# Patient Record
Sex: Female | Born: 1935 | Race: Black or African American | Hispanic: No | Marital: Married | Smoking: Never smoker
Health system: Southern US, Community
[De-identification: ages and names within clinical notes are randomized; demographics above are authoritative.]

## PROBLEM LIST (undated history)

## (undated) ENCOUNTER — Emergency Department (HOSPITAL_COMMUNITY): Admission: EM | Discharge status: Absent without leave | Payer: Self-pay | Source: Home / Self Care

## (undated) DIAGNOSIS — E119 Type 2 diabetes mellitus without complications: Secondary | ICD-10-CM

## (undated) DIAGNOSIS — R0609 Other forms of dyspnea: Secondary | ICD-10-CM

## (undated) DIAGNOSIS — G4733 Obstructive sleep apnea (adult) (pediatric): Secondary | ICD-10-CM

## (undated) DIAGNOSIS — I878 Other specified disorders of veins: Secondary | ICD-10-CM

## (undated) DIAGNOSIS — M1990 Unspecified osteoarthritis, unspecified site: Secondary | ICD-10-CM

## (undated) DIAGNOSIS — R011 Cardiac murmur, unspecified: Secondary | ICD-10-CM

## (undated) DIAGNOSIS — N183 Chronic kidney disease, stage 3 unspecified: Secondary | ICD-10-CM

## (undated) DIAGNOSIS — Z8739 Personal history of other diseases of the musculoskeletal system and connective tissue: Secondary | ICD-10-CM

## (undated) DIAGNOSIS — I5032 Chronic diastolic (congestive) heart failure: Secondary | ICD-10-CM

## (undated) DIAGNOSIS — I421 Obstructive hypertrophic cardiomyopathy: Secondary | ICD-10-CM

## (undated) DIAGNOSIS — R600 Localized edema: Secondary | ICD-10-CM

## (undated) DIAGNOSIS — I10 Essential (primary) hypertension: Secondary | ICD-10-CM

## (undated) DIAGNOSIS — E785 Hyperlipidemia, unspecified: Secondary | ICD-10-CM

## (undated) DIAGNOSIS — Z8719 Personal history of other diseases of the digestive system: Secondary | ICD-10-CM

## (undated) DIAGNOSIS — Z9989 Dependence on other enabling machines and devices: Secondary | ICD-10-CM

## (undated) DIAGNOSIS — Z86718 Personal history of other venous thrombosis and embolism: Secondary | ICD-10-CM

## (undated) DIAGNOSIS — I480 Paroxysmal atrial fibrillation: Secondary | ICD-10-CM

## (undated) DIAGNOSIS — I351 Nonrheumatic aortic (valve) insufficiency: Secondary | ICD-10-CM

## (undated) DIAGNOSIS — T148XXA Other injury of unspecified body region, initial encounter: Secondary | ICD-10-CM

## (undated) DIAGNOSIS — I272 Pulmonary hypertension, unspecified: Secondary | ICD-10-CM

## (undated) DIAGNOSIS — G459 Transient cerebral ischemic attack, unspecified: Secondary | ICD-10-CM

## (undated) DIAGNOSIS — I639 Cerebral infarction, unspecified: Secondary | ICD-10-CM

## (undated) DIAGNOSIS — K219 Gastro-esophageal reflux disease without esophagitis: Secondary | ICD-10-CM

## (undated) HISTORY — DX: Other forms of dyspnea: R06.09

## (undated) HISTORY — PX: TRANSTHORACIC ECHOCARDIOGRAM: SHX275

## (undated) HISTORY — PX: EYE SURGERY: SHX253

## (undated) HISTORY — PX: TOTAL KNEE ARTHROPLASTY: SHX125

## (undated) HISTORY — PX: JOINT REPLACEMENT: SHX530

## (undated) HISTORY — PX: CATARACT EXTRACTION W/ INTRAOCULAR LENS IMPLANT: SHX1309

## (undated) HISTORY — DX: Transient cerebral ischemic attack, unspecified: G45.9

---

## 1969-03-16 HISTORY — PX: TUMOR EXCISION: SHX421

## 1997-11-19 ENCOUNTER — Emergency Department (HOSPITAL_COMMUNITY): Admission: EM | Admit: 1997-11-19 | Discharge: 1997-11-19 | Payer: Self-pay | Admitting: Emergency Medicine

## 1997-11-26 ENCOUNTER — Encounter: Admission: RE | Admit: 1997-11-26 | Discharge: 1998-02-24 | Payer: Self-pay | Admitting: Orthopedic Surgery

## 1998-05-26 ENCOUNTER — Encounter: Payer: Self-pay | Admitting: Emergency Medicine

## 1998-05-26 ENCOUNTER — Emergency Department (HOSPITAL_COMMUNITY): Admission: EM | Admit: 1998-05-26 | Discharge: 1998-05-26 | Payer: Self-pay | Admitting: Emergency Medicine

## 1999-02-20 ENCOUNTER — Other Ambulatory Visit: Admission: RE | Admit: 1999-02-20 | Discharge: 1999-02-20 | Payer: Self-pay | Admitting: Obstetrics & Gynecology

## 1999-04-05 ENCOUNTER — Encounter (INDEPENDENT_AMBULATORY_CARE_PROVIDER_SITE_OTHER): Payer: Self-pay

## 1999-04-05 ENCOUNTER — Other Ambulatory Visit: Admission: RE | Admit: 1999-04-05 | Discharge: 1999-04-05 | Payer: Self-pay | Admitting: Obstetrics & Gynecology

## 1999-04-13 ENCOUNTER — Encounter (INDEPENDENT_AMBULATORY_CARE_PROVIDER_SITE_OTHER): Payer: Self-pay

## 1999-04-13 ENCOUNTER — Ambulatory Visit (HOSPITAL_COMMUNITY): Admission: RE | Admit: 1999-04-13 | Discharge: 1999-04-13 | Payer: Self-pay | Admitting: Obstetrics & Gynecology

## 2000-07-22 ENCOUNTER — Encounter: Payer: Self-pay | Admitting: Internal Medicine

## 2000-07-22 ENCOUNTER — Encounter: Admission: RE | Admit: 2000-07-22 | Discharge: 2000-07-22 | Payer: Self-pay | Admitting: Internal Medicine

## 2000-07-28 ENCOUNTER — Emergency Department (HOSPITAL_COMMUNITY): Admission: EM | Admit: 2000-07-28 | Discharge: 2000-07-28 | Payer: Self-pay | Admitting: Emergency Medicine

## 2000-07-28 ENCOUNTER — Encounter: Payer: Self-pay | Admitting: Emergency Medicine

## 2001-08-12 ENCOUNTER — Encounter: Payer: Self-pay | Admitting: Internal Medicine

## 2001-08-12 ENCOUNTER — Encounter: Admission: RE | Admit: 2001-08-12 | Discharge: 2001-08-12 | Payer: Self-pay | Admitting: Internal Medicine

## 2001-10-23 ENCOUNTER — Encounter: Admission: RE | Admit: 2001-10-23 | Discharge: 2002-01-21 | Payer: Self-pay | Admitting: Internal Medicine

## 2001-12-10 ENCOUNTER — Encounter (HOSPITAL_BASED_OUTPATIENT_CLINIC_OR_DEPARTMENT_OTHER): Admission: RE | Admit: 2001-12-10 | Discharge: 2002-03-10 | Payer: Self-pay | Admitting: Internal Medicine

## 2002-02-27 ENCOUNTER — Encounter: Payer: Self-pay | Admitting: Orthopedic Surgery

## 2002-02-27 ENCOUNTER — Encounter: Admission: RE | Admit: 2002-02-27 | Discharge: 2002-02-27 | Payer: Self-pay | Admitting: Orthopedic Surgery

## 2002-03-12 ENCOUNTER — Encounter: Admission: RE | Admit: 2002-03-12 | Discharge: 2002-03-12 | Payer: Self-pay | Admitting: Orthopedic Surgery

## 2002-03-12 ENCOUNTER — Encounter: Payer: Self-pay | Admitting: Orthopedic Surgery

## 2002-03-16 ENCOUNTER — Encounter (HOSPITAL_BASED_OUTPATIENT_CLINIC_OR_DEPARTMENT_OTHER): Admission: RE | Admit: 2002-03-16 | Discharge: 2002-03-27 | Payer: Self-pay | Admitting: Internal Medicine

## 2002-03-26 ENCOUNTER — Encounter: Payer: Self-pay | Admitting: Orthopedic Surgery

## 2002-03-26 ENCOUNTER — Encounter: Admission: RE | Admit: 2002-03-26 | Discharge: 2002-03-26 | Payer: Self-pay | Admitting: Orthopedic Surgery

## 2002-06-22 ENCOUNTER — Encounter (HOSPITAL_BASED_OUTPATIENT_CLINIC_OR_DEPARTMENT_OTHER): Admission: RE | Admit: 2002-06-22 | Discharge: 2002-09-20 | Payer: Self-pay | Admitting: Internal Medicine

## 2002-09-28 ENCOUNTER — Encounter (HOSPITAL_BASED_OUTPATIENT_CLINIC_OR_DEPARTMENT_OTHER): Admission: RE | Admit: 2002-09-28 | Discharge: 2002-12-27 | Payer: Self-pay | Admitting: Internal Medicine

## 2002-12-30 ENCOUNTER — Encounter (HOSPITAL_BASED_OUTPATIENT_CLINIC_OR_DEPARTMENT_OTHER): Admission: RE | Admit: 2002-12-30 | Discharge: 2003-01-19 | Payer: Self-pay | Admitting: Internal Medicine

## 2003-04-22 ENCOUNTER — Encounter (HOSPITAL_BASED_OUTPATIENT_CLINIC_OR_DEPARTMENT_OTHER): Admission: RE | Admit: 2003-04-22 | Discharge: 2003-05-04 | Payer: Self-pay | Admitting: Internal Medicine

## 2003-04-23 ENCOUNTER — Other Ambulatory Visit: Admission: RE | Admit: 2003-04-23 | Discharge: 2003-04-23 | Payer: Self-pay | Admitting: Obstetrics and Gynecology

## 2003-05-11 ENCOUNTER — Ambulatory Visit (HOSPITAL_COMMUNITY): Admission: RE | Admit: 2003-05-11 | Discharge: 2003-05-11 | Payer: Self-pay | Admitting: Otolaryngology

## 2003-05-22 ENCOUNTER — Ambulatory Visit (HOSPITAL_COMMUNITY): Admission: RE | Admit: 2003-05-22 | Discharge: 2003-05-22 | Payer: Self-pay | Admitting: Obstetrics and Gynecology

## 2003-06-19 ENCOUNTER — Emergency Department (HOSPITAL_COMMUNITY): Admission: AD | Admit: 2003-06-19 | Discharge: 2003-06-19 | Payer: Self-pay | Admitting: Internal Medicine

## 2003-08-05 ENCOUNTER — Encounter (HOSPITAL_BASED_OUTPATIENT_CLINIC_OR_DEPARTMENT_OTHER): Admission: RE | Admit: 2003-08-05 | Discharge: 2003-08-17 | Payer: Self-pay | Admitting: Internal Medicine

## 2004-01-19 HISTORY — PX: CARDIAC CATHETERIZATION: SHX172

## 2005-05-25 ENCOUNTER — Encounter: Admission: RE | Admit: 2005-05-25 | Discharge: 2005-05-25 | Payer: Self-pay | Admitting: Orthopaedic Surgery

## 2005-06-13 ENCOUNTER — Encounter (INDEPENDENT_AMBULATORY_CARE_PROVIDER_SITE_OTHER): Payer: Self-pay | Admitting: Specialist

## 2005-06-13 ENCOUNTER — Ambulatory Visit (HOSPITAL_COMMUNITY): Admission: RE | Admit: 2005-06-13 | Discharge: 2005-06-13 | Payer: Self-pay | Admitting: Obstetrics and Gynecology

## 2006-01-14 ENCOUNTER — Emergency Department (HOSPITAL_COMMUNITY): Admission: EM | Admit: 2006-01-14 | Discharge: 2006-01-14 | Payer: Self-pay | Admitting: Emergency Medicine

## 2006-01-15 ENCOUNTER — Ambulatory Visit (HOSPITAL_COMMUNITY): Admission: RE | Admit: 2006-01-15 | Discharge: 2006-01-15 | Payer: Self-pay | Admitting: Emergency Medicine

## 2006-01-15 ENCOUNTER — Encounter: Payer: Self-pay | Admitting: Vascular Surgery

## 2006-08-02 ENCOUNTER — Ambulatory Visit (HOSPITAL_COMMUNITY): Admission: RE | Admit: 2006-08-02 | Discharge: 2006-08-02 | Payer: Self-pay | Admitting: Orthopedic Surgery

## 2007-01-31 ENCOUNTER — Other Ambulatory Visit: Admission: RE | Admit: 2007-01-31 | Discharge: 2007-01-31 | Payer: Self-pay | Admitting: Obstetrics and Gynecology

## 2008-11-22 ENCOUNTER — Encounter: Admission: RE | Admit: 2008-11-22 | Discharge: 2008-11-22 | Payer: Self-pay | Admitting: Specialist

## 2009-12-03 ENCOUNTER — Observation Stay (HOSPITAL_COMMUNITY): Admission: EM | Admit: 2009-12-03 | Discharge: 2009-12-03 | Payer: Self-pay | Admitting: Emergency Medicine

## 2009-12-03 ENCOUNTER — Encounter (INDEPENDENT_AMBULATORY_CARE_PROVIDER_SITE_OTHER): Payer: Self-pay | Admitting: Emergency Medicine

## 2009-12-03 ENCOUNTER — Ambulatory Visit: Payer: Self-pay | Admitting: Vascular Surgery

## 2010-01-04 ENCOUNTER — Emergency Department (HOSPITAL_COMMUNITY): Admission: EM | Admit: 2010-01-04 | Discharge: 2010-01-05 | Payer: Self-pay | Admitting: Emergency Medicine

## 2010-08-06 ENCOUNTER — Encounter: Payer: Self-pay | Admitting: Orthopedic Surgery

## 2010-08-07 ENCOUNTER — Encounter: Payer: Self-pay | Admitting: Orthopedic Surgery

## 2010-10-01 LAB — CBC
HCT: 36.5 % (ref 36.0–46.0)
Hemoglobin: 12.1 g/dL (ref 12.0–15.0)
MCH: 29.2 pg (ref 26.0–34.0)
MCHC: 33.1 g/dL (ref 30.0–36.0)
MCV: 88.2 fL (ref 78.0–100.0)
Platelets: 187 10*3/uL (ref 150–400)
RBC: 4.14 MIL/uL (ref 3.87–5.11)
RDW: 15.6 % — ABNORMAL HIGH (ref 11.5–15.5)
WBC: 15.3 10*3/uL — ABNORMAL HIGH (ref 4.0–10.5)

## 2010-10-01 LAB — URINE CULTURE: Colony Count: 85000

## 2010-10-01 LAB — DIFFERENTIAL
Basophils Absolute: 0 10*3/uL (ref 0.0–0.1)
Basophils Relative: 0 % (ref 0–1)
Eosinophils Absolute: 0.3 10*3/uL (ref 0.0–0.7)
Eosinophils Relative: 2 % (ref 0–5)
Lymphocytes Relative: 6 % — ABNORMAL LOW (ref 12–46)
Lymphs Abs: 0.9 10*3/uL (ref 0.7–4.0)
Monocytes Absolute: 0.4 10*3/uL (ref 0.1–1.0)
Monocytes Relative: 2 % — ABNORMAL LOW (ref 3–12)
Neutro Abs: 13.7 10*3/uL — ABNORMAL HIGH (ref 1.7–7.7)
Neutrophils Relative %: 90 % — ABNORMAL HIGH (ref 43–77)

## 2010-10-01 LAB — BASIC METABOLIC PANEL
BUN: 18 mg/dL (ref 6–23)
CO2: 29 mEq/L (ref 19–32)
Calcium: 8.9 mg/dL (ref 8.4–10.5)
Chloride: 104 mEq/L (ref 96–112)
Creatinine, Ser: 1.41 mg/dL — ABNORMAL HIGH (ref 0.4–1.2)
GFR calc Af Amer: 44 mL/min — ABNORMAL LOW (ref >60)
GFR calc non Af Amer: 36 mL/min — ABNORMAL LOW (ref >60)
Glucose, Bld: 201 mg/dL — ABNORMAL HIGH (ref 70–99)
Potassium: 3.7 mEq/L (ref 3.5–5.1)
Sodium: 141 mEq/L (ref 135–145)

## 2010-10-01 LAB — POCT CARDIAC MARKERS
CKMB, poc: 2.3 ng/mL (ref 1.0–8.0)
Myoglobin, poc: 188 ng/mL (ref 12–200)
Troponin i, poc: <0.05 ng/mL (ref 0.00–0.09)

## 2010-10-01 LAB — URINALYSIS, ROUTINE W REFLEX MICROSCOPIC
Bilirubin Urine: NEGATIVE
Glucose, UA: NEGATIVE mg/dL
Ketones, ur: NEGATIVE mg/dL
Nitrite: NEGATIVE
Protein, ur: 100 mg/dL — AB
Specific Gravity, Urine: 1.01 (ref 1.005–1.030)
Urobilinogen, UA: 1 mg/dL (ref 0.0–1.0)
pH: 6.5 (ref 5.0–8.0)

## 2010-10-01 LAB — URINE MICROSCOPIC-ADD ON

## 2010-10-01 LAB — BRAIN NATRIURETIC PEPTIDE: Pro B Natriuretic peptide (BNP): 373 pg/mL — ABNORMAL HIGH (ref 0.0–100.0)

## 2010-10-02 LAB — BASIC METABOLIC PANEL
BUN: 19 mg/dL (ref 6–23)
CO2: 32 mEq/L (ref 19–32)
Calcium: 9.1 mg/dL (ref 8.4–10.5)
Chloride: 102 mEq/L (ref 96–112)
Creatinine, Ser: 1.26 mg/dL — ABNORMAL HIGH (ref 0.4–1.2)
GFR calc Af Amer: 50 mL/min — ABNORMAL LOW (ref >60)
GFR calc non Af Amer: 42 mL/min — ABNORMAL LOW (ref >60)
Glucose, Bld: 125 mg/dL — ABNORMAL HIGH (ref 70–99)
Potassium: 4.1 mEq/L (ref 3.5–5.1)
Sodium: 139 mEq/L (ref 135–145)

## 2010-10-02 LAB — CBC
HCT: 40.4 % (ref 36.0–46.0)
Hemoglobin: 13.4 g/dL (ref 12.0–15.0)
MCHC: 33.2 g/dL (ref 30.0–36.0)
MCV: 86.7 fL (ref 78.0–100.0)
Platelets: 260 10*3/uL (ref 150–400)
RBC: 4.66 MIL/uL (ref 3.87–5.11)
RDW: 15.5 % (ref 11.5–15.5)
WBC: 9.1 10*3/uL (ref 4.0–10.5)

## 2010-10-02 LAB — DIFFERENTIAL
Basophils Absolute: 0 10*3/uL (ref 0.0–0.1)
Basophils Relative: 0 % (ref 0–1)
Eosinophils Absolute: 0.2 10*3/uL (ref 0.0–0.7)
Eosinophils Relative: 3 % (ref 0–5)
Lymphocytes Relative: 19 % (ref 12–46)
Lymphs Abs: 1.7 10*3/uL (ref 0.7–4.0)
Monocytes Absolute: 0.7 10*3/uL (ref 0.1–1.0)
Monocytes Relative: 8 % (ref 3–12)
Neutro Abs: 6.4 10*3/uL (ref 1.7–7.7)
Neutrophils Relative %: 70 % (ref 43–77)

## 2010-10-02 LAB — APTT: aPTT: 27 seconds (ref 24–37)

## 2010-10-02 LAB — PROTIME-INR
INR: 1.12 (ref 0.00–1.49)
Prothrombin Time: 14.3 seconds (ref 11.6–15.2)

## 2010-11-30 DIAGNOSIS — R0600 Dyspnea, unspecified: Secondary | ICD-10-CM

## 2010-11-30 DIAGNOSIS — R0609 Other forms of dyspnea: Secondary | ICD-10-CM

## 2010-11-30 HISTORY — DX: Dyspnea, unspecified: R06.00

## 2010-11-30 HISTORY — DX: Other forms of dyspnea: R06.09

## 2010-12-01 NOTE — H&P (Signed)
[name], [name]                 ACCOUNT NO.:  [id number]   MEDICAL RECORD NO.:  [id number]          PATIENT TYPE:  AMB   LOCATION:  [facility]C                           FACILITY:  [facility]   PHYSICIAN:  James A. Ashley Royalty, M.D.DATE OF BIRTH:  1934/04/10   DATE OF ADMISSION:  06/13/2005  DATE OF DISCHARGE:                                HISTORY & PHYSICAL   HISTORY OF PRESENT ILLNESS:  The patient is a 77 year old black female with  a history of endometrial hyperplasia and an endometrial polyp.  The patient  was scheduled to have dilatation and curettage and hysteroscopy on or about  July 2005 but had to cancel due to heart problems.  Her internal medicine  physician is Dr. Barbee Shropshire.  Other medical problems include diabetes mellitus  type 2, hypertension, and orthopedic issues including stenosis in the L4-5  region. She states her cardiac issues are stable and she has been cleared  for surgery.   MEDICATIONS:  1.  Amaryl.  2.  Norvasc 5 mg daily.  3.  Hyzaar 100/25 one daily.  4.  Furosemide 40 mg one-half tablet daily.  5.  Potassium.  6.  Toprol-XL 50 mg daily.  7.  Nexium 40 mg daily.  8.  Bayer aspirin 325 mg daily.   PAST MEDICAL HISTORY:  As above.   PAST SURGICAL HISTORY:  Three knee replacements.   ALLERGIES:  None disclosed.   FAMILY HISTORY:  Positive for hypertension and diabetes.   SOCIAL HISTORY:  The patient denies use of tobacco or alcohol.   REVIEW OF SYSTEMS:  Noncontributory.   PHYSICAL EXAMINATION:  GENERAL:  Well-developed, well-nourished, pleasant  black female in no acute distress.  VITAL SIGNS:  Afebrile.  Vital signs stable.  CHEST:  Lungs are clear.  CARDIAC:  Regular rate and rhythm.  ABDOMEN:  Soft and nontender without masses or organomegaly.  Bowel sounds  active.  MUSCULOSKELETAL:  The patient has limited range of motion in the right lower  extremity.  PELVIC:  (Seen May 29, 2005).  External genitalia within normal limits.  Vagina and cervix  without mass lesions.  Bimanual examination reveals the  uterus to be approximately 6 x 4 x 4 cm.  There is a left adnexal mass felt  to represent a pedunculated fibroid.   ACCESSORY FINDINGS:  Ultrasound May 06, 2003 revealed a solid mass in  the left adnexa consistent with a pedunculated fibroid.   IMPRESSION:  1.  Probable fibroid uterus.  2.  Endometrial polyp.  3.  History of endometrial hyperplasia.  4.  Diabetes mellitus, type 2.  5.  Hypertension.  6.  Stenosis in the L4-5 region of the spine.  7.  History of heart problems, exact diagnosis unknown.  Currently under the      care of Dr. Barbee Shropshire.  The patient alleges that her heart is fine at      this time and she has been cleared for surgery.   PLAN:  1.  Diagnostic/operative hysteroscopy.  2.  Dilatation and curettage.   The risks, benefits, complications, and alternatives were fully discussed  with the patient.  She understands and accepts.  Questions invited and  answered.      James A. Ashley Royalty, M.D.  Electronically Signed     JAM/MEDQ  D:  06/13/2005  T:  06/13/2005  Job:  [id number]

## 2010-12-01 NOTE — Op Note (Signed)
NAMELUCIE, [name]                 ACCOUNT NO.:  [id number]   MEDICAL RECORD NO.:  [id number]          PATIENT TYPE:  AMB   LOCATION:  SDC                           FACILITY:  [facility]   PHYSICIAN:  James A. Ashley Royalty, M.D.DATE OF BIRTH:  April 05, 1934   DATE OF PROCEDURE:  06/13/2005  DATE OF DISCHARGE:                                 OPERATIVE REPORT   PREOPERATIVE DIAGNOSES:  1.  History of endometrial hyperplasia.  2.  Possible endometrial polyp.   POSTOPERATIVE DIAGNOSES:  1.  History of endometrial hyperplasia.  2.  Possible endometrial polyp.  3.  Pathology pending.   PROCEDURES:  1.  Diagnostic hysteroscopy.  2.  Dilatation and curettage.   SURGEON:  Rudy Jew. Ashley Royalty, M.D.   ANESTHESIA:  General.   ESTIMATED BLOOD LOSS:  Less than 25 mL.   COMPLICATIONS:  None.   PACKS AND DRAINS:  None.   PROCEDURE:  The patient was taken to the operating room and placed in the  dorsal supine position.  Her feet were placed in the appropriate stirrups  while she was awake in deference of her history of orthopedic problems.  She  was then given a general anesthetic and prepped and draped in the usual  manner for vaginal surgery.  A posterior weighted retractor was placed per  vagina.  The anterior lip of cervix was grasped with a single-tooth  tenaculum.  The uterus was gently sounded to approximately 8 cm.  The cervix  was then dilated to a size 29-French using Shawnie Pons dilators.  A resectoscope  was placed into the uterine cavity using sorbitol as distension medium.  The  tubal ostia were visualized bilaterally.  The anterior and posterior aspects  of the uterine cavity were noted to be clean.  There was no obvious evidence  of any fibroids or polyps.  There was a small strip of endometrium versus a  polyp noted free and the cavity, and this was removed and submitted  separately to pathology for histologic studies.  Appropriate photos were  obtained.   Attention was then turned to the  uterine curettage.  A medium-sized curette  was introduced into the uterine cavity and a four-quadrant technique was  initially used for curettage.  Then a therapeutic technique was used.  All  curettings were submitted to pathology for histologic studies.  There was  minimal tissue obtained.  At this point hemostasis was noted and the  procedure terminated.  There was a small cervical laceration on the  tenaculum site,  which was easily closed with an interrupted suture of 2-0 chromic catgut.  Hemostasis was noted and the procedure terminated.   The patient was taken to the recovery room in excellent condition.      James A. Ashley Royalty, M.D.  Electronically Signed     JAM/MEDQ  D:  06/13/2005  T:  06/13/2005  Job:  [id number]

## 2011-02-23 ENCOUNTER — Inpatient Hospital Stay (INDEPENDENT_AMBULATORY_CARE_PROVIDER_SITE_OTHER)
Admission: RE | Admit: 2011-02-23 | Discharge: 2011-02-23 | Disposition: A | Discharge status: Home / Self Care | Payer: Medicare Other | Source: Ambulatory Visit | Attending: Family Medicine | Admitting: Family Medicine

## 2011-02-23 DIAGNOSIS — I872 Venous insufficiency (chronic) (peripheral): Secondary | ICD-10-CM

## 2011-02-23 DIAGNOSIS — I83009 Varicose veins of unspecified lower extremity with ulcer of unspecified site: Secondary | ICD-10-CM

## 2011-03-05 ENCOUNTER — Encounter (HOSPITAL_BASED_OUTPATIENT_CLINIC_OR_DEPARTMENT_OTHER): Payer: Medicare Other | Attending: Internal Medicine

## 2011-03-05 DIAGNOSIS — K219 Gastro-esophageal reflux disease without esophagitis: Secondary | ICD-10-CM | POA: Insufficient documentation

## 2011-03-05 DIAGNOSIS — E119 Type 2 diabetes mellitus without complications: Secondary | ICD-10-CM | POA: Insufficient documentation

## 2011-03-05 DIAGNOSIS — I872 Venous insufficiency (chronic) (peripheral): Secondary | ICD-10-CM | POA: Insufficient documentation

## 2011-03-05 DIAGNOSIS — Z79899 Other long term (current) drug therapy: Secondary | ICD-10-CM | POA: Insufficient documentation

## 2011-03-05 DIAGNOSIS — Z86718 Personal history of other venous thrombosis and embolism: Secondary | ICD-10-CM | POA: Insufficient documentation

## 2011-03-05 DIAGNOSIS — I82509 Chronic embolism and thrombosis of unspecified deep veins of unspecified lower extremity: Secondary | ICD-10-CM | POA: Insufficient documentation

## 2011-03-05 DIAGNOSIS — L97809 Non-pressure chronic ulcer of other part of unspecified lower leg with unspecified severity: Secondary | ICD-10-CM | POA: Insufficient documentation

## 2011-03-06 NOTE — Progress Notes (Unsigned)
[facility]  NAMEJAHNAI, [name]                 ACCOUNT NO.:  [id number]  MEDICAL RECORD NO.:  [id number]      DATE OF BIRTH:  [birth year]  PHYSICIAN:  Jonelle Sports. Sidnee Gambrill, M.D.  VISIT DATE:  03/05/2011                                  OFFICE VISIT   HISTORY:  This 77 year old black female is seen for assistance with management of 3 blistered areas on the distal posterior aspect of the right lower extremity.  The patient apparently has had venous thrombosis in the past.  She is unsure as to whether that is a single extremity or both, but remarkably has been anticoagulated recently and has had no particular difficulties with edema or chronic venous insufficiency or with ulcerations.  Without background history, however, she has had some recent fluid retention in general and noticed that it seemed to focus more on the right lower extremity and she developed 3 weeping blistered areas on the posterior aspect of that extremity sort of in the Achilles tendon area.  She went to the emergency room and received some temporizing treatment and her primary physician then sent her here for our evaluation.  PAST MEDICAL HISTORY:  Operations include bilateral knee replacements 1988, 2000 and 2002; right cataract surgery in 2011.  Other hospitalizations have been in association with her recurrent DVT.  She has no known medicinal allergies.  Her regular medications include: 1. Amlodipine 5 mg daily. 2. Clonidine 0.2 mg b.i.d. 3. Furosemide 60 mg daily. 4. Fish oil 1200 mg twice daily. 5. Glimepiride 5 mg twice daily. 6. Omeprazole 20 mg daily. 7. Benicar 40 mg daily. 8. Silvadene ointment 1% which she had been putting on these ulcerated     areas twice daily and she also takes a dietary supplement on a     daily basis. 9. She currently is also on doxycycline 100 mg b.i.d., although I have     no evidence of a culture or culture results from a recent     evaluation.  She  apparently has 3 or 4 more days of that to     continue.  FAMILY HISTORY:  Not reviewed in detail, but apparently was negative for problems similar to what the patient is having at the present.  PERSONAL HISTORY:  The patient is married, lives with her husband is able to take care of her personal needs and requires no assistance with cooking, shopping and personal care, etc.  She does not smoke or use alcohol or recreational beverages.  She is currently not working.  REVIEW OF SYSTEMS:  The patient has had diabetes for some 15-20 years, obviously takes low-dose medication, checks her sugars about once daily and it runs approximately 130 mg/dL.  She is unaware of any diabetic kidney or eye disease, certainly has not been on renal dialysis.  She has no history of cancer, radiation or chemotherapy.  She does have some gastroesophageal reflux which is well controlled with her current medication.  She also has hypertension which likewise is said to be well controlled with medication and she has no history of GI bleeding.  No thyroid disorders.  No anemia or other blood disorders.  ADDENDUM:  The patient did have venous Dopplers when she was seen in [place] earlier  this month and had no evidence of new thrombotic problems in her venous system.  PHYSICAL EXAMINATION:  Blood pressure is 157/90, pulse is 72 and regular, respirations 20 and temperature 97.6.  This is a pleasant elderly slightly overweight black female, in no immediate distress, quite alert and cooperative and in no pain at the moment.  Her skin is warm and dry and a good turgor.  Her mucous membranes are moist and pink.  Her chest is grossly clear to auscultation.  Her heart tones are of good quality.  There is a soft S4 heard at the lower sternal area. Her abdomen is obese, but nontender and I can detect no organomegaly. Her extremities show no edema on the left with pulses easily palpable on the left approximately 2+  pitting edema to the knee and above on the right making difficult to palpate her pulse, but on handheld Doppler examination, she has a biphasic signal at both the dorsalis pedis and posterior tibial areas.  On the posterior aspect of that lower extremity, there are several areas, where there have been previous blisters, two of those are partially epithelialized.  The third measures 2.0 x 1.6 x 0.1 cm.  There is no visible drainage, no odor, no evidence of infection.  IMPRESSION:  Deep vein thrombosis, chronic and remote, with acute stasis ulceration of the posterior right lower extremity.  DISPOSITION: 1. The patients' wounds require no debridement. 2. They are treated today with an application of silver collagen and     that extremities placed in an Unna wrap to the knee.  The patient is educated in how to deal with Unna boot and when it comes to bathing an issue such as that and understands that we do not want it removed prior to her visit here in 1 week.  She is advised that should it slip down, she can call and come in and be rewrapped at any time. Meanwhile, as stated above her next visit here will be in 1 week.          ______________________________ Jonelle Sports. Cheryll Cockayne, M.D.     RES/MEDQ  D:  03/05/2011  T:  03/06/2011  Job:  [id number]

## 2011-03-21 ENCOUNTER — Encounter (HOSPITAL_BASED_OUTPATIENT_CLINIC_OR_DEPARTMENT_OTHER): Payer: Medicare Other | Attending: Internal Medicine

## 2011-03-21 DIAGNOSIS — L97809 Non-pressure chronic ulcer of other part of unspecified lower leg with unspecified severity: Secondary | ICD-10-CM | POA: Insufficient documentation

## 2011-03-21 DIAGNOSIS — I872 Venous insufficiency (chronic) (peripheral): Secondary | ICD-10-CM | POA: Insufficient documentation

## 2011-03-21 DIAGNOSIS — K219 Gastro-esophageal reflux disease without esophagitis: Secondary | ICD-10-CM | POA: Insufficient documentation

## 2011-03-21 DIAGNOSIS — I82509 Chronic embolism and thrombosis of unspecified deep veins of unspecified lower extremity: Secondary | ICD-10-CM | POA: Insufficient documentation

## 2011-03-21 DIAGNOSIS — Z79899 Other long term (current) drug therapy: Secondary | ICD-10-CM | POA: Insufficient documentation

## 2011-03-21 DIAGNOSIS — Z86718 Personal history of other venous thrombosis and embolism: Secondary | ICD-10-CM | POA: Insufficient documentation

## 2011-03-21 DIAGNOSIS — E119 Type 2 diabetes mellitus without complications: Secondary | ICD-10-CM | POA: Insufficient documentation

## 2011-05-14 ENCOUNTER — Encounter (HOSPITAL_BASED_OUTPATIENT_CLINIC_OR_DEPARTMENT_OTHER): Payer: Medicare Other | Attending: Internal Medicine

## 2011-05-14 DIAGNOSIS — L97809 Non-pressure chronic ulcer of other part of unspecified lower leg with unspecified severity: Secondary | ICD-10-CM | POA: Insufficient documentation

## 2011-05-14 DIAGNOSIS — Z86718 Personal history of other venous thrombosis and embolism: Secondary | ICD-10-CM | POA: Insufficient documentation

## 2011-05-14 DIAGNOSIS — E119 Type 2 diabetes mellitus without complications: Secondary | ICD-10-CM | POA: Insufficient documentation

## 2011-05-14 DIAGNOSIS — K219 Gastro-esophageal reflux disease without esophagitis: Secondary | ICD-10-CM | POA: Insufficient documentation

## 2011-05-14 DIAGNOSIS — I10 Essential (primary) hypertension: Secondary | ICD-10-CM | POA: Insufficient documentation

## 2011-05-14 DIAGNOSIS — Z79899 Other long term (current) drug therapy: Secondary | ICD-10-CM | POA: Insufficient documentation

## 2011-05-14 DIAGNOSIS — I872 Venous insufficiency (chronic) (peripheral): Secondary | ICD-10-CM | POA: Insufficient documentation

## 2011-05-21 ENCOUNTER — Encounter (HOSPITAL_BASED_OUTPATIENT_CLINIC_OR_DEPARTMENT_OTHER): Payer: Medicare Other | Attending: Internal Medicine

## 2011-05-21 DIAGNOSIS — I10 Essential (primary) hypertension: Secondary | ICD-10-CM | POA: Insufficient documentation

## 2011-05-21 DIAGNOSIS — L97809 Non-pressure chronic ulcer of other part of unspecified lower leg with unspecified severity: Secondary | ICD-10-CM | POA: Insufficient documentation

## 2011-05-21 DIAGNOSIS — E119 Type 2 diabetes mellitus without complications: Secondary | ICD-10-CM | POA: Insufficient documentation

## 2011-05-21 DIAGNOSIS — K219 Gastro-esophageal reflux disease without esophagitis: Secondary | ICD-10-CM | POA: Insufficient documentation

## 2011-05-21 DIAGNOSIS — Z79899 Other long term (current) drug therapy: Secondary | ICD-10-CM | POA: Insufficient documentation

## 2011-05-21 DIAGNOSIS — I872 Venous insufficiency (chronic) (peripheral): Secondary | ICD-10-CM | POA: Insufficient documentation

## 2011-05-21 DIAGNOSIS — Z86718 Personal history of other venous thrombosis and embolism: Secondary | ICD-10-CM | POA: Insufficient documentation

## 2011-06-25 ENCOUNTER — Encounter (HOSPITAL_BASED_OUTPATIENT_CLINIC_OR_DEPARTMENT_OTHER): Payer: Medicare Other | Attending: Internal Medicine

## 2011-06-25 ENCOUNTER — Ambulatory Visit (HOSPITAL_BASED_OUTPATIENT_CLINIC_OR_DEPARTMENT_OTHER): Payer: Medicare Other

## 2011-06-25 DIAGNOSIS — Z79899 Other long term (current) drug therapy: Secondary | ICD-10-CM | POA: Insufficient documentation

## 2011-06-25 DIAGNOSIS — I872 Venous insufficiency (chronic) (peripheral): Secondary | ICD-10-CM | POA: Insufficient documentation

## 2011-06-25 DIAGNOSIS — L97809 Non-pressure chronic ulcer of other part of unspecified lower leg with unspecified severity: Secondary | ICD-10-CM | POA: Insufficient documentation

## 2011-06-25 DIAGNOSIS — R609 Edema, unspecified: Secondary | ICD-10-CM | POA: Insufficient documentation

## 2011-07-19 ENCOUNTER — Telehealth (HOSPITAL_COMMUNITY): Payer: Self-pay | Admitting: *Deleted

## 2011-07-19 ENCOUNTER — Emergency Department (INDEPENDENT_AMBULATORY_CARE_PROVIDER_SITE_OTHER)
Admission: EM | Admit: 2011-07-19 | Discharge: 2011-07-19 | Disposition: A | Discharge status: Home / Self Care | Payer: Medicare Other | Source: Home / Self Care | Attending: Family Medicine | Admitting: Family Medicine

## 2011-07-19 DIAGNOSIS — J069 Acute upper respiratory infection, unspecified: Secondary | ICD-10-CM

## 2011-07-19 DIAGNOSIS — R609 Edema, unspecified: Secondary | ICD-10-CM

## 2011-07-19 DIAGNOSIS — R600 Localized edema: Secondary | ICD-10-CM

## 2011-07-19 HISTORY — DX: Essential (primary) hypertension: I10

## 2011-07-19 MED ORDER — AZITHROMYCIN 250 MG PO TABS: 250.0000 mg | ORAL_TABLET | Freq: Every day | ORAL | Status: AC

## 2011-07-19 MED ORDER — BENZONATATE 100 MG PO CAPS: 100.0000 mg | ORAL_CAPSULE | Freq: Three times a day (TID) | ORAL | Status: AC

## 2011-07-19 NOTE — ED Notes (Signed)
C/o sneezing and productive cough of yellow sputum since 07/09/12.  Denies fever or other sx.  Also c/o redness and swelling to rt lower leg for 1 week.  Pt has on-going hx of blisters/wounds to rt lower leg.  States she was discharged from wound center the first of December.  Reports in the last week she developed blister to posterior aspect of RLE and she developed a break in the skin to anterior aspect of RLE around where previous wound was.  Additionally she reports swelling and redness to RLE in the past week. Has appt with [facility] Center on Monday.

## 2011-07-19 NOTE — ED Provider Notes (Signed)
History     CSN: [id number]  Arrival date & time 07/19/11  0910   First MD Initiated Contact with Patient 07/19/11 1005      Chief Complaint  Patient presents with  . Leg Swelling  . Cough    (Consider location/radiation/quality/duration/timing/severity/associated sxs/prior treatment) Patient is a 78 y.o. female presenting with cough. The history is provided by the patient.  Cough This is a new problem. The current episode started more than 1 week ago. The problem occurs constantly. The problem has not changed since onset.The cough is non-productive. There has been no fever. Associated symptoms include rhinorrhea. Pertinent negatives include no chills, no sweats, no ear pain, no sore throat, no shortness of breath and no wheezing. She has tried nothing for the symptoms.  She also reports increased swelling of her right lower extrem. States she normally has swelling and takes lasix. She has recurring sores and is a patient of the wound clinic. She has appt Monday. She states she has been takng 40 mg of lasix daily with minimal relief.   Past Medical History  Diagnosis Date  . Diabetes mellitus   . Hypertension   . Gout   . High cholesterol   . DVT (deep venous thrombosis)     Past Surgical History  Procedure Date  . Total knee arthroplasty     History reviewed. No pertinent family history.  History  Substance Use Topics  . Smoking status: Never Smoker   . Smokeless tobacco: Not on file  . Alcohol Use: No    OB History    Grav Para Term Preterm Abortions TAB SAB Ect Mult Living                  Review of Systems  Constitutional: Negative for fever and chills.  HENT: Positive for rhinorrhea and postnasal drip. Negative for ear pain, nosebleeds, sore throat, neck pain and neck stiffness.   Respiratory: Positive for cough. Negative for shortness of breath and wheezing.   Gastrointestinal: Negative.   Skin: Negative.     Allergies  Review of patient's allergies  indicates no known allergies.  Home Medications   Current Outpatient Rx  Name Route Sig Dispense Refill  . ALLOPURINOL 300 MG PO TABS Oral Take 300 mg by mouth daily.      . ASPIRIN 81 MG PO TABS Oral Take 160 mg by mouth daily.      Marland Kitchen CLONIDINE HCL 0.2 MG PO TABS Oral Take 0.2 mg by mouth 2 (two) times daily.      . FUROSEMIDE 40 MG PO TABS Oral Take 40 mg by mouth daily.      Marland Kitchen GLIMEPIRIDE 4 MG PO TABS Oral Take 4 mg by mouth daily before breakfast.      . NABUMETONE 500 MG PO TABS Oral Take 500 mg by mouth 2 (two) times daily.      Marland Kitchen OLMESARTAN MEDOXOMIL-HCTZ 40-25 MG PO TABS Oral Take 1 tablet by mouth daily.      Marland Kitchen OMEPRAZOLE 20 MG PO CPDR Oral Take 20 mg by mouth daily.      Marland Kitchen SIMVASTATIN 40 MG PO TABS Oral Take 40 mg by mouth every evening.      . AZITHROMYCIN 250 MG PO TABS Oral Take 1 tablet (250 mg total) by mouth daily. Take first 2 tablets together, then 1 every day until finished. 6 tablet 0  . BENZONATATE 100 MG PO CAPS Oral Take 1 capsule (100 mg total) by mouth every 8 (  eight) hours. 21 capsule 0    BP 177/69  Pulse 82  Temp 98.2 F (36.8 C)  Resp 24  SpO2 98%  Physical Exam  Nursing note and vitals reviewed. Constitutional: She appears well-developed and well-nourished. No distress.  HENT:  Head: Normocephalic and atraumatic.  Mouth/Throat: Oropharynx is clear and moist.       Nasal congestion  Neck: Normal range of motion. Neck supple. No thyromegaly present.  Cardiovascular: Normal rate and regular rhythm.   Pulmonary/Chest: Breath sounds normal. No respiratory distress. She has no wheezes.       Congested cough  Musculoskeletal: She exhibits edema.       Slightly mor on the right lower extrem  Lymphadenopathy:    She has no cervical adenopathy.  Skin: Skin is warm and dry.    ED Course  Procedures (including critical care time)  Labs Reviewed - No data to display No results found.   1. URI (upper respiratory infection)   2. Edema of lower  extremity       MDM          Randa Spike, MD 07/19/11 [phone]

## 2011-07-19 NOTE — ED Notes (Signed)
Went in to discharge pt but not in room.  Went to lobby to look for her- Rams states pt left.  Called pt's home phone and left message to return our call.  Will hold pts chart to see if she calls back.

## 2011-07-23 ENCOUNTER — Encounter (HOSPITAL_BASED_OUTPATIENT_CLINIC_OR_DEPARTMENT_OTHER): Payer: Medicare Other | Attending: Internal Medicine

## 2011-07-23 DIAGNOSIS — I872 Venous insufficiency (chronic) (peripheral): Secondary | ICD-10-CM | POA: Insufficient documentation

## 2011-07-23 DIAGNOSIS — K219 Gastro-esophageal reflux disease without esophagitis: Secondary | ICD-10-CM | POA: Insufficient documentation

## 2011-07-23 DIAGNOSIS — Z79899 Other long term (current) drug therapy: Secondary | ICD-10-CM | POA: Insufficient documentation

## 2011-07-23 DIAGNOSIS — E119 Type 2 diabetes mellitus without complications: Secondary | ICD-10-CM | POA: Insufficient documentation

## 2011-07-23 DIAGNOSIS — Z86718 Personal history of other venous thrombosis and embolism: Secondary | ICD-10-CM | POA: Insufficient documentation

## 2011-07-23 DIAGNOSIS — I10 Essential (primary) hypertension: Secondary | ICD-10-CM | POA: Insufficient documentation

## 2011-07-23 DIAGNOSIS — L97809 Non-pressure chronic ulcer of other part of unspecified lower leg with unspecified severity: Secondary | ICD-10-CM | POA: Insufficient documentation

## 2011-07-30 ENCOUNTER — Encounter (HOSPITAL_BASED_OUTPATIENT_CLINIC_OR_DEPARTMENT_OTHER): Payer: Medicare Other

## 2011-07-30 ENCOUNTER — Ambulatory Visit (HOSPITAL_BASED_OUTPATIENT_CLINIC_OR_DEPARTMENT_OTHER): Payer: Medicare Other

## 2011-10-15 ENCOUNTER — Ambulatory Visit (INDEPENDENT_AMBULATORY_CARE_PROVIDER_SITE_OTHER): Payer: Medicare Other | Admitting: Internal Medicine

## 2011-10-15 ENCOUNTER — Encounter: Payer: Self-pay | Admitting: Internal Medicine

## 2011-10-15 VITALS — BP 160/84 | HR 83 | Temp 97.7°F | Ht 66.0 in | Wt 249.4 lb

## 2011-10-15 DIAGNOSIS — G4730 Sleep apnea, unspecified: Secondary | ICD-10-CM

## 2011-10-15 DIAGNOSIS — I272 Pulmonary hypertension, unspecified: Secondary | ICD-10-CM

## 2011-10-15 DIAGNOSIS — I2789 Other specified pulmonary heart diseases: Secondary | ICD-10-CM

## 2011-10-15 NOTE — Patient Instructions (Signed)
Prilosec(omeprazole)  20 mg take Take 30-60 min before first meal of the day and Pepcid ac  20 mg one at bedtime   Please see patient coordinator before you leave today  to schedule overnight pulse ox and a V/Q lung scan   GERD (REFLUX)  is an extremely common cause of respiratory symptoms, many times with no significant heartburn at all.    It can be treated with medication, but also with lifestyle changes including avoidance of late meals, excessive alcohol, smoking cessation, and avoid fatty foods, chocolate, peppermint, colas, red wine, and acidic juices such as orange juice.  NO MINT OR MENTHOL PRODUCTS SO NO COUGH DROPS  USE SUGARLESS CANDY INSTEAD (jolley ranchers or Stover's)  NO OIL BASED VITAMINS - use powdered substitutes.   Please schedule a follow up office visit in 2 weeks, sooner if needed

## 2011-10-15 NOTE — Progress Notes (Signed)
  Subjective:    Patient ID: [name], female    DOB: Sep 06, 1933 MRN: [id number]  HPI  31 yobf never with morbid obesity/ hbp/ dm with new onset sob Jan 2013 > referred 10/15/2011 to pulmonary clinic by Dr Renae Gloss with ? PAH   10/15/2011 1st pulmonary eval with wt down 50 lbs from peak of 300 with new indolent onset of doe x walking to car progressively worse since onset started abruptly assoc with leg swelling x August 2012 assoc with choking sensation while on cpap last titrated > 1 y prior to OV  Per pt. No cough or assoc cp or tightness or subjective wheeze.    Sleeping ok without nocturnal  or early am exacerbation  of respiratory  c/o's or need for noct saba. Also denies any obvious fluctuation of symptoms with weather or environmental changes or other aggravating or alleviating factors except as outlined above   No h/o liver dz/ anorexigen use or rheumatologic dz    Review of Systems  Constitutional: Negative for fever, chills and unexpected weight change.  HENT: Negative for ear pain, nosebleeds, congestion, sore throat, rhinorrhea, sneezing, trouble swallowing, dental problem, voice change, postnasal drip and sinus pressure.   Eyes: Negative for visual disturbance.  Respiratory: Positive for shortness of breath. Negative for cough and choking.   Cardiovascular: Positive for leg swelling. Negative for chest pain.  Gastrointestinal: Negative for vomiting, abdominal pain and diarrhea.  Genitourinary: Negative for difficulty urinating.  Musculoskeletal: Positive for arthralgias.  Skin: Negative for rash.  Neurological: Negative for tremors, syncope and headaches.  Hematological: Does not bruise/bleed easily.       Objective:   Physical Exam Obese very hoarse amb bf nad  Wt 249 10/15/2011    HEENT: nl dentition, turbinates, and orophanx. Nl external ear canals without cough reflex   NECK :  without JVD/Nodes/TM/ nl carotid upstrokes bilaterally   LUNGS: no acc muscle use,  clear to A and P bilaterally without cough on insp or exp maneuvers   CV:  RRR  no s3 or murmur  slt increase P2, pitting edema elastic stocking  ABD:  soft and nontender with nl excursion in the supine position. No bruits or organomegaly, bowel sounds nl  MS:  warm without deformities, calf tenderness, cyanosis or clubbing  SKIN: warm and dry without lesions    NEURO:  alert, approp, no deficits     cxr 4/1 requested, not done    Assessment & Plan:

## 2011-10-17 DIAGNOSIS — G4730 Sleep apnea, unspecified: Secondary | ICD-10-CM | POA: Insufficient documentation

## 2011-10-17 NOTE — Assessment & Plan Note (Signed)
Noct choking may be related to GERD > try diet and h2 hs  Recheck ono on cpap/ no 02 ordered.

## 2011-10-17 NOTE — Assessment & Plan Note (Signed)
Most likely this will turn out to be related to elevated L ht pressures from diast chf but need to exclude TEPH with v/q and be sure she has adequate sats on her cpap

## 2011-10-18 ENCOUNTER — Other Ambulatory Visit: Payer: Self-pay | Admitting: Internal Medicine

## 2011-10-18 ENCOUNTER — Encounter (HOSPITAL_COMMUNITY)
Admission: RE | Admit: 2011-10-18 | Discharge: 2011-10-18 | Disposition: A | Discharge status: Home / Self Care | Payer: Medicare Other | Source: Ambulatory Visit | Attending: Internal Medicine | Admitting: Internal Medicine

## 2011-10-18 ENCOUNTER — Encounter: Payer: Self-pay | Admitting: Internal Medicine

## 2011-10-18 DIAGNOSIS — I272 Pulmonary hypertension, unspecified: Secondary | ICD-10-CM

## 2011-10-18 DIAGNOSIS — I517 Cardiomegaly: Secondary | ICD-10-CM | POA: Insufficient documentation

## 2011-10-18 DIAGNOSIS — R0602 Shortness of breath: Secondary | ICD-10-CM | POA: Insufficient documentation

## 2011-10-18 DIAGNOSIS — Z86718 Personal history of other venous thrombosis and embolism: Secondary | ICD-10-CM | POA: Insufficient documentation

## 2011-10-18 DIAGNOSIS — E119 Type 2 diabetes mellitus without complications: Secondary | ICD-10-CM | POA: Insufficient documentation

## 2011-10-18 MED ORDER — TECHNETIUM TO 99M ALBUMIN AGGREGATED
3.5000 | Freq: Once | INTRAVENOUS | Status: AC | PRN
  Administered 2011-10-18: 4 via INTRAVENOUS

## 2011-10-18 NOTE — Progress Notes (Signed)
Quick Note:  Spoke with pt and notified of results per Dr. Wert. Pt verbalized understanding and denied any questions.  ______ 

## 2011-10-19 ENCOUNTER — Encounter: Payer: Self-pay | Admitting: Internal Medicine

## 2011-10-22 ENCOUNTER — Telehealth: Payer: Self-pay | Admitting: Internal Medicine

## 2011-10-22 NOTE — Telephone Encounter (Signed)
Per MW- ONO reviewed and the pt does not require o2 with her CPAP. Spoke with the pt and notified her of this and she verbalized understanding.

## 2011-10-25 ENCOUNTER — Institutional Professional Consult (permissible substitution): Payer: Medicare Other | Admitting: Emergency Medicine

## 2011-10-30 ENCOUNTER — Encounter: Payer: Self-pay | Admitting: Internal Medicine

## 2011-10-31 ENCOUNTER — Encounter: Payer: Self-pay | Admitting: Internal Medicine

## 2011-10-31 ENCOUNTER — Ambulatory Visit (INDEPENDENT_AMBULATORY_CARE_PROVIDER_SITE_OTHER): Payer: Medicare Other | Admitting: Internal Medicine

## 2011-10-31 VITALS — BP 142/80 | HR 85 | Temp 97.9°F | Ht 64.0 in | Wt 238.6 lb

## 2011-10-31 DIAGNOSIS — I272 Pulmonary hypertension, unspecified: Secondary | ICD-10-CM

## 2011-10-31 DIAGNOSIS — I2789 Other specified pulmonary heart diseases: Secondary | ICD-10-CM

## 2011-10-31 DIAGNOSIS — G4730 Sleep apnea, unspecified: Secondary | ICD-10-CM

## 2011-10-31 NOTE — Progress Notes (Signed)
  Subjective:    Patient ID: [name], female    DOB: Mar 01, 1934    MRN: [id number]  HPI  3 yobf never smoker  with morbid obesity/ hbp/ dm with new onset sob Jan 2013 > referred 10/15/2011 to pulmonary clinic by Dr Renae Gloss with ? PAH   10/15/2011 1st pulmonary eval with wt down 50 lbs from peak of 300 with new indolent onset of doe x walking to car progressively worse since onset started abruptly assoc with leg swelling x August 2012 assoc with choking sensation while on cpap last titrated > 1 y prior to OV  Per pt. No cough or assoc cp or tightness or subjective wheeze.   rec Prilosec(omeprazole)  20 mg take Take 30-60 min before first meal of the day and Pepcid ac  20 mg one at bedtime  Please see patient coordinator before you leave today  to schedule overnight pulse ox and a V/Q lung scan > low prob GERD  10/31/2011 f/u ov/Zena Vitelli cc breathing better now doe across the parking lot ok which is an improvement and attributed to less fluid in legs, diuresis per cards. No cough. Actually doing great x not tol cpap as well ? Why. When wears cpap ono is fine per study done 10/16/11.  Sleeping ok without nocturnal  or early am exacerbation  of respiratory  c/o's or need for noct saba. Also denies any obvious fluctuation of symptoms with weather or environmental changes or other aggravating or alleviating factors except as outlined above   No h/o liver dz/ anorexigen use or rheumatologic dz          Objective:   Physical Exam Obese moderately   hoarse amb bf nad  Wt 249 10/15/2011 > 10/31/2011  238   HEENT: nl dentition, turbinates, and orophanx. Nl external ear canals without cough reflex   NECK :  without JVD/Nodes/TM/ nl carotid upstrokes bilaterally   LUNGS: no acc muscle use, clear to A and P bilaterally without cough on insp or exp maneuvers   CV:  RRR  no s3 or murmur  slt increase P2, pitting edema elastic stocking  ABD:  soft and nontender with nl excursion in the supine  position. No bruits or organomegaly, bowel sounds nl  MS:  warm without deformities, calf tenderness, cyanosis or clubbing     cxr 10/18/11 Stable mild cardiomegaly. No active lung disease.  V/Q 10/18/11 Low probability for pulmonary embolus.     Assessment & Plan:

## 2011-10-31 NOTE — Patient Instructions (Signed)
Getting the fluid  And wt off are the highest priority.  If continue to have problems with your cpap your heart doctor may recommend you see a sleep specialist.  If your heart doctor wants another opinion on treatment for pulmonary hypertension he can send you to Dr Delton Coombes but as long as breathing is improving I don't recommend any medication for this.

## 2011-11-01 NOTE — Assessment & Plan Note (Addendum)
-   Echo 04/11/11 [place] >> RA with borderline rve and mod PAH per [facility]    - 10/15/2011  Walked RA x 1 laps @ 185 ft each stopped due to sob and legs gave out but no desat    - Overnight pulse ox on cpap 10/16/2011 > neg    - V/Q  10/18/2011 >  wnl  I had an extended discussion with the patient today lasting 15 to 20 minutes of a 25 minute visit on the following issues:   As most pts we see in this practice with PAH, she does not have a primary pulmonary problem at all but rather suffering from the effects of chronically elevated L Ht pressures.  Main issue is making sure she's diuresed adequately and uses cpap consistently > refer to one of our sleep docs prn as presently she identifies Dr Tresa Endo as responsible for troubleshooting her cpap issues and presumably will continue to take care of this problem until / unless he directs her otherwise.   ADDENDUM Records indicate it was [place] then [place], not [place], and last note by [facility] requested our help with osa > referred to sleep. Since she is so much better with diuresis no need for [place], who doesn't do sleep, to see her at this point

## 2011-11-01 NOTE — Progress Notes (Signed)
Addended by: Sandrea Hughs B on: 11/01/2011 09:05 AM   Modules accepted: Orders

## 2011-11-19 ENCOUNTER — Institutional Professional Consult (permissible substitution): Payer: Medicare Other | Admitting: Pulmonary Disease

## 2011-12-03 ENCOUNTER — Encounter (HOSPITAL_BASED_OUTPATIENT_CLINIC_OR_DEPARTMENT_OTHER): Payer: Medicare Other | Attending: Internal Medicine

## 2011-12-03 DIAGNOSIS — I872 Venous insufficiency (chronic) (peripheral): Secondary | ICD-10-CM | POA: Insufficient documentation

## 2011-12-03 DIAGNOSIS — L97809 Non-pressure chronic ulcer of other part of unspecified lower leg with unspecified severity: Secondary | ICD-10-CM | POA: Insufficient documentation

## 2011-12-17 ENCOUNTER — Encounter (HOSPITAL_BASED_OUTPATIENT_CLINIC_OR_DEPARTMENT_OTHER): Payer: Medicare Other | Attending: Internal Medicine

## 2011-12-17 DIAGNOSIS — L97809 Non-pressure chronic ulcer of other part of unspecified lower leg with unspecified severity: Secondary | ICD-10-CM | POA: Insufficient documentation

## 2011-12-17 DIAGNOSIS — I10 Essential (primary) hypertension: Secondary | ICD-10-CM | POA: Insufficient documentation

## 2011-12-17 DIAGNOSIS — E119 Type 2 diabetes mellitus without complications: Secondary | ICD-10-CM | POA: Insufficient documentation

## 2011-12-17 NOTE — Progress Notes (Signed)
[facility]  NAME:  [name], [name]                 ACCOUNT NO.:  [id number]  MEDICAL RECORD NO.:  [id number]      DATE OF BIRTH:  05-28-1934  PHYSICIAN:  Wayland Denis, DO       VISIT DATE:  12/17/2011                                  OFFICE VISIT   The patient is a 78 year old female who has chronic venous insufficiency with an ulcer on the right anterior lower-third leg.  She has used Unna wraps over the last week with some improvement.  The area is granulating.  There does not appear to be any sign of infection.  It is pink in the peri-wound area.  It is intact.  She does have quite a bit of swelling on that foot compared to the other one, the right leg compared to the left.  She does have a compression stocking on the left, which seems to have helped quite a bit.  Her pupils are equal.  Extraocular muscles are intact.  No cervical lymphadenopathy.  Breathing is unlabored.  Heart is regular.  The wound is as described.  We will continue with the Unna boot and a Silvercel, and if it does not start to show improvement more quickly, then we will discuss the possibility of Oasis.     Wayland Denis, DO     CS/MEDQ  D:  12/17/2011  T:  12/17/2011  Job:  [id number]

## 2011-12-31 ENCOUNTER — Encounter (HOSPITAL_BASED_OUTPATIENT_CLINIC_OR_DEPARTMENT_OTHER): Payer: Medicare Other

## 2011-12-31 NOTE — Progress Notes (Signed)
[facility]  NAME:  [name], [name]                      ACCOUNT NO.:  MEDICAL RECORD NO.:  [id number]      DATE OF BIRTH:  [birth year]  PHYSICIAN:  Wayland Denis, DO       VISIT DATE:  12/31/2011                                  OFFICE VISIT   The patient is a 78 year old female who is here for a right anterior leg ulcer, which was getting much better and was being treated with thigh- high stockings over the last week and was essentially healed. Unfortunately, this week, she developed a very large blister on the back of her right leg, approximately 15 x 13 cm.  She is not sure what caused it, but noted that when she woke up one morning, there was a small bump that developed into a very a large bump over few days' time.  She came in and was treated with an Radio broadcast assistant.  It does not appear to be infected, but does seem to be filling back up again. No change in medications or social history.  On exam, she is alert, oriented, cooperative, not in any acute distress. She is pleasant. Pupils are equal. Extraocular muscles are intact.  No cervical lymphadenopathy.  Breathing is unlabored.  Heart is regular. Abdomen is soft.  Lower extremity edema present bilaterally, but no increase from last visit.  The blister was debrided from the superficial layer and we will place Silvadene on it with a wrap and see her back in 1 week.     Wayland Denis, DO     CS/MEDQ  D:  12/31/2011  T:  12/31/2011  Job:  [id number]

## 2012-01-01 LAB — PULMONARY FUNCTION TEST

## 2012-01-07 NOTE — Progress Notes (Signed)
[facility]  NAME:  [name], [name]                      ACCOUNT NO.:  MEDICAL RECORD NO.:  [id number]      DATE OF BIRTH:  01-02-1934  PHYSICIAN:  Wayland Denis, DO       VISIT DATE:  01/07/2012                                  OFFICE VISIT   The patient is a 78 year old female who is here for followup on her right lower extremity ulcer.  The original ulcer has healed, but she developed a blister on the back of her leg underneath her wrap from previously.  The last week, we used Silvadene and Profore Lite.  She had quite a bit of drainage in the end of the week, but overall has decreased swelling and the area does appear to be superficial and looks very similar to a superficial burn.  No change in medications or social history.  On exam, she is alert, oriented, cooperative, not in any acute distress. She is pleasant.  Pupils are equal.  Abdomen is soft.  Pulses are regular and strong.  Leg has decreased swelling from last week.  It does not appear to have any infection.  We will continue with the Silvadene and Profore Lite. We will also have it changed at home once during the week and see her back in 1 week.     Wayland Denis, DO     CS/MEDQ  D:  01/07/2012  T:  01/07/2012  Job:  [id number]

## 2012-01-14 ENCOUNTER — Encounter (HOSPITAL_BASED_OUTPATIENT_CLINIC_OR_DEPARTMENT_OTHER): Payer: Medicare Other | Attending: Plastic Surgery

## 2012-01-14 DIAGNOSIS — I872 Venous insufficiency (chronic) (peripheral): Secondary | ICD-10-CM | POA: Insufficient documentation

## 2012-01-14 DIAGNOSIS — L97809 Non-pressure chronic ulcer of other part of unspecified lower leg with unspecified severity: Secondary | ICD-10-CM | POA: Insufficient documentation

## 2012-01-15 NOTE — Progress Notes (Signed)
[facility]  NAME:  JAYMARIE, YEAKEL                 ACCOUNT NO.:  [id number]  MEDICAL RECORD NO.:  [id number]      DATE OF BIRTH:  Jul 08, 1934  PHYSICIAN:  Wayland Denis, DO       VISIT DATE:  01/14/2012                                  OFFICE VISIT   The patient is a 78 year old female who is here for followup on her right lower extremity ulcer.  Her original ulcer has actually completely healed.  The blistered area, she has been using Silvadene with a Profore Lite with quite a bit of improvement.  It still looks to be superficial and not needed grafting.  She seems the pain has improved as well. There has been no change in her medications or social history.  On exam, she is alert, oriented, and cooperative, not in any acute distress.  She is pleasant.  Pupils are equal.  Extraocular muscles are intact.  No cervical lymphadenopathy.  Her breathing is unlabored.  Her heart is regular.  We will continue with Silvadene and a Profore Lite and see if we can get a triple-layer Oasis to put on that area, which would help for both the healing and pain management.  Thank you very much.     Wayland Denis, DO     CS/MEDQ  D:  01/14/2012  T:  01/15/2012  Job:  [id number]

## 2012-01-22 NOTE — Progress Notes (Signed)
[facility]  NAME:  [name], [name]                      ACCOUNT NO.:  MEDICAL RECORD NO.:  [id number]      DATE OF BIRTH:  08-Apr-1934  PHYSICIAN:  Wayland Denis, DO       VISIT DATE:  01/21/2012                                  OFFICE VISIT   The patient is a 78 year old female who is here for followup on her right lower extremity ulcer.  The first ulcer is completely healed.  The posterior blister ulcer is looking much better.  She is actually epithelializing of good portion of it.  She had a thick amount of exudate over the area that was debrided and those notes are in the chart.  A time-out was called and all information was confirmed to be correct when that was done.  Her social history and medications are unchanged.  On exam, she is alert, oriented, cooperative, not in any acute distress. She is pleasant.  Her pupils are equal.  Extraocular muscles are intact. No cervical lymphadenopathy.  Her breathing is unlabored.  Her heart is regular.  The wound is described above.  We will continue with dressing changes, and we will use collagen instead of the Silvadene and Profore Lite and will have her follow up in 1 week.  In the meantime, she is encouraged to continue with elevation and increasing her protein.     Wayland Denis, DO     CS/MEDQ  D:  01/21/2012  T:  01/21/2012  Job:  [id number]

## 2012-01-29 NOTE — Progress Notes (Signed)
[facility]  NAME:  [name], [name]                 ACCOUNT NO.:  [id number]  MEDICAL RECORD NO.:  [id number]      DATE OF BIRTH:  1934-03-20  PHYSICIAN:  Wayland Denis, DO       VISIT DATE:  01/28/2012                                  OFFICE VISIT   The patient is a 78 year old female who is here for followup on her right lower extremity chronic venous insufficiency ulcer.  She had Silvadene and Profore Lite last week and has done well.  She is healing. There is not any sign of infection.  No change in her medications or social history.  PHYSICAL EXAMINATION:  GENERAL:  She is alert, oriented, cooperative, not in any acute distress.  She is pleasant. EYES:  Pupils equal.  Extraocular muscles are intact. LUNGS:  Her breathing is unlabored.  The wound was debrided with a curette and prepared for the placement of Oasis, 2 sheets were use as triple layer and dressing applied.  The Oasis was applied sterilely and time-out was called.  All information was confirmed to be correct.  Another Profore Lite will be placed and we will see her back in 1 week.     Alan Ripper Sanger, DO     CS/MEDQ  D:  01/28/2012  T:  01/29/2012  Job:  [id number]

## 2012-02-04 NOTE — Progress Notes (Signed)
[facility]  NAME:  DARCY, BARBARA                 ACCOUNT NO.:  [id number]  MEDICAL RECORD NO.:  [id number]      DATE OF BIRTH:  1933/09/14  PHYSICIAN:  Wayland Denis, DO       VISIT DATE:  02/04/2012                                  OFFICE VISIT   The patient is a 78 year old female who is here for followup on her right lower extremity chronic venous insufficiency ulcer.  OASIS was placed last week and has mostly incorporated.  There were a few areas where it had not.  There is no change in her medications or social history.  PHYSICAL EXAMINATION:  GENERAL:  She is alert, oriented, cooperative, not in any acute distress.  She is pleasant. HEENT:  Pupils are equal. LUNGS:  Her breathing is unlabored.  OASIS was prepared according to the manufacture guidelines and placed on the wound.  A 5 x 7 piece was utilized.  Silvercel and wrap was placed with the Profore Lite.  All of the then OASIS was utilized and it was activated with normal saline.  The patient tolerated it well, and we will see her back in 1 week.     Wayland Denis, DO     CS/MEDQ  D:  02/04/2012  T:  02/04/2012  Job:  [id number]

## 2012-02-12 NOTE — Progress Notes (Signed)
[facility]  NAME:  [name], [name]                      ACCOUNT NO.:  MEDICAL RECORD NO.:  [id number]      DATE OF BIRTH:  03/30/1934  PHYSICIAN:  Wayland Denis, DO       VISIT DATE:  02/11/2012                                  OFFICE VISIT   The patient is a 78 year old female who is here for followup on her right lower extremity ulcer.  She had Oasis placed last week and the wound is actually doing much better and epithelializing.  It is smaller, improving.  No sign of infection, however her leg is extremely swollen. The foot is about like it was last week, but the proximal leg is more swollen.  There has been no change in her medications or social history.  PHYSICAL EXAMINATION:  GENERAL:  She is alert, oriented, cooperative, not in any acute distress.  She is pleasant. HEENT:  Pupils equal.  Extraocular muscles intact. NECK:  No cervical lymphadenopathy. LUNGS:  Breathing is unlabored. HEART:  Regular. ABDOMEN:  Soft. LOWER EXTREMITY:  Pulses very difficult to palpate due to the swelling.  We are going to continue with local care.  We will use Aquacel Ag wrap and send her for vascular studies to further investigate the swelling.     Wayland Denis, DO     CS/MEDQ  D:  02/11/2012  T:  02/12/2012  Job:  [id number]

## 2012-02-18 ENCOUNTER — Encounter (HOSPITAL_BASED_OUTPATIENT_CLINIC_OR_DEPARTMENT_OTHER): Payer: Medicare Other | Attending: Plastic Surgery

## 2012-02-18 DIAGNOSIS — L97809 Non-pressure chronic ulcer of other part of unspecified lower leg with unspecified severity: Secondary | ICD-10-CM | POA: Insufficient documentation

## 2012-02-18 DIAGNOSIS — I872 Venous insufficiency (chronic) (peripheral): Secondary | ICD-10-CM | POA: Insufficient documentation

## 2012-02-19 NOTE — Progress Notes (Signed)
[facility]  NAME:  ARALY, KAAS                 ACCOUNT NO.:  [id number]  MEDICAL RECORD NO.:  [id number]      DATE OF BIRTH:  [birth year]  PHYSICIAN:  Wayland Denis, DO       VISIT DATE:  02/18/2012                                  OFFICE VISIT   The patient is a 78 year old female who is here for follow up on her right lower extremity ulcer.  She has had Oasis placed several times and is doing extremely well, healing up, and epithelializing.  There is no sign of infection.  No change in her medical history or medications.  On exam, she is alert, oriented, cooperative, not in any acute distress. She is showing great signs of improvement.  Her breathing is unlabored. Her heart is regular.  We will continue with Silvercel for now with zinc.  I will have her follow up in 1 week.     Wayland Denis, DO     CS/MEDQ  D:  02/18/2012  T:  02/19/2012  Job:  [id number]

## 2012-02-26 NOTE — Progress Notes (Signed)
[facility]  NAME:  [name], [name]                 ACCOUNT NO.:  [id number]  MEDICAL RECORD NO.:  [id number]      DATE OF BIRTH:  [birth year]  PHYSICIAN:  Wayland Denis, DO       VISIT DATE:  02/25/2012                                  OFFICE VISIT   The patient is a 78 year old female who is here for followup on her right lower extremity chronic venous insufficiency ulcers.  She had a Oasis placed several times and then last week with Silvercel.  She has done extremely well and is completely healed at this point.  She still has quite a bit of swelling, and she has an appointment for [place] for evaluation in the next few weeks.  There has been no change in her medications or social history.  On exam, she is alert, oriented, cooperative, not in any acute distress. She is very pleasant.  Her pupils are equal.  Extraocular muscles intact.  No cervical lymphadenopathy.  Breathing is unlabored.  Heart is regular.  Abdomen is soft.  We will continue with lotion and stocking and have her follow up as needed.     Wayland Denis, DO     CS/MEDQ  D:  02/25/2012  T:  02/26/2012  Job:  [id number]

## 2012-04-02 ENCOUNTER — Encounter (HOSPITAL_COMMUNITY): Payer: Self-pay | Admitting: General Practice

## 2012-04-02 ENCOUNTER — Encounter (HOSPITAL_COMMUNITY): Payer: Self-pay | Admitting: Family Medicine

## 2012-04-02 ENCOUNTER — Inpatient Hospital Stay (HOSPITAL_COMMUNITY): Payer: Medicare Other

## 2012-04-02 ENCOUNTER — Inpatient Hospital Stay (HOSPITAL_COMMUNITY)
Admission: AD | Admit: 2012-04-02 | Discharge: 2012-04-09 | DRG: 292 | Disposition: A | Discharge status: Home / Self Care | Payer: Medicare Other | Source: Ambulatory Visit | Attending: Internal Medicine | Admitting: Internal Medicine

## 2012-04-02 ENCOUNTER — Emergency Department (HOSPITAL_COMMUNITY)
Admission: EM | Admit: 2012-04-02 | Discharge: 2012-04-02 | Disposition: A | Discharge status: Other Acute Inpatient Hospital | Payer: Medicare Other | Attending: Emergency Medicine | Admitting: Emergency Medicine

## 2012-04-02 DIAGNOSIS — G4730 Sleep apnea, unspecified: Secondary | ICD-10-CM | POA: Diagnosis present

## 2012-04-02 DIAGNOSIS — Z96659 Presence of unspecified artificial knee joint: Secondary | ICD-10-CM

## 2012-04-02 DIAGNOSIS — E7800 Pure hypercholesterolemia, unspecified: Secondary | ICD-10-CM | POA: Diagnosis present

## 2012-04-02 DIAGNOSIS — I351 Nonrheumatic aortic (valve) insufficiency: Secondary | ICD-10-CM | POA: Diagnosis present

## 2012-04-02 DIAGNOSIS — Z7982 Long term (current) use of aspirin: Secondary | ICD-10-CM

## 2012-04-02 DIAGNOSIS — Z86718 Personal history of other venous thrombosis and embolism: Secondary | ICD-10-CM

## 2012-04-02 DIAGNOSIS — I872 Venous insufficiency (chronic) (peripheral): Secondary | ICD-10-CM | POA: Diagnosis present

## 2012-04-02 DIAGNOSIS — Z6841 Body Mass Index (BMI) 40.0 and over, adult: Secondary | ICD-10-CM

## 2012-04-02 DIAGNOSIS — Z79899 Other long term (current) drug therapy: Secondary | ICD-10-CM

## 2012-04-02 DIAGNOSIS — L97809 Non-pressure chronic ulcer of other part of unspecified lower leg with unspecified severity: Secondary | ICD-10-CM | POA: Diagnosis present

## 2012-04-02 DIAGNOSIS — N183 Chronic kidney disease, stage 3 unspecified: Secondary | ICD-10-CM | POA: Diagnosis present

## 2012-04-02 DIAGNOSIS — Z23 Encounter for immunization: Secondary | ICD-10-CM

## 2012-04-02 DIAGNOSIS — M109 Gout, unspecified: Secondary | ICD-10-CM | POA: Diagnosis present

## 2012-04-02 DIAGNOSIS — E119 Type 2 diabetes mellitus without complications: Secondary | ICD-10-CM | POA: Diagnosis present

## 2012-04-02 DIAGNOSIS — I10 Essential (primary) hypertension: Secondary | ICD-10-CM | POA: Diagnosis present

## 2012-04-02 DIAGNOSIS — E1169 Type 2 diabetes mellitus with other specified complication: Secondary | ICD-10-CM | POA: Diagnosis present

## 2012-04-02 DIAGNOSIS — Z794 Long term (current) use of insulin: Secondary | ICD-10-CM

## 2012-04-02 DIAGNOSIS — I272 Pulmonary hypertension, unspecified: Secondary | ICD-10-CM | POA: Diagnosis present

## 2012-04-02 DIAGNOSIS — G4733 Obstructive sleep apnea (adult) (pediatric): Secondary | ICD-10-CM | POA: Diagnosis present

## 2012-04-02 DIAGNOSIS — Z0389 Encounter for observation for other suspected diseases and conditions ruled out: Secondary | ICD-10-CM | POA: Insufficient documentation

## 2012-04-02 DIAGNOSIS — I509 Heart failure, unspecified: Secondary | ICD-10-CM | POA: Diagnosis present

## 2012-04-02 DIAGNOSIS — I5189 Other ill-defined heart diseases: Secondary | ICD-10-CM | POA: Diagnosis present

## 2012-04-02 DIAGNOSIS — I5033 Acute on chronic diastolic (congestive) heart failure: Secondary | ICD-10-CM | POA: Diagnosis present

## 2012-04-02 DIAGNOSIS — I421 Obstructive hypertrophic cardiomyopathy: Secondary | ICD-10-CM | POA: Diagnosis present

## 2012-04-02 DIAGNOSIS — I2789 Other specified pulmonary heart diseases: Secondary | ICD-10-CM | POA: Diagnosis present

## 2012-04-02 HISTORY — DX: Chronic kidney disease, stage 3 (moderate): N18.3

## 2012-04-02 HISTORY — DX: Cardiac murmur, unspecified: R01.1

## 2012-04-02 HISTORY — DX: Other specified disorders of veins: I87.8

## 2012-04-02 HISTORY — DX: Obstructive sleep apnea (adult) (pediatric): G47.33

## 2012-04-02 HISTORY — DX: Pulmonary hypertension, unspecified: I27.20

## 2012-04-02 HISTORY — DX: Chronic kidney disease, stage 3 unspecified: N18.30

## 2012-04-02 HISTORY — DX: Unspecified osteoarthritis, unspecified site: M19.90

## 2012-04-02 HISTORY — DX: Type 2 diabetes mellitus without complications: E11.9

## 2012-04-02 HISTORY — DX: Dependence on other enabling machines and devices: Z99.89

## 2012-04-02 HISTORY — DX: Obstructive hypertrophic cardiomyopathy: I42.1

## 2012-04-02 HISTORY — DX: Nonrheumatic aortic (valve) insufficiency: I35.1

## 2012-04-02 LAB — BASIC METABOLIC PANEL
CO2: 32 mEq/L (ref 19–32)
Calcium: 9.5 mg/dL (ref 8.4–10.5)
Chloride: 105 mEq/L (ref 96–112)
Creatinine, Ser: 1.15 mg/dL — ABNORMAL HIGH (ref 0.50–1.10)
Glucose, Bld: 88 mg/dL (ref 70–99)
Sodium: 145 mEq/L (ref 135–145)

## 2012-04-02 LAB — CBC
HCT: 41.3 % (ref 36.0–46.0)
Hemoglobin: 12.9 g/dL (ref 12.0–15.0)
MCV: 87.7 fL (ref 78.0–100.0)
RDW: 16.9 % — ABNORMAL HIGH (ref 11.5–15.5)
WBC: 8.3 10*3/uL (ref 4.0–10.5)

## 2012-04-02 LAB — PRO B NATRIURETIC PEPTIDE: Pro B Natriuretic peptide (BNP): 3607 pg/mL — ABNORMAL HIGH (ref 0–450)

## 2012-04-02 MED ORDER — FUROSEMIDE 10 MG/ML IJ SOLN
80.0000 mg | Freq: Two times a day (BID) | INTRAMUSCULAR | Status: DC
  Administered 2012-04-02 – 2012-04-06 (×8): 80 mg via INTRAVENOUS
  Filled 2012-04-02 (×9): qty 8

## 2012-04-02 MED ORDER — HYDRALAZINE HCL 50 MG PO TABS
50.0000 mg | ORAL_TABLET | Freq: Two times a day (BID) | ORAL | Status: DC
  Administered 2012-04-02 – 2012-04-04 (×4): 50 mg via ORAL
  Filled 2012-04-02 (×5): qty 1

## 2012-04-02 MED ORDER — SODIUM CHLORIDE 0.9 % IJ SOLN
3.0000 mL | Freq: Two times a day (BID) | INTRAMUSCULAR | Status: DC
  Administered 2012-04-02 – 2012-04-09 (×14): 3 mL via INTRAVENOUS

## 2012-04-02 MED ORDER — ONDANSETRON HCL 4 MG/2ML IJ SOLN: 4.0000 mg | Freq: Four times a day (QID) | INTRAMUSCULAR | Status: DC | PRN

## 2012-04-02 MED ORDER — HYDRALAZINE HCL 25 MG PO TABS
25.0000 mg | ORAL_TABLET | Freq: Two times a day (BID) | ORAL | Status: DC
  Filled 2012-04-02: qty 1

## 2012-04-02 MED ORDER — CLONIDINE HCL 0.2 MG PO TABS
0.2000 mg | ORAL_TABLET | Freq: Two times a day (BID) | ORAL | Status: DC
  Administered 2012-04-02 – 2012-04-09 (×14): 0.2 mg via ORAL
  Filled 2012-04-02 (×15): qty 1

## 2012-04-02 MED ORDER — ACETAMINOPHEN 325 MG PO TABS
650.0000 mg | ORAL_TABLET | ORAL | Status: DC | PRN
  Administered 2012-04-04: 650 mg via ORAL
  Filled 2012-04-02 (×2): qty 2

## 2012-04-02 MED ORDER — ISOSORBIDE MONONITRATE ER 30 MG PO TB24
30.0000 mg | ORAL_TABLET | Freq: Every day | ORAL | Status: DC
  Administered 2012-04-02 – 2012-04-04 (×3): 30 mg via ORAL
  Filled 2012-04-02 (×3): qty 1

## 2012-04-02 MED ORDER — POTASSIUM CHLORIDE CRYS ER 20 MEQ PO TBCR
20.0000 meq | EXTENDED_RELEASE_TABLET | Freq: Two times a day (BID) | ORAL | Status: DC
  Administered 2012-04-02 – 2012-04-09 (×14): 20 meq via ORAL
  Filled 2012-04-02 (×15): qty 1

## 2012-04-02 MED ORDER — SODIUM CHLORIDE 0.9 % IJ SOLN: 3.0000 mL | INTRAMUSCULAR | Status: DC | PRN

## 2012-04-02 MED ORDER — INFLUENZA VIRUS VACC SPLIT PF IM SUSP
0.5000 mL | INTRAMUSCULAR | Status: AC
  Administered 2012-04-03: 0.5 mL via INTRAMUSCULAR
  Filled 2012-04-02: qty 0.5

## 2012-04-02 MED ORDER — PANTOPRAZOLE SODIUM 40 MG PO TBEC
40.0000 mg | DELAYED_RELEASE_TABLET | Freq: Every day | ORAL | Status: DC
  Administered 2012-04-03 – 2012-04-09 (×7): 40 mg via ORAL
  Filled 2012-04-02 (×7): qty 1

## 2012-04-02 MED ORDER — PNEUMOCOCCAL VAC POLYVALENT 25 MCG/0.5ML IJ INJ
0.5000 mL | INJECTION | INTRAMUSCULAR | Status: AC
  Administered 2012-04-03: 0.5 mL via INTRAMUSCULAR
  Filled 2012-04-02: qty 0.5

## 2012-04-02 MED ORDER — SODIUM CHLORIDE 0.9 % IV SOLN: 250.0000 mL | INTRAVENOUS | Status: DC | PRN

## 2012-04-02 MED ORDER — INSULIN ASPART 100 UNIT/ML ~~LOC~~ SOLN
0.0000 [IU] | Freq: Three times a day (TID) | SUBCUTANEOUS | Status: DC
  Administered 2012-04-03 – 2012-04-04 (×4): 3 [IU] via SUBCUTANEOUS
  Administered 2012-04-04: 4 [IU] via SUBCUTANEOUS
  Administered 2012-04-05: 3 [IU] via SUBCUTANEOUS
  Administered 2012-04-05 – 2012-04-06 (×4): 4 [IU] via SUBCUTANEOUS
  Administered 2012-04-06 – 2012-04-08 (×5): 3 [IU] via SUBCUTANEOUS

## 2012-04-02 MED ORDER — METOPROLOL SUCCINATE 12.5 MG HALF TABLET
12.5000 mg | ORAL_TABLET | Freq: Every day | ORAL | Status: DC
  Administered 2012-04-02 – 2012-04-04 (×3): 12.5 mg via ORAL
  Filled 2012-04-02 (×3): qty 1

## 2012-04-02 MED ORDER — ASPIRIN EC 81 MG PO TBEC
81.0000 mg | DELAYED_RELEASE_TABLET | Freq: Every day | ORAL | Status: DC
  Administered 2012-04-02 – 2012-04-09 (×8): 81 mg via ORAL
  Filled 2012-04-02 (×8): qty 1

## 2012-04-02 NOTE — H&P (Signed)
[name] is an 78 y.o. female.   Chief Complaint:   SOB/Orthopnea  HPI:   The patient is a 78 yo morbidly obese female with a history of DM, HTN, Chronic LEE, pulmonary HTN, OSA-on CPAP, DM and mild aortic insufficiency.  Her last echo was in 2012 and revealed an EF > 55%, intracavitary gradient with stage two diastolic dysfunction and elevated left atrial pressure.   RVSP of .  She had a Low Risk, non-ischemic Myoview ST in 11/2010.  The patient presented to the office with SOB, four pillow orthopnea, PND, severe LEE.  She has been SOB for two months and progressively getting worse.  She denies N, V, fever, palpitations, CP, dizziness, Abd pain, dysuria, hematuria, hematochezia.   Apparently over the last couple months she has increased her Lasix has not had a significant response.  Past Medical History  Diagnosis Date  . Diabetes mellitus   . Hypertension   . Gout   . High cholesterol   . DVT (deep venous thrombosis)     Past Surgical History  Procedure Date  . Total knee arthroplasty    No family history on file. Social History:  reports that she has never smoked. She has never used smokeless tobacco. She reports that she does not drink alcohol or use illicit drugs.  Allergies: No Known Allergies  Review of Systems  Constitutional: Negative for fever and diaphoresis.  HENT: Negative for congestion and sore throat.   Eyes: Negative for double vision.  Respiratory: Positive for shortness of breath. Negative for cough and sputum production.   Cardiovascular: Positive for orthopnea, leg swelling and PND. Negative for chest pain and palpitations.  Gastrointestinal: Negative for nausea, vomiting, abdominal pain, diarrhea, constipation and blood in stool.  Genitourinary: Negative for dysuria.  Neurological: Negative for dizziness.    Blood pressure 152/80, pulse 80, temperature 97.7 F (36.5 C), temperature source Oral, resp. rate 18, height 5\' 6"  (1.676 m), weight 124.558  kg (274 lb 9.6 oz), SpO2 96.00%. Physical Exam  Constitutional: She is oriented to person, place, and time. No distress.       Morbidly obese, deconditioned female  HENT:  Head: Normocephalic and atraumatic.  Eyes: EOM are normal. Pupils are equal, round, and reactive to light. No scleral icterus.  Neck: Normal range of motion. Neck supple. JVD present.  Cardiovascular: Normal rate, regular rhythm, S1 normal and S2 normal.   Murmur heard.  Systolic murmur is present with a grade of 2/6  Pulses:      Radial pulses are 2+ on the right side, and 2+ on the left side.       Dorsalis pedis pulses are 2+ on the right side, and 2+ on the left side.       No Carotid Bruit MM at LSB but does have a 2/6 high-pitched MM in the axillae.  Respiratory: Effort normal and breath sounds normal. She has no wheezes. She has no rales.  GI: Soft. Bowel sounds are normal. There is no tenderness.  Musculoskeletal: She exhibits edema (3+ tense LEE above the knees ).  Lymphadenopathy:    She has no cervical adenopathy.  Neurological: She is alert and oriented to person, place, and time. She exhibits normal muscle tone.  Skin: Skin is warm and dry.  Psychiatric: She has a normal mood and affect.     Assessment/Plan Patient Active Hospital Problem List: Acute on chronic diastolic CHF (congestive heart failure), NYHA class 3 (04/02/2012) Pulmonary hypertension (10/15/2011) Sleep apnea (  10/17/2011) Morbid obesity (04/02/2012) Aortic insufficiency: mild (04/02/2012)  Plan:  She will be admitted to telemetry for IV diuretic therapy and 2 D Echo as well as an ischemia evaluation.  She has a 2/6 sys MM rather highpitched and radiating to the axillae which may be new/worse.    HAGER, BRYAN 04/02/2012, 4:24 PM  ATTENDING ATTESTATION:  I have seen and examined the patient, and have reviewed the chart, notes and new data.  I agree with Judie Grieve Hager's findings, examination & recommendations as noted above.  Brief  Description: 78 y/o morbidly obese woman with OSA & Pulmonary HTN (? Component of obesity hypoventilation syndrome) along with chronic diastolic dysfunction (with preserved EF) who was seen today in [facility] clinic by Mr. Leron Croak with progressively worsening edema, PND & orthopnea along with DOE over the past 2 months.  ROS did not note any anginal symptoms or palpitations to suggest ischemic or arrhythmic etiology.  After a full evaluation, Mr. Leron Croak felt that she would be best served with in patient admission for Acute on Chronic Diastolic HF.  Key examination changes: I agree with Mr. Hager's exam.  Gross pitting edema up to the mid thigh chronic venous stasis changes of the right lower extremity, with oozing tissue.  Key new findings / data: labs are pending upon my initial evaluation.  PLAN:  Admit for IV diuresis & afterload reduction - with increase hydralazine and add nitrates, echo be converted to ACE inhibitor/ARB depending on her status as she is a diabetic.  Will get baseline ProBNP level for trend evaluation along with daily weights.  Agree with Echo to ensure EF remains normal & with what sounds like MR (vs. TR) on exam -- is not, would consider ischemia evaluation given her notable Cardiac RFs of DM, HTN & HLD.  Will also provide data on LV filling pressures & RV volume status.    Will need to check on +/- CPAP. May need reevaluation as an outpatient as she's describes the mask is feeling of smothering sensation and not working properly.  Home DM regimen with SSI.  Would preferentially use SCDs for DVT prophylaxis.  Will obtain wound care consultation for dressing assistance on her right leg venous stasis changes.  Marykay Lex, M.D., M.S. [facility] [address] [place], [place]  [address]  [phone]  04/02/2012 5:53 PM

## 2012-04-02 NOTE — ED Notes (Signed)
Per pt sts SOB x a few weeks. sts sent here by her doctor because she has fluid

## 2012-04-03 ENCOUNTER — Inpatient Hospital Stay (HOSPITAL_COMMUNITY): Payer: Medicare Other

## 2012-04-03 LAB — GLUCOSE, CAPILLARY: Glucose-Capillary: 137 mg/dL — ABNORMAL HIGH (ref 70–99)

## 2012-04-03 LAB — BASIC METABOLIC PANEL
BUN: 19 mg/dL (ref 6–23)
CO2: 33 mEq/L — ABNORMAL HIGH (ref 19–32)
Chloride: 104 mEq/L (ref 96–112)
Creatinine, Ser: 1.27 mg/dL — ABNORMAL HIGH (ref 0.50–1.10)
GFR calc Af Amer: 46 mL/min — ABNORMAL LOW (ref >90)
Potassium: 3.8 mEq/L (ref 3.5–5.1)

## 2012-04-03 NOTE — Progress Notes (Signed)
[facility]  DAILY PROGRESS NOTE   Subjective:  A little better. Excellent diuresis: negative 2.5 L just overnight. Still with massive lower extremity edema. Echo not yet done. CXR confirms cardiomegaly and vascular congestion.  Objective:  Temp:  [97 F (36.1 C)-98.3 F (36.8 C)] 98.3 F (36.8 C) (09/19 0535) Pulse Rate:  [73-90] 73  (09/19 0535) Resp:  [18-20] 18  (09/19 0535) BP: (121-153)/(51-86) 153/72 mmHg (09/19 0535) SpO2:  [94 %-100 %] 100 % (09/19 0535) Weight:  [120.067 kg (264 lb 11.2 oz)-124.558 kg (274 lb 9.6 oz)] 120.067 kg (264 lb 11.2 oz) (09/19 0535) Weight change:   Intake/Output from previous day: 09/18 0701 - 09/19 0700 In: 348 [P.O.:340; IV Piggyback:8] Out: 2850 [Urine:2850]  Intake/Output from this shift: Total I/O In: 240 [P.O.:240] Out: -   Medications: Current Facility-Administered Medications  Medication Dose Route Frequency Provider Last Rate Last Dose  . 0.9 %  sodium chloride infusion  250 mL Intravenous PRN Wilburt Finlay, PA      . acetaminophen (TYLENOL) tablet 650 mg  650 mg Oral Q4H PRN Wilburt Finlay, PA      . aspirin EC tablet 81 mg  81 mg Oral Daily Wilburt Finlay, PA   81 mg at 04/02/12 1858  . cloNIDine (CATAPRES) tablet 0.2 mg  0.2 mg Oral BID Wilburt Finlay, PA   0.2 mg at 04/02/12 2141  . furosemide (LASIX) injection 80 mg  80 mg Intravenous BID Wilburt Finlay, PA   80 mg at 04/02/12 1854  . hydrALAZINE (APRESOLINE) tablet 50 mg  50 mg Oral BID Marykay Lex, MD   50 mg at 04/02/12 2141  . influenza  inactive virus vaccine (FLUZONE/FLUARIX) injection 0.5 mL  0.5 mL Intramuscular Tomorrow-1000 Chrystie Nose, MD      . insulin aspart (novoLOG) injection 0-20 Units  0-20 Units Subcutaneous TID WC Wilburt Finlay, PA   3 Units at 04/03/12 0641  . isosorbide mononitrate (IMDUR) 24 hr tablet 30 mg  30 mg Oral Daily Marykay Lex, MD   30 mg at 04/02/12 2141  . metoprolol succinate (TOPROL-XL) 24 hr tablet 12.5 mg  12.5  mg Oral Daily Wilburt Finlay, PA   12.5 mg at 04/02/12 1859  . ondansetron (ZOFRAN) injection 4 mg  4 mg Intravenous Q6H PRN Wilburt Finlay, PA      . pantoprazole (PROTONIX) EC tablet 40 mg  40 mg Oral Q1200 Wilburt Finlay, PA      . pneumococcal 23 valent vaccine (PNU-IMMUNE) injection 0.5 mL  0.5 mL Intramuscular Tomorrow-1000 Chrystie Nose, MD      . potassium chloride SA (K-DUR,KLOR-CON) CR tablet 20 mEq  20 mEq Oral BID Wilburt Finlay, PA   20 mEq at 04/02/12 2141  . sodium chloride 0.9 % injection 3 mL  3 mL Intravenous Q12H Wilburt Finlay, PA   3 mL at 04/02/12 2144  . sodium chloride 0.9 % injection 3 mL  3 mL Intravenous PRN Wilburt Finlay, PA      . DISCONTD: hydrALAZINE (APRESOLINE) tablet 25 mg  25 mg Oral BID Wilburt Finlay, PA        Physical Exam: General appearance: alert, cooperative, no distress and moderately obese Neck: JVD - 10 cm above sternal notch, no adenopathy, no carotid bruit, supple, symmetrical, trachea midline and thyroid not enlarged, symmetric, no tenderness/mass/nodules Lungs: clear to auscultation bilaterally Heart: regular rate and rhythm, S1, S2 normal and systolic murmur: holosystolic 2/6, blowing at lower left  sternal border Abdomen: soft, non-tender; bowel sounds normal; no masses,  no organomegaly Extremities: edema 4+ above knees, R>L, healing stasis ulcer R leg Pulses: 2+ and symmetric Skin: 10 cmstasis ulcer on posteromedial R calf Neurologic: Alert and oriented X 3, normal strength and tone. Normal symmetric reflexes. Normal coordination and gait  Lab Results: Results for orders placed during the hospital encounter of 04/02/12 (from the past 48 hour(s))  BASIC METABOLIC PANEL     Status: Abnormal   Collection Time   04/02/12  6:18 PM      Component Value Range Comment   Sodium 145  135 - 145 mEq/L    Potassium 3.6  3.5 - 5.1 mEq/L    Chloride 105  96 - 112 mEq/L    CO2 32  19 - 32 mEq/L    Glucose, Bld 88  70 - 99 mg/dL    BUN 19  6 - 23 mg/dL     Creatinine, Ser 9.60 (*) 0.50 - 1.10 mg/dL    Calcium 9.5  8.4 - 45.4 mg/dL    GFR calc non Af Amer 45 (*) >90 mL/min    GFR calc Af Amer 52 (*) >90 mL/min   PRO B NATRIURETIC PEPTIDE     Status: Abnormal   Collection Time   04/02/12  6:18 PM      Component Value Range Comment   Pro B Natriuretic peptide (BNP) 3607.0 (*) 0 - 450 pg/mL   CBC     Status: Abnormal   Collection Time   04/02/12  6:18 PM      Component Value Range Comment   WBC 8.3  4.0 - 10.5 K/uL    RBC 4.71  3.87 - 5.11 MIL/uL    Hemoglobin 12.9  12.0 - 15.0 g/dL    HCT 09.8  11.9 - 14.7 %    MCV 87.7  78.0 - 100.0 fL    MCH 27.4  26.0 - 34.0 pg    MCHC 31.2  30.0 - 36.0 g/dL    RDW 82.9 (*) 56.2 - 15.5 %    Platelets 222  150 - 400 K/uL   GLUCOSE, CAPILLARY     Status: Abnormal   Collection Time   04/02/12  8:55 PM      Component Value Range Comment   Glucose-Capillary 131 (*) 70 - 99 mg/dL   GLUCOSE, CAPILLARY     Status: Abnormal   Collection Time   04/03/12  6:00 AM      Component Value Range Comment   Glucose-Capillary 134 (*) 70 - 99 mg/dL   BASIC METABOLIC PANEL     Status: Abnormal   Collection Time   04/03/12  6:02 AM      Component Value Range Comment   Sodium 144  135 - 145 mEq/L    Potassium 3.8  3.5 - 5.1 mEq/L    Chloride 104  96 - 112 mEq/L    CO2 33 (*) 19 - 32 mEq/L    Glucose, Bld 130 (*) 70 - 99 mg/dL    BUN 19  6 - 23 mg/dL    Creatinine, Ser 1.30 (*) 0.50 - 1.10 mg/dL    Calcium 9.5  8.4 - 86.5 mg/dL    GFR calc non Af Amer 40 (*) >90 mL/min    GFR calc Af Amer 46 (*) >90 mL/min     Imaging: Dg Chest 2 View  04/03/2012  *RADIOLOGY REPORT*  Clinical Data: Shortness of breath.  Lower extremity swelling.  CHEST - 2 VIEW  Comparison: PA and lateral chest 10/18/2011.  Findings: Cardiomegaly and vascular congestion appear unchanged. There is no frank pulmonary edema.  No focal airspace disease or effusion.  No pneumothorax.  IMPRESSION: Cardiomegaly and pulmonary vascular congestion.  No acute  finding.   Original Report Authenticated By: Bernadene Bell. Maricela Curet, M.D.     Assessment:  1. Principal Problem: 2.  *Acute on chronic diastolic CHF (congestive heart failure), NYHA class 3 3. Active Problems: 4.  Pulmonary hypertension 5.  Sleep apnea 6.  Morbid obesity 7.  Aortic insufficiency: mild 8.   Plan:  1. Continue diuretics. Expect she needs to lose 9-10 lb of fluid. Echo pending. Clinically she has predominantly RHF. BNP is markedly higher than 2011 (but we are comparing BNP to pro BNP).  Time Spent Directly with Patient:  30 minutes  Length of Stay:  LOS: 1 day    Karron Goens 04/03/2012, 8:27 AM

## 2012-04-03 NOTE — Progress Notes (Signed)
  Echocardiogram 2D Echocardiogram has been performed.  [name] 04/03/2012, 11:23 AM

## 2012-04-03 NOTE — Progress Notes (Signed)
UR done. 

## 2012-04-03 NOTE — Progress Notes (Signed)
Pt placed on CPAP on auto setting on room air.

## 2012-04-03 NOTE — Consult Note (Signed)
WOC consult Note Reason for Consult: Consult requested for stasis ulcer areas. Pt has history of significant stasis ulcers which have healed.  She began having swelling to BLE this week which has now evolved to a few patchy areas of blisters and some have ruptured and evolved into partial thickness wounds on right leg.  Mod yellow drainage, no odor. Left leg without wounds or drainage. Largest area of wound is .1X.1X.1cm.  Remains with generalized edema to BLE which she said is improving.  She has compression stockings at home which she can resume using after discharge.  Dressing procedure/placement/frequency: Foam dressing to protect and promote healing. Elevate legs as much as possible. Will not plan to follow further unless re-consulted.  [address] RN, MSN, [employer]  [phone]

## 2012-04-03 NOTE — Care Management Note (Unsigned)
    Page 1 of 1   04/03/2012     1:54:52 PM   CARE MANAGEMENT NOTE 04/03/2012  Patient:  [name], [name]   Account Number:  [id number]  Date Initiated:  04/03/2012  Documentation initiated by:  Kathi Der  Subjective/Objective Assessment:   78 yo female admitted 04/02/12 with acute CHF     Action/Plan:   D/C when medically stable   Anticipated DC Date:  04/06/2012   Anticipated DC Plan:  HOME W HOME HEALTH SERVICES      DC Planning Services  CM consult      [facility] Choice  HOME HEALTH   Choice offered to / List presented to:  C-1 Patient        HH arranged  HH-1 RN      [facility] agency  [facility]    Per UR Regulation:  Reviewed for med. necessity/level of care/duration of stay  Comments:  04/03/12, Kathi Der RNC-MNN, BSN, [phone], CM received referral.  CM met with pt to offer choice for [facility] services. Pt states she has used [facility] before and would like to use them again.  Marie at [facility] contacted with order and confirmation received.

## 2012-04-04 DIAGNOSIS — I872 Venous insufficiency (chronic) (peripheral): Secondary | ICD-10-CM | POA: Diagnosis present

## 2012-04-04 DIAGNOSIS — I5189 Other ill-defined heart diseases: Secondary | ICD-10-CM | POA: Diagnosis present

## 2012-04-04 DIAGNOSIS — I10 Essential (primary) hypertension: Secondary | ICD-10-CM | POA: Diagnosis present

## 2012-04-04 DIAGNOSIS — E1169 Type 2 diabetes mellitus with other specified complication: Secondary | ICD-10-CM | POA: Diagnosis present

## 2012-04-04 LAB — BASIC METABOLIC PANEL
CO2: 32 mEq/L (ref 19–32)
GFR calc non Af Amer: 41 mL/min — ABNORMAL LOW (ref >90)
Glucose, Bld: 134 mg/dL — ABNORMAL HIGH (ref 70–99)
Potassium: 3.7 mEq/L (ref 3.5–5.1)
Sodium: 142 mEq/L (ref 135–145)

## 2012-04-04 LAB — GLUCOSE, CAPILLARY

## 2012-04-04 LAB — TSH: TSH: 0.954 u[IU]/mL (ref 0.350–4.500)

## 2012-04-04 MED ORDER — ALPRAZOLAM 0.25 MG PO TABS: 0.2500 mg | ORAL_TABLET | Freq: Three times a day (TID) | ORAL | Status: DC | PRN

## 2012-04-04 MED ORDER — METOPROLOL SUCCINATE ER 25 MG PO TB24
25.0000 mg | ORAL_TABLET | Freq: Two times a day (BID) | ORAL | Status: DC
  Administered 2012-04-04 – 2012-04-05 (×3): 25 mg via ORAL
  Filled 2012-04-04 (×5): qty 1

## 2012-04-04 MED ORDER — ZOLPIDEM TARTRATE 5 MG PO TABS: 5.0000 mg | ORAL_TABLET | Freq: Every evening | ORAL | Status: DC | PRN

## 2012-04-04 NOTE — Progress Notes (Signed)
Subjective:  Edema improving.  Objective:  Vital Signs in the last 24 hours: Temp:  [97.4 F (36.3 C)-97.9 F (36.6 C)] 97.6 F (36.4 C) (09/20 0531) Pulse Rate:  [78-90] 78  (09/20 0626) Resp:  [18-24] 20  (09/20 0531) BP: (130-142)/(58-79) 142/79 mmHg (09/20 0531) SpO2:  [94 %-99 %] 94 % (09/20 0531) Weight:  [119.1 kg (262 lb 9.1 oz)] 119.1 kg (262 lb 9.1 oz) (09/20 0531)  Intake/Output from previous day:  Intake/Output Summary (Last 24 hours) at 04/04/12 0839 Last data filed at 04/04/12 0136  Gross per 24 hour  Intake    603 ml  Output   1550 ml  Net   -947 ml    Physical Exam: General appearance: alert, cooperative, no distress and morbidly obese Lungs: clear to auscultation bilaterally Heart: regularly irregular rhythm and 2/6 systolic murmur Extrem: 2+ bilat edema with chronic wounds, scarring, and a dressing on the Rt LE.   Rate: 78  Rhythm: normal sinus rhythm  Lab Results:  Basename 04/02/12 1818  WBC 8.3  HGB 12.9  PLT 222    Basename 04/04/12 0504 04/03/12 0602  NA 142 144  K 3.7 3.8  CL 100 104  CO2 32 33*  GLUCOSE 134* 130*  BUN 19 19  CREATININE 1.25* 1.27*   No results found for this basename: TROPONINI:2,CK,MB:2 in the last 72 hours Hepatic Function Panel No results found for this basename: PROT,ALBUMIN,AST,ALT,ALKPHOS,BILITOT,BILIDIR,IBILI in the last 72 hours No results found for this basename: CHOL in the last 72 hours No results found for this basename: INR in the last 72 hours  Imaging: Imaging results have been reviewed  Cardiac Studies:  Assessment/Plan:   Principal Problem:  *Acute on chronic diastolic CHF (congestive heart failure), NYHA class 3 Active Problems:  Chronic venous insufficiency with LE ulcers,  followed at wound center  Diabetes mellitus type 2 in obese  Diastolic dysfunction stage 2 with an EF of 55% 2D 2012  Pulmonary hypertension  Sleep apnea  Morbid obesity  Aortic insufficiency: mild  HTN  (hypertension)   Plan- 78 y/o followed by different physicians in our practice for several years. She is currently followed by Dr Rennis Golden. Her primary MD is Dr Renae Gloss. She has chronic LE edmema with chronic ulcers followed at would center. She is diuresing on IV Lasix, will continue, check BNP in am. Check TSH (enlarged thyroid noted on prior CT). I'm not sure why she isn't on ACE or ARB, will check office records, she had been on Benicar in the past.    Corine Shelter PA-C 04/04/2012, 8:39 AM   ECHO: - Left ventricle: The cavity size was normal. There was moderate concentric hypertrophy. Systolic function was hyperdynamic. The estimated ejection fraction was in the range of 80% to 85%. Wall motion was normal; there were no regional wall motion abnormalities. Features are consistent with a pseudonormal left ventricular filling pattern, with concomitant abnormal relaxation and increased filling pressure (grade 2 diastolic dysfunction). Doppler parameters are consistent with high ventricular filling pressure. There is moderately severe mid cavity obliteration with apical entrapment and a intraventricular "gradient" of over 4 m/s. - Aortic valve: There is mild subvalvular obstruction due to the hyperdynamic state and mild SAM of the mitral valve. The peak subvalvular gradient is approximately 40 mm Hg at rest. but may be underestimated due to technical limitations. - Mitral valve: Calcified annulus. There was mild systolic anterior motion of the anterior leaflet and chordal structures. Mild regurgitation. - Left atrium: The atrium  was severely dilated. - Right atrium: The atrium was mildly dilated. - Tricuspid valve: Moderate regurgitation. - Pulmonary arteries: Systolic pressure was moderately to severely increased. PA peak pressure: 65mm Hg (S). Impressions:  - Findings suggest hypertrophic cardiomyopathy with mostly mid-cavitary obstruction and a smaller component of LV outflow  tract obstruction. There is evidence of severe decompensation of diastolic heart failure and moderate to severe pulmonary arterial hypertension.   I have seen and examined the patient along with Corine Shelter PA-C.  I have reviewed the chart, notes and new data.  I agree with PA's note.  Key new complaints: breathing better, edema still severe Key examination changes: murmur unchanged; massive edema of the lower extremities Key new findings / data: Echo findings suggest hypertrophic cardiomyopathy with a small degree of LV outflow tract obstruction. The LV is hyperdynamic. There is evidence of severe decompensation of diastolic heart failure and moderate to severe pulmonary arterial hypertension.   PLAN: Echo findings suggest she will benefit from increased beta blocker therapy and discontinuation of direct vasodilators.  Increase metoprolol succinate - will dose twice daily to reach steady state quicker. If bradycardia limits beta blocker dose, gradually dc clonidine. Stop hydralazine and nitrates. Continue diuresis. Pulmonary HTN is likely multifactorial, but decompensated diastolic LV failure is definitely playing a role.   Thurmon Fair, MD, [facility] [phone] 04/04/2012, 12:01 PM

## 2012-04-05 ENCOUNTER — Encounter (HOSPITAL_COMMUNITY): Payer: Self-pay | Admitting: Cardiology

## 2012-04-05 DIAGNOSIS — I421 Obstructive hypertrophic cardiomyopathy: Secondary | ICD-10-CM | POA: Diagnosis present

## 2012-04-05 LAB — GLUCOSE, CAPILLARY
Glucose-Capillary: 154 mg/dL — ABNORMAL HIGH (ref 70–99)
Glucose-Capillary: 156 mg/dL — ABNORMAL HIGH (ref 70–99)
Glucose-Capillary: 124 mg/dL — ABNORMAL HIGH (ref 70–99)

## 2012-04-05 LAB — BASIC METABOLIC PANEL
BUN: 20 mg/dL (ref 6–23)
CO2: 32 mEq/L (ref 19–32)
GFR calc non Af Amer: 37 mL/min — ABNORMAL LOW (ref >90)
Glucose, Bld: 143 mg/dL — ABNORMAL HIGH (ref 70–99)
Potassium: 3.8 mEq/L (ref 3.5–5.1)

## 2012-04-05 MED ORDER — ENOXAPARIN SODIUM 60 MG/0.6ML ~~LOC~~ SOLN
60.0000 mg | SUBCUTANEOUS | Status: DC
  Administered 2012-04-05 – 2012-04-08 (×4): 60 mg via SUBCUTANEOUS
  Filled 2012-04-05 (×5): qty 0.6

## 2012-04-05 NOTE — Progress Notes (Signed)
Subjective: Feels better, no SOB, but continued edema  Objective: Vital signs in last 24 hours: Temp:  [97.1 F (36.2 C)-98.1 F (36.7 C)] 97.5 F (36.4 C) (09/21 1330) Pulse Rate:  [71-78] 71  (09/21 1330) Resp:  [18-20] 18  (09/21 1330) BP: (114-134)/(64-76) 114/64 mmHg (09/21 1330) SpO2:  [95 %-98 %] 98 % (09/21 1330) Weight:  [117.799 kg (259 lb 11.2 oz)] 117.799 kg (259 lb 11.2 oz) (09/21 0523) Weight change: -1.301 kg (-2 lb 13.9 oz) Last BM Date: 04/05/12 Intake/Output from previous day: -1330  Wt. 117.79  Down from 124.5 on admit.  09/20 0701 - 09/21 0700 In: 920 [P.O.:920] Out: 2250 [Urine:2250] Intake/Output this shift: Total I/O In: 580 [P.O.:580] Out: 625 [Urine:625]  PE: General appearance: alert, cooperative, appears stated age, no distress and morbidly obese Neck: no adenopathy, no carotid bruit, supple, symmetrical, trachea midline, thyroid not enlarged, symmetric, no tenderness/mass/nodules and JVP ~ 12cmH20 & pulsatile from TR Lungs: clear to auscultation bilaterally, normal percussion bilaterally and non-labored Heart: regular rate and rhythm, S1: normal, S2: increased intensity and 2nd systolic murmur: systolic ejection 2/6, crescendo and decrescendo at 2nd right intercostal space Abdomen: soft, non-tender, NABS; Obese Extremities: edema continues to have 3+ tense bilateral LEE with chronic venous stasis changes R>L Leg; dressing on 1 of 2 lesions on R leg; SCDs not on Skin: hyperpigmentation - lower leg(s) bilateral and whitish blistering on R leg. Neurologic: Grossly normal    Lab Results:  Basename 04/02/12 1818  WBC 8.3  HGB 12.9  HCT 41.3  PLT 222   BMET  Basename 04/05/12 0530 04/04/12 0504  NA 140 142  K 3.8 3.7  CL 99 100  CO2 32 32  GLUCOSE 143* 134*  BUN 20 19  CREATININE 1.36* 1.25*  CALCIUM 9.4 9.5     Lab Results  Component Value Date   TSH 0.954 04/04/2012  No results found for this basename: PROTIME in the last 72  hours    EKG: Orders placed during the hospital encounter of 04/02/12  . ED EKG  . ED EKG    Studies/Results: 2D Echo: Left ventricle: The cavity size was normal. There was moderate concentric hypertrophy. Systolic function was hyperdynamic. The estimated ejection fraction was in the range of 80% to 85%. Wall motion was normal; there were no regional wall motion abnormalities. Features are consistent with a pseudonormal left ventricular filling pattern, with concomitant abnormal relaxation and increased filling pressure (grade 2 diastolic dysfunction). Doppler parameters are consistent with high ventricular filling pressure. There is moderately severe mid cavity obliteration with apical entrapment and a intraventricular "gradient" of over 4 m/s. - Aortic valve: There is mild subvalvular obstruction due to the hyperdynamic state and mild SAM of the mitral valve. The peak subvalvular gradient is approximately 40 mm Hg at rest. but may be underestimated due to technical limitations. - Mitral valve: Calcified annulus. There was mild systolic anterior motion of the anterior leaflet and chordal structures. Mild regurgitation. - Left atrium: The atrium was severely dilated. - Right atrium: The atrium was mildly dilated. - Tricuspid valve: Moderate regurgitation. - Pulmonary arteries: Systolic pressure was moderately to severely increased. PA peak pressure: 65mm Hg (S).  Impressions: Findings suggest hypertrophic cardiomyopathy with mostly mid-cavitary obstruction and a smaller component of LV outflow tract obstruction. There is evidence of severe decompensation of diastolic heart failure and moderate to severe pulmonary arterial hypertension.   Medications: I have reviewed the patient's current medications.    Marland Kitchen aspirin EC  81 mg Oral Daily  . cloNIDine  0.2 mg Oral BID  . furosemide  80 mg Intravenous BID  . insulin aspart  0-20 Units Subcutaneous TID WC  . metoprolol succinate  25 mg Oral BID   . pantoprazole  40 mg Oral Q1200  . potassium chloride  20 mEq Oral BID  . sodium chloride  3 mL Intravenous Q12H   Assessment/Plan: Principal Problem:  *Acute on chronic diastolic CHF (congestive heart failure), NYHA class 3 Active Problems:  Pulmonary hypertension  Sleep apnea  Morbid obesity  Aortic insufficiency: mild  Chronic venous insufficiency with LE ulcers,  followed at wound center  Diabetes mellitus type 2 in obese  Diastolic dysfunction stage 2 with an EF of 55% 2D 2012  HTN (hypertension)  PLAN:Hydralizine and nitrates d/c'd due to hypertrophic CM.   toprol dose increased   Mild increase of cr.  On Lasix 80 mg IV BID  Pro BNP 2750 down from 3607 Benicar/ hctz was d/c'd by her primary MD.  Pt does not know why.    Continues to improve.  Wound care has seen wound on rt. Lower leg.   LOS: 3 days   INGOLD,LAURA R 04/05/2012, 2:27 PM  I have seen & examined the patient this PM along with Nada Boozer, NP.  I agree with her findings, exam (performed together) & recommendations.  Given Echo findings, most likely Dx is Hypertrophic CM.  Afterload reduction stopped yesterday; had been on ARB as OP, stopped by PCP & Hydralazine/ Nitrates stopped by Dr. Erlinda Hong yesterday after reading the echo.  Still slowly diuresis with Cr slightly increased.  Continue with IV Lasix.  Tolerating BID Metoprolol Succinate - may want to change to once daily 50mg  prior to d/c.  BP has also improved. May want to convert off of Clonidine to allow for restarting ARB. Will not adjust at this time.  Wound care for R Leg - SCDs not placed; SQ Lovenox for DVT prophylaxis.  On SSI.  Overall slow progression -- may need to "back off" diuresis if Cr continues to climb, but still several liters left to diurese.  Marykay Lex, M.D., M.S. [facility] [address] [place], [place]  [address]  [phone] Pager # [phone]  04/05/2012 3:13 PM

## 2012-04-05 NOTE — Progress Notes (Signed)
ANTICOAGULATION CONSULT NOTE - Initial Consult  Pharmacy Consult for lovenox Indication: VTE prophylaxis  No Known Allergies  Patient Measurements: Height: 5\' 6"  (167.6 cm) Weight: 259 lb 11.2 oz (117.799 kg) (scale a) IBW/kg (Calculated) : 59.3    Vital Signs: Temp: 97.5 F (36.4 C) (09/21 1330) Temp src: Oral (09/21 1330) BP: 114/64 mmHg (09/21 1330) Pulse Rate: 71  (09/21 1330)  Labs:  Basename 04/05/12 0530 04/04/12 0504 04/03/12 0602 04/02/12 1818  HGB -- -- -- 12.9  HCT -- -- -- 41.3  PLT -- -- -- 222  APTT -- -- -- --  LABPROT -- -- -- --  INR -- -- -- --  HEPARINUNFRC -- -- -- --  CREATININE 1.36* 1.25* 1.27* --  CKTOTAL -- -- -- --  CKMB -- -- -- --  TROPONINI -- -- -- --    Estimated Creatinine Clearance: 45.9 ml/min (by C-G formula based on Cr of 1.36).   Medical History: Past Medical History  Diagnosis Date  . Hypertension   . Gout   . High cholesterol   . DVT (deep venous thrombosis) 1998; ~ 2002    right; right  . Venous stasis of lower extremity 04/02/2012    BLE  . Aortic insufficiency     mild/H&P 04/02/2012  . Type II diabetes mellitus   . OSA on CPAP   . Pulmonary hypertension   . Heart murmur     "soft"/Dr.Harding at bedside 04/02/2012  . Peripheral vascular disease   . Orthopnea 04/02/2012  . Short of breath on exertion 04/02/2012  . Arthritis     "all over" (04/02/2012)  . CHF (congestive heart failure)     Medications:  Prescriptions prior to admission  Medication Sig Dispense Refill  . aspirin 81 MG tablet Take 160 mg by mouth daily.        . cloNIDine (CATAPRES) 0.2 MG tablet Take 0.2 mg by mouth 2 (two) times daily.        . furosemide (LASIX) 40 MG tablet Take 40 mg by mouth 2 (two) times daily.       Marland Kitchen glimepiride (AMARYL) 4 MG tablet Take 4 mg by mouth daily before breakfast.        . hydrALAZINE (APRESOLINE) 25 MG tablet Take 25 mg by mouth 2 (two) times daily.      . insulin lispro protamine-insulin lispro (HUMALOG  75/25) (75-25) 100 UNIT/ML SUSP Inject 22 Units into the skin daily with breakfast. And 19 units every evening      . nabumetone (RELAFEN) 500 MG tablet Take 500 mg by mouth 2 (two) times daily.        Marland Kitchen omeprazole (PRILOSEC) 20 MG capsule Take 20 mg by mouth daily.          Assessment: 78 yo F with acute on diastolic CHF to start lovenox for VTE px.  Wt = 118 kg. Creat cl 21ml/min.  Goal of Therapy:  Anti-Xa level 0.6-1.2 units/ml 4hrs after LMWH dose given Monitor platelets by anticoagulation protocol: Yes   Plan:  Lovenox 0.5 mg/kg/day = 60 mg sq daily for VTE px. F/u renal fxn and CBC Herby Abraham, Pharm.D. [phone] 04/05/2012 3:23 PM

## 2012-04-06 LAB — BASIC METABOLIC PANEL
Calcium: 9.4 mg/dL (ref 8.4–10.5)
Chloride: 99 mEq/L (ref 96–112)
Creatinine, Ser: 1.55 mg/dL — ABNORMAL HIGH (ref 0.50–1.10)
GFR calc Af Amer: 36 mL/min — ABNORMAL LOW (ref >90)

## 2012-04-06 LAB — GLUCOSE, CAPILLARY
Glucose-Capillary: 151 mg/dL — ABNORMAL HIGH (ref 70–99)
Glucose-Capillary: 131 mg/dL — ABNORMAL HIGH (ref 70–99)

## 2012-04-06 LAB — CBC
HCT: 39.8 % (ref 36.0–46.0)
Hemoglobin: 12.2 g/dL (ref 12.0–15.0)
WBC: 8.3 10*3/uL (ref 4.0–10.5)

## 2012-04-06 LAB — HEPATIC FUNCTION PANEL
Indirect Bilirubin: 0.4 mg/dL (ref 0.3–0.9)
Total Protein: 6.3 g/dL (ref 6.0–8.3)

## 2012-04-06 LAB — PRO B NATRIURETIC PEPTIDE: Pro B Natriuretic peptide (BNP): 2500 pg/mL — ABNORMAL HIGH (ref 0–450)

## 2012-04-06 MED ORDER — FUROSEMIDE 10 MG/ML IJ SOLN
80.0000 mg | Freq: Two times a day (BID) | INTRAMUSCULAR | Status: DC
  Administered 2012-04-06: 80 mg via INTRAVENOUS
  Filled 2012-04-06 (×2): qty 8

## 2012-04-06 MED ORDER — METOPROLOL SUCCINATE ER 25 MG PO TB24
37.5000 mg | ORAL_TABLET | Freq: Two times a day (BID) | ORAL | Status: DC
  Administered 2012-04-06 – 2012-04-09 (×7): 37.5 mg via ORAL
  Filled 2012-04-06 (×10): qty 1

## 2012-04-06 NOTE — Progress Notes (Signed)
Subjective: Feels better, no SOB, but continued edema  Objective: Vital signs in last 24 hours: Temp:  [97.1 F (36.2 C)-97.6 F (36.4 C)] 97.5 F (36.4 C) (09/22 0434) Pulse Rate:  [71-84] 84  (09/22 0434) Resp:  [18-20] 18  (09/22 0434) BP: (114-144)/(64-78) 144/78 mmHg (09/22 0434) SpO2:  [97 %-98 %] 98 % (09/22 0434) Weight:  [117.663 kg (259 lb 6.4 oz)] 117.663 kg (259 lb 6.4 oz) (09/22 0434) Weight change: -0.136 kg (-4.8 oz) Last BM Date: 04/06/12 Intake/Output from previous day: -1330  Wt. 117.79  Down from 124.5 on admit.  09/21 0701 - 09/22 0700 In: 1423 [P.O.:1420; I.V.:3] Out: 1175 [Urine:1175] Intake/Output this shift: Total I/O In: 120 [P.O.:120] Out: -   PE: General appearance: alert, cooperative, appears stated age, no distress and morbidly obese Lungs: clear to auscultation bilaterally, normal percussion bilaterally and non-labored Heart: regular rate and rhythm, S1: normal, S2: increased intensity and 2nd systolic murmur: systolic ejection 2/6, crescendo and decrescendo at 2nd right intercostal space; JVP ~12cmH20 with HJR Abdomen: morbidly obesel unable to assess HSM.  NABS Extremities: edema continues to have 3+ tense bilateral LEE with chronic venous stasis changes R&gt;L Leg; dressing on 1 of 2 lesions on R leg; SCDs not on Skin: hyperpigmentation - lower leg(s) bilateral -- Ace wraps applied to both legs make feet edema more noticeable.  Lab Results:  [facility] 04/06/12 0420  WBC 8.3  HGB 12.2  HCT 39.8  PLT 229   BMET  Basename 04/06/12 0420 04/05/12 0530  NA 141 140  K 4.2 3.8  CL 99 99  CO2 31 32  GLUCOSE 138* 143*  BUN 23 20  CREATININE 1.55* 1.36*  CALCIUM 9.4 9.4     Lab Results  Component Value Date   TSH 0.954 04/04/2012  No results found for this basename: PROTIME in the last 72 hours EKG:  Medications: I have reviewed the patient's current medications.    Marland Kitchen aspirin EC  81 mg Oral Daily  . cloNIDine  0.2 mg Oral BID  .  enoxaparin (LOVENOX) injection  60 mg Subcutaneous Q24H  . furosemide  80 mg Intravenous BID  . insulin aspart  0-20 Units Subcutaneous TID WC  . metoprolol succinate  37.5 mg Oral BID  . pantoprazole  40 mg Oral Q1200  . potassium chloride  20 mEq Oral BID  . sodium chloride  3 mL Intravenous Q12H  . DISCONTD: furosemide  80 mg Intravenous BID  . DISCONTD: metoprolol succinate  25 mg Oral BID   Assessment/Plan: Principal Problem:  *Acute on chronic diastolic CHF (congestive heart failure), NYHA class 3 Active Problems:  Pulmonary hypertension  Sleep apnea  Morbid obesity  Aortic insufficiency: mild  Chronic venous insufficiency with LE ulcers,  followed at wound center  Diabetes mellitus type 2 in obese  Diastolic dysfunction stage 2 with an EF of 55% 2D 2012  HTN (hypertension)  Hypertrophic obstructive cardiomyopathy  Continues to improve.  Wound care has seen wound on rt. Lower leg.   LOS: 4 days   I have seen & examined the patient this PM along with Nada Boozer, NP.  I agree with her findings, exam (performed together) & recommendations.  Given Echo findings, most likely Dx is Hypertrophic CM.  Afterload reduction stopped by Dr. Erlinda Hong.   Continue with IV Lasix., Still slowly diuresis with Cr slightly increased -- will hold this PM dose. Need to allow for re-quillabratin of 3rd spaced fluid back into the venous return.  Concern for pre-renal dehydration & need to avoid underfilling of hypertrophic LV.  Tolerating BID Metoprolol Succinate - BP will still tolerate additional dosing -- increase to 37.5 bid today.  ? Changing from Clonidine to  ARB prior to discharge.  Wound care for R Leg - SCDs not placed; SQ Lovenox for DVT prophylaxis.  On SSI.  Overall slow progression -- may need to "back off" diuresis if Cr continues to climb, but still several liters left to diurese.  Marykay Lex, M.D., M.S. [facility] [address] [place], [place]  [address]  [phone] Pager # [phone]  04/06/2012 9:07 AM

## 2012-04-07 LAB — BASIC METABOLIC PANEL
Calcium: 9.5 mg/dL (ref 8.4–10.5)
GFR calc non Af Amer: 30 mL/min — ABNORMAL LOW (ref >90)
Glucose, Bld: 153 mg/dL — ABNORMAL HIGH (ref 70–99)
Sodium: 140 mEq/L (ref 135–145)

## 2012-04-07 LAB — GLUCOSE, CAPILLARY: Glucose-Capillary: 140 mg/dL — ABNORMAL HIGH (ref 70–99)

## 2012-04-07 MED ORDER — FUROSEMIDE 10 MG/ML IJ SOLN
40.0000 mg | Freq: Two times a day (BID) | INTRAMUSCULAR | Status: DC
  Administered 2012-04-07 – 2012-04-08 (×2): 40 mg via INTRAVENOUS
  Filled 2012-04-07 (×2): qty 4

## 2012-04-07 NOTE — Progress Notes (Signed)
[facility]  DAILY PROGRESS NOTE   Subjective:  I's/O's are matched, BNP still 2500. Creatinine has stabilized in the 1.5-1.6 range. Echo shows a diffuse variant hypertrophic cardiomyopathy with EF 80-85%. Wall thickness is over 2 cm.  Objective:  Temp:  [97.2 F (36.2 C)-97.9 F (36.6 C)] 97.4 F (36.3 C) (09/23 0414) Pulse Rate:  [57-65] 57  (09/23 0414) Resp:  [16-20] 18  (09/23 0414) BP: (106-135)/(50-71) 135/71 mmHg (09/23 0414) SpO2:  [94 %-100 %] 100 % (09/23 0414) Weight:  [119.341 kg (263 lb 1.6 oz)] 119.341 kg (263 lb 1.6 oz) (09/23 0414) Weight change: 1.678 kg (3 lb 11.2 oz)  Intake/Output from previous day: 09/22 0701 - 09/23 0700 In: 1503 [P.O.:1500; I.V.:3] Out: 1450 [Urine:1450]  Intake/Output from this shift: Total I/O In: 120 [P.O.:120] Out: -   Medications: Current Facility-Administered Medications  Medication Dose Route Frequency Provider Last Rate Last Dose  . 0.9 %  sodium chloride infusion  250 mL Intravenous PRN Wilburt Finlay, PA      . acetaminophen (TYLENOL) tablet 650 mg  650 mg Oral Q4H PRN Wilburt Finlay, PA   650 mg at 04/04/12 2130  . ALPRAZolam Prudy Feeler) tablet 0.25 mg  0.25 mg Oral TID PRN Abelino Derrick, PA      . aspirin EC tablet 81 mg  81 mg Oral Daily Wilburt Finlay, PA   81 mg at 04/06/12 1040  . cloNIDine (CATAPRES) tablet 0.2 mg  0.2 mg Oral BID Wilburt Finlay, PA   0.2 mg at 04/06/12 2222  . enoxaparin (LOVENOX) injection 60 mg  60 mg Subcutaneous Q24H Herby Abraham, PHARMD   60 mg at 04/06/12 1721  . furosemide (LASIX) injection 80 mg  80 mg Intravenous BID Marykay Lex, MD   80 mg at 04/06/12 1720  . insulin aspart (novoLOG) injection 0-20 Units  0-20 Units Subcutaneous TID WC Wilburt Finlay, PA   3 Units at 04/07/12 [phone]  . metoprolol succinate (TOPROL-XL) 24 hr tablet 37.5 mg  37.5 mg Oral BID Marykay Lex, MD   37.5 mg at 04/06/12 2222  . ondansetron (ZOFRAN) injection 4 mg  4 mg Intravenous Q6H PRN Wilburt Finlay, PA      . pantoprazole (PROTONIX) EC tablet 40 mg  40 mg Oral Q1200 Wilburt Finlay, PA   40 mg at 04/06/12 1723  . potassium chloride SA (K-DUR,KLOR-CON) CR tablet 20 mEq  20 mEq Oral BID Wilburt Finlay, PA   20 mEq at 04/06/12 2222  . sodium chloride 0.9 % injection 3 mL  3 mL Intravenous Q12H Wilburt Finlay, PA   3 mL at 04/06/12 2223  . sodium chloride 0.9 % injection 3 mL  3 mL Intravenous PRN Wilburt Finlay, PA      . zolpidem (AMBIEN) tablet 5 mg  5 mg Oral QHS PRN Abelino Derrick, PA        Physical Exam: General appearance: alert and no distress Neck: JVD - 3 cm above sternal notch, no adenopathy, no carotid bruit, supple, symmetrical, trachea midline and thyroid not enlarged, symmetric, no tenderness/mass/nodules Lungs: rales bibasilar Heart: regular rate and rhythm, S1, S2 normal, no murmur, click, rub or gallop Abdomen: morbidly obese, soft ,non-tender Extremities: edema 2+ Pulses: 2+ and symmetric  Lab Results: Results for orders placed during the hospital encounter of 04/02/12 (from the past 48 hour(s))  GLUCOSE, CAPILLARY     Status: Abnormal   Collection Time   04/05/12 11:40 AM  Component Value Range Comment   Glucose-Capillary 156 (*) 70 - 99 mg/dL    Comment 1 Notify RN     GLUCOSE, CAPILLARY     Status: Abnormal   Collection Time   04/05/12  4:08 PM      Component Value Range Comment   Glucose-Capillary 124 (*) 70 - 99 mg/dL    Comment 1 Notify RN     GLUCOSE, CAPILLARY     Status: Abnormal   Collection Time   04/05/12  8:45 PM      Component Value Range Comment   Glucose-Capillary 152 (*) 70 - 99 mg/dL   BASIC METABOLIC PANEL     Status: Abnormal   Collection Time   04/06/12  4:20 AM      Component Value Range Comment   Sodium 141  135 - 145 mEq/L    Potassium 4.2  3.5 - 5.1 mEq/L    Chloride 99  96 - 112 mEq/L    CO2 31  19 - 32 mEq/L    Glucose, Bld 138 (*) 70 - 99 mg/dL    BUN 23  6 - 23 mg/dL    Creatinine, Ser 4.09 (*) 0.50 - 1.10 mg/dL    Calcium 9.4   8.4 - 10.5 mg/dL    GFR calc non Af Amer 31 (*) >90 mL/min    GFR calc Af Amer 36 (*) >90 mL/min   HEPATIC FUNCTION PANEL     Status: Abnormal   Collection Time   04/06/12  4:20 AM      Component Value Range Comment   Total Protein 6.3  6.0 - 8.3 g/dL    Albumin 3.0 (*) 3.5 - 5.2 g/dL    AST 21  0 - 37 U/L    ALT 15  0 - 35 U/L    Alkaline Phosphatase 79  39 - 117 U/L    Total Bilirubin 0.6  0.3 - 1.2 mg/dL    Bilirubin, Direct 0.2  0.0 - 0.3 mg/dL    Indirect Bilirubin 0.4  0.3 - 0.9 mg/dL   CBC     Status: Abnormal   Collection Time   04/06/12  4:20 AM      Component Value Range Comment   WBC 8.3  4.0 - 10.5 K/uL    RBC 4.48  3.87 - 5.11 MIL/uL    Hemoglobin 12.2  12.0 - 15.0 g/dL    HCT 81.1  91.4 - 78.2 %    MCV 88.8  78.0 - 100.0 fL    MCH 27.2  26.0 - 34.0 pg    MCHC 30.7  30.0 - 36.0 g/dL    RDW 95.6 (*) 21.3 - 15.5 %    Platelets 229  150 - 400 K/uL   PRO B NATRIURETIC PEPTIDE     Status: Abnormal   Collection Time   04/06/12  4:20 AM      Component Value Range Comment   Pro B Natriuretic peptide (BNP) 2500.0 (*) 0 - 450 pg/mL   GLUCOSE, CAPILLARY     Status: Abnormal   Collection Time   04/06/12  6:28 AM      Component Value Range Comment   Glucose-Capillary 151 (*) 70 - 99 mg/dL   GLUCOSE, CAPILLARY     Status: Abnormal   Collection Time   04/06/12 11:11 AM      Component Value Range Comment   Glucose-Capillary 151 (*) 70 - 99 mg/dL    Comment 1 Notify RN  GLUCOSE, CAPILLARY     Status: Abnormal   Collection Time   04/06/12  4:08 PM      Component Value Range Comment   Glucose-Capillary 131 (*) 70 - 99 mg/dL   GLUCOSE, CAPILLARY     Status: Abnormal   Collection Time   04/06/12  9:07 PM      Component Value Range Comment   Glucose-Capillary 215 (*) 70 - 99 mg/dL    Comment 1 Notify RN      Comment 2 Documented in Chart     BASIC METABOLIC PANEL     Status: Abnormal   Collection Time   04/07/12  6:30 AM      Component Value Range Comment   Sodium 140   135 - 145 mEq/L    Potassium 4.6  3.5 - 5.1 mEq/L    Chloride 101  96 - 112 mEq/L    CO2 31  19 - 32 mEq/L    Glucose, Bld 153 (*) 70 - 99 mg/dL    BUN 26 (*) 6 - 23 mg/dL    Creatinine, Ser 4.09 (*) 0.50 - 1.10 mg/dL    Calcium 9.5  8.4 - 81.1 mg/dL    GFR calc non Af Amer 30 (*) >90 mL/min    GFR calc Af Amer 35 (*) >90 mL/min     Imaging: No results found.  Assessment:  1. Principal Problem: 2.  *Acute on chronic diastolic CHF (congestive heart failure), NYHA class 3 3. Active Problems: 4.  Pulmonary hypertension 5.  Sleep apnea 6.  Morbid obesity 7.  Aortic insufficiency: mild 8.  Chronic venous insufficiency with LE ulcers,  followed at wound center 9.  Diabetes mellitus type 2 in obese 10.  Diastolic dysfunction stage 2 with an EF of 55% 2D 2012 11.  HTN (hypertension) 12.  Hypertrophic obstructive cardiomyopathy 13.   Plan:  1. Echocardiogram indicates probable diffuse variant HCM. Significant diastolic dysfunction with a small LV cavity size. I agree with B-blockade to allow LV filling, however, LV compliance is very poor. Will attempt to restart diuresis today at 40 mg lasix IV BID. Prognosis is poor for this condition. Surgery is not likely to help her outflow gradient and she is not a transplant candidate.   Time Spent Directly with Patient:  15 minutes  Length of Stay:  LOS: 5 days   Chrystie Nose, MD, [facility] Attending Cardiologist [facility]  Alaysha Jefcoat C 04/07/2012, 9:39 AM

## 2012-04-07 NOTE — Progress Notes (Addendum)
Inpatient Diabetes Program Recommendations  AACE/ADA: New Consensus Statement on Inpatient Glycemic Control (2013)  Target Ranges:  Prepandial:   less than 140 mg/dL      Peak postprandial:   less than 180 mg/dL (1-2 hours)      Critically ill patients:  140 - 180 mg/dL    Inpatient Diabetes Program Recommendations Insulin - Basal: Add 70/30 10 units BID  Correction (SSI): Decrease to SENSITIVE (70/30 would be BID with breakfast and supper) ADD: Diabetes Coordinator spoke with patient concerning home regimen insulin doses.  Patient said that her CBGs are controlled better during this hospitalization than at home.  Patient said that she has been having some HYPOglycemic events at home after taking her morning dose of 75/25.   Recommend adding a portion of her premixed to regimen during hospitalization to see how she tolerates.  Home dose likely needs adjustment.    Thank you  Piedad Climes [facility] Inpatient Diabetes Coordinator [phone]

## 2012-04-07 NOTE — Progress Notes (Signed)
Pt. States she is ok with placing her own cpap on. Pt. States if she needs assistance she will notify.

## 2012-04-08 ENCOUNTER — Encounter (HOSPITAL_COMMUNITY): Payer: Self-pay | Admitting: Cardiology

## 2012-04-08 LAB — BASIC METABOLIC PANEL
BUN: 29 mg/dL — ABNORMAL HIGH (ref 6–23)
Calcium: 9.6 mg/dL (ref 8.4–10.5)
GFR calc non Af Amer: 30 mL/min — ABNORMAL LOW (ref >90)
Glucose, Bld: 136 mg/dL — ABNORMAL HIGH (ref 70–99)

## 2012-04-08 LAB — GLUCOSE, CAPILLARY: Glucose-Capillary: 141 mg/dL — ABNORMAL HIGH (ref 70–99)

## 2012-04-08 MED ORDER — FUROSEMIDE 40 MG PO TABS
40.0000 mg | ORAL_TABLET | Freq: Two times a day (BID) | ORAL | Status: DC
  Administered 2012-04-08 – 2012-04-09 (×2): 40 mg via ORAL
  Filled 2012-04-08 (×4): qty 1

## 2012-04-08 MED ORDER — INSULIN ASPART PROT & ASPART (70-30 MIX) 100 UNIT/ML ~~LOC~~ SUSP
15.0000 [IU] | Freq: Every day | SUBCUTANEOUS | Status: DC
  Administered 2012-04-08: 15 [IU] via SUBCUTANEOUS
  Filled 2012-04-08: qty 3

## 2012-04-08 MED ORDER — INSULIN ASPART PROT & ASPART (70-30 MIX) 100 UNIT/ML ~~LOC~~ SUSP
18.0000 [IU] | Freq: Every day | SUBCUTANEOUS | Status: DC
  Administered 2012-04-09: 18 [IU] via SUBCUTANEOUS
  Filled 2012-04-08: qty 10
  Filled 2012-04-08: qty 3

## 2012-04-08 MED ORDER — INSULIN ASPART 100 UNIT/ML ~~LOC~~ SOLN
0.0000 [IU] | Freq: Three times a day (TID) | SUBCUTANEOUS | Status: DC
  Administered 2012-04-08 (×2): 1 [IU] via SUBCUTANEOUS

## 2012-04-08 NOTE — Clinical Documentation Improvement (Signed)
RENAL FAILURE DOCUMENTATION CLARIFICATION QUERY  THIS DOCUMENT IS NOT A PERMANENT PART OF THE MEDICAL RECORD   Please update your documentation within the medical record to reflect your response to this query.                                                                                    04/08/12  Dr. Rennis Golden and/or Associates,  In a better effort to capture your patient's severity of illness, reflect appropriate length of stay and utilization of resources, a review of the patient medical record has revealed the following indicator:  BUN/CR/GFR Trend this Admission   (black female) Component     Latest Ref Rng 04/02/2012 04/03/2012 04/04/2012 04/05/2012  BUN     6 - 23 mg/dL 19 19 19 20   Creatinine     0.50 - 1.10 mg/dL 2.95 (H) 2.84 (H) 1.32 (H) 1.36 (H)  GFR calc Af Amer     >90 mL/min 52 (L) 46 (L) 47 (L) 43 (L)   Component     Latest Ref Rng 04/06/2012 04/07/2012 04/08/2012  BUN     6 - 23 mg/dL 23 26 (H) 29 (H)  Creatinine     0.50 - 1.10 mg/dL 4.40 (H) 1.02 (H) 7.25 (H)  GFR calc Af Amer     >90 mL/min 36 (L) 35 (L) 34 (L)    BUN/Cr/GFR Trend in CHL from 2011   (black female) Component     Latest Ref Rng 12/02/2009 01/04/2010  BUN     6 - 23 mg/dL 19 18  Creatinine     3.66 - 1.10 mg/dL 4.40 (H) 3.47 (H)  GFR calc Af Amer     >90 mL/min 50        (L) . . . 44        (L) . . .      "Change to po lasix 40 mg BID tonight. Possible d/c home tomorrow if creatinine continues to however around 1.6." Chrystie Nose, MD, [facility] 04/08/12   Progress Note   History of Diabetes and Hypertension  Admitted with Acute on Chronic Diastolic Heart Failure and treated with IV Lasix  Serial Chemistries   Based on your clinical judgment, please document in the progress notes and discharge summary if a condition below provides greater specificity regarding the patient's renal function this admission:   - Acute Kidney Injury on Stage 3 CKD   - Acute Kidney Injury   - Other  Condition   - Unable to Clinically Determine   In responding to this query please exercise your independent judgment.    The fact that a query is asked, does not imply that any particular answer is desired or expected.     Reviewed: 04/09/12 - stage 3 ckd pn 04/09/12 and dc summary.  Mathis Dad RN  Thank You,  Jerral Ralph  RN BSN CCDS Certified Clinical Documentation Specialist: Cell   [phone]  Health Information Management [place]  TO RESPOND TO THE THIS QUERY, FOLLOW THE INSTRUCTIONS BELOW:  1. If needed, update documentation for the patient's encounter via the notes activity.  2. Access this query again and click  edit on the In [employer].  3. After updating, or not, click F2 to complete all highlighted (required) fields concerning your review. Select "additional documentation in the medical record" OR "no additional documentation provided".  4. Click Sign note button.  5. The deficiency will fall out of your In Basket *Please let us know if you are not able to complete this workflow by phone or e-mail (listed below).

## 2012-04-08 NOTE — Progress Notes (Signed)
Pt. Seen and examined. Agree with the NP/PA-C note as written.  Probably close to diuretic endpoint. Creatinine seems to be hovering around a new baseline. Some of her lower extremity edema is venous stasis and lymphedema which will be slow if not impossible to diurese. Change to po lasix 40 mg BID tonight. Possible d/c home tomorrow if creatinine continues to however around 1.6.  Chrystie Nose, MD, [facility] Attending Cardiologist [facility]

## 2012-04-08 NOTE — Progress Notes (Signed)
Subjective: No SOB, continues with edema of lower ext.  She would like to wear her stockings from home  Objective: Vital signs in last 24 hours: Temp:  [97.3 F (36.3 C)-97.5 F (36.4 C)] 97.5 F (36.4 C) (09/24 0402) Pulse Rate:  [63-68] 64  (09/24 0402) Resp:  [18-19] 19  (09/24 0402) BP: (127-143)/(53-69) 127/66 mmHg (09/24 0402) SpO2:  [98 %-100 %] 98 % (09/24 0402) Weight:  [118.842 kg (262 lb)] 118.842 kg (262 lb) (09/24 0402) Weight change: -0.499 kg (-1 lb 1.6 oz) Last BM Date: 04/07/12 Intake/Output from previous day: -110 09/23 0701 - 09/24 0700 In: 840 [P.O.:840] Out: 950 [Urine:950] Intake/Output this shift: Total I/O In: 120 [P.O.:120] Out: 175 [Urine:175]  PE: General:alert and oriented, MAE, follows commands Heart:S1S2 RRR no MGRC Lungs:few crackles in bases  ZOX:WRUEAV, soft non tender  Ext:3+ edema of lower ext. And edema of feet, ACE wraps in place Neuro:alert and oriented   Lab Results:  [facility] 04/06/12 0420  WBC 8.3  HGB 12.2  HCT 39.8  PLT 229   BMET  Basename 04/08/12 0540 04/07/12 0630  NA 142 140  K 4.8 4.6  CL 103 101  CO2 32 31  GLUCOSE 136* 153*  BUN 29* 26*  CREATININE 1.62* 1.59*  CALCIUM 9.6 9.5     Lab Results  Component Value Date   TSH 0.954 04/04/2012    Hepatic Function Panel  Basename 04/06/12 0420  PROT 6.3  ALBUMIN 3.0*  AST 21  ALT 15  ALKPHOS 79  BILITOT 0.6  BILIDIR 0.2  IBILI 0.4     EKG: Orders placed during the hospital encounter of 04/02/12  . ED EKG  . ED EKG    Studies/Results: No results found.  Medications: I have reviewed the patient's current medications.    Marland Kitchen aspirin EC  81 mg Oral Daily  . cloNIDine  0.2 mg Oral BID  . enoxaparin (LOVENOX) injection  60 mg Subcutaneous Q24H  . furosemide  40 mg Intravenous BID  . insulin aspart  0-20 Units Subcutaneous TID WC  . metoprolol succinate  37.5 mg Oral BID  . pantoprazole  40 mg Oral Q1200  . potassium chloride  20 mEq Oral BID  .  sodium chloride  3 mL Intravenous Q12H  . DISCONTD: furosemide  80 mg Intravenous BID   Assessment/Plan: Principal Problem:  *Acute on chronic diastolic CHF (congestive heart failure), NYHA class 3 Active Problems:  Pulmonary hypertension  Sleep apnea  Morbid obesity  Aortic insufficiency: mild  Chronic venous insufficiency with LE ulcers,  followed at wound center  Diabetes mellitus type 2 in obese  Diastolic dysfunction stage 2 with an EF of 55% 2D 2012  HTN (hypertension)  Hypertrophic obstructive cardiomyopathy  PLAN:Wt decreased from yesterday but still above her low of 117.6, cr continues to rise.  Pro BNP down from 3607 to 2500. Hypoglycemic at home will resume lower dose of home insulin and decrease SSI to follow here in hospital.  On IV lasix BID 40 mg.  LOS: 6 days   [name] R 04/08/2012, 9:35 AM

## 2012-04-09 LAB — BASIC METABOLIC PANEL
Chloride: 102 mEq/L (ref 96–112)
Creatinine, Ser: 1.69 mg/dL — ABNORMAL HIGH (ref 0.50–1.10)
GFR calc Af Amer: 33 mL/min — ABNORMAL LOW (ref >90)
Potassium: 4.7 mEq/L (ref 3.5–5.1)
Sodium: 141 mEq/L (ref 135–145)

## 2012-04-09 LAB — PRO B NATRIURETIC PEPTIDE: Pro B Natriuretic peptide (BNP): 2461 pg/mL — ABNORMAL HIGH (ref 0–450)

## 2012-04-09 LAB — GLUCOSE, CAPILLARY

## 2012-04-09 MED ORDER — POTASSIUM CHLORIDE CRYS ER 20 MEQ PO TBCR: 20.0000 meq | EXTENDED_RELEASE_TABLET | Freq: Two times a day (BID) | ORAL | Status: DC

## 2012-04-09 MED ORDER — INSULIN LISPRO PROT & LISPRO (75-25 MIX) 100 UNIT/ML ~~LOC~~ SUSP: SUBCUTANEOUS | Status: DC

## 2012-04-09 MED ORDER — METOPROLOL SUCCINATE ER 25 MG PO TB24: 37.5000 mg | ORAL_TABLET | Freq: Two times a day (BID) | ORAL | Status: DC

## 2012-04-09 MED ORDER — METOLAZONE 2.5 MG PO TABS: 2.5000 mg | ORAL_TABLET | ORAL | Status: DC

## 2012-04-09 MED ORDER — METOLAZONE 2.5 MG PO TABS
2.5000 mg | ORAL_TABLET | ORAL | Status: DC
  Administered 2012-04-09: 2.5 mg via ORAL
  Filled 2012-04-09: qty 1

## 2012-04-09 NOTE — Progress Notes (Signed)
Subjective: No complaints, no chest pain, mild SOB, more to baseline  Objective: Vital signs in last 24 hours: Temp:  [97.2 F (36.2 C)-97.5 F (36.4 C)] 97.5 F (36.4 C) (09/25 0800) Pulse Rate:  [57-63] 62  (09/25 0800) Resp:  [17-20] 18  (09/25 0800) BP: (104-129)/(51-79) 129/62 mmHg (09/25 0800) SpO2:  [97 %-100 %] 100 % (09/25 0800) Weight:  [118.752 kg (261 lb 12.8 oz)] 118.752 kg (261 lb 12.8 oz) (09/25 0602) Weight change: -0.091 kg (-3.2 oz) Last BM Date: 04/09/12 Intake/Output from previous day:  +225  Wt 118.75 stable but down from 124.5 on admit 09/24 0701 - 09/25 0700 In: 1080 [P.O.:1080] Out: 825 [Urine:825] Intake/Output this shift: Total I/O In: 120 [P.O.:120] Out: -   PE: General:alert and oriented, pleasant affect Neck:no JVD Heart:S1S2 RRR Lungs:clear without rales rhonchi or wheezes Abd:+ BS, soft,nontender  ZOX:WRUEAVWUJ with 2+ edema and swollen feet, now with her stockings on  Neuro:alert and oriented   Lab Results: No results found for this basename: WBC:2,HGB:2,HCT:2,PLT:2 in the last 72 hours BMET  Basename 04/09/12 0600 04/08/12 0540  NA 141 142  K 4.7 4.8  CL 102 103  CO2 31 32  GLUCOSE 114* 136*  BUN 29* 29*  CREATININE 1.69* 1.62*  CALCIUM 9.5 9.6    EKG: Orders placed during the hospital encounter of 04/02/12  . ED EKG  . ED EKG    Studies/Results: No results found.  Medications: I have reviewed the patient's current medications.    Marland Kitchen aspirin EC  81 mg Oral Daily  . cloNIDine  0.2 mg Oral BID  . enoxaparin (LOVENOX) injection  60 mg Subcutaneous Q24H  . furosemide  40 mg Oral BID  . insulin aspart  0-9 Units Subcutaneous TID WC  . insulin aspart protamine-insulin aspart  15 Units Subcutaneous Q supper  . insulin aspart protamine-insulin aspart  18 Units Subcutaneous Q breakfast  . metoprolol succinate  37.5 mg Oral BID  . pantoprazole  40 mg Oral Q1200  . potassium chloride  20 mEq Oral BID  . sodium chloride  3 mL  Intravenous Q12H  . DISCONTD: furosemide  40 mg Intravenous BID  . DISCONTD: insulin aspart  0-20 Units Subcutaneous TID WC   Assessment/Plan: Principal Problem:  *Acute on chronic diastolic CHF (congestive heart failure), NYHA class 3 Active Problems:  Pulmonary hypertension  Sleep apnea  Morbid obesity  Aortic insufficiency: mild  Chronic venous insufficiency with LE ulcers,  followed at wound center  Diabetes mellitus type 2 in obese  Diastolic dysfunction stage 2 with an EF of 55% 2D 2012  HTN (hypertension)  Hypertrophic obstructive cardiomyopathy  CKD (chronic kidney disease) stage 3, GFR 30-59 ml/min  PLAN: pro bnp 2461 down from 3607 on admit  Total negative 3973  Now on lasix 40 mg BID.   VS stable Glucose is better on home dosing.  ? Ambulate with rehab if stable ? D/c home and follow up in 1 week. If edema increasing on office visit then increase lasix or add zaroxolyn 2- 3 days a week.   LOS: 7 days   INGOLD,LAURA R 04/09/2012, 9:21 AM  I've seen and evaluated the patient along with Nada Boozer, NP. I agree with her findings, examination and initial recommendations.  Was seen to reached an impasse as far as her diuresis. Her renal function is relatively stable at creatinine about 1.6, and I doubt orbital diuresis her much more without noticing a further increase. Part of the issue trying  to diurese chronic lower 3 edema from chronic renal insufficiency and right-sided heart failure with pulmonary hypertension and lymphedema. At this point I think it every other day dosing of Zaroxolyn may very well be beneficial the current dose of oral Lasix.  She is also doing better with her home compression stockings.   with her being on oral medications, I feel that we have reached the time or she should be fine for discharge if she is able in weight with rehabilitation. Agree with Ms. Ingold's plan to discharge with close follow up with Dr. Rennis Golden. She'll also need to have  continued wound care management of her feet chronic venous stasis wounds.  Unfortunately long-term prognosis with her severe hypertrophic cardiomyopathy is relatively poor.  Marykay Lex, M.D., M.S. [facility] [address] [place], [place]  [address]  [phone] Pager # [phone]  04/09/2012 12:02 PM

## 2012-04-09 NOTE — Discharge Summary (Signed)
Physician Discharge Summary  Patient ID: [name] MRN: [id number] DOB/AGE: Mar 13, 1934 78 y.o.  Admit date: 04/02/2012 Discharge date: 04/09/2012  Discharge Diagnoses:  Principal Problem:  *Acute on chronic diastolic CHF (congestive heart failure), NYHA class 3 Active Problems:  Pulmonary hypertension  Sleep apnea on c-pap   Morbid obesity  Aortic insufficiency: mild  Chronic venous insufficiency with LE ulcers, will be followed at wound center  Diabetes mellitus type 2 in obese  Diastolic dysfunction stage 2 with an EF of 55% 2D 2012  HTN (hypertension)  Hypertrophic obstructive cardiomyopathy  CKD (chronic kidney disease) stage 3, GFR 30-59 ml/min   Discharged Condition: good  Admission Weight 124.5 kg-274.9 pounds Discharge weight 118.7 kg-261.12 pounds Discharge proBNP 2461  Hospital Course: 78 yo morbidly obese female with a history of DM, HTN, Chronic LEE, pulmonary HTN, OSA-on CPAP, DM and mild aortic insufficiency. Her last echo was in 2012 and revealed an EF > 55%, intracavitary gradient with stage two diastolic dysfunction and elevated left atrial pressure. RVSP of . She had a Low Risk, non-ischemic Myoview ST in 11/2010.  The patient presented to the office with SOB, four pillow orthopnea, PND, severe LEE. She has been SOB for two months and progressively getting worse. She denies N, V, fever, palpitations, CP, dizziness, Abd pain, dysuria, hematuria, hematochezia.  Apparently over the last couple months she has increased her Lasix has not had a significant response. She is admitted to telemetry for IV diuretic therapy 2-D echo was ordered.  Patient began diuresing she was ordered for STD stockings for DVT prophylaxis. Her 2-D echo was done--  - Findings suggest hypertrophic cardiomyopathy with mostly mid-cavitary obstruction and a smaller component of LV outflow tract obstruction. There is evidence of severe decompensation of diastolic heart failure and moderate  to severe pulmonary arterial hypertension.  EF 80-85% grade 2 diastolic dysfunction mild mitral regurg pulmonary artery PA pressure 65 mmHg.  Due to the diagnosis of the hypertrophic cardiomyopathy her hydralazine was DC'd and patient was placed on beta blocker. Once patient's creatinine started to climb, her IV Lasix was decreased.     Her Pro BNP did improve And she was able to ambulate without complications.  At time of discharge she continued with edema of her lower extremities she was wearing support stockings we added Zaroxolyn to her medical regimen to take every other day.  Additionally she had venous stasis ulcers on her right lower extremity these were dressed and she'll followup with wound clinic for further management.  She has seen her in clinic in the past but was no longer being followed by them.  At discharge her shortness of breath was mild but back to baseline her creatinine was 1.6.  Follow Dr. Rennis Golden to discuss further management and prognosis.  Consults: None  Significant Diagnostic Studies:  BMET    Component Value Date/Time   NA 141 04/09/2012 0600   K 4.7 04/09/2012 0600   CL 102 04/09/2012 0600   CO2 31 04/09/2012 0600   GLUCOSE 114* 04/09/2012 0600   BUN 29* 04/09/2012 0600   CREATININE 1.69* 04/09/2012 0600   CALCIUM 9.5 04/09/2012 0600   GFRNONAA 28* 04/09/2012 0600   GFRAA 33* 04/09/2012 0600    CBC    Component Value Date/Time   WBC 8.3 04/06/2012 0420   RBC 4.48 04/06/2012 0420   HGB 12.2 04/06/2012 0420   HCT 39.8 04/06/2012 0420   PLT 229 04/06/2012 0420   MCV 88.8 04/06/2012 0420   MCH  27.2 04/06/2012 0420   MCHC 30.7 04/06/2012 0420   RDW 17.3* 04/06/2012 0420   LYMPHSABS 0.9 01/04/2010 2215   MONOABS 0.4 01/04/2010 2215   EOSABS 0.3 01/04/2010 2215   BASOSABS 0.0 01/04/2010 2215     Discharge Exam: Blood pressure 126/50, pulse 62, temperature 97.5 F (36.4 C), temperature source Oral, resp. rate 18, height 5\' 6"  (1.676 m), weight 118.752 kg (261 lb 12.8  oz), SpO2 100.00%.   PE: General:alert and oriented, pleasant affect  Neck:no JVD  Heart:S1S2 RRR  Lungs:clear without rales rhonchi or wheezes  Abd:+ BS, soft,nontender  WUJ:[id number] with 2+ edema and swollen feet, now with her stockings on  Neuro:alert and oriented  Disposition: home    Medication List     As of 04/09/2012  2:48 PM    STOP taking these medications         hydrALAZINE 25 MG tablet   Commonly known as: APRESOLINE      TAKE these medications         aspirin 81 MG tablet   Take 160 mg by mouth daily.      cloNIDine 0.2 MG tablet   Commonly known as: CATAPRES   Take 0.2 mg by mouth 2 (two) times daily.      furosemide 40 MG tablet   Commonly known as: LASIX   Take 40 mg by mouth 2 (two) times daily.      glimepiride 4 MG tablet   Commonly known as: AMARYL   Take 4 mg by mouth daily before breakfast.      insulin lispro protamine-insulin lispro (75-25) 100 UNIT/ML Susp   Commonly known as: HUMALOG 75/25   And 15 units every evening      metolazone 2.5 MG tablet   Commonly known as: ZAROXOLYN   Take 1 tablet (2.5 mg total) by mouth every other day.      metoprolol succinate 25 MG 24 hr tablet   Commonly known as: TOPROL-XL   Take 1.5 tablets (37.5 mg total) by mouth 2 (two) times daily.      nabumetone 500 MG tablet   Commonly known as: RELAFEN   Take 500 mg by mouth 2 (two) times daily.      omeprazole 20 MG capsule   Commonly known as: PRILOSEC   Take 20 mg by mouth daily.      potassium chloride SA 20 MEQ tablet   Commonly known as: K-DUR,KLOR-CON   Take 1 tablet (20 mEq total) by mouth 2 (two) times daily.           Follow-up Information    Follow up with Chrystie Nose, MD. On 04/14/2012. (the office will call with date and time)    Contact information:   [address] [place] [address] [phone]        Discharge instructions: Weigh daily and call the office for weight gain of  3 pounds or more in a day  or 5 pounds in a week  Low salt diet diabetic diet   raise your legs instead of sitting with your legs down as much as possible  Have blood work done on Monday to check you electrolytes  Wear you support stockings.  We will schedule you an appointment with the wound clinic, the office will call with date and time.  In the mean time clean with soap and water and apply bandage to protect them. Continue CPap   folow up with Dr. Renae Gloss  Signed: Nada Boozer R  04/09/2012, 2:48 PM  I saw [name] today prior to her discharge.  I agree with Corliss Blacker d/c summary.  She was doing quite well.  Her Cr is "hovering around 1.6".  As per Dr. Blanchie Dessert plan, she is for discharge today.  We will add q.o.d. Zaroxolyn for additional diuresis. She will need OP BMP check as noted above.

## 2012-04-09 NOTE — Progress Notes (Signed)
Pt d/c to home with husband. D/c instructions and medications reviewed with Pt. Pt states understanding. All Pt questions answered

## 2012-04-09 NOTE — Progress Notes (Signed)
CARDIAC REHAB PHASE I   PRE:  Rate/Rhythm: 61 SR    BP: sitting 126/52    SaO2: 98 RA  MODE:  Ambulation: 220 ft   POST:  Rate/Rhythm: 81 SR    BP: sitting 130/76     SaO2: 96 RA  Pt fairly independent. Able to use BSC, clean herself, don undergarments and gown independently before walk. Not overtly SOB. Did not c/o. Used her cane to walk with supervision. Slight SOB, she sts it is not bad. Her main c/o was lateral right thigh pain, one particular spot. Sts it gets worse with distance. x1 rest break. Overall did well. Encouraged walking multiple times a day at home. Discussed HF, daily weights, low sodium. Voiced understanding.  [id number]  Harriet Masson CES, ACSM

## 2012-04-14 ENCOUNTER — Encounter (HOSPITAL_BASED_OUTPATIENT_CLINIC_OR_DEPARTMENT_OTHER): Payer: Medicare Other | Attending: Plastic Surgery

## 2012-04-14 DIAGNOSIS — L97509 Non-pressure chronic ulcer of other part of unspecified foot with unspecified severity: Secondary | ICD-10-CM | POA: Insufficient documentation

## 2012-04-15 NOTE — Progress Notes (Signed)
[facility]  NAME:  [name], [name]                 ACCOUNT NO.:  [id number]  MEDICAL RECORD NO.:  [id number]      DATE OF BIRTH:  [birth year]  PHYSICIAN:  Wayland Denis, DO       VISIT DATE:  04/14/2012                                  OFFICE VISIT   The patient is here for followup on her right lower extremity ulcer. She is 78 years old.  She had a ulcer that we got healed, but then she developed a severe very large posterior right leg ulcer that has since healed as well.  Recently, she was admitted to the hospital for heart failure and underwent diuresis with marked improvement in her overall edema.  She has very small area of superficial breakdown on the anterior aspect of her right foot.  The posterior portion has healed completely.  MEDICATIONS:  Unchanged.  SOCIAL HISTORY:  Unchanged.  PHYSICAL EXAMINATION:  She is very pleasant.  Her upper extremity pulses are strong and regular.  Pupils are equal.  Extraocular muscles are intact.  Her breathing is unlabored.  Her heart is regular.  Her abdomen is soft.  The wound is described above.  We will do SilverCel on the area with lotion on the posterior aspect and her stocking.  We will see her back in a week.     Wayland Denis, DO     CS/MEDQ  D:  04/14/2012  T:  04/15/2012  Job:  [id number]

## 2012-05-12 ENCOUNTER — Encounter (HOSPITAL_BASED_OUTPATIENT_CLINIC_OR_DEPARTMENT_OTHER): Payer: Medicare Other | Attending: Plastic Surgery

## 2012-05-30 ENCOUNTER — Other Ambulatory Visit: Payer: Self-pay | Admitting: Orthopedic Surgery

## 2012-06-06 ENCOUNTER — Encounter (HOSPITAL_BASED_OUTPATIENT_CLINIC_OR_DEPARTMENT_OTHER): Payer: Self-pay | Admitting: *Deleted

## 2012-06-09 ENCOUNTER — Encounter (HOSPITAL_BASED_OUTPATIENT_CLINIC_OR_DEPARTMENT_OTHER): Payer: Self-pay | Admitting: *Deleted

## 2012-06-09 NOTE — Progress Notes (Addendum)
NPO AFTER MN. ARRIVES AT 0600. NEEDS ISTAT. CURRENT EKG, LOV NOTE, AND STRESS TEST TO BE FAXED FROM DR HILTY. WILL TAKE PRILOSEC, METOPROLOL, AND CLONIDINE AM OF SURG W/ SIP OF WATER. PT VERBALIZED UNDERSTANDING TO STOP ASA ON WED. 06-11-2012.  WHEN FAX RECEIVED WILL REVIEW W/ MDA.  REVIEWED CHART W/ DR DENNENY MDA, INCLUDING INFO FAXED, OK TO PROCEED.  PT TO BE ASSESSED DOS BY MDA , ? CXR. PER DR DENNENEY NO NEED TO ORDER AT THIS TIME.

## 2012-06-15 NOTE — H&P (Signed)
[name] is an 78 y.o. female.   Chief Complaint: Right thumb pain HPI: This patient is a 78 year old female who presents for evaluation of right hand pain and swelling.  It is around the region of the [facility] joint, severe and sharp accompanied by some weakness and worsened with activity.  She has tried some type of a brace which she has with her but is not specifically a thumb spica splint.  She has not used oral medicines or had an injection.  She denies any locking or catching.  An US-guided injection only provided transient relief of sxs.  Past Medical History  Diagnosis Date  . Hypertension   . Venous stasis of lower extremity WEARS TEDS    BLE--  CURRENTLY NO ULCERS/ WOUNDS  . Aortic insufficiency     mild/H&P 04/02/2012  . Type II diabetes mellitus   . OSA on CPAP   . Pulmonary hypertension per last echo 03-28-2012  mod.-severe     per dr wert note this related to L ht pressures from diastolic chf  . Peripheral vascular disease   . Hypertrophic obstructive cardiomyopathy   . Arthritis   . CKD (chronic kidney disease) stage 3, GFR 30-59 ml/min   . Heart murmur   . History of DVT of lower extremity lower right leg nov 2012  . Hyperlipidemia   . History of gout 06-09-2012  per pt stable  . Chronic diastolic CHF (congestive heart failure), NYHA class 3 CARDIOLOGIST- DR HILTY--  LOV OCT 2013--  REQUESTED NOTE, EGK AND STRESS TEST    PER PT ON 06-09-2012  S&S OF CHF AND WT IS DOWN  . H/O hiatal hernia   . GERD (gastroesophageal reflux disease)   . Pain of right thumb TMC  -- OSTEOARTHRITIS  . Lower extremity edema     Past Surgical History  Procedure Date  . Total knee arthroplasty 1988; 1998 X2    right; bilateral  . Tumor excision 1970's    "off my stomach"  . Cataract extraction w/ intraocular lens implant ~ 2010    right; "put lens in; didn't work; had mass on my eye"  . Transthoracic echocardiogram 04-03-2012   DR KENNETH HILTY    LVSF  EF 80-85%/ HYPERTRONIC  CARDIOMYOPATHY WITH MOSTLY MID-CAVITARY OBSTRUCTION AND A SMALLER COMPONENT OF LV OUTFLOW TRACT OBSTRUCTION/   EVIDENCE OF SEVERE DECOMPRESATION OF DIASTOLIC HEART FAILURE AND MODERATE TO SEVERE PULMONARY ARTERIAL HYPERTENSION  . Joint replacement     TOTAL RIGHT KNEE X2  1988  &  1998 (DUE TO JOINT MALFUNCTION)   TOTAL LEFT KNEE 1998    History reviewed. No pertinent family history. Social History:  reports that she has never smoked. She has never used smokeless tobacco. She reports that she does not drink alcohol or use illicit drugs.  Allergies: No Known Allergies  No prescriptions prior to admission    No results found for this or any previous visit (from the past 48 hour(s)). No results found.  Review of Systems  All other systems reviewed and are negative.   Height 5\' 6"  (1.676 m), weight 105.235 kg (232 lb). Physical Exam  Constitutional:  WD, WN, NAD HEENT:  NCAT, EOMI Neuro/Psych:  Alert & oriented to person, place, and time; appropriate mood & affect Lymphatic: No generalized UE edema or lymphadenopathy Extremities / MSK:  Both UE are normal with respect to appearance, ranges of motion, joint stability, muscle strength/tone, sensation, & perfusion except as otherwise noted:  Right thumb perhaps  slightly swollen at its base with exquisite tenderness at the [facility] joint, worsened with TMC stress and grind.  No pain with palpation over the A1 pulley and no locking or catching  Assessment/Plan Assessment:  Right TMC osteoarthrosis  Plan: Having failed non-operative management, we have elected to proceed with a right thumb suspension arthroplasty (LRTI).  G/B/R reviewed, consent obtained.  Dailyn Reith A. 06/15/2012, 11:28 PM

## 2012-06-16 ENCOUNTER — Encounter (HOSPITAL_BASED_OUTPATIENT_CLINIC_OR_DEPARTMENT_OTHER): Payer: Self-pay | Admitting: Anesthesiology

## 2012-06-16 ENCOUNTER — Ambulatory Visit (HOSPITAL_BASED_OUTPATIENT_CLINIC_OR_DEPARTMENT_OTHER)
Admission: RE | Admit: 2012-06-16 | Discharge: 2012-06-16 | Disposition: A | Discharge status: Home / Self Care | Payer: Medicare Other | Source: Ambulatory Visit | Attending: Orthopedic Surgery | Admitting: Orthopedic Surgery

## 2012-06-16 ENCOUNTER — Ambulatory Visit (HOSPITAL_BASED_OUTPATIENT_CLINIC_OR_DEPARTMENT_OTHER): Payer: Medicare Other | Admitting: Anesthesiology

## 2012-06-16 ENCOUNTER — Encounter (HOSPITAL_BASED_OUTPATIENT_CLINIC_OR_DEPARTMENT_OTHER)
Admission: RE | Disposition: A | Discharge status: Home / Self Care | Payer: Self-pay | Source: Ambulatory Visit | Attending: Orthopedic Surgery

## 2012-06-16 ENCOUNTER — Encounter (HOSPITAL_BASED_OUTPATIENT_CLINIC_OR_DEPARTMENT_OTHER): Payer: Self-pay | Admitting: *Deleted

## 2012-06-16 DIAGNOSIS — Z86718 Personal history of other venous thrombosis and embolism: Secondary | ICD-10-CM | POA: Insufficient documentation

## 2012-06-16 DIAGNOSIS — K219 Gastro-esophageal reflux disease without esophagitis: Secondary | ICD-10-CM | POA: Insufficient documentation

## 2012-06-16 DIAGNOSIS — M19049 Primary osteoarthritis, unspecified hand: Secondary | ICD-10-CM | POA: Insufficient documentation

## 2012-06-16 DIAGNOSIS — E785 Hyperlipidemia, unspecified: Secondary | ICD-10-CM | POA: Insufficient documentation

## 2012-06-16 DIAGNOSIS — I129 Hypertensive chronic kidney disease with stage 1 through stage 4 chronic kidney disease, or unspecified chronic kidney disease: Secondary | ICD-10-CM | POA: Insufficient documentation

## 2012-06-16 DIAGNOSIS — G4733 Obstructive sleep apnea (adult) (pediatric): Secondary | ICD-10-CM | POA: Insufficient documentation

## 2012-06-16 DIAGNOSIS — Z96659 Presence of unspecified artificial knee joint: Secondary | ICD-10-CM | POA: Insufficient documentation

## 2012-06-16 DIAGNOSIS — N183 Chronic kidney disease, stage 3 unspecified: Secondary | ICD-10-CM | POA: Insufficient documentation

## 2012-06-16 DIAGNOSIS — E119 Type 2 diabetes mellitus without complications: Secondary | ICD-10-CM | POA: Insufficient documentation

## 2012-06-16 DIAGNOSIS — M109 Gout, unspecified: Secondary | ICD-10-CM | POA: Insufficient documentation

## 2012-06-16 HISTORY — PX: CARPOMETACARPEL SUSPENSION PLASTY: SHX5005

## 2012-06-16 HISTORY — DX: Personal history of other diseases of the digestive system: Z87.19

## 2012-06-16 HISTORY — DX: Personal history of other diseases of the musculoskeletal system and connective tissue: Z87.39

## 2012-06-16 HISTORY — DX: Localized edema: R60.0

## 2012-06-16 HISTORY — DX: Gastro-esophageal reflux disease without esophagitis: K21.9

## 2012-06-16 HISTORY — DX: Hyperlipidemia, unspecified: E78.5

## 2012-06-16 HISTORY — DX: Personal history of other venous thrombosis and embolism: Z86.718

## 2012-06-16 HISTORY — DX: Chronic diastolic (congestive) heart failure: I50.32

## 2012-06-16 LAB — POCT I-STAT, CHEM 8
Calcium, Ion: 1.18 mmol/L (ref 1.13–1.30)
Glucose, Bld: 158 mg/dL — ABNORMAL HIGH (ref 70–99)
HCT: 44 % (ref 36.0–46.0)
Hemoglobin: 15 g/dL (ref 12.0–15.0)
Potassium: 3.4 mEq/L — ABNORMAL LOW (ref 3.5–5.1)

## 2012-06-16 LAB — GLUCOSE, CAPILLARY: Glucose-Capillary: 149 mg/dL — ABNORMAL HIGH (ref 70–99)

## 2012-06-16 SURGERY — CARPOMETACARPEL (CMC) SUSPENSION PLASTY
Anesthesia: Regional | Site: Thumb | Laterality: Right | Wound class: Clean

## 2012-06-16 MED ORDER — CEFAZOLIN SODIUM-DEXTROSE 2-3 GM-% IV SOLR
2.0000 g | INTRAVENOUS | Status: AC
  Administered 2012-06-16: 2 g via INTRAVENOUS
  Filled 2012-06-16: qty 50

## 2012-06-16 MED ORDER — LIDOCAINE HCL (CARDIAC) 20 MG/ML IV SOLN
INTRAVENOUS | Status: DC | PRN
  Administered 2012-06-16: 50 mg via INTRAVENOUS

## 2012-06-16 MED ORDER — LACTATED RINGERS IV SOLN
INTRAVENOUS | Status: DC
  Filled 2012-06-16: qty 1000

## 2012-06-16 MED ORDER — LIDOCAINE-EPINEPHRINE 1 %-1:100000 IJ SOLN
INTRAMUSCULAR | Status: DC | PRN
  Administered 2012-06-16: 30 mL

## 2012-06-16 MED ORDER — LACTATED RINGERS IV SOLN
INTRAVENOUS | Status: DC
  Administered 2012-06-16: 07:00:00 via INTRAVENOUS
  Filled 2012-06-16: qty 1000

## 2012-06-16 MED ORDER — HYDROCODONE-ACETAMINOPHEN 5-325 MG PO TABS
1.0000 | ORAL_TABLET | ORAL | Status: DC | PRN
  Filled 2012-06-16: qty 2

## 2012-06-16 MED ORDER — PROPOFOL 10 MG/ML IV EMUL
INTRAVENOUS | Status: DC | PRN
  Administered 2012-06-16: 50 ug/kg/min via INTRAVENOUS

## 2012-06-16 MED ORDER — ONDANSETRON HCL 4 MG/2ML IJ SOLN
INTRAMUSCULAR | Status: DC | PRN
  Administered 2012-06-16: 4 mg via INTRAVENOUS

## 2012-06-16 MED ORDER — MIDAZOLAM HCL 5 MG/5ML IJ SOLN
INTRAMUSCULAR | Status: DC | PRN
  Administered 2012-06-16: 0.5 mg via INTRAVENOUS

## 2012-06-16 MED ORDER — KETAMINE HCL 50 MG/ML IJ SOLN
INTRAMUSCULAR | Status: DC | PRN
  Administered 2012-06-16: 90 mg via INTRAMUSCULAR
  Administered 2012-06-16: 2.2 mg via INTRAVENOUS

## 2012-06-16 MED ORDER — FENTANYL CITRATE 0.05 MG/ML IJ SOLN
INTRAMUSCULAR | Status: DC | PRN
  Administered 2012-06-16: 25 ug via INTRAVENOUS
  Administered 2012-06-16: 50 ug via INTRAVENOUS
  Administered 2012-06-16: 25 ug via INTRAVENOUS

## 2012-06-16 MED ORDER — MIDAZOLAM HCL 2 MG/2ML IJ SOLN
1.0000 mg | Freq: Two times a day (BID) | INTRAMUSCULAR | Status: DC | PRN
  Administered 2012-06-16: 1 mg via INTRAVENOUS
  Filled 2012-06-16: qty 1

## 2012-06-16 MED ORDER — OXYCODONE-ACETAMINOPHEN 5-325 MG PO TABS
1.0000 | ORAL_TABLET | ORAL | Status: DC | PRN
  Administered 2012-06-16: 1 via ORAL
  Filled 2012-06-16: qty 2

## 2012-06-16 MED ORDER — HYDROMORPHONE HCL PF 1 MG/ML IJ SOLN
0.5000 mg | INTRAMUSCULAR | Status: DC | PRN
  Filled 2012-06-16: qty 1

## 2012-06-16 MED ORDER — PROMETHAZINE HCL 25 MG/ML IJ SOLN
6.2500 mg | INTRAMUSCULAR | Status: DC | PRN
  Filled 2012-06-16: qty 1

## 2012-06-16 MED ORDER — FENTANYL CITRATE 0.05 MG/ML IJ SOLN
25.0000 ug | INTRAMUSCULAR | Status: DC | PRN
  Filled 2012-06-16: qty 1

## 2012-06-16 MED ORDER — 0.9 % SODIUM CHLORIDE (POUR BTL) OPTIME
TOPICAL | Status: DC | PRN
  Administered 2012-06-16: 500 mL

## 2012-06-16 MED ORDER — MEPERIDINE HCL 25 MG/ML IJ SOLN
6.2500 mg | INTRAMUSCULAR | Status: DC | PRN
  Filled 2012-06-16: qty 1

## 2012-06-16 SURGICAL SUPPLY — 50 items
BANDAGE CONFORM 3  STR LF (GAUZE/BANDAGES/DRESSINGS) ×2 IMPLANT
BANDAGE GAUZE ELAST BULKY 4 IN (GAUZE/BANDAGES/DRESSINGS) ×4 IMPLANT
BLADE MINI RND TIP GREEN BEAV (BLADE) ×1 IMPLANT
BLADE OSC/SAG .038X5.5 CUT EDG (BLADE) ×2 IMPLANT
BLADE SURG 15 STRL LF DISP TIS (BLADE) ×1 IMPLANT
BLADE SURG 15 STRL SS (BLADE) ×2
BNDG CMPR 9X4 STRL LF SNTH (GAUZE/BANDAGES/DRESSINGS) ×1
BNDG COHESIVE 4X5 TAN NS LF (GAUZE/BANDAGES/DRESSINGS) ×2 IMPLANT
BNDG ESMARK 4X9 LF (GAUZE/BANDAGES/DRESSINGS) ×1 IMPLANT
CHLORAPREP W/TINT 26ML (MISCELLANEOUS) ×2 IMPLANT
CLOTH BEACON ORANGE TIMEOUT ST (SAFETY) ×2 IMPLANT
CORDS BIPOLAR (ELECTRODE) ×1 IMPLANT
COVER TABLE BACK 60X90 (DRAPES) ×2 IMPLANT
DRAPE EXTREMITY T 121X128X90 (DRAPE) ×2 IMPLANT
DRAPE SURG 17X23 STRL (DRAPES) ×2 IMPLANT
DRSG EMULSION OIL 3X3 NADH (GAUZE/BANDAGES/DRESSINGS) ×2 IMPLANT
GAUZE SPONGE 4X4 12PLY STRL LF (GAUZE/BANDAGES/DRESSINGS) ×1 IMPLANT
GLOVE BIO SURGEON STRL SZ 6.5 (GLOVE) ×1 IMPLANT
GLOVE BIO SURGEON STRL SZ7.5 (GLOVE) ×2 IMPLANT
GLOVE ECLIPSE 6.0 STRL STRAW (GLOVE) ×1 IMPLANT
GLOVE INDICATOR 8.0 STRL GRN (GLOVE) ×4 IMPLANT
GOWN PREVENTION PLUS LG XLONG (DISPOSABLE) ×2 IMPLANT
GOWN PREVENTION PLUS XLARGE (GOWN DISPOSABLE) ×2 IMPLANT
KWIRE 4.0 X .062IN (WIRE) ×1 IMPLANT
NEEDLE HYPO 22GX1.5 SAFETY (NEEDLE) ×1 IMPLANT
NS IRRIG 500ML POUR BTL (IV SOLUTION) ×1 IMPLANT
PACK BASIN DAY SURGERY FS (CUSTOM PROCEDURE TRAY) ×2 IMPLANT
PAD CAST 3X4 CTTN HI CHSV (CAST SUPPLIES) IMPLANT
PAD CAST 4YDX4 CTTN HI CHSV (CAST SUPPLIES) ×1 IMPLANT
PADDING CAST ABS 3INX4YD NS (CAST SUPPLIES) ×1
PADDING CAST ABS 4INX4YD NS (CAST SUPPLIES) ×1
PADDING CAST ABS COTTON 3X4 (CAST SUPPLIES) IMPLANT
PADDING CAST ABS COTTON 4X4 ST (CAST SUPPLIES) ×1 IMPLANT
PADDING CAST COTTON 3X4 STRL (CAST SUPPLIES) ×2
PADDING CAST COTTON 4X4 STRL (CAST SUPPLIES) ×2
SLEEVE SCD COMPRESS KNEE MED (MISCELLANEOUS) ×1 IMPLANT
SLING ARM FOAM STRAP LRG (SOFTGOODS) IMPLANT
SLING ARM FOAM STRAP XLG (SOFTGOODS) ×1 IMPLANT
SPLINT PLASTER CAST XFAST 4X15 (CAST SUPPLIES) IMPLANT
SPLINT PLASTER XTRA FAST SET 4 (CAST SUPPLIES) ×1
SPONGE GAUZE 4X4 12PLY (GAUZE/BANDAGES/DRESSINGS) ×2 IMPLANT
SUT ETHIBOND 3-0 V-5 (SUTURE) ×2 IMPLANT
SUT MERSILENE 4 0 P 3 (SUTURE) ×1 IMPLANT
SUT PDS AB 2-0 CT2 27 (SUTURE) ×1 IMPLANT
SUT STEEL 4 (SUTURE) ×3 IMPLANT
SUT VIC AB 2-0 PS2 27 (SUTURE) ×1 IMPLANT
SUT VICRYL RAPID 5 0 P 3 (SUTURE) IMPLANT
SUT VICRYL RAPIDE 4/0 PS 2 (SUTURE) ×2 IMPLANT
SYRINGE 10CC LL (SYRINGE) ×2 IMPLANT
UNDERPAD 30X30 INCONTINENT (UNDERPADS AND DIAPERS) ×2 IMPLANT

## 2012-06-16 NOTE — Op Note (Signed)
06/16/2012  7:26 AM  PATIENT:  [name]  78 y.o. female  PRE-OPERATIVE DIAGNOSIS:  Right TMC OA  POST-OPERATIVE DIAGNOSIS:  Same  PROCEDURE:  Right thumb suspension arthroplasty (LRTI)  SURGEON: Cliffton Asters. Janee Morn, MD  PHYSICIAN ASSISTANT: None  ANESTHESIA:  regional--IS block  SPECIMENS:  Trapezium excised piecemeal and discarded  DRAINS:   None  PREOPERATIVE INDICATIONS:  [name] is a  78 y.o. female with a diagnosis of right TMC OA who failed conservative measures and elected for surgical management.    The risks benefits and alternatives were discussed with the patient preoperatively including but not limited to the risks of infection, bleeding, nerve injury, cardiopulmonary complications, the need for revision surgery, among others, and the patient verbalized understanding and consented to proceed.  OPERATIVE IMPLANTS: None  OPERATIVE FINDINGS: Very advanced degenerative change at the [facility] joint, also with fairly significant DJD at the scaphoid trapezoid joint  OPERATIVE PROCEDURE: After receiving prophylactic antibiotics, the patient was escorted to the operative theatre and placed in a supine position. An interscalene block had already been administered. A surgical "time-out" was performed during which the planned procedure, proposed operative site, and the correct patient identity were compared to the operative consent and agreement confirmed by the circulating nurse according to current facility policy. Following application of a tourniquet to the operative extremity, the exposed skin was prepped with Chloraprep and draped in the usual sterile fashion. The limb was exsanguinated with an Esmarch bandage and the tourniquet inflated to approximately higher than systolic BP.  A Wagner incision was marked at the thumb as was an oblique incision over the FCR just distal to the muscle tendon junction. TMC joint was then made sharply with a scalpel. Subcutaneous  tissues were dissected with blunt, spreading dissection with care to identify and protect and preserve the cutaneous nerve branches that were retracted and the dorsal flap. The thenar muscles were reflected extra periosteally and then the longitudinal deep incision was made at the palmar edge of the abductor tendon, allowing some of the volar portion to go with the volar capsular reflection and the remainder with a dorsal capsular reflection. The [facility] joint was inspected and found to be badly arthritic. The trapezium was excised piecemeal. The scaphotrapezoid joint was inspected and found to have a moderate degree of degenerative change, therefore the proximal [name] of the trapezoid was resected as well. The base of the metacarpal was resected with an oscillating saw and then the hole was made in the trapezium to accept the suspension. It was made with a 0.062 inch K wire first, followed by a 2.7 then 3.5 mm drill bit. A longitudinal [name] strip of the FCR was then harvested making an accessory incision more proximally. It was brought into the wound where it was then used to create a suspension for the metacarpal of the thumb, first bringing it through the tunnel that had been prepared, then passing it back down through the capsule between APL and EPB tendons. It was tied and a [name] hitch upon itself and this was secured with 3-0 Ethibond suture. Everything was again copiously irrigated and the remainder of the tendon fanfolded and secured with additional 3-0 suture that had been placed into the capsule and the depth of the wound. This allowed for retention of the interposition material within the resection space. The tourniquet was released and the distal hemostasis obtained. Wounds were again copiously irrigated and then the capsule at the [facility] joint was closed  with 3-0 Ethibond suture. A 4-0 Vicryl Rapide suture was used to repair the reflected muscle of the thenar cone and then all the skin was closed with 4-0  Vicryl Rapide sutures. The proximal incision were with running interrupted subcuticular suture, the most distal incision was with interrupted horizontal mattress and simple sutures. A bulky hand dressing with a thumb spica plaster component was applied and the patient was taken to the recovery room in stable condition, breathing spontaneously  DISPOSITION: Will be discharged home today with typical postop instructions, returning in 10-15 days for reassessment. At that time her splint dressing should be removed and she will transition directly to hand therapy for fabrication of a custom forearm-based thumb spica splint and initiation of rehabilitation.

## 2012-06-16 NOTE — Interval H&P Note (Signed)
History and Physical Interval Note:  06/16/2012 7:20 AM  [name]  has presented today for surgery, with the diagnosis of RIGHT THUMB TMC OA  The various methods of treatment have been discussed with the patient and family. After consideration of risks, benefits and other options for treatment, the patient has consented to  Procedure(s) (LRB) with comments: CARPOMETACARPEL (CMC) SUSPENSION PLASTY (Right) - RIGHT THUMB SUSPENSION ARTHROPLASTY. as a surgical intervention .  The patient's history has been reviewed, patient examined, no change in status, stable for surgery.  I have reviewed the patient's chart and labs.  Questions were answered to the patient's satisfaction.     [name] A.

## 2012-06-16 NOTE — Anesthesia Preprocedure Evaluation (Addendum)
Anesthesia Evaluation  Patient identified by MRN, date of birth, ID band Patient awake    Reviewed: Allergy & Precautions, H&P , NPO status , Patient's Chart, lab work & pertinent test results  Airway Mallampati: II TM Distance: >3 FB Neck ROM: Full    Dental No notable dental hx.    Pulmonary sleep apnea and Continuous Positive Airway Pressure Ventilation ,  breath sounds clear to auscultation  Pulmonary exam normal       Cardiovascular hypertension, Pt. on medications + Peripheral Vascular Disease and +CHF III+ Valvular Problems/Murmurs Rhythm:Regular Rate:Normal     Neuro/Psych negative neurological ROS  negative psych ROS   GI/Hepatic Neg liver ROS, hiatal hernia, GERD-  ,  Endo/Other  diabetes, Insulin DependentMorbid obesity  Renal/GU CRFRenal disease  negative genitourinary   Musculoskeletal negative musculoskeletal ROS (+)   Abdominal   Peds negative pediatric ROS (+)  Hematology negative hematology ROS (+)   Anesthesia Other Findings   Reproductive/Obstetrics negative OB ROS                           Anesthesia Physical Anesthesia Plan  ASA: III  Anesthesia Plan: Regional   Post-op Pain Management:    Induction: Intravenous  Airway Management Planned: Simple Face Mask  Additional Equipment:   Intra-op Plan:   Post-operative Plan: Extubation in OR  Informed Consent: I have reviewed the patients History and Physical, chart, labs and discussed the procedure including the risks, benefits and alternatives for the proposed anesthesia with the patient or authorized representative who has indicated his/her understanding and acceptance.   Dental advisory given  Plan Discussed with: CRNA  Anesthesia Plan Comments: (SCB and sedation)       Anesthesia Quick Evaluation

## 2012-06-16 NOTE — Transfer of Care (Signed)
Immediate Anesthesia Transfer of Care Note  Patient: [name]  Procedure(s) Performed: Procedure(s) (LRB): CARPOMETACARPEL (CMC) SUSPENSION PLASTY (Right)  Patient Location: Patient transported to PACU with oxygen via face mask at 6 Liters / Min  Anesthesia Type: MAC  Level of Consciousness: awake and alert   Airway & Oxygen Therapy: Patient Spontanous Breathing and Patient connected to face mask oxygen  Post-op Assessment: Report given to PACU RN and Post -op Vital signs reviewed and stable  Post vital signs: Reviewed and stable  Complications: No apparent anesthesia complications

## 2012-06-16 NOTE — Anesthesia Procedure Notes (Addendum)
Anesthesia Regional Block:  Supraclavicular block  Pre-Anesthetic Checklist: ,, timeout performed, Correct Patient, Correct Site, Correct Laterality, Correct Procedure, Correct Position, site marked, Risks and benefits discussed, Surgical consent,  Pre-op evaluation,  At surgeon's request  Laterality: Right  Prep: chloraprep       Needles:  Injection technique: Single-shot  Needle Type: Stimiplex     Needle Length: 10cm 10 cm     Additional Needles:  Procedures: ultrasound guided (picture in chart) and nerve stimulator Supraclavicular block Narrative:  Injection made incrementally with aspirations every 5 mL.  Performed by: Personally  Anesthesiologist: Phillips Grout MD  Additional Notes: Risks, benefits and alternative to block explained extensively.  Patient tolerated procedure well, without complications.  Supraclavicular block Procedure Name: MAC Date/Time: 06/16/2012 7:38 AM Performed by: Fran Lowes Pre-anesthesia Checklist: Patient identified, Timeout performed, Emergency Drugs available, Suction available and Patient being monitored Patient Re-evaluated:Patient Re-evaluated prior to inductionOxygen Delivery Method: Simple face mask Intubation Type: IV induction

## 2012-06-16 NOTE — Anesthesia Postprocedure Evaluation (Signed)
  Anesthesia Post-op Note  Patient: [name]  Procedure(s) Performed: Procedure(s) (LRB): CARPOMETACARPEL (CMC) SUSPENSION PLASTY (Right)  Patient Location: PACU  Anesthesia Type: Regional  Level of Consciousness: awake and alert   Airway and Oxygen Therapy: Patient Spontanous Breathing  Post-op Pain: mild  Post-op Assessment: Post-op Vital signs reviewed, Patient's Cardiovascular Status Stable, Respiratory Function Stable, Patent Airway and No signs of Nausea or vomiting  Last Vitals:  Filed Vitals:   06/16/12 0915  BP: 91/45  Pulse: 75  Temp:   Resp: 22    Post-op Vital Signs: stable   Complications: No apparent anesthesia complications

## 2012-06-17 NOTE — Progress Notes (Signed)
Patient that hand feels funny.  It is not swollen or have and change in color or temperature.  Discussed that she did have a block but suggested that she touch base with doctors office to inform him

## 2012-06-18 ENCOUNTER — Encounter (HOSPITAL_BASED_OUTPATIENT_CLINIC_OR_DEPARTMENT_OTHER): Payer: Self-pay | Admitting: Orthopedic Surgery

## 2012-07-15 ENCOUNTER — Encounter (HOSPITAL_BASED_OUTPATIENT_CLINIC_OR_DEPARTMENT_OTHER): Payer: Medicare Other | Attending: General Surgery

## 2012-07-15 DIAGNOSIS — I87319 Chronic venous hypertension (idiopathic) with ulcer of unspecified lower extremity: Secondary | ICD-10-CM | POA: Insufficient documentation

## 2012-07-15 DIAGNOSIS — L97909 Non-pressure chronic ulcer of unspecified part of unspecified lower leg with unspecified severity: Secondary | ICD-10-CM | POA: Insufficient documentation

## 2012-07-15 DIAGNOSIS — L97809 Non-pressure chronic ulcer of other part of unspecified lower leg with unspecified severity: Secondary | ICD-10-CM | POA: Insufficient documentation

## 2012-07-15 NOTE — H&P (Signed)
[name], [name]                 ACCOUNT NO.:  [id number]  MEDICAL RECORD NO.:  [id number]  LOCATION:  FOOT                         FACILITY:  [facility]  PHYSICIAN:  [name], M.D.        DATE OF BIRTH:  07/23/1933  DATE OF ADMISSION:  07/15/2012 DATE OF DISCHARGE:                             HISTORY & PHYSICAL   CHIEF COMPLAINT:  Wound, right leg.  HISTORY OF PRESENT ILLNESS:  This is a 78 year old female with long history of chronic venous hypertension and many episodes of ulceration. Approximately 2 weeks ago, she noticed a blister on the lateral aspect of her right leg.  She was wearing support stockings at the time.  The blister has been somewhat uncomfortable, has not been treated.  PAST MEDICAL HISTORY:  Significant for: 1. Type 2 diabetes. 2. DVT on the right leg. 3. Hypertension. 4. Gout. 5. Obesity. 6. GERD. 7. Recent diagnosis of congestive heart failure and some chronic renal     failure.  PAST SURGICAL HISTORY:  Includes cataract surgery, right hand surgery, and right knee surgery.  SOCIAL HISTORY:  Cigarettes none.  Alcohol none.  MEDICATIONS:  Hydrocodone, metoprolol, Zaroxolyn, clonidine, Lasix, glimepiride, nabumetone, omeprazole, and insulin.  ALLERGIES:  None.  REVIEW OF SYSTEMS:  As above.  PHYSICAL EXAMINATION:  VITAL SIGNS:  Temperature 97.5, pulse 76, respirations 18, blood pressure 125/75.  Glucose is 180. GENERAL:  Well developed, well nourished, no distress.  Seems oriented. HEAD, EYES, EARS, NOSE, THROAT:  Normal to cursory examination. CHEST AND HEART:  Essentially normal, although sounds are distant. EXTREMITIES:  Examination of extremities reveals good peripheral pulses on the right side.  ABI is 0.9.  There is a 2.3 x 2.5 very superficial ulceration on the lateral aspect of the right leg.  Blister is easily removed with no bleeding.  IMPRESSION:  Chronic venous hypertension with recurrent ulcer.  PLAN OF TREATMENT:  Start with silver  alginate and Unna boot.  We will see her in 7 days.     [name], M.D.     RA/MEDQ  D:  07/15/2012  T:  07/15/2012  Job:  [id number]

## 2012-07-16 DIAGNOSIS — I639 Cerebral infarction, unspecified: Secondary | ICD-10-CM | POA: Insufficient documentation

## 2012-07-16 HISTORY — DX: Cerebral infarction, unspecified: I63.9

## 2012-07-23 ENCOUNTER — Encounter (HOSPITAL_BASED_OUTPATIENT_CLINIC_OR_DEPARTMENT_OTHER): Payer: Medicare Other | Attending: General Surgery

## 2012-07-23 DIAGNOSIS — L97909 Non-pressure chronic ulcer of unspecified part of unspecified lower leg with unspecified severity: Secondary | ICD-10-CM | POA: Insufficient documentation

## 2012-07-23 DIAGNOSIS — I87319 Chronic venous hypertension (idiopathic) with ulcer of unspecified lower extremity: Secondary | ICD-10-CM | POA: Insufficient documentation

## 2012-08-07 ENCOUNTER — Inpatient Hospital Stay (HOSPITAL_COMMUNITY): Payer: Medicare Other

## 2012-08-07 ENCOUNTER — Emergency Department (HOSPITAL_COMMUNITY): Payer: Medicare Other

## 2012-08-07 ENCOUNTER — Encounter (HOSPITAL_COMMUNITY): Payer: Self-pay | Admitting: Radiology

## 2012-08-07 ENCOUNTER — Inpatient Hospital Stay (HOSPITAL_COMMUNITY)
Admission: EM | Admit: 2012-08-07 | Discharge: 2012-08-09 | DRG: 064 | Disposition: A | Discharge status: Home / Self Care | Payer: Medicare Other | Attending: Internal Medicine | Admitting: Internal Medicine

## 2012-08-07 ENCOUNTER — Other Ambulatory Visit: Payer: Self-pay

## 2012-08-07 DIAGNOSIS — I509 Heart failure, unspecified: Secondary | ICD-10-CM | POA: Diagnosis present

## 2012-08-07 DIAGNOSIS — I359 Nonrheumatic aortic valve disorder, unspecified: Secondary | ICD-10-CM | POA: Diagnosis present

## 2012-08-07 DIAGNOSIS — G4733 Obstructive sleep apnea (adult) (pediatric): Secondary | ICD-10-CM | POA: Diagnosis present

## 2012-08-07 DIAGNOSIS — Z96659 Presence of unspecified artificial knee joint: Secondary | ICD-10-CM

## 2012-08-07 DIAGNOSIS — I2789 Other specified pulmonary heart diseases: Secondary | ICD-10-CM | POA: Diagnosis present

## 2012-08-07 DIAGNOSIS — N183 Chronic kidney disease, stage 3 unspecified: Secondary | ICD-10-CM | POA: Diagnosis present

## 2012-08-07 DIAGNOSIS — G459 Transient cerebral ischemic attack, unspecified: Secondary | ICD-10-CM

## 2012-08-07 DIAGNOSIS — E119 Type 2 diabetes mellitus without complications: Secondary | ICD-10-CM | POA: Diagnosis present

## 2012-08-07 DIAGNOSIS — I6350 Cerebral infarction due to unspecified occlusion or stenosis of unspecified cerebral artery: Principal | ICD-10-CM | POA: Diagnosis present

## 2012-08-07 DIAGNOSIS — I5033 Acute on chronic diastolic (congestive) heart failure: Secondary | ICD-10-CM | POA: Diagnosis present

## 2012-08-07 DIAGNOSIS — E1169 Type 2 diabetes mellitus with other specified complication: Secondary | ICD-10-CM | POA: Diagnosis present

## 2012-08-07 DIAGNOSIS — K219 Gastro-esophageal reflux disease without esophagitis: Secondary | ICD-10-CM | POA: Diagnosis present

## 2012-08-07 DIAGNOSIS — M19049 Primary osteoarthritis, unspecified hand: Secondary | ICD-10-CM | POA: Diagnosis present

## 2012-08-07 DIAGNOSIS — I421 Obstructive hypertrophic cardiomyopathy: Secondary | ICD-10-CM | POA: Diagnosis present

## 2012-08-07 DIAGNOSIS — E669 Obesity, unspecified: Secondary | ICD-10-CM | POA: Diagnosis present

## 2012-08-07 DIAGNOSIS — I739 Peripheral vascular disease, unspecified: Secondary | ICD-10-CM | POA: Diagnosis present

## 2012-08-07 DIAGNOSIS — I272 Pulmonary hypertension, unspecified: Secondary | ICD-10-CM

## 2012-08-07 DIAGNOSIS — G4730 Sleep apnea, unspecified: Secondary | ICD-10-CM | POA: Diagnosis present

## 2012-08-07 DIAGNOSIS — E785 Hyperlipidemia, unspecified: Secondary | ICD-10-CM | POA: Diagnosis present

## 2012-08-07 DIAGNOSIS — I10 Essential (primary) hypertension: Secondary | ICD-10-CM

## 2012-08-07 DIAGNOSIS — Z79899 Other long term (current) drug therapy: Secondary | ICD-10-CM

## 2012-08-07 DIAGNOSIS — I129 Hypertensive chronic kidney disease with stage 1 through stage 4 chronic kidney disease, or unspecified chronic kidney disease: Secondary | ICD-10-CM | POA: Diagnosis present

## 2012-08-07 DIAGNOSIS — Z7982 Long term (current) use of aspirin: Secondary | ICD-10-CM

## 2012-08-07 DIAGNOSIS — I5189 Other ill-defined heart diseases: Secondary | ICD-10-CM | POA: Diagnosis present

## 2012-08-07 DIAGNOSIS — K449 Diaphragmatic hernia without obstruction or gangrene: Secondary | ICD-10-CM | POA: Diagnosis present

## 2012-08-07 DIAGNOSIS — I351 Nonrheumatic aortic (valve) insufficiency: Secondary | ICD-10-CM

## 2012-08-07 DIAGNOSIS — M109 Gout, unspecified: Secondary | ICD-10-CM | POA: Diagnosis present

## 2012-08-07 DIAGNOSIS — E876 Hypokalemia: Secondary | ICD-10-CM | POA: Diagnosis present

## 2012-08-07 DIAGNOSIS — R011 Cardiac murmur, unspecified: Secondary | ICD-10-CM | POA: Diagnosis present

## 2012-08-07 DIAGNOSIS — I639 Cerebral infarction, unspecified: Secondary | ICD-10-CM

## 2012-08-07 DIAGNOSIS — I872 Venous insufficiency (chronic) (peripheral): Secondary | ICD-10-CM | POA: Diagnosis present

## 2012-08-07 DIAGNOSIS — Z794 Long term (current) use of insulin: Secondary | ICD-10-CM

## 2012-08-07 HISTORY — DX: Transient cerebral ischemic attack, unspecified: G45.9

## 2012-08-07 LAB — COMPREHENSIVE METABOLIC PANEL
Alkaline Phosphatase: 86 U/L (ref 39–117)
BUN: 54 mg/dL — ABNORMAL HIGH (ref 6–23)
CO2: 34 mEq/L — ABNORMAL HIGH (ref 19–32)
Chloride: 92 mEq/L — ABNORMAL LOW (ref 96–112)
Creatinine, Ser: 1.82 mg/dL — ABNORMAL HIGH (ref 0.50–1.10)
GFR calc non Af Amer: 26 mL/min — ABNORMAL LOW (ref >90)
Total Bilirubin: 0.7 mg/dL (ref 0.3–1.2)

## 2012-08-07 LAB — COMPREHENSIVE METABOLIC PANEL WITH GFR
ALT: 12 U/L (ref 0–35)
AST: 24 U/L (ref 0–37)
Albumin: 3.1 g/dL — ABNORMAL LOW (ref 3.5–5.2)
Calcium: 9.7 mg/dL (ref 8.4–10.5)
GFR calc Af Amer: 30 mL/min — ABNORMAL LOW (ref >90)
Glucose, Bld: 211 mg/dL — ABNORMAL HIGH (ref 70–99)
Potassium: 2.9 meq/L — ABNORMAL LOW (ref 3.5–5.1)
Sodium: 139 meq/L (ref 135–145)
Total Protein: 7.3 g/dL (ref 6.0–8.3)

## 2012-08-07 LAB — DIFFERENTIAL
Basophils Absolute: 0 K/uL (ref 0.0–0.1)
Basophils Relative: 0 % (ref 0–1)
Eosinophils Absolute: 0.4 K/uL (ref 0.0–0.7)
Eosinophils Relative: 3 % (ref 0–5)
Lymphocytes Relative: 16 % (ref 12–46)
Lymphs Abs: 1.9 10*3/uL (ref 0.7–4.0)
Monocytes Absolute: 0.8 10*3/uL (ref 0.1–1.0)
Monocytes Relative: 7 % (ref 3–12)
Neutro Abs: 8.6 10*3/uL — ABNORMAL HIGH (ref 1.7–7.7)
Neutrophils Relative %: 74 % (ref 43–77)

## 2012-08-07 LAB — RAPID URINE DRUG SCREEN, HOSP PERFORMED
Amphetamines: NOT DETECTED
Barbiturates: NOT DETECTED
Benzodiazepines: NOT DETECTED
Cocaine: NOT DETECTED
Opiates: NOT DETECTED
Tetrahydrocannabinol: NOT DETECTED

## 2012-08-07 LAB — TROPONIN I: Troponin I: <0.3 ng/mL (ref <0.30)

## 2012-08-07 LAB — CBC
HCT: 40.1 % (ref 36.0–46.0)
Hemoglobin: 13.4 g/dL (ref 12.0–15.0)
MCH: 30.5 pg (ref 26.0–34.0)
MCHC: 33.4 g/dL (ref 30.0–36.0)
MCV: 91.1 fL (ref 78.0–100.0)
Platelets: 214 K/uL (ref 150–400)
RBC: 4.4 MIL/uL (ref 3.87–5.11)
RDW: 15.7 % — ABNORMAL HIGH (ref 11.5–15.5)
WBC: 11.7 10*3/uL — ABNORMAL HIGH (ref 4.0–10.5)

## 2012-08-07 LAB — GLUCOSE, CAPILLARY
Glucose-Capillary: 209 mg/dL — ABNORMAL HIGH (ref 70–99)
Glucose-Capillary: 121 mg/dL — ABNORMAL HIGH (ref 70–99)
Glucose-Capillary: 137 mg/dL — ABNORMAL HIGH (ref 70–99)

## 2012-08-07 LAB — PROTIME-INR
INR: 1.2 (ref 0.00–1.49)
Prothrombin Time: 15 s (ref 11.6–15.2)

## 2012-08-07 LAB — URINALYSIS, ROUTINE W REFLEX MICROSCOPIC
Bilirubin Urine: NEGATIVE
Glucose, UA: NEGATIVE mg/dL
Hgb urine dipstick: NEGATIVE
Ketones, ur: NEGATIVE mg/dL
Leukocytes, UA: NEGATIVE
Nitrite: NEGATIVE
Protein, ur: NEGATIVE mg/dL
Specific Gravity, Urine: 1.011 (ref 1.005–1.030)
Urobilinogen, UA: 0.2 mg/dL (ref 0.0–1.0)
pH: 7.5 (ref 5.0–8.0)

## 2012-08-07 LAB — APTT: aPTT: 30 seconds (ref 24–37)

## 2012-08-07 LAB — POCT I-STAT, CHEM 8
BUN: 51 mg/dL — ABNORMAL HIGH (ref 6–23)
Calcium, Ion: 1.11 mmol/L — ABNORMAL LOW (ref 1.13–1.30)
Glucose, Bld: 197 mg/dL — ABNORMAL HIGH (ref 70–99)
TCO2: 37 mmol/L (ref 0–100)

## 2012-08-07 LAB — POCT I-STAT TROPONIN I: Troponin i, poc: 0.2 ng/mL (ref 0.00–0.08)

## 2012-08-07 LAB — ETHANOL: Alcohol, Ethyl (B): <11 mg/dL (ref 0–11)

## 2012-08-07 MED ORDER — PANTOPRAZOLE SODIUM 40 MG PO TBEC: 40.0000 mg | DELAYED_RELEASE_TABLET | Freq: Every day | ORAL | Status: DC

## 2012-08-07 MED ORDER — SENNOSIDES-DOCUSATE SODIUM 8.6-50 MG PO TABS
1.0000 | ORAL_TABLET | Freq: Every evening | ORAL | Status: DC | PRN
  Filled 2012-08-07: qty 1

## 2012-08-07 MED ORDER — FUROSEMIDE 40 MG PO TABS
40.0000 mg | ORAL_TABLET | Freq: Two times a day (BID) | ORAL | Status: DC
  Administered 2012-08-08 – 2012-08-09 (×3): 40 mg via ORAL
  Filled 2012-08-07 (×5): qty 1

## 2012-08-07 MED ORDER — ENOXAPARIN SODIUM 40 MG/0.4ML ~~LOC~~ SOLN
40.0000 mg | SUBCUTANEOUS | Status: DC
  Administered 2012-08-07 – 2012-08-08 (×2): 40 mg via SUBCUTANEOUS
  Filled 2012-08-07 (×3): qty 0.4

## 2012-08-07 MED ORDER — GLIMEPIRIDE 4 MG PO TABS
4.0000 mg | ORAL_TABLET | Freq: Every day | ORAL | Status: DC
  Administered 2012-08-08 – 2012-08-09 (×2): 4 mg via ORAL
  Filled 2012-08-07 (×4): qty 1

## 2012-08-07 MED ORDER — METOPROLOL TARTRATE 1 MG/ML IV SOLN
5.0000 mg | Freq: Four times a day (QID) | INTRAVENOUS | Status: DC | PRN
  Administered 2012-08-07: 5 mg via INTRAVENOUS
  Filled 2012-08-07: qty 5

## 2012-08-07 MED ORDER — ASPIRIN 300 MG RE SUPP
150.0000 mg | Freq: Every day | RECTAL | Status: DC
  Administered 2012-08-08: 150 mg via RECTAL
  Filled 2012-08-07: qty 1

## 2012-08-07 MED ORDER — POTASSIUM CHLORIDE 10 MEQ/100ML IV SOLN
10.0000 meq | INTRAVENOUS | Status: AC
  Administered 2012-08-07 – 2012-08-08 (×4): 10 meq via INTRAVENOUS
  Filled 2012-08-07 (×4): qty 100

## 2012-08-07 MED ORDER — POTASSIUM CHLORIDE CRYS ER 20 MEQ PO TBCR
20.0000 meq | EXTENDED_RELEASE_TABLET | Freq: Two times a day (BID) | ORAL | Status: AC
  Filled 2012-08-07: qty 1

## 2012-08-07 MED ORDER — CLONIDINE HCL 0.2 MG PO TABS
0.2000 mg | ORAL_TABLET | Freq: Two times a day (BID) | ORAL | Status: DC
  Filled 2012-08-07: qty 1

## 2012-08-07 MED ORDER — PANTOPRAZOLE SODIUM 40 MG IV SOLR
40.0000 mg | Freq: Every day | INTRAVENOUS | Status: DC
  Administered 2012-08-07: 40 mg via INTRAVENOUS
  Filled 2012-08-07 (×2): qty 40

## 2012-08-07 MED ORDER — INSULIN ASPART PROT & ASPART (70-30 MIX) 100 UNIT/ML ~~LOC~~ SUSP
10.0000 [IU] | Freq: Two times a day (BID) | SUBCUTANEOUS | Status: DC
  Administered 2012-08-08 – 2012-08-09 (×2): 10 [IU] via SUBCUTANEOUS
  Filled 2012-08-07 (×2): qty 10

## 2012-08-07 MED ORDER — METOLAZONE 2.5 MG PO TABS
2.5000 mg | ORAL_TABLET | ORAL | Status: DC
  Administered 2012-08-09: 2.5 mg via ORAL
  Filled 2012-08-07 (×2): qty 1

## 2012-08-07 MED ORDER — POTASSIUM CHLORIDE IN NACL 20-0.9 MEQ/L-% IV SOLN
Freq: Once | INTRAVENOUS | Status: AC
  Administered 2012-08-07: 19:00:00 via INTRAVENOUS
  Filled 2012-08-07: qty 1000

## 2012-08-07 MED ORDER — SODIUM CHLORIDE 0.9 % IV BOLUS (SEPSIS)
500.0000 mL | Freq: Once | INTRAVENOUS | Status: AC
  Administered 2012-08-07: 500 mL via INTRAVENOUS

## 2012-08-07 MED ORDER — METOPROLOL SUCCINATE ER 25 MG PO TB24
37.5000 mg | ORAL_TABLET | Freq: Two times a day (BID) | ORAL | Status: DC
  Administered 2012-08-08 – 2012-08-09 (×3): 37.5 mg via ORAL
  Filled 2012-08-07 (×5): qty 1

## 2012-08-07 MED ORDER — ASPIRIN 81 MG PO TABS
81.0000 mg | ORAL_TABLET | Freq: Every day | ORAL | Status: DC
  Filled 2012-08-07: qty 1

## 2012-08-07 MED ORDER — POTASSIUM CHLORIDE CRYS ER 10 MEQ PO TBCR
10.0000 meq | EXTENDED_RELEASE_TABLET | Freq: Two times a day (BID) | ORAL | Status: DC
  Administered 2012-08-08 (×2): 10 meq via ORAL
  Filled 2012-08-07 (×5): qty 1

## 2012-08-07 NOTE — ED Notes (Signed)
Patient transported to X-ray 

## 2012-08-07 NOTE — H&P (Signed)
[facility] History and Physical  [name] ZOX:[id number] DOB: 1934-06-14 DOA: 08/07/2012  Referring physician: EDP PCP: Alva Garnet., MD   Chief Complaint: Possible stroke HPI:  79 yo female who was at home in her usual state of health this AM. A friend came by to take her to a MD appt. They were having breakfast and the friend noted a sudden L facial droop at 0830. She also noted OJ dripping from the L side of her mouth when she attempted to take her pills. She proceeded to take her to her MD appt, and there she had diff getting on the scales. She normally uses a cane for safety. Once seen by the MD, they noted the facial droop and drooling, and called EMS. [facility] activated code stroke at 1136,   CT scan showed no acute abnormality. NIHSS was 5 (see doc flowsheets); she first said it was February, but quickly corrected that. She is still drooling and noted to have mild L facial droop, she has sensory loss on the L,, mild dysarthria, and 1 for inattention (L). Her BP is a little low (she had her HTN meds this AM); she received 100 cc of normal saline.. She tolerated sitting on the side of the bed, so we stood her and she was able to walk w/o assist a few steps away from the bed and back. Decision made not to treat with thrombolytics since sx are mild and 4 hrs since LKW. Denied any chest pain, shortness of breath, denies any palpitations no history of arrhythmias She has a history of DM, HTN, Chronic LEE, pulmonary HTN, OSA-on CPAP, DM and mild aortic insufficiency. Her last echo was in 2012 and revealed an EF > 55%, intracavitary gradient with stage two diastolic dysfunction and elevated left atrial pressure. RVSP of . She had a Low Risk, non-ischemic Myoview ST in 11/2010.       Review of Systems: negative for the following  Constitutional: Denies fever, chills, diaphoresis, appetite change and fatigue.  HEENT: Denies photophobia, eye pain, redness, hearing loss, ear pain,  congestion, sore throat, rhinorrhea, sneezing, mouth sores, trouble swallowing, neck pain, neck stiffness and tinnitus.  Respiratory: Denies SOB, DOE, cough, chest tightness, and wheezing.  Cardiovascular: Denies chest pain, palpitations and leg swelling.  Gastrointestinal: Denies nausea, vomiting, abdominal pain, diarrhea, constipation, blood in stool and abdominal distention.  Genitourinary: Denies dysuria, urgency, frequency, hematuria, flank pain and difficulty urinating.  Musculoskeletal: Denies myalgias, back pain, joint swelling, arthralgias and gait problem.  Skin: Denies pallor, rash and wound.  Neurological: Denies dizziness, seizures, syncope, weakness, light-headedness, numbness and headaches.  Hematological: Denies adenopathy. Easy bruising, personal or family bleeding history  Psychiatric/Behavioral: Denies suicidal ideation, mood changes, confusion, nervousness, sleep disturbance and agitation       Past Medical History  Diagnosis Date  . Hypertension   . Venous stasis of lower extremity WEARS TEDS    BLE--  CURRENTLY NO ULCERS/ WOUNDS  . Aortic insufficiency     mild/H&P 04/02/2012  . Type II diabetes mellitus   . OSA on CPAP   . Pulmonary hypertension per last echo 03-28-2012  mod.-severe     per dr wert note this related to L ht pressures from diastolic chf  . Peripheral vascular disease   . Hypertrophic obstructive cardiomyopathy   . Arthritis   . CKD (chronic kidney disease) stage 3, GFR 30-59 ml/min   . Heart murmur   . History of DVT of lower extremity lower right leg nov  2012  . Hyperlipidemia   . History of gout 06-09-2012  per pt stable  . Chronic diastolic CHF (congestive heart failure), NYHA class 3 CARDIOLOGIST- DR HILTY--  LOV OCT 2013--  REQUESTED NOTE, EGK AND STRESS TEST    PER PT ON 06-09-2012  S&S OF CHF AND WT IS DOWN  . H/O hiatal hernia   . GERD (gastroesophageal reflux disease)   . Pain of right thumb TMC  -- OSTEOARTHRITIS  . Lower  extremity edema      Past Surgical History  Procedure Date  . Total knee arthroplasty 1988; 1998 X2    right; bilateral  . Tumor excision 1970's    "off my stomach"  . Cataract extraction w/ intraocular lens implant ~ 2010    right; "put lens in; didn't work; had mass on my eye"  . Transthoracic echocardiogram 04-03-2012   DR KENNETH HILTY    LVSF  EF 80-85%/ HYPERTRONIC CARDIOMYOPATHY WITH MOSTLY MID-CAVITARY OBSTRUCTION AND A SMALLER COMPONENT OF LV OUTFLOW TRACT OBSTRUCTION/   EVIDENCE OF SEVERE DECOMPRESATION OF DIASTOLIC HEART FAILURE AND MODERATE TO SEVERE PULMONARY ARTERIAL HYPERTENSION  . Joint replacement     TOTAL RIGHT KNEE X2  1988  &  1998 (DUE TO JOINT MALFUNCTION)   TOTAL LEFT KNEE 1998  . Carpometacarpel suspension plasty 06/16/2012    Procedure: CARPOMETACARPEL [employer]) SUSPENSION PLASTY;  Surgeon: Jodi Marble, MD;  Location: [facility];  Service: Orthopedics;  Laterality: Right;  RIGHT THUMB SUSPENSION ARTHROPLASTY.      Social History:  reports that she has never smoked. She has never used smokeless tobacco. She reports that she does not drink alcohol or use illicit drugs.     No Known Allergies   Family history negative for diabetes, hypertension   Prior to Admission medications   Medication Sig Start Date End Date Taking? Authorizing Provider  allopurinol (ZYLOPRIM) 300 MG tablet Take 150 mg by mouth daily.   Yes Historical Provider, MD  aspirin 81 MG tablet Take 81 mg by mouth daily.    Yes Historical Provider, MD  cloNIDine (CATAPRES) 0.2 MG tablet Take 0.2 mg by mouth 2 (two) times daily.    Yes Historical Provider, MD  furosemide (LASIX) 40 MG tablet Take 40 mg by mouth 2 (two) times daily.    Yes Historical Provider, MD  glimepiride (AMARYL) 4 MG tablet Take 4 mg by mouth daily before breakfast.    Yes Historical Provider, MD  insulin lispro protamine-insulin lispro (HUMALOG 75/25) (75-25) 100 UNIT/ML SUSP Inject 6-10 Units into the  skin 2 (two) times daily with a meal. And 10 units am and 6 units pm 04/09/12  Yes Nada Boozer, NP  metolazone (ZAROXOLYN) 2.5 MG tablet Take 2.5 mg by mouth every other day. 04/09/12  Yes Nada Boozer, NP  metoprolol succinate (TOPROL-XL) 25 MG 24 hr tablet Take 37.5 mg by mouth 2 (two) times daily. 04/09/12  Yes Nada Boozer, NP  omeprazole (PRILOSEC) 20 MG capsule Take 20 mg by mouth every morning.    Yes Historical Provider, MD  potassium chloride SA (K-DUR,KLOR-CON) 20 MEQ tablet Take 10 mEq by mouth 2 (two) times daily. 04/09/12  Yes Nada Boozer, NP     Physical Exam: Filed Vitals:   08/07/12 1220 08/07/12 1250  BP: 115/60   Pulse: 79   Temp: 97.9 F (36.6 C) 97.6 F (36.4 C)  TempSrc: Oral   Resp: 16   SpO2: 100%      Constitutional: Vital signs reviewed.  Patient is a well-developed and well-nourished in no acute distress and cooperative with exam. Alert and oriented x3.  Head: Normocephalic and atraumatic  Ear: TM normal bilaterally  Mouth: no erythema or exudates, MMM  Eyes: PERRL, EOMI, conjunctivae normal, No scleral icterus.  Neck: Supple, Trachea midline normal ROM, No JVD, mass, thyromegaly, or carotid bruit present.  Cardiovascular: RRR, S1 normal, S2 normal, no MRG, pulses symmetric and intact bilaterally  Pulmonary/Chest: CTAB, no wheezes, rales, or rhonchi  Abdominal: Soft. Non-tender, non-distended, bowel sounds are normal, no masses, organomegaly, or guarding present.  GU: no CVA tenderness Musculoskeletal: No joint deformities, erythema, or stiffness, ROM full and no nontender Ext: no edema and no cyanosis, pulses palpable bilaterally (DP and PT)  Hematology: no cervical, inginal, or axillary adenopathy.  Neurological: A&O x3, Strenght is normal and symmetric bilaterally, cranial nerve II-XII show left facial droop otherwise grossly intact, no focal motor deficit, sensory intact to light touch bilaterally.  Skin: Warm, dry and intact. No rash, cyanosis, or  clubbing.  Psychiatric: Normal mood and affect. speech and behavior is normal. Judgment and thought content normal. Cognition and memory are normal.       Labs on Admission:    Basic Metabolic Panel:  Lab 08/07/12 1610 08/07/12 1200  NA 139 139  K 2.8* 2.9*  CL 92* 92*  CO2 -- 34*  GLUCOSE 197* 211*  BUN 51* 54*  CREATININE 1.60* 1.82*  CALCIUM -- 9.7  MG -- --  PHOS -- --   Liver Function Tests:  Lab 08/07/12 1200  AST 24  ALT 12  ALKPHOS 86  BILITOT 0.7  PROT 7.3  ALBUMIN 3.1*   No results found for this basename: LIPASE:5,AMYLASE:5 in the last 168 hours No results found for this basename: AMMONIA:5 in the last 168 hours CBC:  Lab 08/07/12 1209 08/07/12 1200  WBC -- 11.7*  NEUTROABS -- 8.6*  HGB 14.3 13.4  HCT 42.0 40.1  MCV -- 91.1  PLT -- 214   Cardiac Enzymes:  Lab 08/07/12 1200  CKTOTAL --  CKMB --  CKMBINDEX --  TROPONINI <0.30    BNP (last 3 results)  Basename 04/09/12 0600 04/06/12 0420 04/05/12 0530  PROBNP 2461.0* 2500.0* 2750.0*      CBG:  Lab 08/07/12 1230  GLUCAP 209*    Radiological Exams on Admission: Ct Head Wo Contrast  08/07/2012  *RADIOLOGY REPORT*  Clinical Data: Code stroke.  Left facial droop.  CT HEAD WITHOUT CONTRAST  Technique:  Contiguous axial images were obtained from the base of the skull through the vertex without contrast.  Comparison: None.  Findings: Mild age related volume loss. No acute intracranial abnormality.  Specifically, no hemorrhage, hydrocephalus, mass lesion, acute infarction, or significant intracranial injury.  No acute calvarial abnormality. Visualized paranasal sinuses and mastoids clear.  Orbital soft tissues unremarkable.  IMPRESSION: Unremarkable for patient's age.  Critical Value/emergent results were called by telephone at the time of interpretation on 08/07/2012 at 12:05 p.m. to Dr. Amada Jupiter, who verbally acknowledged these results.   Original Report Authenticated By: Charlett Nose, M.D.       EKG: Independently reviewed. Shows normal sinus rhythm  Assessment/Plan   1. Probable CVA patient will be admitted for her. Workup history of arrhythmias. Continue aspirin for now obtain a carotid Doppler, 2-D echo, MRI/MRA of the brain 2. Diabetes type 2 insulin-dependent start the patient on sliding scale insulin check hemoglobin A1c lipid panel 3. Hypokalemia replete 4. Chronic kidney disease stage III, baseline creatinine of  about 1.6-1.8 at baseline 5. Chronic diastolic heart failure NYHA class III without exacerbation continue Zaroxolyn and Lasix  Code Status:   full Family Communication: bedside Disposition Plan: admit   Time spent: 70 mins   [facility] Pager [phone]  If 7PM-7AM, please contact night-coverage [email or web address] Password [email or web address] 08/07/2012, 1:45 PM

## 2012-08-07 NOTE — ED Notes (Signed)
Pt to ED from home by EMS with c/o stroke. Pt was with friend this morning eating breakfast and ready to go to Dr. Isidore Moos for regular visit at 830. The  friend noted that pt was slurred speech with facial droop. When pt arrived to Dr. Isidore Moos, it was noted that pt have problem with balance and drag her feet. EMS reports vitals BP 170/86, Pulse 74, O2 97% on room air, CBG 202.

## 2012-08-07 NOTE — ED Notes (Signed)
Unit called for report, will call back to get report.

## 2012-08-07 NOTE — Progress Notes (Signed)
Late Entry;  Pt  79 year old [place] American female admitted from ED from home of code stroke. Initial assessment done the patient awake ,alert and oriented x 4 with slurred speech and left facial droop. Vital signs stable.. Pt remain NPO /t failed  Bedside swallow  Evaluation.for Speech /language evaluation in am. Cardiac monitor in use ,NSR. Denied pain no acute distress noted.  Admission Rn notified for Admission history.Pt care endorsed to oncoming RN  Tobi Bastos. Azzie Roup Rn

## 2012-08-07 NOTE — ED Notes (Signed)
EKG handed to Dr. Oletta Lamas at bedside.  Extra copy placed in pt chart, no old in MUSE

## 2012-08-07 NOTE — Consult Note (Signed)
Reason for Consult:Facial droop Referring Physician: Leander Rams  CC: facial droop  History is obtained from: Patient  HPI: [name] is a 79 y.o. female who was eating breakfast around 8:30 am when her friend noticed a facial droop. She went to her PCP who noticed she was drooling out of that side of her mouth and referred her to the ER. She was outside of the three hour window and her symptoms were very mild and nonfunction limiting on presentation and therefore in the setting of DM she was not given tPA.   LSN: 8:30 am tPA given: outside of 3 hour window with mild deficits   ROS: A 14 point ROS was performed and is negative except as noted in the HPI.  Past Medical History  Diagnosis Date  . Hypertension   . Venous stasis of lower extremity WEARS TEDS    BLE--  CURRENTLY NO ULCERS/ WOUNDS  . Aortic insufficiency     mild/H&P 04/02/2012  . Type II diabetes mellitus   . OSA on CPAP   . Pulmonary hypertension per last echo 03-28-2012  mod.-severe     per dr wert note this related to L ht pressures from diastolic chf  . Peripheral vascular disease   . Hypertrophic obstructive cardiomyopathy   . Arthritis   . CKD (chronic kidney disease) stage 3, GFR 30-59 ml/min   . Heart murmur   . History of DVT of lower extremity lower right leg nov 2012  . Hyperlipidemia   . History of gout 06-09-2012  per pt stable  . Chronic diastolic CHF (congestive heart failure), NYHA class 3 CARDIOLOGIST- DR HILTY--  LOV OCT 2013--  REQUESTED NOTE, EGK AND STRESS TEST    PER PT ON 06-09-2012  S&S OF CHF AND WT IS DOWN  . H/O hiatal hernia   . GERD (gastroesophageal reflux disease)   . Pain of right thumb TMC  -- OSTEOARTHRITIS  . Lower extremity edema     Family History: No history of cva  Social History: Tob: none  Exam: Current vital signs: BP 124/65  Pulse 72  Temp 97.6 F (36.4 C) (Oral)  Resp 17  SpO2 100% Vital signs in last 24 hours: Temp:  [97.6 F (36.4 C)-97.9 F (36.6 C)]  97.6 F (36.4 C) (01/23 1250) Pulse Rate:  [72-79] 72  (01/23 1300) Resp:  [16-18] 17  (01/23 1300) BP: (98-124)/(44-65) 124/65 mmHg (01/23 1300) SpO2:  [100 %] 100 % (01/23 1300)  General: in bed, nad CV: RRR Mental Status: Patient is awake, alert, oriented to person, place, month, year, and situation. Immediate and remote memory are intact. Patient is able to give a clear and coherent history. No signs of aphasia.  Cranial Nerves: II: Visual Fields are full. Pupils are equal, round, and reactive to light.  Discs are difficult to visualize. III,IV, VI: EOMI without ptosis or diploplia.  V: Facial sensation is symmetric to temperature VII: Facial movement is notable for a mild left facial droop. VIII: hearing is intact to voice X: Uvula elevates symmetrically XI: Shoulder shrug is symmetric. XII: tongue is midline without atrophy or fasciculations.  Motor: Tone is normal. Bulk is normal. 5/5 strength was present in all four extremities.  Sensory: Intact on right, decreased to LT on left arm and leg, intact on face. She has neglect to double simultaneous sensation on the left.  Deep Tendon Reflexes: 2+ and symmetric in the biceps and patellae.  Cerebellar: FNF and HKS are intact bilaterally Gait:  Patient able to ambulate, states that this is her baseline(uses cane at baseline)   I have reviewed labs in epic and the results pertinent to this consultation are: BMP - hypokalemia  I have reviewed the images obtained:CT head - no acute findings.   Impression: 79 yo F with a likely new infarct. tPA not given as described above(> 3 hours). She will need a stroke workup.   Recommendations: 1. HgbA1c, fasting lipid panel 2. MRI, MRA  of the brain without contrast 3. PT consult, OT consult, Speech consult 4. Echocardiogram 5. Carotid dopplers 6. Prophylactic therapy-Antiplatelet med: Aspirin - dose 300mg  PR or 325mg  PO depending on swallow screen results.  7. Risk factor  modification 8. Telemetry monitoring 9. Frequent neuro checks  Ritta Slot, MD [facility] [phone]  If 7pm- 7am, please page neurology on call at [phone].

## 2012-08-07 NOTE — Progress Notes (Signed)
Utilization review completed.  P.J. Keanan Melander,RN,BSN Case Manager [phone]  

## 2012-08-07 NOTE — Progress Notes (Signed)
*  PRELIMINARY RESULTS* Vascular Ultrasound Carotid Duplex (Doppler) has been completed.  Preliminary findings: Bilateral:  No evidence of hemodynamically significant internal carotid artery stenosis.   Vertebral artery flow is antegrade.      Farrel Demark, RDMS, RVT 08/07/2012, 4:35 PM

## 2012-08-07 NOTE — Progress Notes (Signed)
  Echocardiogram 2D Echocardiogram has been performed.  [name] 08/07/2012, 4:54 PM

## 2012-08-07 NOTE — ED Notes (Signed)
Pt to Echo, pt will come back to ED then taken to the Unit.

## 2012-08-07 NOTE — ED Provider Notes (Signed)
History     CSN: [id number]  Arrival date & time 08/07/12  1145   First MD Initiated Contact with Patient 08/07/12 1224      Chief Complaint  Patient presents with  . Code Stroke    (Consider location/radiation/quality/duration/timing/severity/associated sxs/prior treatment) HPI Comments: Patient presents from an office by EMS do to slurred speech and a facial droop that began approximately 8:30 this morning. The patient had a friend who is eating with the patient and also the patient herself was quite certain that she began to feel unusual at 8:30 this morning. She normally gets up at about 5 AM and had been out for a few hours before the symptoms began. Patient came to the [facility] department billed as a code stroke. The rapid response team as well as the [facility] neuro-hospitalist was at the patient's bedside upon arrival. The patient denies any headache. She did endorse some changes in vision, difficulty speaking and some weakness on the left side. She apparently had an eye appointment this morning and had a apparently gone there already and EMS was called to the office and brought here after finding that the patient had poor balance.  The history is provided by the patient and the EMS personnel.    Past Medical History  Diagnosis Date  . Hypertension   . Venous stasis of lower extremity WEARS TEDS    BLE--  CURRENTLY NO ULCERS/ WOUNDS  . Aortic insufficiency     mild/H&P 04/02/2012  . Type II diabetes mellitus   . OSA on CPAP   . Pulmonary hypertension per last echo 03-28-2012  mod.-severe     per dr wert note this related to L ht pressures from diastolic chf  . Peripheral vascular disease   . Hypertrophic obstructive cardiomyopathy   . Arthritis   . CKD (chronic kidney disease) stage 3, GFR 30-59 ml/min   . Heart murmur   . History of DVT of lower extremity lower right leg nov 2012  . Hyperlipidemia   . History of gout 06-09-2012  per pt stable  . Chronic diastolic CHF  (congestive heart failure), NYHA class 3 CARDIOLOGIST- DR HILTY--  LOV OCT 2013--  REQUESTED NOTE, EGK AND STRESS TEST    PER PT ON 06-09-2012  S&S OF CHF AND WT IS DOWN  . H/O hiatal hernia   . GERD (gastroesophageal reflux disease)   . Pain of right thumb TMC  -- OSTEOARTHRITIS  . Lower extremity edema     Past Surgical History  Procedure Date  . Total knee arthroplasty 1988; 1998 X2    right; bilateral  . Tumor excision 1970's    "off my stomach"  . Cataract extraction w/ intraocular lens implant ~ 2010    right; "put lens in; didn't work; had mass on my eye"  . Transthoracic echocardiogram 04-03-2012   DR KENNETH HILTY    LVSF  EF 80-85%/ HYPERTRONIC CARDIOMYOPATHY WITH MOSTLY MID-CAVITARY OBSTRUCTION AND A SMALLER COMPONENT OF LV OUTFLOW TRACT OBSTRUCTION/   EVIDENCE OF SEVERE DECOMPRESATION OF DIASTOLIC HEART FAILURE AND MODERATE TO SEVERE PULMONARY ARTERIAL HYPERTENSION  . Joint replacement     TOTAL RIGHT KNEE X2  1988  &  1998 (DUE TO JOINT MALFUNCTION)   TOTAL LEFT KNEE 1998  . Carpometacarpel suspension plasty 06/16/2012    Procedure: CARPOMETACARPEL [facility]) SUSPENSION PLASTY;  Surgeon: Jodi Marble, MD;  Location: [facility];  Service: Orthopedics;  Laterality: Right;  RIGHT THUMB SUSPENSION ARTHROPLASTY.    History reviewed.  No pertinent family history.  History  Substance Use Topics  . Smoking status: Never Smoker   . Smokeless tobacco: Never Used  . Alcohol Use: No    OB History    Grav Para Term Preterm Abortions TAB SAB Ect Mult Living                  Review of Systems  Constitutional: Negative for fever and chills.  Respiratory: Negative for shortness of breath.   Cardiovascular: Negative for chest pain.  Neurological: Positive for dizziness, speech difficulty, weakness and numbness. Negative for headaches.  All other systems reviewed and are negative.    Allergies  Review of patient's allergies indicates no known allergies.  Home  Medications   Current Outpatient Rx  Name  Route  Sig  Dispense  Refill  . ALLOPURINOL 300 MG PO TABS   Oral   Take 150 mg by mouth daily.         . ASPIRIN 81 MG PO TABS   Oral   Take 81 mg by mouth daily.          Marland Kitchen CLONIDINE HCL 0.2 MG PO TABS   Oral   Take 0.2 mg by mouth 2 (two) times daily.          . FUROSEMIDE 40 MG PO TABS   Oral   Take 40 mg by mouth 2 (two) times daily.          Marland Kitchen GLIMEPIRIDE 4 MG PO TABS   Oral   Take 4 mg by mouth daily before breakfast.          . INSULIN LISPRO PROT & LISPRO (75-25) 100 UNIT/ML [place] SUSP   Subcutaneous   Inject 6-10 Units into the skin 2 (two) times daily with a meal. And 10 units am and 6 units pm         . METOLAZONE 2.5 MG PO TABS   Oral   Take 2.5 mg by mouth every other day.         Marland Kitchen METOPROLOL SUCCINATE ER 25 MG PO TB24   Oral   Take 37.5 mg by mouth 2 (two) times daily.         Marland Kitchen OMEPRAZOLE 20 MG PO CPDR   Oral   Take 20 mg by mouth every morning.          Marland Kitchen POTASSIUM CHLORIDE CRYS ER 20 MEQ PO TBCR   Oral   Take 10 mEq by mouth 2 (two) times daily.           BP 115/60  Pulse 79  Temp 97.6 F (36.4 C) (Oral)  Resp 16  SpO2 100%  Physical Exam  Nursing note and vitals reviewed. Constitutional: She appears well-developed and well-nourished. No distress.  HENT:  Head: Normocephalic and atraumatic.  Eyes: EOM are normal.  Neck: Neck supple.  Cardiovascular: Normal rate.   Pulmonary/Chest: Effort normal. No respiratory distress.  Abdominal: Soft. She exhibits no distension. There is no tenderness.  Musculoskeletal:       Bilateral edema, asymmetric, worse on the right lower extremity. Patient has a glove on her right hand because she had prior hand surgery for severe arthritis of her right thumb.  Neurological: She is alert. No cranial nerve deficit or sensory deficit. She exhibits normal muscle tone. Coordination abnormal.       Positive arm drift on left  Skin: Skin is warm and  dry. No rash noted. She is not diaphoretic.  Psychiatric:  She has a normal mood and affect.    ED Course  Procedures (including critical care time)  Labs Reviewed  CBC - Abnormal; Notable for the following:    WBC 11.7 (*)     RDW 15.7 (*)     All other components within normal limits  DIFFERENTIAL - Abnormal; Notable for the following:    Neutro Abs 8.6 (*)     All other components within normal limits  COMPREHENSIVE METABOLIC PANEL - Abnormal; Notable for the following:    Potassium 2.9 (*)     Chloride 92 (*)     CO2 34 (*)     Glucose, Bld 211 (*)     BUN 54 (*)     Creatinine, Ser 1.82 (*)     Albumin 3.1 (*)     GFR calc non Af Amer 26 (*)     GFR calc Af Amer 30 (*)     All other components within normal limits  POCT I-STAT, CHEM 8 - Abnormal; Notable for the following:    Potassium 2.8 (*)     Chloride 92 (*)     BUN 51 (*)     Creatinine, Ser 1.60 (*)     Glucose, Bld 197 (*)     Calcium, Ion 1.11 (*)     All other components within normal limits  POCT I-STAT TROPONIN I - Abnormal; Notable for the following:    Troponin i, poc 0.20 (*)     All other components within normal limits  GLUCOSE, CAPILLARY - Abnormal; Notable for the following:    Glucose-Capillary 209 (*)     All other components within normal limits  ETHANOL  PROTIME-INR  APTT  TROPONIN I  URINALYSIS, ROUTINE W REFLEX MICROSCOPIC  URINE RAPID DRUG SCREEN (HOSP PERFORMED)   Ct Head Wo Contrast  08/07/2012  *RADIOLOGY REPORT*  Clinical Data: Code stroke.  Left facial droop.  CT HEAD WITHOUT CONTRAST  Technique:  Contiguous axial images were obtained from the base of the skull through the vertex without contrast.  Comparison: None.  Findings: Mild age related volume loss. No acute intracranial abnormality.  Specifically, no hemorrhage, hydrocephalus, mass lesion, acute infarction, or significant intracranial injury.  No acute calvarial abnormality. Visualized paranasal sinuses and mastoids clear.   Orbital soft tissues unremarkable.  IMPRESSION: Unremarkable for patient's age.  Critical Value/emergent results were called by telephone at the time of interpretation on 08/07/2012 at 12:05 p.m. to Dr. Amada Jupiter, who verbally acknowledged these results.   Original Report Authenticated By: Charlett Nose, M.D.      1. Stroke     Oxygen saturation is 100% and I interpret this to be normal.    MDM   Patient with persistent symptoms of stroke although symptoms seem improved compared to report by EMS. Patient still has some mild drift on exam from he. Patient has no prior history of stroke but has several risk factors including hypertension and diabetes. Patient was seen by the neuro hospitalist who believes the patient had a mild stroke. Plan is to admit to the hospitalist and obtain an MRI and continue workup and evaluation for stroke. Patient's blood pressure is currently normotensive. I've spoken to the hospitalist who will admit the patient to telemetry floor.        Gavin Pound. Esten Dollar, MD 08/07/12 1416

## 2012-08-07 NOTE — Code Documentation (Signed)
79 yo female who was at home in her usual state of health this AM. A friend came by to take her to a MD appt. They were having breakfast and the friend noted a sudden L facial droop at 0830.  She also noted OJ dripping from the L side of her mouth when she attempted to take her pills.  She proceeded to take her to her MD appt, and there she had diff getting on the scales.  She normally uses a cane for safety.  Once seen by the MD, they noted the facial droop and drooling, and called EMS.  [facility] activated code stroke at 1136, she arrived at 41, cleared by EDP at 1146, stroke team arrived 73.  CT scan showed no acute abnormality. NIHSS was 5 (see doc flowsheets); she first said it was February, but quickly corrected that. She is still drooling and noted to have mild L facial droop, she has sensory loss on the L,, mild dysarthria, and 1 for inattention (L).  Her BP is a little low (she had her HTN meds this AM); 500 cc NS bolus infusing.  She tolerated sitting on the side of the bed, so we stood her and she was able to walk w/o assist a few steps away from the bed and back.  Decision made not to treat with thrombolytics since sx are mild and 4 hrs since LKW.  Options explained to the pt and she is agreeable.  EDP and RN updated on plan and pt's condition.  Family arrived at 26 and update re: above.

## 2012-08-08 LAB — GLUCOSE, CAPILLARY
Glucose-Capillary: 170 mg/dL — ABNORMAL HIGH (ref 70–99)
Glucose-Capillary: 190 mg/dL — ABNORMAL HIGH (ref 70–99)
Glucose-Capillary: 119 mg/dL — ABNORMAL HIGH (ref 70–99)

## 2012-08-08 LAB — LIPID PANEL
Total CHOL/HDL Ratio: 3.7 RATIO
VLDL: 22 mg/dL (ref 0–40)

## 2012-08-08 MED ORDER — INSULIN ASPART 100 UNIT/ML ~~LOC~~ SOLN: 0.0000 [IU] | Freq: Every day | SUBCUTANEOUS | Status: DC

## 2012-08-08 MED ORDER — INSULIN ASPART 100 UNIT/ML ~~LOC~~ SOLN
0.0000 [IU] | Freq: Three times a day (TID) | SUBCUTANEOUS | Status: DC
  Administered 2012-08-08: 2 [IU] via SUBCUTANEOUS
  Administered 2012-08-08 – 2012-08-09 (×2): 1 [IU] via SUBCUTANEOUS

## 2012-08-08 MED ORDER — CLOPIDOGREL BISULFATE 75 MG PO TABS
75.0000 mg | ORAL_TABLET | Freq: Every day | ORAL | Status: DC
  Administered 2012-08-08 – 2012-08-09 (×2): 75 mg via ORAL
  Filled 2012-08-08 (×3): qty 1

## 2012-08-08 MED ORDER — PANTOPRAZOLE SODIUM 40 MG PO TBEC
40.0000 mg | DELAYED_RELEASE_TABLET | Freq: Every day | ORAL | Status: DC
  Administered 2012-08-08: 40 mg via ORAL
  Filled 2012-08-08: qty 1

## 2012-08-08 NOTE — Progress Notes (Signed)
Stroke Team Progress Note  HISTORY [name] is a 79 y.o. female who was eating breakfast around 8:30 am 08/07/2012 when her friend noticed a facial droop. She went to her PCP who noticed she was drooling out of that side of her mouth and referred her to the ER. She was outside of the three hour window and her symptoms were very mild and nonfunction limiting on presentation and therefore in the setting of DM she was not given tPA. She was admitted for further evaluation and treatment.  SUBJECTIVE No family is at the bedside.  Overall she feels her condition is stable.   OBJECTIVE Most recent Vital Signs: Filed Vitals:   08/08/12 0226 08/08/12 0428 08/08/12 0627 08/08/12 1052  BP: 153/62 154/52 136/63 146/66  Pulse: 89 80 87 71  Temp: 97.7 F (36.5 C) 98.2 F (36.8 C) 97.9 F (36.6 C) 97.5 F (36.4 C)  TempSrc: Oral Oral Oral   Resp: 19 18 20 20   Height:      Weight:      SpO2: 100% 100% 100% 100%   CBG (last 3)   Basename 08/08/12 0646 08/07/12 2223 08/07/12 1802  GLUCAP 170* 137* 121*    IV Fluid Intake:     MEDICATIONS    . aspirin  150 mg Rectal Daily  . enoxaparin (LOVENOX) injection  40 mg Subcutaneous Q24H  . furosemide  40 mg Oral BID  . glimepiride  4 mg Oral QAC breakfast  . insulin aspart  0-5 Units Subcutaneous QHS  . insulin aspart  0-9 Units Subcutaneous TID WC  . insulin aspart protamine-insulin aspart  10 Units Subcutaneous BID WC  . metolazone  2.5 mg Oral QODAY  . metoprolol succinate  37.5 mg Oral BID  . pantoprazole (PROTONIX) IV  40 mg Intravenous QHS  . potassium chloride SA  10 mEq Oral BID   PRN:  metoprolol, senna-docusate  Diet:  Carb Control thin liquids Activity:   DVT Prophylaxis:  Lovenox 40 mg sq daily   CLINICALLY SIGNIFICANT STUDIES Basic Metabolic Panel:  Lab 08/07/12 9604 08/07/12 1200  NA 139 139  K 2.8* 2.9*  CL 92* 92*  CO2 -- 34*  GLUCOSE 197* 211*  BUN 51* 54*  CREATININE 1.60* 1.82*  CALCIUM -- 9.7  MG -- --    PHOS -- --   Liver Function Tests:  Lab 08/07/12 1200  AST 24  ALT 12  ALKPHOS 86  BILITOT 0.7  PROT 7.3  ALBUMIN 3.1*   CBC:  Lab 08/07/12 1209 08/07/12 1200  WBC -- 11.7*  NEUTROABS -- 8.6*  HGB 14.3 13.4  HCT 42.0 40.1  MCV -- 91.1  PLT -- 214   Coagulation:  Lab 08/07/12 1200  LABPROT 15.0  INR 1.20   Cardiac Enzymes:  Lab 08/07/12 1200  CKTOTAL --  CKMB --  CKMBINDEX --  TROPONINI <0.30   Urinalysis:  Lab 08/07/12 1330  COLORURINE YELLOW  LABSPEC 1.011  PHURINE 7.5  GLUCOSEU NEGATIVE  HGBUR NEGATIVE  BILIRUBINUR NEGATIVE  KETONESUR NEGATIVE  PROTEINUR NEGATIVE  UROBILINOGEN 0.2  NITRITE NEGATIVE  LEUKOCYTESUR NEGATIVE   Lipid Panel    Component Value Date/Time   CHOL 111 08/08/2012 0548   TRIG 110 08/08/2012 0548   HDL 30* 08/08/2012 0548   CHOLHDL 3.7 08/08/2012 0548   VLDL 22 08/08/2012 0548   LDLCALC 59 08/08/2012 0548   HgbA1C  No results found for this basename: HGBA1C    Urine Drug Screen:  Component Value Date/Time   LABOPIA NONE DETECTED 08/07/2012 1330   COCAINSCRNUR NONE DETECTED 08/07/2012 1330   LABBENZ NONE DETECTED 08/07/2012 1330   AMPHETMU NONE DETECTED 08/07/2012 1330   THCU NONE DETECTED 08/07/2012 1330   LABBARB NONE DETECTED 08/07/2012 1330    Alcohol Level:  Lab 08/07/12 1200  ETH <11   CT of the brain  08/07/2012 Unremarkable for patient's age.   MRI of the brain  08/07/2012  Moderate sized acute right periopercular / sub insular infarct. Questionable minimal hemorrhage along the periphery of this infarct (series 6 image 12).   MRA of the brain  08/07/2012   Intracranial atherosclerotic type changes   2D Echocardiogram    Carotid Doppler  No evidence of hemodynamically significant internal carotid artery stenosis. Vertebral artery flow is antegrade.   CXR  08/07/2012  No acute abnormality.  Stable cardiomegaly.   EKG  normal sinus rhythm.   Therapy Recommendations   Physical Exam   Pleasant middle aged lady not  in distress.Awake alert. Afebrile. Head is nontraumatic. Neck is supple without bruit. Hearing is normal. Cardiac exam no murmur or gallop. Lungs are clear to auscultation. Distal pulses are well felt.  Neurological Exam ; Awake alert oriented x 3 normal speech and language. Mild left lower face asymmetry.no dysarthria or aphasia. Fundi were not visualized. Vision acuity appears normal. Peripheral vision   slightly decreased in the left eye due to partial homonymous hemianopsia Tongue midline. No drift. Mild diminished fine finger movements on left. Orbits right over left upper extremity. Mild left grip weak.. Normal sensation . Normal coordination.  ASSESSMENT [name] is a 79 y.o. female presenting with facial droop . Imaging confirms a  right periopercular / sub insular  infarct. Infarct felt to be embolic secondary to unknown source.  Work up underway. On aspirin 81 mg orally every day prior to admission. Now on aspirin 150mg  suppository daily for secondary stroke prevention. Patient with resultant left partial homonymous hemianopia and decreased fine motor skills on the left.  Hypertension Diabetes, HgbA1c pending LDL 59 OSA on CPAP at home Pulmonary hypertension PVD  Hypertrophic obstructive cardiomyopathy   Hx DVT RLE 05/2011  Chronic diastolic CHR, class 3  Hospital day # 1  TREATMENT/PLAN  Change aspirin to clopidogrel 75 mg orally every day for secondary stroke prevention.  OOB. Therapy evals. TEE to look for embolic source. Please arrange with pts cardiologist or cardiologist of choice. Will need to be NPO after midnight. If positive for PFO (patent foramen ovale), check bilateral lower extremity venous dopplers to rule out DVT as possible source of stroke. Ok to do as an OP if pt stable for discharge this weekend. (I did not make arrangements for TEE) Please schedule outpatient telemetry monitoring to assess patient for atrial fibrillation as source of stroke. May be  arranged with patient's cardiologist, or cardiologist of choice.  Stroke team will follow while the patient is in the hospital Follow up Dr. Pearlean Brownie in 2 months  Annie Main, MSN, RN, ANVP-BC, ANP-BC, [facility] Pager: [phone] 08/08/2012 11:05 AM  I have personally obtained a history, examined the patient, evaluated imaging results, and formulated the assessment and plan of care. I agree with the above.   Delia Heady, MD Medical Director [facility] Pager: [phone] 08/08/2012 1:14 PM

## 2012-08-08 NOTE — Evaluation (Signed)
Clinical/Bedside Swallow Evaluation Patient Details  Name: [name] MRN: [id number] Date of Birth: [birth year]  Today's Date: 08/08/2012 Time: 0900-0925 SLP Time Calculation (min): 25 min  Past Medical History:  Past Medical History  Diagnosis Date  . Hypertension   . Venous stasis of lower extremity WEARS TEDS    BLE--  CURRENTLY NO ULCERS/ WOUNDS  . Aortic insufficiency     mild/H&P 04/02/2012  . Type II diabetes mellitus   . OSA on CPAP   . Pulmonary hypertension per last echo 03-28-2012  mod.-severe     per dr wert note this related to L ht pressures from diastolic chf  . Peripheral vascular disease   . Hypertrophic obstructive cardiomyopathy   . Arthritis   . CKD (chronic kidney disease) stage 3, GFR 30-59 ml/min   . Heart murmur   . History of DVT of lower extremity lower right leg nov 2012  . Hyperlipidemia   . History of gout 06-09-2012  per pt stable  . Chronic diastolic CHF (congestive heart failure), NYHA class 3 CARDIOLOGIST- DR HILTY--  LOV OCT 2013--  REQUESTED NOTE, EGK AND STRESS TEST    PER PT ON 06-09-2012  S&S OF CHF AND WT IS DOWN  . H/O hiatal hernia   . GERD (gastroesophageal reflux disease)   . Pain of right thumb TMC  -- OSTEOARTHRITIS  . Lower extremity edema    Past Surgical History:  Past Surgical History  Procedure Date  . Total knee arthroplasty 1988; 1998 X2    right; bilateral  . Tumor excision 1970's    "off my stomach"  . Cataract extraction w/ intraocular lens implant ~ 2010    right; "put lens in; didn't work; had mass on my eye"  . Transthoracic echocardiogram 04-03-2012   DR KENNETH HILTY    LVSF  EF 80-85%/ HYPERTRONIC CARDIOMYOPATHY WITH MOSTLY MID-CAVITARY OBSTRUCTION AND A SMALLER COMPONENT OF LV OUTFLOW TRACT OBSTRUCTION/   EVIDENCE OF SEVERE DECOMPRESATION OF DIASTOLIC HEART FAILURE AND MODERATE TO SEVERE PULMONARY ARTERIAL HYPERTENSION  . Joint replacement     TOTAL RIGHT KNEE X2  1988  &  1998 (DUE TO JOINT MALFUNCTION)    TOTAL LEFT KNEE 1998  . Carpometacarpel suspension plasty 06/16/2012    Procedure: CARPOMETACARPEL [facility]) SUSPENSION PLASTY;  Surgeon: Jodi Marble, MD;  Location: [facility];  Service: Orthopedics;  Laterality: Right;  RIGHT THUMB SUSPENSION ARTHROPLASTY.   HPI:  Patient is a 79 y.o. female, admitted on 1/23 secondary to symptoms of left facial droop, spillage of juice from left side of mouth. MRI revealed moderate sized Right peri opercular/sub-insular infarct.    Assessment / Plan / Recommendation Clinical Impression  Per this swallow assessment, patient's swallow function is WFL-WNL. Patient presents with mild left-sided facial and labial weakness, however no noticeable effect on speech intelligibilty or swallow function. Patient stated that she has a h/o GERD, and takes medication for it, but she denies any reflux symptoms, and denies avoiding any foods because of reflux. SLP provided patient with verbal education regarding stroke symptoms, potential dysphagia symptoms to monitor (coughing, throat clearing, watering eyes, pain during swallow), and swallow strategies. SLP also provided education regarding reflux precautions, however patient has previously been educated regarding this, and demonstrated understanding.    Aspiration Risk  Mild (secondary to CVA and h/o GERD)    Diet Recommendation Regular;Thin liquid   Liquid Administration via: Cup;Straw Supervision: Patient able to self feed Compensations: Slow rate;Small sips/bites Postural Changes and/or Swallow  Maneuvers: Seated upright 90 degrees;Upright 30-60 min after meal    Other  Recommendations     Follow Up Recommendations  None    Frequency and Duration        Pertinent Vitals/Pain     SLP Swallow Goals Patient will consume recommended diet without observed clinical signs of aspiration with: Independent assistance Patient will utilize recommended strategies during swallow to increase swallowing  safety with: Independent assistance   Swallow Study Prior Functional Status       General Date of Onset: 08/07/12 HPI: Patient is a 79 y.o. female, admitted on 1/23 secondary to symptoms of left facial droop, spillage of juice from left side of mouth. MRI revealed moderate sized Right peri opercular/sub-insular infarct.  Previous Swallow Assessment: N/A Diet Prior to this Study: NPO Temperature Spikes Noted: No Respiratory Status: Room air History of Recent Intubation: No Behavior/Cognition: Alert;Cooperative;Pleasant mood Oral Cavity - Dentition: Adequate natural dentition Self-Feeding Abilities: Able to feed self Patient Positioning: Upright in bed Baseline Vocal Quality: Clear Volitional Cough: Strong Volitional Swallow: Able to elicit    Oral/Motor/Sensory Function Overall Oral Motor/Sensory Function: Impaired Labial ROM: Reduced left (mild) Labial Symmetry: Abnormal symmetry left (mild) Labial Strength: Within Functional Limits Labial Sensation: Within Functional Limits Lingual ROM: Within Functional Limits Lingual Symmetry: Within Functional Limits Lingual Strength: Within Functional Limits Lingual Sensation: Within Functional Limits Facial ROM: Within Functional Limits Facial Symmetry: Left droop (very mild) Facial Strength: Within Functional Limits Facial Sensation: Within Functional Limits Velum: Within Functional Limits Mandible: Within Functional Limits   Ice Chips Ice chips: Not tested   Thin Liquid Thin Liquid: Within functional limits Presentation: Cup;Straw;Self Fed Other Comments: No overt s/s aspiration noted during this assessment    Nectar Thick Nectar Thick Liquid: Not tested   Honey Thick Honey Thick Liquid: Not tested   Puree Puree: Within functional limits Presentation: Self Fed;Spoon Other Comments: No overt s/s aspiration noted during this assessment   Solid   GO    Solid: Within functional limits Presentation: Self Fed Other Comments:  patient consumed graham crackers with peanut butter with no overt s/s aspiration and no observed difficulty or delays with mastication, oral control, or swallow initiation.       Elio Forget Tarrell 08/08/2012,11:53 AM    Angela Nevin, MA, CCC-SLP [facility] Speech-Language Pathologist

## 2012-08-08 NOTE — Evaluation (Signed)
Physical Therapy Evaluation Patient Details Name: [name] MRN: [id number] DOB: 10-29-1933 Today's Date: 08/08/2012 Time: 4166-0630 PT Time Calculation (min): 12 min  PT Assessment / Plan / Recommendation Clinical Impression  Pt admitted with admitted with Facial droop and drooling.  MRI right periopercular / sub insular  infarct. Pt currently at baseline functional mobility, no further PT needs, will not follow    PT Assessment  Patent does not need any further PT services    Follow Up Recommendations  No PT follow up;Supervision - Intermittent    Does the patient have the potential to tolerate intense rehabilitation      Barriers to Discharge        Equipment Recommendations  None recommended by PT    Recommendations for Other Services     Frequency      Precautions / Restrictions Precautions Precautions: None Restrictions Weight Bearing Restrictions: No   Pertinent Vitals/Pain No complaints of pain.      Mobility  Bed Mobility Bed Mobility: Sitting - Scoot to Edge of Bed;Supine to Sit;Sit to Supine Supine to Sit: 6: Modified independent (Device/Increase time) Sitting - Scoot to Edge of Bed: 6: Modified independent (Device/Increase time) Sit to Supine: 6: Modified independent (Device/Increase time) Transfers Transfers: Sit to Stand;Stand to Sit Sit to Stand: 5: Supervision;With upper extremity assist;From chair/3-in-1 Stand to Sit: 5: Supervision;With upper extremity assist;To chair/3-in-1 Ambulation/Gait Ambulation/Gait Assistance: 4: Min guard Ambulation Distance (Feet): 150 Feet Assistive device: 1 person hand held assist Ambulation/Gait Assistance Details: 1 person HHA for support although pt only minimally using UE support. Pt ambulates with cane at home 24/7 Gait Pattern: Within Functional Limits Gait velocity: slow Stairs: No Modified Rankin (Stroke Patients Only) Pre-Morbid Rankin Score: No symptoms Modified Rankin: Slight disability    Shoulder  Instructions     Exercises     PT Diagnosis:    PT Problem List:   PT Treatment Interventions:     PT Goals    Visit Information  Last PT Received On: 08/08/12 Assistance Needed: +1    Subjective Data      Prior Functioning  Home Living Lives With: Spouse;Son Available Help at Discharge: Neighbor;Family Type of Home: House Home Access: Ramped entrance Home Layout: One level Bathroom Shower/Tub: Tub/shower unit;Door [employer] Toilet: Handicapped height Bathroom Accessibility: Yes How Accessible: Accessible via walker Home Adaptive Equipment: Grab bars around toilet;Grab bars in shower;Hand-held shower hose;Straight cane Prior Function Level of Independence: Needs assistance Needs Assistance: Transfers Transfer Assistance: requires assistance with tub transfer - min A.  Neighbor provides.   Able to Take Stairs?: Yes Driving: No Vocation: Retired Comments: Pt had recent surgery on Rt. thumb Communication Communication: No difficulties Dominant Hand: Right    Cognition  Overall Cognitive Status: Impaired Area of Impairment: Following commands Arousal/Alertness: Awake/alert Orientation Level: Appears intact for tasks assessed Behavior During Session: [facility] for tasks performed Following Commands: Follows multi-step commands inconsistently Cognition - Other Comments: Pt with difficulty following mult step commands for visual testing.  Pt. is a bit slow to process information.  Unsure if this is pt baseline, or new deficit    Extremity/Trunk Assessment Right Upper Extremity Assessment RUE ROM/Strength/Tone: Deficits RUE ROM/Strength/Tone Deficits: Pt with recent thumb surgery.  Hand with decreased AROM RUE Sensation: Deficits RUE Sensation Deficits: Pt reports numbness/tingling Rt hand due to old injury RUE Coordination: Deficits RUE Coordination Deficits: from thumb surgery Left Upper Extremity Assessment LUE ROM/Strength/Tone: Within functional levels LUE Sensation:  WFL - Light Touch LUE Coordination: WFL -  gross/fine motor Right Lower Extremity Assessment RLE ROM/Strength/Tone: Within functional levels RLE Sensation: WFL - Light Touch RLE Coordination: WFL - gross/fine motor Left Lower Extremity Assessment LLE ROM/Strength/Tone: Within functional levels LLE Sensation: WFL - Light Touch LLE Coordination: WFL - gross/fine motor Trunk Assessment Trunk Assessment: Normal   Balance    End of Session PT - End of Session Equipment Utilized During Treatment: Gait belt Activity Tolerance: Patient tolerated treatment well Patient left: in bed;with call bell/phone within reach;with family/visitor present Nurse Communication: Mobility status  GP     Milana Kidney 08/08/2012, 4:36 PM  08/08/2012 Milana Kidney DPT PAGER: [phone] OFFICE: [phone]

## 2012-08-08 NOTE — Progress Notes (Signed)
[facility] PROGRESS NOTE  [name] GEX:[id number] DOB: 20-Oct-1933 DOA: 08/07/2012 PCP: Alva Garnet., MD  Assessment/Plan: Moderate sized acute right periopercular/Sub insular infarct Continue Plavix.  Carotid Dopplers done with results as indicated below.  Continue PT/OT.  2-D echocardiogram pending.  Neurology consultation appreciated.  Neurology recommended TEE which can be done as outpatient.  Also recommended outpatient telemetry monitoring to assess patient for atrial fibrillation as source of stroke.  Diabetes type 2 insulin dependent Hemoglobin A1c 8.1.  Continue NovoLog 70/30, resume Humalog 75/25 at discharge.  Continue glimepiride.  Hypertension Continue metoprolol and clonidine.  GERD Continue omeprazole.  Chronic kidney disease stage III Renal function baseline.  Chronic diastolic heart failure Compensated.  Continue home Lasix and metolazone.  Hypokalemia Replace as needed.  Prophylaxis Lovenox  Code Status: Presumed full code. Family Communication: No family at bedside.  Disposition Plan: Pending.  Consultants:  Neurology  Procedures: Carotid Dopplers on 08/07/2012 Bilateral: No evidence of hemodynamically significant internal carotid artery stenosis. Vertebral artery flow is antegrade.  Antibiotics:  None.  HPI/Subjective: No specific concerns.  Feeling better.  Objective: Filed Vitals:   08/08/12 0226 08/08/12 0428 08/08/12 0627 08/08/12 1052  BP: 153/62 154/52 136/63 146/66  Pulse: 89 80 87 71  Temp: 97.7 F (36.5 C) 98.2 F (36.8 C) 97.9 F (36.6 C) 97.5 F (36.4 C)  TempSrc: Oral Oral Oral   Resp: 19 18 20 20   Height:      Weight:      SpO2: 100% 100% 100% 100%    Intake/Output Summary (Last 24 hours) at 08/08/12 1418 Last data filed at 08/08/12 1200  Gross per 24 hour  Intake    440 ml  Output      0 ml  Net    440 ml   Filed Weights   08/07/12 2028  Weight: 102 kg (224 lb 13.9 oz)    Exam: Physical  Exam: General: Awake, Oriented, No acute distress. HEENT: EOMI. Neck: Supple CV: S1 and S2 Lungs: Clear to ascultation bilaterally Abdomen: Soft, Nontender, Nondistended, +bowel sounds. Ext: Good pulses. Trace edema.  Data Reviewed: Basic Metabolic Panel:  Lab 08/07/12 0102 08/07/12 1200  NA 139 139  K 2.8* 2.9*  CL 92* 92*  CO2 -- 34*  GLUCOSE 197* 211*  BUN 51* 54*  CREATININE 1.60* 1.82*  CALCIUM -- 9.7  MG -- --  PHOS -- --   Liver Function Tests:  Lab 08/07/12 1200  AST 24  ALT 12  ALKPHOS 86  BILITOT 0.7  PROT 7.3  ALBUMIN 3.1*   No results found for this basename: LIPASE:5,AMYLASE:5 in the last 168 hours No results found for this basename: AMMONIA:5 in the last 168 hours CBC:  Lab 08/07/12 1209 08/07/12 1200  WBC -- 11.7*  NEUTROABS -- 8.6*  HGB 14.3 13.4  HCT 42.0 40.1  MCV -- 91.1  PLT -- 214   Cardiac Enzymes:  Lab 08/07/12 1200  CKTOTAL --  CKMB --  CKMBINDEX --  TROPONINI <0.30   BNP (last 3 results)  Basename 04/09/12 0600 04/06/12 0420 04/05/12 0530  PROBNP 2461.0* 2500.0* 2750.0*   CBG:  Lab 08/08/12 1116 08/08/12 0646 08/07/12 2223 08/07/12 1802 08/07/12 1230  GLUCAP 190* 170* 137* 121* 209*    No results found for this or any previous visit (from the past 240 hour(s)).   Studies: Dg Chest 2 View  08/07/2012  *RADIOLOGY REPORT*  Clinical Data: Stroke.  Hypertension.  Diabetes.  CHEST - 2 VIEW  Comparison:  04/03/2012.  Findings: Stable enlarged cardiac silhouette.  Clear lungs with normal vascularity.  Thoracic spine degenerative changes, including changes of DISH.  IMPRESSION: No acute abnormality.  Stable cardiomegaly.   Original Report Authenticated By: Beckie Salts, M.D.    Ct Head Wo Contrast  08/07/2012  *RADIOLOGY REPORT*  Clinical Data: Code stroke.  Left facial droop.  CT HEAD WITHOUT CONTRAST  Technique:  Contiguous axial images were obtained from the base of the skull through the vertex without contrast.  Comparison:  None.  Findings: Mild age related volume loss. No acute intracranial abnormality.  Specifically, no hemorrhage, hydrocephalus, mass lesion, acute infarction, or significant intracranial injury.  No acute calvarial abnormality. Visualized paranasal sinuses and mastoids clear.  Orbital soft tissues unremarkable.  IMPRESSION: Unremarkable for patient's age.  Critical Value/emergent results were called by telephone at the time of interpretation on 08/07/2012 at 12:05 p.m. to Dr. Amada Jupiter, who verbally acknowledged these results.   Original Report Authenticated By: Charlett Nose, M.D.    Mr Brain Wo Contrast  08/07/2012  *RADIOLOGY REPORT*  Clinical Data:  Left-sided facial droop.  Diabetic hypertensive patient with hyperlipidemia.  MRI BRAIN WITHOUT CONTRAST MRA HEAD WITHOUT CONTRAST  Technique: Multiplanar, multiecho pulse sequences of the brain and surrounding structures were obtained according to standard protocol without intravenous contrast.  Angiographic images of the head were obtained using MRA technique without contrast.  Comparison: 08/07/2012 head CT.  No comparison brain MR.  MRI HEAD  Findings:  Moderate sized acute right periopercular / sub insular infarct. Questionable minimal hemorrhage along the periphery of this infarct (series 6 image 12).  Global atrophy without hydrocephalus.  No intracranial mass lesion detected on this unenhanced exam.  Cervical spondylotic changes C3-4 with mild cord flattening.  Cervical medullary junction,  Cervical medullary junction, pituitary region and pineal region unremarkable.  Mild exophthalmos.  Minimal mucosal thickening ethmoid sinus air cells. Polypoid opacification in the left ethmoid sinus air cell.  IMPRESSION: Moderate sized acute right periopercular / sub insular infarct. Questionable minimal hemorrhage along the periphery of this infarct (series 6 image 12).  MRA HEAD  Findings: Motion degraded exam.  Mild narrowing and irregularity right internal carotid  artery cavernous segment.  Mild ectasia of left internal carotid artery cavernous segment.  Mild to moderate focal narrowing proximal A1 segment right anterior cerebral artery.  Middle cerebral artery branch vessel irregularity bilaterally with decreased number of visualized right middle cerebral artery branches consistent with the patient's acute infarct as detailed above.  There is mild narrowing and irregularity of the M1 segment of the right middle cerebral artery without high-grade stenosis.  Codominant vertebral arteries.  Irregular appearance of the PICA bilaterally.  Nonvisualization AICA bilaterally.  Mild narrowing proximal basilar artery.  Posterior cerebral artery and superior cerebellar artery distal branch vessel irregularity.  No aneurysm noted.  IMPRESSION: Intracranial atherosclerotic type changes as detailed above.  This has been made a PRA call report utilizing dashboard call feature.   Original Report Authenticated By: Lacy Duverney, M.D.    Mr Mra Head/brain Wo Cm  08/07/2012  *RADIOLOGY REPORT*  Clinical Data:  Left-sided facial droop.  Diabetic hypertensive patient with hyperlipidemia.  MRI BRAIN WITHOUT CONTRAST MRA HEAD WITHOUT CONTRAST  Technique: Multiplanar, multiecho pulse sequences of the brain and surrounding structures were obtained according to standard protocol without intravenous contrast.  Angiographic images of the head were obtained using MRA technique without contrast.  Comparison: 08/07/2012 head CT.  No comparison brain MR.  MRI HEAD  Findings:  Moderate sized acute right periopercular / sub insular infarct. Questionable minimal hemorrhage along the periphery of this infarct (series 6 image 12).  Global atrophy without hydrocephalus.  No intracranial mass lesion detected on this unenhanced exam.  Cervical spondylotic changes C3-4 with mild cord flattening.  Cervical medullary junction,  Cervical medullary junction, pituitary region and pineal region unremarkable.  Mild  exophthalmos.  Minimal mucosal thickening ethmoid sinus air cells. Polypoid opacification in the left ethmoid sinus air cell.  IMPRESSION: Moderate sized acute right periopercular / sub insular infarct. Questionable minimal hemorrhage along the periphery of this infarct (series 6 image 12).  MRA HEAD  Findings: Motion degraded exam.  Mild narrowing and irregularity right internal carotid artery cavernous segment.  Mild ectasia of left internal carotid artery cavernous segment.  Mild to moderate focal narrowing proximal A1 segment right anterior cerebral artery.  Middle cerebral artery branch vessel irregularity bilaterally with decreased number of visualized right middle cerebral artery branches consistent with the patient's acute infarct as detailed above.  There is mild narrowing and irregularity of the M1 segment of the right middle cerebral artery without high-grade stenosis.  Codominant vertebral arteries.  Irregular appearance of the PICA bilaterally.  Nonvisualization AICA bilaterally.  Mild narrowing proximal basilar artery.  Posterior cerebral artery and superior cerebellar artery distal branch vessel irregularity.  No aneurysm noted.  IMPRESSION: Intracranial atherosclerotic type changes as detailed above.  This has been made a PRA call report utilizing dashboard call feature.   Original Report Authenticated By: Lacy Duverney, M.D.     Scheduled Meds:   . clopidogrel  75 mg Oral Q breakfast  . enoxaparin (LOVENOX) injection  40 mg Subcutaneous Q24H  . furosemide  40 mg Oral BID  . glimepiride  4 mg Oral QAC breakfast  . insulin aspart  0-5 Units Subcutaneous QHS  . insulin aspart  0-9 Units Subcutaneous TID WC  . insulin aspart protamine-insulin aspart  10 Units Subcutaneous BID WC  . metolazone  2.5 mg Oral QODAY  . metoprolol succinate  37.5 mg Oral BID  . pantoprazole  40 mg Oral Q1200  . potassium chloride SA  10 mEq Oral BID   Continuous Infusions:   Principal Problem:  *CVA  (cerebrovascular accident) Active Problems:  Pulmonary hypertension  Sleep apnea on c-pap   Acute on chronic diastolic CHF (congestive heart failure), NYHA class 3  Chronic venous insufficiency with LE ulcers, will be followed at wound center  Diabetes mellitus type 2 in obese  Diastolic dysfunction stage 2 with an EF of 55% 2D 2012  Hypertrophic obstructive cardiomyopathy  CKD (chronic kidney disease) stage 3, GFR 30-59 ml/min    Lorenia Hoston A, MD  [facility] Pager [phone]. If 7PM-7AM, please contact night-coverage at [email or web address], password [facility] 08/08/2012, 2:18 PM  LOS: 1 day

## 2012-08-08 NOTE — Evaluation (Signed)
Occupational Therapy Evaluation Patient Details Name: [name] MRN: [id number] DOB: [birth year] Today's Date: 08/08/2012 Time: 4098-1191 OT Time Calculation (min): 35 min  OT Assessment / Plan / Recommendation Clinical Impression  This 79 y.o. female admitted with Facial droop and drooling.  MRI right periopercular / sub insular  infarct. Infarct .  Pt. with difficulty following multi step commands for visual testing, though no obvious deficit noted during functional activities.  Pt. a bit slow to process information.  Unsure of pt's baseline level of functioning.  Feel she would benefit from [facility] to eval pt in home setting to ensure pt return to baseline.    OT Assessment  All further OT needs can be met in the next venue of care    Follow Up Recommendations  Home health OT;Supervision/Assistance - 24 hour    Barriers to Discharge      Equipment Recommendations  None recommended by OT    Recommendations for Other Services    Frequency       Precautions / Restrictions         ADL  Eating/Feeding: Modified independent Where Assessed - Eating/Feeding: Chair Grooming: Brushing hair;Supervision/safety;Teeth care Where Assessed - Grooming: Supported standing Upper Body Bathing: Set up;Supervision/safety Where Assessed - Upper Body Bathing: Supported sitting;Unsupported sitting Lower Body Bathing: Supervision/safety Where Assessed - Lower Body Bathing: Supported sit to stand Upper Body Dressing: Set up;Supervision/safety Where Assessed - Upper Body Dressing: Supported standing;Unsupported sitting Lower Body Dressing: Supervision/safety Where Assessed - Lower Body Dressing: Supported sit to stand Toilet Transfer: Supervision/safety Statistician Method: Sit to Barista: Comfort height toilet;Grab bars Toileting - Architect and Hygiene: Supervision/safety Where Assessed - Engineer, mining and Hygiene: Standing Tub/Shower  Transfer: Minimal assistance Tub/Shower Transfer Method: Science writer: Shower seat with back Transfers/Ambulation Related to ADLs: Pt ambulates with supervision ADL Comments: Pt. requires increased time to complete tasks.  Requires min A for tub transfer due to long standing Rt. knee issue.      OT Diagnosis: Generalized weakness;Disturbance of vision;Cognitive deficits  OT Problem List: Impaired balance (sitting and/or standing);Impaired vision/perception;Decreased coordination;Decreased cognition OT Treatment Interventions:     OT Goals    Visit Information  Last OT Received On: 08/08/12 Assistance Needed: +1    Subjective Data  Subjective: "I think I'm normal except my speech is slurry sometimes" Patient Stated Goal: To go home   Prior Functioning     Home Living Lives With: Spouse;Son (son is disabled) Available Help at Discharge: Neighbor;Family (neighbor assist pt PRN) Type of Home: House Home Access: Ramped entrance Home Layout: One level Bathroom Shower/Tub: Tub/shower unit;Door [employer] Toilet: Handicapped height Bathroom Accessibility: Yes How Accessible: Accessible via walker Home Adaptive Equipment: Grab bars around toilet;Grab bars in shower;Hand-held shower hose;Straight cane Prior Function Level of Independence: Needs assistance Needs Assistance: Transfers Transfer Assistance: requires assistance with tub transfer - min A.  Neighbor provides.   Able to Take Stairs?: Yes Driving: No Vocation: Retired Comments: Pt had recent surgery on Rt. thumb Communication Communication: No difficulties Dominant Hand: Right         Vision/Perception Vision - Assessment Eye Alignment: Within Functional Limits Vision Assessment: Vision tested Ocular Range of Motion: Within Functional Limits Tracking/Visual Pursuits: Other (comment) (Pt loses fixation in all quadrants, more pronounce inferiorl) Visual Fields: Other (comment) (Pt. with  difficulty following instructions with testing) Additional Comments: Pt. with no obvious field deficit with confrontation testing, however, unsure if pt not guessing at times.  Pt. was noted to look to Rt. when therapist entered on Lt. and was a bit slow to look to Lt. initially.  However, pt located objects in bathroom with no difficulty, and negotiated obstacles in hallway without difficulty Perception Perception: Within Functional Limits Praxis Praxis: Intact   Cognition  Overall Cognitive Status: Impaired Area of Impairment: Following commands Arousal/Alertness: Awake/alert Orientation Level: Appears intact for tasks assessed Behavior During Session: [facility] for tasks performed Following Commands: Follows multi-step commands inconsistently Cognition - Other Comments: Pt with difficulty following mult step commands for visual testing.  Pt. is a bit slow to process information.  Unsure if this is pt baseline, or new deficit    Extremity/Trunk Assessment Right Upper Extremity Assessment RUE ROM/Strength/Tone: Deficits RUE ROM/Strength/Tone Deficits: Pt with recent thumb surgery.  Hand with decreased AROM RUE Sensation: Deficits RUE Sensation Deficits: Pt reports numbness/tingling Rt hand due to old injury RUE Coordination: Deficits RUE Coordination Deficits: from thumb surgery Left Upper Extremity Assessment LUE ROM/Strength/Tone: Within functional levels LUE Sensation: WFL - Light Touch LUE Coordination: WFL - gross/fine motor Trunk Assessment Trunk Assessment: Normal     Mobility Transfers Transfers: Sit to Stand;Stand to Sit Sit to Stand: 5: Supervision;With upper extremity assist;From chair/3-in-1 Stand to Sit: 5: Supervision;With upper extremity assist;To chair/3-in-1     Shoulder Instructions     Exercise     Balance     End of Session    GO     Jeani Hawking M 08/08/2012, 1:55 PM

## 2012-08-09 LAB — BASIC METABOLIC PANEL
BUN: 36 mg/dL — ABNORMAL HIGH (ref 6–23)
Calcium: 9.5 mg/dL (ref 8.4–10.5)
Creatinine, Ser: 1.48 mg/dL — ABNORMAL HIGH (ref 0.50–1.10)
GFR calc non Af Amer: 33 mL/min — ABNORMAL LOW (ref >90)
Glucose, Bld: 133 mg/dL — ABNORMAL HIGH (ref 70–99)
Sodium: 140 mEq/L (ref 135–145)

## 2012-08-09 LAB — GLUCOSE, CAPILLARY
Glucose-Capillary: 129 mg/dL — ABNORMAL HIGH (ref 70–99)
Glucose-Capillary: 132 mg/dL — ABNORMAL HIGH (ref 70–99)

## 2012-08-09 MED ORDER — CLOPIDOGREL BISULFATE 75 MG PO TABS: 75.0000 mg | ORAL_TABLET | Freq: Every day | ORAL | Status: DC

## 2012-08-09 MED ORDER — SENNOSIDES-DOCUSATE SODIUM 8.6-50 MG PO TABS: 1.0000 | ORAL_TABLET | Freq: Every evening | ORAL | Status: DC | PRN

## 2012-08-09 MED ORDER — SIMVASTATIN 10 MG PO TABS: 10.0000 mg | ORAL_TABLET | Freq: Every day | ORAL | Status: DC

## 2012-08-09 MED ORDER — POTASSIUM CHLORIDE CRYS ER 20 MEQ PO TBCR: 20.0000 meq | EXTENDED_RELEASE_TABLET | Freq: Two times a day (BID) | ORAL | Status: DC

## 2012-08-09 MED ORDER — SIMVASTATIN 10 MG PO TABS
10.0000 mg | ORAL_TABLET | Freq: Every day | ORAL | Status: DC
  Filled 2012-08-09: qty 1

## 2012-08-09 MED ORDER — POTASSIUM CHLORIDE CRYS ER 20 MEQ PO TBCR
20.0000 meq | EXTENDED_RELEASE_TABLET | Freq: Two times a day (BID) | ORAL | Status: DC
  Administered 2012-08-09: 20 meq via ORAL
  Filled 2012-08-09 (×2): qty 1

## 2012-08-09 NOTE — Progress Notes (Signed)
Stroke Team Progress Note  HISTORY [name] is a 79 y.o. female who was eating breakfast around 8:30 am 08/07/2012 when her friend noticed a facial droop. She went to her PCP who noticed she was drooling out of that side of her mouth and referred her to the ER. She was outside of the three hour window and her symptoms were very mild and nonfunction limiting on presentation and therefore in the setting of DM she was not given tPA. She was admitted for further evaluation and treatment.  SUBJECTIVE No family is at the bedside.  Overall she feels her condition improved. The patient states that she is to be discharged today.  OBJECTIVE Most recent Vital Signs: Filed Vitals:   08/08/12 2201 08/09/12 0142 08/09/12 0548 08/09/12 0928  BP: 120/43 107/59 125/95 138/58  Pulse: 77 68 65 72  Temp: 98.3 F (36.8 C) 98.3 F (36.8 C) 97.4 F (36.3 C) 97.4 F (36.3 C)  TempSrc: Oral Oral Oral   Resp: 18 18 18 20   Height:      Weight:      SpO2: 100% 98% 100% 100%   CBG (last 3)   Basename 08/09/12 0646 08/08/12 2204 08/08/12 1715  GLUCAP 129* 119* 140*    IV Fluid Intake:     MEDICATIONS     . clopidogrel  75 mg Oral Q breakfast  . enoxaparin (LOVENOX) injection  40 mg Subcutaneous Q24H  . furosemide  40 mg Oral BID  . glimepiride  4 mg Oral QAC breakfast  . insulin aspart  0-5 Units Subcutaneous QHS  . insulin aspart  0-9 Units Subcutaneous TID WC  . insulin aspart protamine-insulin aspart  10 Units Subcutaneous BID WC  . metolazone  2.5 mg Oral QODAY  . metoprolol succinate  37.5 mg Oral BID  . pantoprazole  40 mg Oral Q1200  . potassium chloride SA  20 mEq Oral BID   PRN:  metoprolol, senna-docusate  Diet:  Carb Control thin liquids Activity:  As tolerated DVT Prophylaxis:  Lovenox 40 mg sq daily   CLINICALLY SIGNIFICANT STUDIES Basic Metabolic Panel:   Lab 08/09/12 0650 08/07/12 1209 08/07/12 1200  NA 140 139 --  K 2.8* 2.8* --  CL 97 92* --  CO2 30 -- 34*  GLUCOSE  133* 197* --  BUN 36* 51* --  CREATININE 1.48* 1.60* --  CALCIUM 9.5 -- 9.7  MG 1.7 -- --  PHOS -- -- --   Liver Function Tests:   Lab 08/07/12 1200  AST 24  ALT 12  ALKPHOS 86  BILITOT 0.7  PROT 7.3  ALBUMIN 3.1*   CBC:   Lab 08/07/12 1209 08/07/12 1200  WBC -- 11.7*  NEUTROABS -- 8.6*  HGB 14.3 13.4  HCT 42.0 40.1  MCV -- 91.1  PLT -- 214   Coagulation:   Lab 08/07/12 1200  LABPROT 15.0  INR 1.20   Cardiac Enzymes:   Lab 08/07/12 1200  CKTOTAL --  CKMB --  CKMBINDEX --  TROPONINI <0.30   Urinalysis:   Lab 08/07/12 1330  COLORURINE YELLOW  LABSPEC 1.011  PHURINE 7.5  GLUCOSEU NEGATIVE  HGBUR NEGATIVE  BILIRUBINUR NEGATIVE  KETONESUR NEGATIVE  PROTEINUR NEGATIVE  UROBILINOGEN 0.2  NITRITE NEGATIVE  LEUKOCYTESUR NEGATIVE   Lipid Panel    Component Value Date/Time   CHOL 111 08/08/2012 0548   TRIG 110 08/08/2012 0548   HDL 30* 08/08/2012 0548   CHOLHDL 3.7 08/08/2012 0548   VLDL 22 08/08/2012 0548  LDLCALC 59 08/08/2012 0548   HgbA1C  Lab Results  Component Value Date   HGBA1C 8.1* 08/08/2012    Urine Drug Screen:     Component Value Date/Time   LABOPIA NONE DETECTED 08/07/2012 1330   COCAINSCRNUR NONE DETECTED 08/07/2012 1330   LABBENZ NONE DETECTED 08/07/2012 1330   AMPHETMU NONE DETECTED 08/07/2012 1330   THCU NONE DETECTED 08/07/2012 1330   LABBARB NONE DETECTED 08/07/2012 1330    Alcohol Level:   Lab 08/07/12 1200  ETH <11   CT of the brain  08/07/2012 Unremarkable for patient's age.   MRI of the brain  08/07/2012  Moderate sized acute right periopercular / sub insular infarct. Questionable minimal hemorrhage along the periphery of this infarct (series 6 image 12).   MRA of the brain  08/07/2012   Intracranial atherosclerotic type changes   2D Echocardiogram  - EF 60-65% LV - severe concentric hypertrophy. Dynamic obstruction at rest in the outflow tract.  Carotid Doppler  No evidence of hemodynamically significant internal carotid  artery stenosis. Vertebral artery flow is antegrade.   CXR  08/07/2012  No acute abnormality.  Stable cardiomegaly.   EKG  normal sinus rhythm.   Therapy Recommendations - Home health OT;Supervision/Assistance - 24 hour - No f/u PT   Physical Exam   Pleasant middle aged lady not in distress.Awake alert. Afebrile. Head is nontraumatic. Neurological Exam ; Awake alert oriented x 3 normal speech and language. Mild left lower face asymmetry.no dysarthria or aphasia. Fundi were not visualized. Vision acuity appears normal. Peripheral vision   slightly decreased in the left eye due to partial homonymous hemianopsia Tongue midline. No drift. Mild diminished fine finger movements on left. Orbits right over left upper extremity. Mild left grip weak.. Normal sensation . Normal coordination.   ASSESSMENT [name] is a 79 y.o. female presenting with facial droop . Imaging confirms a  right periopercular / sub insular  infarct. Infarct felt to be embolic secondary to unknown source.  Work up underway. On aspirin 81 mg orally every day prior to admission. Now on Plavix 75 mg daily for secondary stroke prevention. Patient with resultant left partial homonymous hemianopia and decreased fine motor skills on the left.  Hypertension Diabetes, HgbA1c  - 8.1 LDL 59 OSA on CPAP at home Pulmonary hypertension PVD  Hypertrophic obstructive cardiomyopathy   Hx DVT RLE 05/2011  Chronic diastolic CHR, class 3  Hospital day # 2  TREATMENT/PLAN  On clopidogrel 75 mg orally every day for secondary stroke prevention.  Therapy evals completed. Outpatient OT recommended. TEE to look for embolic source. Please arrange with pts cardiologist at [facility] to be done as an outpatient.  If positive for PFO (patent foramen ovale), check bilateral lower extremity venous dopplers to rule out DVT as possible source of stroke.   If pt stable for discharge this weekend schedule outpatient telemetry  monitoring to assess patient for atrial fibrillation as source of stroke. May be arranged with patient's cardiologist, or cardiologist of choice. Spoke with NP from [facility]. Stroke team will follow while the patient is in the hospital Probable discharged today. Need to better glucose control. Hemoglobin A1c 8.1. Follow up Dr. Pearlean Brownie in 2 months Start low dose statin per Dr. Loretha Brasil.  Delton See PA-C [facility] Pager [phone] 08/09/2012, 10:10 AM   I have personally obtained a history, examined the patient, evaluated imaging results, and formulated the assessment and plan of care. I agree with the above.    -  HbA1c, titer glycemic control - Neurological exam improved. No further facial droop noted. Decreased rapid alternation movements on L side, but 5-/5 b/l upper and lower extremity.  - Cardiology follow up as out pt and TEE to be scheduled.  - can be d/c from neurological stand point and follow up with Dr. Kathaleen Grinder, [facility]

## 2012-08-09 NOTE — Discharge Summary (Signed)
Physician Discharge Summary  [name]:[id number] DOB: 12-06-1933 DOA: 08/07/2012  PCP: Alva Garnet., MD  Admit date: 08/07/2012 Discharge date: 08/09/2012  Time spent: 40 minutes  Recommendations for Outpatient Follow-up:  Please followup with Alva Garnet., MD (PCP) in 1 week.  Please followup with Dr. Pearlean Brownie, neurology in 2 months.  Please followup with Dr. Rennis Golden, cardiology for TEE and telemetry monitoring.  Discharge Diagnoses:  Principal Problem:  *CVA (cerebrovascular accident) Active Problems:  Pulmonary hypertension  Sleep apnea on c-pap   Acute on chronic diastolic CHF (congestive heart failure), NYHA class 3  Chronic venous insufficiency with LE ulcers, will be followed at wound center  Diabetes mellitus type 2 in obese  Diastolic dysfunction stage 2 with an EF of 55% 2D 2012  Hypertrophic obstructive cardiomyopathy  CKD (chronic kidney disease) stage 3, GFR 30-59 ml/min   Discharge Condition: Stable  Diet recommendation: Diabetic diet  Filed Weights   08/07/12 2028  Weight: 102 kg (224 lb 13.9 oz)    History of present illness:  On admission: "79 yo female who was at home in her usual state of health this AM. A friend came by to take her to a MD appt. They were having breakfast and the friend noted a sudden L facial droop at 0830. She also noted OJ dripping from the L side of her mouth when she attempted to take her pills."  Hospital Course:  Moderate sized acute right periopercular/Sub insular infarct Continue Plavix.  Carotid Dopplers done with results as indicated below.  Continue PT/OT.  2-D echocardiogram done with results as indicated below. Neurology consultation appreciated.  Neurology recommended TEE which can be done as outpatient.  Also recommended outpatient telemetry monitoring to assess patient for atrial fibrillation as source of stroke.  This was discussed with patient and Dr. Tresa Endo, cardiology at the time of discharge.  Patient  will call office to arrange for followup.  No PT needs.  Diabetes type 2 insulin dependent Hemoglobin A1c 8.1.  Continue NovoLog 70/30, resume Humalog 75/25 at discharge.  Continue glimepiride.  Hypertension Continue metoprolol. Was not on clonidine during the hospital stay. Further titration of antihypertensive medications as outpatient.  GERD Continue omeprazole.  Chronic kidney disease stage III Renal function baseline.  Chronic diastolic heart failure Compensated.  Continue home Lasix and metolazone.  Hypokalemia Replace as needed.  Abnormal 2-D echocardiogram Echocardiogram showed dynamic obstruction across in the outflow tract, discussed with Dr. Tresa Endo, cardiology.  He recommended no further inpatient evaluation and was safe for discharge.  Consultants:  Neurology  Procedures: Carotid Dopplers on 08/07/2012 Bilateral: No evidence of hemodynamically significant internal carotid artery stenosis. Vertebral artery flow is antegrade.  2-D echocardiogram on 08/07/2012 Study Conclusions Left ventricle: The cavity size was normal. There was severe concentric hypertrophy. Systolic function was normal. The estimated ejection fraction was in the range of 60% to 65%. There was dynamic obstruction at rest in the outflow tract, with a peak velocity of 200cm/sec and a peak gradient of 16mm Hg. Wall motion was normal; there were no regional wall motion abnormalities. The outflow tract showed a velocity flow profile with aliasing and increased velocity, suggestive of obstruction. Mitral valve: Mild regurgitation. Pulmonary arteries: Systolic pressure was mildly increased. PA peak pressure: 39mm Hg (S).  Antibiotics:  None.  Discharge Exam: Filed Vitals:   08/08/12 2201 08/09/12 0142 08/09/12 0548 08/09/12 0928  BP: 120/43 107/59 125/95 138/58  Pulse: 77 68 65 72  Temp: 98.3 F (36.8 C) 98.3 F (36.8  C) 97.4 F (36.3 C) 97.4 F (36.3 C)  TempSrc: Oral Oral Oral   Resp: 18 18  18 20   Height:      Weight:      SpO2: 100% 98% 100% 100%   Discharge Instructions  Discharge Orders    Future Orders Please Complete By Expires   Diet Carb Modified      Increase activity slowly      Discharge instructions      Comments:   Please followup with Alva Garnet., MD (PCP) in 1 week.  Please followup with Dr. Pearlean Brownie, neurology in 2 months.  Please followup with Dr. Rennis Golden, cardiology for TEE and telemetry monitoring.       Medication List     As of 08/09/2012 10:23 AM    STOP taking these medications         aspirin 81 MG tablet      cloNIDine 0.2 MG tablet   Commonly known as: CATAPRES      TAKE these medications         allopurinol 300 MG tablet   Commonly known as: ZYLOPRIM   Take 150 mg by mouth daily.      clopidogrel 75 MG tablet   Commonly known as: PLAVIX   Take 1 tablet (75 mg total) by mouth daily with breakfast.      furosemide 40 MG tablet   Commonly known as: LASIX   Take 40 mg by mouth 2 (two) times daily.      glimepiride 4 MG tablet   Commonly known as: AMARYL   Take 4 mg by mouth daily before breakfast.      insulin lispro protamine-insulin lispro (75-25) 100 UNIT/ML Susp   Commonly known as: HUMALOG 75/25   Inject 6-10 Units into the skin 2 (two) times daily with a meal. And 10 units am and 6 units pm      metolazone 2.5 MG tablet   Commonly known as: ZAROXOLYN   Take 2.5 mg by mouth every other day.      metoprolol succinate 25 MG 24 hr tablet   Commonly known as: TOPROL-XL   Take 37.5 mg by mouth 2 (two) times daily.      omeprazole 20 MG capsule   Commonly known as: PRILOSEC   Take 20 mg by mouth every morning.      potassium chloride SA 20 MEQ tablet   Commonly known as: K-DUR,KLOR-CON   Take 1 tablet (20 mEq total) by mouth 2 (two) times daily.      senna-docusate 8.6-50 MG per tablet   Commonly known as: Senokot-S   Take 1 tablet by mouth at bedtime as needed for constipation.      simvastatin 10 MG  tablet   Commonly known as: ZOCOR   Take 1 tablet (10 mg total) by mouth daily at 6 PM.           Follow-up Information    Follow up with Gates Rigg, MD. Schedule an appointment as soon as possible for a visit in 2 months. (stroke clinic)    Contact information:   [address], [address] [facility] [place] [address] [phone]       Follow up with [facility]. Schedule an appointment as soon as possible for a visit in 1 week. (Transesophogeal cardiac echo and telemetry monitor.)    Contact information:   [address] [place] [address] [phone]      Follow  up with Alva Garnet., MD. Schedule an appointment as soon as possible for a visit in 1 week.   Contact information:   [address] [place] [address] [phone]           The results of significant diagnostics from this hospitalization (including imaging, microbiology, ancillary and laboratory) are listed below for reference.    Significant Diagnostic Studies: Dg Chest 2 View  08/07/2012  *RADIOLOGY REPORT*  Clinical Data: Stroke.  Hypertension.  Diabetes.  CHEST - 2 VIEW  Comparison: 04/03/2012.  Findings: Stable enlarged cardiac silhouette.  Clear lungs with normal vascularity.  Thoracic spine degenerative changes, including changes of DISH.  IMPRESSION: No acute abnormality.  Stable cardiomegaly.   Original Report Authenticated By: Beckie Salts, M.D.    Ct Head Wo Contrast  08/07/2012  *RADIOLOGY REPORT*  Clinical Data: Code stroke.  Left facial droop.  CT HEAD WITHOUT CONTRAST  Technique:  Contiguous axial images were obtained from the base of the skull through the vertex without contrast.  Comparison: None.  Findings: Mild age related volume loss. No acute intracranial abnormality.  Specifically, no hemorrhage, hydrocephalus, mass lesion, acute infarction, or significant intracranial injury.  No acute calvarial  abnormality. Visualized paranasal sinuses and mastoids clear.  Orbital soft tissues unremarkable.  IMPRESSION: Unremarkable for patient's age.  Critical Value/emergent results were called by telephone at the time of interpretation on 08/07/2012 at 12:05 p.m. to Dr. Amada Jupiter, who verbally acknowledged these results.   Original Report Authenticated By: Charlett Nose, M.D.    Mr Brain Wo Contrast  08/07/2012  *RADIOLOGY REPORT*  Clinical Data:  Left-sided facial droop.  Diabetic hypertensive patient with hyperlipidemia.  MRI BRAIN WITHOUT CONTRAST MRA HEAD WITHOUT CONTRAST  Technique: Multiplanar, multiecho pulse sequences of the brain and surrounding structures were obtained according to standard protocol without intravenous contrast.  Angiographic images of the head were obtained using MRA technique without contrast.  Comparison: 08/07/2012 head CT.  No comparison brain MR.  MRI HEAD  Findings:  Moderate sized acute right periopercular / sub insular infarct. Questionable minimal hemorrhage along the periphery of this infarct (series 6 image 12).  Global atrophy without hydrocephalus.  No intracranial mass lesion detected on this unenhanced exam.  Cervical spondylotic changes C3-4 with mild cord flattening.  Cervical medullary junction,  Cervical medullary junction, pituitary region and pineal region unremarkable.  Mild exophthalmos.  Minimal mucosal thickening ethmoid sinus air cells. Polypoid opacification in the left ethmoid sinus air cell.  IMPRESSION: Moderate sized acute right periopercular / sub insular infarct. Questionable minimal hemorrhage along the periphery of this infarct (series 6 image 12).  MRA HEAD  Findings: Motion degraded exam.  Mild narrowing and irregularity right internal carotid artery cavernous segment.  Mild ectasia of left internal carotid artery cavernous segment.  Mild to moderate focal narrowing proximal A1 segment right anterior cerebral artery.  Middle cerebral artery branch vessel  irregularity bilaterally with decreased number of visualized right middle cerebral artery branches consistent with the patient's acute infarct as detailed above.  There is mild narrowing and irregularity of the M1 segment of the right middle cerebral artery without high-grade stenosis.  Codominant vertebral arteries.  Irregular appearance of the PICA bilaterally.  Nonvisualization AICA bilaterally.  Mild narrowing proximal basilar artery.  Posterior cerebral artery and superior cerebellar artery distal branch vessel irregularity.  No aneurysm noted.  IMPRESSION: Intracranial atherosclerotic type changes as detailed above.  This has been made a PRA call report utilizing dashboard call feature.  Original Report Authenticated By: Lacy Duverney, M.D.    Mr Mra Head/brain Wo Cm  08/07/2012  *RADIOLOGY REPORT*  Clinical Data:  Left-sided facial droop.  Diabetic hypertensive patient with hyperlipidemia.  MRI BRAIN WITHOUT CONTRAST MRA HEAD WITHOUT CONTRAST  Technique: Multiplanar, multiecho pulse sequences of the brain and surrounding structures were obtained according to standard protocol without intravenous contrast.  Angiographic images of the head were obtained using MRA technique without contrast.  Comparison: 08/07/2012 head CT.  No comparison brain MR.  MRI HEAD  Findings:  Moderate sized acute right periopercular / sub insular infarct. Questionable minimal hemorrhage along the periphery of this infarct (series 6 image 12).  Global atrophy without hydrocephalus.  No intracranial mass lesion detected on this unenhanced exam.  Cervical spondylotic changes C3-4 with mild cord flattening.  Cervical medullary junction,  Cervical medullary junction, pituitary region and pineal region unremarkable.  Mild exophthalmos.  Minimal mucosal thickening ethmoid sinus air cells. Polypoid opacification in the left ethmoid sinus air cell.  IMPRESSION: Moderate sized acute right periopercular / sub insular infarct. Questionable  minimal hemorrhage along the periphery of this infarct (series 6 image 12).  MRA HEAD  Findings: Motion degraded exam.  Mild narrowing and irregularity right internal carotid artery cavernous segment.  Mild ectasia of left internal carotid artery cavernous segment.  Mild to moderate focal narrowing proximal A1 segment right anterior cerebral artery.  Middle cerebral artery branch vessel irregularity bilaterally with decreased number of visualized right middle cerebral artery branches consistent with the patient's acute infarct as detailed above.  There is mild narrowing and irregularity of the M1 segment of the right middle cerebral artery without high-grade stenosis.  Codominant vertebral arteries.  Irregular appearance of the PICA bilaterally.  Nonvisualization AICA bilaterally.  Mild narrowing proximal basilar artery.  Posterior cerebral artery and superior cerebellar artery distal branch vessel irregularity.  No aneurysm noted.  IMPRESSION: Intracranial atherosclerotic type changes as detailed above.  This has been made a PRA call report utilizing dashboard call feature.   Original Report Authenticated By: Lacy Duverney, M.D.     Microbiology: No results found for this or any previous visit (from the past 240 hour(s)).   Labs: Basic Metabolic Panel:  Lab 08/09/12 1610 08/07/12 1209 08/07/12 1200  NA 140 139 139  K 2.8* 2.8* 2.9*  CL 97 92* 92*  CO2 30 -- 34*  GLUCOSE 133* 197* 211*  BUN 36* 51* 54*  CREATININE 1.48* 1.60* 1.82*  CALCIUM 9.5 -- 9.7  MG 1.7 -- --  PHOS -- -- --   Liver Function Tests:  Lab 08/07/12 1200  AST 24  ALT 12  ALKPHOS 86  BILITOT 0.7  PROT 7.3  ALBUMIN 3.1*   No results found for this basename: LIPASE:5,AMYLASE:5 in the last 168 hours No results found for this basename: AMMONIA:5 in the last 168 hours CBC:  Lab 08/07/12 1209 08/07/12 1200  WBC -- 11.7*  NEUTROABS -- 8.6*  HGB 14.3 13.4  HCT 42.0 40.1  MCV -- 91.1  PLT -- 214   Cardiac  Enzymes:  Lab 08/07/12 1200  CKTOTAL --  CKMB --  CKMBINDEX --  TROPONINI <0.30   BNP: BNP (last 3 results)  Basename 04/09/12 0600 04/06/12 0420 04/05/12 0530  PROBNP 2461.0* 2500.0* 2750.0*   CBG:  Lab 08/09/12 0646 08/08/12 2204 08/08/12 1715 08/08/12 1116 08/08/12 0646  GLUCAP 129* 119* 140* 190* 170*       Signed:  Vylette Strubel A  [facility] 08/09/2012, 10:23 AM

## 2012-08-09 NOTE — Progress Notes (Signed)
[facility] PROGRESS NOTE  [name] ZOX:[id number] DOB: [birth year] DOA: 08/07/2012 PCP: Alva Garnet., MD  Assessment/Plan: Moderate sized acute right periopercular/Sub insular infarct Continue Plavix.  Carotid Dopplers done with results as indicated below.  Continue PT/OT.  2-D echocardiogram done with results as indicated below. Neurology consultation appreciated.  Neurology recommended TEE which can be done as outpatient.  Also recommended outpatient telemetry monitoring to assess patient for atrial fibrillation as source of stroke.  This was discussed with patient and Dr. Tresa Endo, cardiology at the time of discharge.  Patient will call office to arrange for followup.  No PT needs.  Diabetes type 2 insulin dependent Hemoglobin A1c 8.1.  Continue NovoLog 70/30, resume Humalog 75/25 at discharge.  Continue glimepiride.  Hypertension Continue metoprolol and clonidine.  GERD Continue omeprazole.  Chronic kidney disease stage III Renal function baseline.  Chronic diastolic heart failure Compensated.  Continue home Lasix and metolazone.  Hypokalemia Replace as needed.  Abnormal 2-D echocardiogram Echocardiogram showed dynamic obstruction across in the outflow tract, discussed with Dr. Tresa Endo, cardiology.  He recommended no further inpatient evaluation and was safe for discharge.  Prophylaxis Lovenox  Code Status: Presumed full code. Family Communication: No family at bedside.  Disposition Plan: DC home.  Consultants:  Neurology  Procedures: Carotid Dopplers on 08/07/2012 Bilateral: No evidence of hemodynamically significant internal carotid artery stenosis. Vertebral artery flow is antegrade.  2-D echocardiogram on 08/07/2012 Study Conclusions Left ventricle: The cavity size was normal. There was severe concentric hypertrophy. Systolic function was normal. The estimated ejection fraction was in the range of 60% to 65%. There was dynamic obstruction at rest in  the outflow tract, with a peak velocity of 200cm/sec and a peak gradient of 16mm Hg. Wall motion was normal; there were no regional wall motion abnormalities. The outflow tract showed a velocity flow profile with aliasing and increased velocity, suggestive of obstruction. Mitral valve: Mild regurgitation. Pulmonary arteries: Systolic pressure was mildly increased. PA peak pressure: 39mm Hg (S).  Antibiotics:  None.  HPI/Subjective: No specific concerns.  Feeling better.  Objective: Filed Vitals:   08/08/12 2201 08/09/12 0142 08/09/12 0548 08/09/12 0928  BP: 120/43 107/59 125/95 138/58  Pulse: 77 68 65 72  Temp: 98.3 F (36.8 C) 98.3 F (36.8 C) 97.4 F (36.3 C) 97.4 F (36.3 C)  TempSrc: Oral Oral Oral   Resp: 18 18 18 20   Height:      Weight:      SpO2: 100% 98% 100% 100%    Intake/Output Summary (Last 24 hours) at 08/09/12 1015 Last data filed at 08/09/12 0819  Gross per 24 hour  Intake    300 ml  Output    450 ml  Net   -150 ml   Filed Weights   08/07/12 2028  Weight: 102 kg (224 lb 13.9 oz)    Exam: Physical Exam: General: Awake, Oriented, No acute distress. HEENT: EOMI. Neck: Supple CV: S1 and S2 Lungs: Clear to ascultation bilaterally Abdomen: Soft, Nontender, Nondistended, +bowel sounds. Ext: Good pulses. Trace edema.  Data Reviewed: Basic Metabolic Panel:  Lab 08/09/12 8119 08/07/12 1209 08/07/12 1200  NA 140 139 139  K 2.8* 2.8* 2.9*  CL 97 92* 92*  CO2 30 -- 34*  GLUCOSE 133* 197* 211*  BUN 36* 51* 54*  CREATININE 1.48* 1.60* 1.82*  CALCIUM 9.5 -- 9.7  MG 1.7 -- --  PHOS -- -- --   Liver Function Tests:  Lab 08/07/12 1200  AST 24  ALT 12  ALKPHOS 86  BILITOT 0.7  PROT 7.3  ALBUMIN 3.1*   No results found for this basename: LIPASE:5,AMYLASE:5 in the last 168 hours No results found for this basename: AMMONIA:5 in the last 168 hours CBC:  Lab 08/07/12 1209 08/07/12 1200  WBC -- 11.7*  NEUTROABS -- 8.6*  HGB 14.3 13.4  HCT 42.0  40.1  MCV -- 91.1  PLT -- 214   Cardiac Enzymes:  Lab 08/07/12 1200  CKTOTAL --  CKMB --  CKMBINDEX --  TROPONINI <0.30   BNP (last 3 results)  Basename 04/09/12 0600 04/06/12 0420 04/05/12 0530  PROBNP 2461.0* 2500.0* 2750.0*   CBG:  Lab 08/09/12 0646 08/08/12 2204 08/08/12 1715 08/08/12 1116 08/08/12 0646  GLUCAP 129* 119* 140* 190* 170*    No results found for this or any previous visit (from the past 240 hour(s)).   Studies: Dg Chest 2 View  08/07/2012  *RADIOLOGY REPORT*  Clinical Data: Stroke.  Hypertension.  Diabetes.  CHEST - 2 VIEW  Comparison: 04/03/2012.  Findings: Stable enlarged cardiac silhouette.  Clear lungs with normal vascularity.  Thoracic spine degenerative changes, including changes of DISH.  IMPRESSION: No acute abnormality.  Stable cardiomegaly.   Original Report Authenticated By: Beckie Salts, M.D.    Ct Head Wo Contrast  08/07/2012  *RADIOLOGY REPORT*  Clinical Data: Code stroke.  Left facial droop.  CT HEAD WITHOUT CONTRAST  Technique:  Contiguous axial images were obtained from the base of the skull through the vertex without contrast.  Comparison: None.  Findings: Mild age related volume loss. No acute intracranial abnormality.  Specifically, no hemorrhage, hydrocephalus, mass lesion, acute infarction, or significant intracranial injury.  No acute calvarial abnormality. Visualized paranasal sinuses and mastoids clear.  Orbital soft tissues unremarkable.  IMPRESSION: Unremarkable for patient's age.  Critical Value/emergent results were called by telephone at the time of interpretation on 08/07/2012 at 12:05 p.m. to Dr. Amada Jupiter, who verbally acknowledged these results.   Original Report Authenticated By: Charlett Nose, M.D.    Mr Brain Wo Contrast  08/07/2012  *RADIOLOGY REPORT*  Clinical Data:  Left-sided facial droop.  Diabetic hypertensive patient with hyperlipidemia.  MRI BRAIN WITHOUT CONTRAST MRA HEAD WITHOUT CONTRAST  Technique: Multiplanar,  multiecho pulse sequences of the brain and surrounding structures were obtained according to standard protocol without intravenous contrast.  Angiographic images of the head were obtained using MRA technique without contrast.  Comparison: 08/07/2012 head CT.  No comparison brain MR.  MRI HEAD  Findings:  Moderate sized acute right periopercular / sub insular infarct. Questionable minimal hemorrhage along the periphery of this infarct (series 6 image 12).  Global atrophy without hydrocephalus.  No intracranial mass lesion detected on this unenhanced exam.  Cervical spondylotic changes C3-4 with mild cord flattening.  Cervical medullary junction,  Cervical medullary junction, pituitary region and pineal region unremarkable.  Mild exophthalmos.  Minimal mucosal thickening ethmoid sinus air cells. Polypoid opacification in the left ethmoid sinus air cell.  IMPRESSION: Moderate sized acute right periopercular / sub insular infarct. Questionable minimal hemorrhage along the periphery of this infarct (series 6 image 12).  MRA HEAD  Findings: Motion degraded exam.  Mild narrowing and irregularity right internal carotid artery cavernous segment.  Mild ectasia of left internal carotid artery cavernous segment.  Mild to moderate focal narrowing proximal A1 segment right anterior cerebral artery.  Middle cerebral artery branch vessel irregularity bilaterally with decreased number of visualized right middle cerebral artery branches consistent with the patient's acute infarct as detailed above.  There is mild narrowing and irregularity of the M1 segment of the right middle cerebral artery without high-grade stenosis.  Codominant vertebral arteries.  Irregular appearance of the PICA bilaterally.  Nonvisualization AICA bilaterally.  Mild narrowing proximal basilar artery.  Posterior cerebral artery and superior cerebellar artery distal branch vessel irregularity.  No aneurysm noted.  IMPRESSION: Intracranial atherosclerotic type  changes as detailed above.  This has been made a PRA call report utilizing dashboard call feature.   Original Report Authenticated By: Lacy Duverney, M.D.    Mr Mra Head/brain Wo Cm  08/07/2012  *RADIOLOGY REPORT*  Clinical Data:  Left-sided facial droop.  Diabetic hypertensive patient with hyperlipidemia.  MRI BRAIN WITHOUT CONTRAST MRA HEAD WITHOUT CONTRAST  Technique: Multiplanar, multiecho pulse sequences of the brain and surrounding structures were obtained according to standard protocol without intravenous contrast.  Angiographic images of the head were obtained using MRA technique without contrast.  Comparison: 08/07/2012 head CT.  No comparison brain MR.  MRI HEAD  Findings:  Moderate sized acute right periopercular / sub insular infarct. Questionable minimal hemorrhage along the periphery of this infarct (series 6 image 12).  Global atrophy without hydrocephalus.  No intracranial mass lesion detected on this unenhanced exam.  Cervical spondylotic changes C3-4 with mild cord flattening.  Cervical medullary junction,  Cervical medullary junction, pituitary region and pineal region unremarkable.  Mild exophthalmos.  Minimal mucosal thickening ethmoid sinus air cells. Polypoid opacification in the left ethmoid sinus air cell.  IMPRESSION: Moderate sized acute right periopercular / sub insular infarct. Questionable minimal hemorrhage along the periphery of this infarct (series 6 image 12).  MRA HEAD  Findings: Motion degraded exam.  Mild narrowing and irregularity right internal carotid artery cavernous segment.  Mild ectasia of left internal carotid artery cavernous segment.  Mild to moderate focal narrowing proximal A1 segment right anterior cerebral artery.  Middle cerebral artery branch vessel irregularity bilaterally with decreased number of visualized right middle cerebral artery branches consistent with the patient's acute infarct as detailed above.  There is mild narrowing and irregularity of the M1  segment of the right middle cerebral artery without high-grade stenosis.  Codominant vertebral arteries.  Irregular appearance of the PICA bilaterally.  Nonvisualization AICA bilaterally.  Mild narrowing proximal basilar artery.  Posterior cerebral artery and superior cerebellar artery distal branch vessel irregularity.  No aneurysm noted.  IMPRESSION: Intracranial atherosclerotic type changes as detailed above.  This has been made a PRA call report utilizing dashboard call feature.   Original Report Authenticated By: Lacy Duverney, M.D.     Scheduled Meds:    . clopidogrel  75 mg Oral Q breakfast  . enoxaparin (LOVENOX) injection  40 mg Subcutaneous Q24H  . furosemide  40 mg Oral BID  . glimepiride  4 mg Oral QAC breakfast  . insulin aspart  0-5 Units Subcutaneous QHS  . insulin aspart  0-9 Units Subcutaneous TID WC  . insulin aspart protamine-insulin aspart  10 Units Subcutaneous BID WC  . metolazone  2.5 mg Oral QODAY  . metoprolol succinate  37.5 mg Oral BID  . pantoprazole  40 mg Oral Q1200  . potassium chloride SA  20 mEq Oral BID  . simvastatin  10 mg Oral q1800   Continuous Infusions:   Principal Problem:  *CVA (cerebrovascular accident) Active Problems:  Pulmonary hypertension  Sleep apnea on c-pap   Acute on chronic diastolic CHF (congestive heart failure), NYHA class 3  Chronic venous insufficiency with LE ulcers, will be followed at wound  center  Diabetes mellitus type 2 in obese  Diastolic dysfunction stage 2 with an EF of 55% 2D 2012  Hypertrophic obstructive cardiomyopathy  CKD (chronic kidney disease) stage 3, GFR 30-59 ml/min    Tinya Cadogan A, MD  [facility] Pager [phone]. If 7PM-7AM, please contact night-coverage at [email or web address], password [facility] 08/09/2012, 10:15 AM  LOS: 2 days

## 2012-08-09 NOTE — Progress Notes (Addendum)
Patient and family given discharge instructions.  Patient and patient's niece denies any questions or concerns. Written  discharge instructions given.  IV access removed,  Site clean, dry and intact no redness, swelling, bruising.  Pt to be discharge home with private vechicle.  Pt to be discharged per wheelchair.

## 2012-08-11 NOTE — Care Management Note (Signed)
    Page 1 of 1   08/11/2012     11:23:30 AM   CARE MANAGEMENT NOTE 08/11/2012  Patient:  [name], [name]   Account Number:  [id number]  Date Initiated:  08/11/2012  Documentation initiated by:  [facility]  Subjective/Objective Assessment:   admitted with cva     Action/Plan:   PT/OT evals-no PT follow up needed, no equipment needs   Anticipated DC Date:     Anticipated DC Plan:        DC Planning Services  CM consult      Choice offered to / List presented to:             Status of service:  Completed, signed off Medicare Important Message given?   (If response is "NO", the following Medicare IM given date fields will be blank) Date Medicare IM given:   Date Additional Medicare IM given:    Discharge Disposition:  HOME/SELF CARE  Per UR Regulation:  Reviewed for med. necessity/level of care/duration of stay  If discussed at Long Length of Stay Meetings, dates discussed:    Comments:

## 2012-08-12 ENCOUNTER — Encounter (HOSPITAL_BASED_OUTPATIENT_CLINIC_OR_DEPARTMENT_OTHER): Payer: Medicare Other

## 2012-09-22 ENCOUNTER — Encounter (HOSPITAL_BASED_OUTPATIENT_CLINIC_OR_DEPARTMENT_OTHER): Payer: Medicare Other | Attending: Plastic Surgery

## 2012-09-22 DIAGNOSIS — L97809 Non-pressure chronic ulcer of other part of unspecified lower leg with unspecified severity: Secondary | ICD-10-CM | POA: Insufficient documentation

## 2012-09-25 ENCOUNTER — Inpatient Hospital Stay (HOSPITAL_COMMUNITY)
Admission: EM | Admit: 2012-09-25 | Discharge: 2012-09-30 | DRG: 292 | Disposition: A | Discharge status: Home Health | Payer: Medicare Other | Attending: Internal Medicine | Admitting: Internal Medicine

## 2012-09-25 ENCOUNTER — Inpatient Hospital Stay: Admission: AD | Admit: 2012-09-25 | Payer: Medicare Other | Source: Ambulatory Visit | Admitting: Internal Medicine

## 2012-09-25 ENCOUNTER — Encounter (HOSPITAL_COMMUNITY): Payer: Self-pay | Admitting: Emergency Medicine

## 2012-09-25 DIAGNOSIS — N183 Chronic kidney disease, stage 3 unspecified: Secondary | ICD-10-CM | POA: Diagnosis present

## 2012-09-25 DIAGNOSIS — E1169 Type 2 diabetes mellitus with other specified complication: Secondary | ICD-10-CM | POA: Diagnosis present

## 2012-09-25 DIAGNOSIS — K219 Gastro-esophageal reflux disease without esophagitis: Secondary | ICD-10-CM | POA: Diagnosis present

## 2012-09-25 DIAGNOSIS — Z8249 Family history of ischemic heart disease and other diseases of the circulatory system: Secondary | ICD-10-CM

## 2012-09-25 DIAGNOSIS — E785 Hyperlipidemia, unspecified: Secondary | ICD-10-CM | POA: Diagnosis present

## 2012-09-25 DIAGNOSIS — I2789 Other specified pulmonary heart diseases: Secondary | ICD-10-CM | POA: Diagnosis present

## 2012-09-25 DIAGNOSIS — I5033 Acute on chronic diastolic (congestive) heart failure: Principal | ICD-10-CM | POA: Diagnosis present

## 2012-09-25 DIAGNOSIS — I739 Peripheral vascular disease, unspecified: Secondary | ICD-10-CM | POA: Diagnosis present

## 2012-09-25 DIAGNOSIS — G4733 Obstructive sleep apnea (adult) (pediatric): Secondary | ICD-10-CM | POA: Diagnosis present

## 2012-09-25 DIAGNOSIS — I509 Heart failure, unspecified: Secondary | ICD-10-CM | POA: Diagnosis present

## 2012-09-25 DIAGNOSIS — I351 Nonrheumatic aortic (valve) insufficiency: Secondary | ICD-10-CM | POA: Diagnosis present

## 2012-09-25 DIAGNOSIS — Z833 Family history of diabetes mellitus: Secondary | ICD-10-CM

## 2012-09-25 DIAGNOSIS — Z794 Long term (current) use of insulin: Secondary | ICD-10-CM

## 2012-09-25 DIAGNOSIS — I359 Nonrheumatic aortic valve disorder, unspecified: Secondary | ICD-10-CM | POA: Diagnosis present

## 2012-09-25 DIAGNOSIS — I480 Paroxysmal atrial fibrillation: Secondary | ICD-10-CM | POA: Diagnosis present

## 2012-09-25 DIAGNOSIS — I129 Hypertensive chronic kidney disease with stage 1 through stage 4 chronic kidney disease, or unspecified chronic kidney disease: Secondary | ICD-10-CM | POA: Diagnosis present

## 2012-09-25 DIAGNOSIS — I272 Pulmonary hypertension, unspecified: Secondary | ICD-10-CM | POA: Diagnosis present

## 2012-09-25 DIAGNOSIS — Z79899 Other long term (current) drug therapy: Secondary | ICD-10-CM

## 2012-09-25 DIAGNOSIS — Z7901 Long term (current) use of anticoagulants: Secondary | ICD-10-CM

## 2012-09-25 DIAGNOSIS — I872 Venous insufficiency (chronic) (peripheral): Secondary | ICD-10-CM | POA: Diagnosis present

## 2012-09-25 DIAGNOSIS — G4730 Sleep apnea, unspecified: Secondary | ICD-10-CM | POA: Diagnosis present

## 2012-09-25 DIAGNOSIS — Z6835 Body mass index (BMI) 35.0-35.9, adult: Secondary | ICD-10-CM

## 2012-09-25 DIAGNOSIS — I421 Obstructive hypertrophic cardiomyopathy: Secondary | ICD-10-CM | POA: Diagnosis present

## 2012-09-25 DIAGNOSIS — Z8673 Personal history of transient ischemic attack (TIA), and cerebral infarction without residual deficits: Secondary | ICD-10-CM

## 2012-09-25 DIAGNOSIS — E669 Obesity, unspecified: Secondary | ICD-10-CM | POA: Diagnosis present

## 2012-09-25 DIAGNOSIS — Z86718 Personal history of other venous thrombosis and embolism: Secondary | ICD-10-CM

## 2012-09-25 DIAGNOSIS — Z96659 Presence of unspecified artificial knee joint: Secondary | ICD-10-CM

## 2012-09-25 DIAGNOSIS — E119 Type 2 diabetes mellitus without complications: Secondary | ICD-10-CM | POA: Diagnosis present

## 2012-09-25 DIAGNOSIS — I4891 Unspecified atrial fibrillation: Secondary | ICD-10-CM | POA: Diagnosis present

## 2012-09-25 DIAGNOSIS — M129 Arthropathy, unspecified: Secondary | ICD-10-CM | POA: Diagnosis present

## 2012-09-25 DIAGNOSIS — I639 Cerebral infarction, unspecified: Secondary | ICD-10-CM | POA: Diagnosis present

## 2012-09-25 DIAGNOSIS — I5189 Other ill-defined heart diseases: Secondary | ICD-10-CM | POA: Diagnosis present

## 2012-09-25 DIAGNOSIS — I10 Essential (primary) hypertension: Secondary | ICD-10-CM | POA: Diagnosis present

## 2012-09-25 HISTORY — DX: Cerebral infarction, unspecified: I63.9

## 2012-09-25 HISTORY — DX: Paroxysmal atrial fibrillation: I48.0

## 2012-09-25 LAB — GLUCOSE, CAPILLARY: Glucose-Capillary: 143 mg/dL — ABNORMAL HIGH (ref 70–99)

## 2012-09-25 LAB — CBC
Hemoglobin: 13.4 g/dL (ref 12.0–15.0)
MCH: 30.9 pg (ref 26.0–34.0)
MCHC: 32.9 g/dL (ref 30.0–36.0)
Platelets: 255 10*3/uL (ref 150–400)
RDW: 15 % (ref 11.5–15.5)

## 2012-09-25 LAB — HEPATIC FUNCTION PANEL
AST: 23 U/L (ref 0–37)
Bilirubin, Direct: 0.2 mg/dL (ref 0.0–0.3)
Indirect Bilirubin: 0.5 mg/dL (ref 0.3–0.9)
Total Bilirubin: 0.7 mg/dL (ref 0.3–1.2)

## 2012-09-25 LAB — PROTIME-INR
INR: 2.26 — ABNORMAL HIGH (ref 0.00–1.49)
Prothrombin Time: 24 seconds — ABNORMAL HIGH (ref 11.6–15.2)

## 2012-09-25 LAB — TROPONIN I: Troponin I: <0.3 ng/mL (ref <0.30)

## 2012-09-25 LAB — BASIC METABOLIC PANEL
Calcium: 9.4 mg/dL (ref 8.4–10.5)
GFR calc Af Amer: 35 mL/min — ABNORMAL LOW (ref >90)
GFR calc non Af Amer: 30 mL/min — ABNORMAL LOW (ref >90)
Glucose, Bld: 71 mg/dL (ref 70–99)
Potassium: 3.6 mEq/L (ref 3.5–5.1)
Sodium: 142 mEq/L (ref 135–145)

## 2012-09-25 MED ORDER — SODIUM CHLORIDE 0.9 % IV SOLN: 250.0000 mL | INTRAVENOUS | Status: DC | PRN

## 2012-09-25 MED ORDER — ACETAMINOPHEN 325 MG PO TABS
650.0000 mg | ORAL_TABLET | ORAL | Status: DC | PRN
  Administered 2012-09-30: 650 mg via ORAL
  Filled 2012-09-25: qty 2

## 2012-09-25 MED ORDER — WARFARIN SODIUM 2.5 MG PO TABS
2.5000 mg | ORAL_TABLET | ORAL | Status: DC
  Filled 2012-09-25: qty 1

## 2012-09-25 MED ORDER — ZOLPIDEM TARTRATE 5 MG PO TABS
5.0000 mg | ORAL_TABLET | Freq: Every evening | ORAL | Status: DC | PRN
  Administered 2012-09-30: 5 mg via ORAL
  Filled 2012-09-25: qty 1

## 2012-09-25 MED ORDER — ALPRAZOLAM 0.25 MG PO TABS: 0.2500 mg | ORAL_TABLET | Freq: Two times a day (BID) | ORAL | Status: DC | PRN

## 2012-09-25 MED ORDER — SODIUM CHLORIDE 0.9 % IJ SOLN
3.0000 mL | Freq: Two times a day (BID) | INTRAMUSCULAR | Status: DC
  Administered 2012-09-25 – 2012-09-30 (×10): 3 mL via INTRAVENOUS
  Filled 2012-09-25: qty 3

## 2012-09-25 MED ORDER — POTASSIUM CHLORIDE CRYS ER 20 MEQ PO TBCR
20.0000 meq | EXTENDED_RELEASE_TABLET | Freq: Two times a day (BID) | ORAL | Status: DC
  Administered 2012-09-25 – 2012-09-30 (×11): 20 meq via ORAL
  Filled 2012-09-25 (×13): qty 1

## 2012-09-25 MED ORDER — ALLOPURINOL 150 MG HALF TABLET
150.0000 mg | ORAL_TABLET | Freq: Every day | ORAL | Status: DC
  Administered 2012-09-26 – 2012-09-30 (×5): 150 mg via ORAL
  Filled 2012-09-25 (×5): qty 1

## 2012-09-25 MED ORDER — ONDANSETRON HCL 4 MG/2ML IJ SOLN: 4.0000 mg | Freq: Four times a day (QID) | INTRAMUSCULAR | Status: DC | PRN

## 2012-09-25 MED ORDER — SODIUM CHLORIDE 0.9 % IJ SOLN: 3.0000 mL | INTRAMUSCULAR | Status: DC | PRN

## 2012-09-25 MED ORDER — INSULIN ASPART 100 UNIT/ML ~~LOC~~ SOLN
0.0000 [IU] | Freq: Three times a day (TID) | SUBCUTANEOUS | Status: DC
  Administered 2012-09-26 – 2012-09-27 (×3): 1 [IU] via SUBCUTANEOUS
  Administered 2012-09-27: 2 [IU] via SUBCUTANEOUS
  Administered 2012-09-28: 18:00:00 via SUBCUTANEOUS
  Administered 2012-09-28 (×2): 1 [IU] via SUBCUTANEOUS
  Administered 2012-09-29 (×2): 2 [IU] via SUBCUTANEOUS
  Administered 2012-09-30: 1 [IU] via SUBCUTANEOUS

## 2012-09-25 MED ORDER — FUROSEMIDE 10 MG/ML IJ SOLN
80.0000 mg | Freq: Two times a day (BID) | INTRAMUSCULAR | Status: DC
  Administered 2012-09-25: 80 mg via INTRAVENOUS
  Filled 2012-09-25: qty 8

## 2012-09-25 MED ORDER — WARFARIN 1.25 MG HALF TABLET
1.2500 mg | ORAL_TABLET | ORAL | Status: AC
  Administered 2012-09-26: 1.25 mg via ORAL
  Filled 2012-09-25: qty 1

## 2012-09-25 MED ORDER — FUROSEMIDE 10 MG/ML IJ SOLN
80.0000 mg | Freq: Two times a day (BID) | INTRAMUSCULAR | Status: DC
  Administered 2012-09-26 – 2012-09-29 (×7): 80 mg via INTRAVENOUS
  Filled 2012-09-25 (×10): qty 8

## 2012-09-25 MED ORDER — GLIMEPIRIDE 4 MG PO TABS
4.0000 mg | ORAL_TABLET | Freq: Every day | ORAL | Status: DC
  Administered 2012-09-26 – 2012-09-30 (×5): 4 mg via ORAL
  Filled 2012-09-25 (×7): qty 1

## 2012-09-25 MED ORDER — SENNOSIDES-DOCUSATE SODIUM 8.6-50 MG PO TABS
1.0000 | ORAL_TABLET | Freq: Every evening | ORAL | Status: DC | PRN
  Filled 2012-09-25: qty 1

## 2012-09-25 MED ORDER — WARFARIN SODIUM 2.5 MG PO TABS
2.5000 mg | ORAL_TABLET | Freq: Once | ORAL | Status: AC
  Filled 2012-09-25: qty 1

## 2012-09-25 MED ORDER — PANTOPRAZOLE SODIUM 40 MG PO TBEC
40.0000 mg | DELAYED_RELEASE_TABLET | Freq: Every day | ORAL | Status: DC
  Administered 2012-09-25 – 2012-09-30 (×6): 40 mg via ORAL
  Filled 2012-09-25 (×6): qty 1

## 2012-09-25 MED ORDER — METOPROLOL SUCCINATE ER 25 MG PO TB24
37.5000 mg | ORAL_TABLET | Freq: Two times a day (BID) | ORAL | Status: DC
  Administered 2012-09-26 – 2012-09-30 (×10): 37.5 mg via ORAL
  Filled 2012-09-25 (×12): qty 1

## 2012-09-25 MED ORDER — SIMVASTATIN 10 MG PO TABS
10.0000 mg | ORAL_TABLET | Freq: Every day | ORAL | Status: DC
  Administered 2012-09-26 – 2012-09-29 (×5): 10 mg via ORAL
  Filled 2012-09-25 (×7): qty 1

## 2012-09-25 MED ORDER — INSULIN ASPART PROT & ASPART (70-30 MIX) 100 UNIT/ML ~~LOC~~ SUSP
3.0000 [IU] | Freq: Every day | SUBCUTANEOUS | Status: DC
  Administered 2012-09-25 – 2012-09-29 (×5): 3 [IU] via SUBCUTANEOUS
  Filled 2012-09-25: qty 10

## 2012-09-25 MED ORDER — WARFARIN - PHARMACIST DOSING INPATIENT
Freq: Every day | Status: DC
  Administered 2012-09-27 – 2012-09-28 (×2)

## 2012-09-25 MED ORDER — ASPIRIN EC 81 MG PO TBEC
81.0000 mg | DELAYED_RELEASE_TABLET | Freq: Every day | ORAL | Status: DC
  Administered 2012-09-26 – 2012-09-30 (×3): 81 mg via ORAL
  Filled 2012-09-25 (×5): qty 1

## 2012-09-25 NOTE — ED Notes (Signed)
CBG 102 

## 2012-09-25 NOTE — ED Provider Notes (Signed)
History     CSN: [id number]  Arrival date & time 09/25/12  1323   First MD Initiated Contact with Patient 09/25/12 1504      Chief Complaint  Patient presents with  . Shortness of Breath    (Consider location/radiation/quality/duration/timing/severity/associated sxs/prior treatment) Patient is a 79 y.o. female presenting with shortness of breath.  Shortness of Breath Severity:  Severe Onset quality:  Gradual Timing:  Constant Progression:  Worsening Chronicity:  Recurrent Context: activity   Context comment:  Hx of CHF Relieved by:  Sitting up Worsened by:  Activity (lying down) Ineffective treatments:  Diuretics Associated symptoms: no chest pain, no cough and no fever     Past Medical History  Diagnosis Date  . Hypertension   . Venous stasis of lower extremity WEARS TEDS    BLE--  CURRENTLY NO ULCERS/ WOUNDS  . Aortic insufficiency     mild/H&P 04/02/2012  . Type II diabetes mellitus   . OSA on CPAP   . Pulmonary hypertension per last echo 03-28-2012  mod.-severe     per dr wert note this related to L ht pressures from diastolic chf  . Peripheral vascular disease   . Hypertrophic obstructive cardiomyopathy   . Arthritis   . CKD (chronic kidney disease) stage 3, GFR 30-59 ml/min   . Heart murmur   . History of DVT of lower extremity lower right leg nov 2012  . Hyperlipidemia   . History of gout 06-09-2012  per pt stable  . Chronic diastolic CHF (congestive heart failure), NYHA class 3 CARDIOLOGIST- DR HILTY--  LOV OCT 2013--  REQUESTED NOTE, EGK AND STRESS TEST    PER PT ON 06-09-2012  S&S OF CHF AND WT IS DOWN  . H/O hiatal hernia   . GERD (gastroesophageal reflux disease)   . Pain of right thumb TMC  -- OSTEOARTHRITIS  . Lower extremity edema   . PAF (paroxysmal atrial fibrillation), in SR on admit 09/25/2012    Past Surgical History  Procedure Laterality Date  . Total knee arthroplasty  1988; 1998 X2    right; bilateral  . Tumor excision  1970's     "off my stomach"  . Cataract extraction w/ intraocular lens implant  ~ 2010    right; "put lens in; didn't work; had mass on my eye"  . Transthoracic echocardiogram  04-03-2012   DR KENNETH HILTY    LVSF  EF 80-85%/ HYPERTRONIC CARDIOMYOPATHY WITH MOSTLY MID-CAVITARY OBSTRUCTION AND A SMALLER COMPONENT OF LV OUTFLOW TRACT OBSTRUCTION/   EVIDENCE OF SEVERE DECOMPRESATION OF DIASTOLIC HEART FAILURE AND MODERATE TO SEVERE PULMONARY ARTERIAL HYPERTENSION  . Joint replacement      TOTAL RIGHT KNEE X2  1988  &  1998 (DUE TO JOINT MALFUNCTION)   TOTAL LEFT KNEE 1998  . Carpometacarpel suspension plasty  06/16/2012    [facility]: [facility] SUSPENSION PLASTY;  Surgeon: Jodi Marble, MD;  Location: [facility];  Service: Orthopedics;  Laterality: Right;  RIGHT THUMB SUSPENSION ARTHROPLASTY.    Family History  Problem Relation Age of Onset  . Other Mother   . Coronary artery disease Father     sudden death in his 51's  . Diabetes Father   . Cancer Father   . Diabetes Sister     History  Substance Use Topics  . Smoking status: Never Smoker   . Smokeless tobacco: Never Used  . Alcohol Use: No    OB History   Grav Para Term Preterm Abortions TAB  SAB Ect Mult Living                  Review of Systems  Constitutional: Negative for fever.  Respiratory: Positive for shortness of breath. Negative for cough.   Cardiovascular: Negative for chest pain.  All other systems reviewed and are negative.    Allergies  Review of patient's allergies indicates no known allergies.  Home Medications   Current Outpatient Rx  Name  Route  Sig  Dispense  Refill  . allopurinol (ZYLOPRIM) 300 MG tablet   Oral   Take 150 mg by mouth daily.         . furosemide (LASIX) 40 MG tablet   Oral   Take 40 mg by mouth 2 (two) times daily.          Marland Kitchen glimepiride (AMARYL) 4 MG tablet   Oral   Take 4 mg by mouth daily before breakfast.          . insulin lispro  protamine-insulin lispro (HUMALOG 75/25) (75-25) 100 UNIT/ML SUSP   Subcutaneous   Inject 6-10 Units into the skin 2 (two) times daily with a meal. And 10 units am and 6 units pm         . metolazone (ZAROXOLYN) 2.5 MG tablet   Oral   Take 2.5 mg by mouth every other day.         . metoprolol succinate (TOPROL-XL) 25 MG 24 hr tablet   Oral   Take 37.5 mg by mouth 2 (two) times daily.         Marland Kitchen omeprazole (PRILOSEC) 20 MG capsule   Oral   Take 20 mg by mouth every morning.          . potassium chloride SA (K-DUR,KLOR-CON) 20 MEQ tablet   Oral   Take 1 tablet (20 mEq total) by mouth 2 (two) times daily.   60 tablet   0   . senna-docusate (SENOKOT-S) 8.6-50 MG per tablet   Oral   Take 1 tablet by mouth at bedtime as needed for constipation.   30 tablet   0   . simvastatin (ZOCOR) 10 MG tablet   Oral   Take 1 tablet (10 mg total) by mouth daily at 6 PM.   30 tablet   0     BP 153/71  Pulse 73  Temp(Src) 97.5 F (36.4 C) (Oral)  Resp 24  SpO2 100%  Physical Exam  Nursing note and vitals reviewed. Constitutional: She is oriented to person, place, and time. She appears well-developed and well-nourished. No distress.  HENT:  Head: Normocephalic and atraumatic.  Mouth/Throat: Oropharynx is clear and moist.  Eyes: Conjunctivae are normal. Pupils are equal, round, and reactive to light. No scleral icterus.  Neck: Neck supple.  Cardiovascular: Normal rate, regular rhythm and intact distal pulses.  Exam reveals distant heart sounds.   Pulmonary/Chest: Effort normal. No stridor. No respiratory distress. She has decreased breath sounds (bases). She has no rales.  Abdominal: Soft. Bowel sounds are normal. She exhibits no distension. There is no tenderness.  Musculoskeletal: Normal range of motion. She exhibits edema (compression wraps in place).  Neurological: She is alert and oriented to person, place, and time.  Skin: Skin is warm and dry. No rash noted.   Psychiatric: She has a normal mood and affect. Her behavior is normal.    ED Course  Procedures (including critical care time)  EKG: normal EKG, normal sinus rhythm, unchanged from previous tracings, normal sinus  rhythm, nonspecific ST and T waves changes, prolonged QT interval.  Labs Reviewed  CBC - Abnormal; Notable for the following:    WBC 11.7 (*)    All other components within normal limits  BASIC METABOLIC PANEL - Abnormal; Notable for the following:    BUN 29 (*)    Creatinine, Ser 1.59 (*)    GFR calc non Af Amer 30 (*)    GFR calc Af Amer 35 (*)    All other components within normal limits  PRO B NATRIURETIC PEPTIDE - Abnormal; Notable for the following:    Pro B Natriuretic peptide (BNP) 12391.0 (*)    All other components within normal limits  PROTIME-INR - Abnormal; Notable for the following:    Prothrombin Time 24.0 (*)    INR 2.26 (*)    All other components within normal limits  HEPATIC FUNCTION PANEL - Abnormal; Notable for the following:    Albumin 3.1 (*)    All other components within normal limits  POCT I-STAT TROPONIN I - Abnormal; Notable for the following:    Troponin i, poc 0.22 (*)    All other components within normal limits  MAGNESIUM  TROPONIN I  TROPONIN I  TROPONIN I  TSH  BASIC METABOLIC PANEL   No results found.   1. Acute on chronic diastolic CHF (congestive heart failure), NYHA class 3, 09/25/12   2. Shortness of breath       MDM  3:22 PM 79 yo female with hx of CHF presenting from Cardiology clinic secondary to worsening shortness of breath and fluid retention.  No distress on initial exam.  Cardiology plans to admit.  Will administer lasix in ED.    7:12 PM Still awaiting inpt bed.    admitted  Rennis Petty, MD 09/26/12 [phone]

## 2012-09-25 NOTE — H&P (Signed)
[facility]            ADMISSION HISTORY & PHYSICAL   Chief Complaint:  Shortness of breath, orthopnea, weight gain, weeping edema  Cardiologist: Dr. Rennis Golden  Primary Care Physician: Alva Garnet., MD  HPI: 79 year old female who I had seen in January for regular followup. Unfortunately, that morning she presented, she had suffered a moderate-sized acute right opercular stroke. I sent her from the office directly to the hospital. Unfortunately, it was after the time when she would benefit from tPA. The effects of the stroke, however, were minimal. Subsequently, she was found to have atrial fibrillation on a CardioNet monitor and was started on Coumadin, with a therapeutic INR today checked in the office; it was 2.6. She was seen by one of our nurse practitioners on September 05, 2012, and was noted to have worsening shortness of breath and weight gain as well as lower extremity edema. She recommended increasing her metolazone for several days and then going back to her regular diuretic doses. Recently, over the past several weeks, she has had increasing weight gain, and her weight now is up to 242 from 234 about 1 month ago. She started to have marked lower extremity edema, including weeping lower extremity edema, and was seen at a wound care center, during which her legs were wrapped, just this week. She also has had worsening orthopnea with dfficulty and almost near inability to lie flat at night, and shortness of breath with minimal exertion.  PMHx:  Past Medical History  Diagnosis Date  . Hypertension   . Venous stasis of lower extremity WEARS TEDS    BLE--  CURRENTLY NO ULCERS/ WOUNDS  . Aortic insufficiency     mild/H&P 04/02/2012  . Type II diabetes mellitus   . OSA on CPAP   . Pulmonary hypertension per last echo 03-28-2012  mod.-severe     per dr wert note this related to L ht pressures from diastolic chf  . Peripheral vascular disease   .  Hypertrophic obstructive cardiomyopathy   . Arthritis   . CKD (chronic kidney disease) stage 3, GFR 30-59 ml/min   . Heart murmur   . History of DVT of lower extremity lower right leg nov 2012  . Hyperlipidemia   . History of gout 06-09-2012  per pt stable  . Chronic diastolic CHF (congestive heart failure), NYHA class 3 CARDIOLOGIST- DR HILTY--  LOV OCT 2013--  REQUESTED NOTE, EGK AND STRESS TEST    PER PT ON 06-09-2012  S&S OF CHF AND WT IS DOWN  . H/O hiatal hernia   . GERD (gastroesophageal reflux disease)   . Pain of right thumb [facility]  -- OSTEOARTHRITIS  . Lower extremity edema   . PAF (paroxysmal atrial fibrillation), in SR on admit 09/25/2012    Past Surgical History  Procedure Laterality Date  . Total knee arthroplasty  1988; 1998 X2    right; bilateral  . Tumor excision  1970's    "off my stomach"  . Cataract extraction w/ intraocular lens implant  ~ 2010    right; "put lens in; didn't work; had mass on my eye"  . Transthoracic echocardiogram  04-03-2012   DR KENNETH HILTY    LVSF  EF 80-85%/ HYPERTRONIC CARDIOMYOPATHY WITH MOSTLY MID-CAVITARY OBSTRUCTION AND A SMALLER COMPONENT OF LV OUTFLOW TRACT OBSTRUCTION/   EVIDENCE OF SEVERE DECOMPRESATION OF DIASTOLIC HEART FAILURE AND MODERATE TO SEVERE PULMONARY ARTERIAL HYPERTENSION  . Joint replacement  TOTAL RIGHT KNEE X2  1988  &  1998 (DUE TO JOINT MALFUNCTION)   TOTAL LEFT KNEE 1998  . Carpometacarpel suspension plasty  06/16/2012    Procedure: CARPOMETACARPEL [facility]) SUSPENSION PLASTY;  Surgeon: Jodi Marble, MD;  Location: [facility];  Service: Orthopedics;  Laterality: Right;  RIGHT THUMB SUSPENSION ARTHROPLASTY.    FAMHx:  Family History  Problem Relation Age of Onset  . Other Mother   . Coronary artery disease Father     sudden death in his 50's  . Diabetes Father   . Cancer Father   . Diabetes Sister     SOCHx:   reports that she has never smoked. She has never used smokeless tobacco. She  reports that she does not drink alcohol or use illicit drugs.  ALLERGIES:  No Known Allergies  ROS: A comprehensive review of systems was negative except for: Respiratory: positive for dyspnea on exertion Cardiovascular: positive for dyspnea, orthopnea and paroxysmal nocturnal dyspnea Integument/breast: positive for weeping LE edema  HOME MEDS: Medications Prior to Admission  Medication Sig Dispense Refill  . insulin lispro protamine-insulin lispro (HUMALOG 75/25) (75-25) 100 UNIT/ML SUSP Inject 5-10 Units into the skin 2 (two) times daily with a meal. 10 units with breakfast and 5 units at dinner      . metolazone (ZAROXOLYN) 2.5 MG tablet Take 2.5 mg by mouth every Monday, Wednesday, and Friday.      . metoprolol succinate (TOPROL-XL) 25 MG 24 hr tablet Take 12.5 mg by mouth 2 (two) times daily.      Marland Kitchen senna-docusate (SENOKOT-S) 8.6-50 MG per tablet Take 1 tablet by mouth at bedtime as needed for constipation.  30 tablet  0  . warfarin (COUMADIN) 2.5 MG tablet Take 1.25-2.5 mg by mouth daily. 2.5mg /day except take 1.25mg  on MF      . allopurinol (ZYLOPRIM) 300 MG tablet Take 150 mg by mouth daily.      . furosemide (LASIX) 40 MG tablet Take 40 mg by mouth 2 (two) times daily.       Marland Kitchen glimepiride (AMARYL) 4 MG tablet Take 4 mg by mouth daily before breakfast.       . omeprazole (PRILOSEC) 20 MG capsule Take 20 mg by mouth every morning.       . potassium chloride SA (K-DUR,KLOR-CON) 20 MEQ tablet Take 1 tablet (20 mEq total) by mouth 2 (two) times daily.  60 tablet  0  . simvastatin (ZOCOR) 10 MG tablet Take 1 tablet (10 mg total) by mouth daily at 6 PM.  30 tablet  0    LABS/IMAGING: Results for orders placed during the hospital encounter of 09/25/12 (from the past 48 hour(s))  CBC     Status: Abnormal   Collection Time    09/25/12  3:30 PM      Result Value Range   WBC 11.7 (*) 4.0 - 10.5 K/uL   RBC 4.34  3.87 - 5.11 MIL/uL   Hemoglobin 13.4  12.0 - 15.0 g/dL   HCT 96.0  45.4 -  09.8 %   MCV 93.8  78.0 - 100.0 fL   MCH 30.9  26.0 - 34.0 pg   MCHC 32.9  30.0 - 36.0 g/dL   RDW 11.9  14.7 - 82.9 %   Platelets 255  150 - 400 K/uL  BASIC METABOLIC PANEL     Status: Abnormal   Collection Time    09/25/12  3:30 PM      Result Value Range  Sodium 142  135 - 145 mEq/L   Potassium 3.6  3.5 - 5.1 mEq/L   Chloride 102  96 - 112 mEq/L   CO2 29  19 - 32 mEq/L   Glucose, Bld 71  70 - 99 mg/dL   BUN 29 (*) 6 - 23 mg/dL   Creatinine, Ser 1.61 (*) 0.50 - 1.10 mg/dL   Calcium 9.4  8.4 - 09.6 mg/dL   GFR calc non Af Amer 30 (*) >90 mL/min   GFR calc Af Amer 35 (*) >90 mL/min   Comment:            The eGFR has been calculated     using the CKD EPI equation.     This calculation has not been     validated in all clinical     situations.     eGFR's persistently     <90 mL/min signify     possible Chronic Kidney Disease.  PRO B NATRIURETIC PEPTIDE     Status: Abnormal   Collection Time    09/25/12  3:30 PM      Result Value Range   Pro B Natriuretic peptide (BNP) 12391.0 (*) 0 - 450 pg/mL  PROTIME-INR     Status: Abnormal   Collection Time    09/25/12  3:30 PM      Result Value Range   Prothrombin Time 24.0 (*) 11.6 - 15.2 seconds   INR 2.26 (*) 0.00 - 1.49  HEPATIC FUNCTION PANEL     Status: Abnormal   Collection Time    09/25/12  3:30 PM      Result Value Range   Total Protein 7.2  6.0 - 8.3 g/dL   Albumin 3.1 (*) 3.5 - 5.2 g/dL   AST 23  0 - 37 U/L   ALT 15  0 - 35 U/L   Alkaline Phosphatase 74  39 - 117 U/L   Total Bilirubin 0.7  0.3 - 1.2 mg/dL   Bilirubin, Direct 0.2  0.0 - 0.3 mg/dL   Indirect Bilirubin 0.5  0.3 - 0.9 mg/dL  MAGNESIUM     Status: None   Collection Time    09/25/12  3:30 PM      Result Value Range   Magnesium 1.6  1.5 - 2.5 mg/dL  TROPONIN I     Status: None   Collection Time    09/25/12  3:30 PM      Result Value Range   Troponin I <0.30  <0.30 ng/mL   Comment:            Due to the release kinetics of cTnI,     a negative  result within the first hours     of the onset of symptoms does not rule out     myocardial infarction with certainty.     If myocardial infarction is still suspected,     repeat the test at appropriate intervals.  TSH     Status: None   Collection Time    09/25/12  3:30 PM      Result Value Range   TSH 0.992  0.350 - 4.500 uIU/mL  POCT I-STAT TROPONIN I     Status: Abnormal   Collection Time    09/25/12  3:38 PM      Result Value Range   Troponin i, poc 0.22 (*) 0.00 - 0.08 ng/mL   Comment NOTIFIED PHYSICIAN     Comment 3  Comment: Due to the release kinetics of cTnI,     a negative result within the first hours     of the onset of symptoms does not rule out     myocardial infarction with certainty.     If myocardial infarction is still suspected,     repeat the test at appropriate intervals.  GLUCOSE, CAPILLARY     Status: Abnormal   Collection Time    09/25/12  6:54 PM      Result Value Range   Glucose-Capillary 67 (*) 70 - 99 mg/dL  GLUCOSE, CAPILLARY     Status: Abnormal   Collection Time    09/25/12  7:23 PM      Result Value Range   Glucose-Capillary 102 (*) 70 - 99 mg/dL  TROPONIN I     Status: None   Collection Time    09/25/12  8:24 PM      Result Value Range   Troponin I <0.30  <0.30 ng/mL   Comment:            Due to the release kinetics of cTnI,     a negative result within the first hours     of the onset of symptoms does not rule out     myocardial infarction with certainty.     If myocardial infarction is still suspected,     repeat the test at appropriate intervals.  GLUCOSE, CAPILLARY     Status: Abnormal   Collection Time    09/25/12  8:46 PM      Result Value Range   Glucose-Capillary 143 (*) 70 - 99 mg/dL   Comment 1 Notify RN    TROPONIN I     Status: None   Collection Time    09/26/12  1:55 AM      Result Value Range   Troponin I <0.30  <0.30 ng/mL   Comment:            Due to the release kinetics of cTnI,     a negative  result within the first hours     of the onset of symptoms does not rule out     myocardial infarction with certainty.     If myocardial infarction is still suspected,     repeat the test at appropriate intervals.  BASIC METABOLIC PANEL     Status: Abnormal   Collection Time    09/26/12  1:55 AM      Result Value Range   Sodium 141  135 - 145 mEq/L   Potassium 3.8  3.5 - 5.1 mEq/L   Chloride 100  96 - 112 mEq/L   CO2 30  19 - 32 mEq/L   Glucose, Bld 150 (*) 70 - 99 mg/dL   BUN 28 (*) 6 - 23 mg/dL   Creatinine, Ser 1.61 (*) 0.50 - 1.10 mg/dL   Calcium 9.4  8.4 - 09.6 mg/dL   GFR calc non Af Amer 31 (*) >90 mL/min   GFR calc Af Amer 36 (*) >90 mL/min   Comment:            The eGFR has been calculated     using the CKD EPI equation.     This calculation has not been     validated in all clinical     situations.     eGFR's persistently     <90 mL/min signify     possible Chronic Kidney Disease.  PROTIME-INR     Status:  Abnormal   Collection Time    09/26/12  1:55 AM      Result Value Range   Prothrombin Time 24.7 (*) 11.6 - 15.2 seconds   INR 2.35 (*) 0.00 - 1.49  GLUCOSE, CAPILLARY     Status: Abnormal   Collection Time    09/26/12  3:54 AM      Result Value Range   Glucose-Capillary 129 (*) 70 - 99 mg/dL  GLUCOSE, CAPILLARY     Status: Abnormal   Collection Time    09/26/12  7:41 AM      Result Value Range   Glucose-Capillary 143 (*) 70 - 99 mg/dL   Comment 1 Notify RN    GLUCOSE, CAPILLARY     Status: Abnormal   Collection Time    09/26/12 11:49 AM      Result Value Range   Glucose-Capillary 121 (*) 70 - 99 mg/dL   Comment 1 Notify RN     Dg Chest Port 1 View  09/26/2012  *RADIOLOGY REPORT*  Clinical Data: CHF evaluation.  PORTABLE CHEST - 1 VIEW  Comparison: 08/07/2012  Findings: Enlargement of the central vascular structures.  Heart size is upper limits of normal but unchanged.  Trachea is midline. No focal airspace disease.  No evidence for a pneumothorax.   IMPRESSION: Enlarged central vascular structures suggest vascular congestion or mild edema.   Original Report Authenticated By: Richarda Overlie, M.D.     Theodoro KosCeasar Mons Vitals:   09/26/12 0549  BP: 115/76  Pulse: 72  Temp: 98.1 F (36.7 C)  Resp: 20    EXAM: General appearance: alert and mild distress Neck: no adenopathy, no carotid bruit, supple, symmetrical, trachea midline, thyroid not enlarged, symmetric, no tenderness/mass/nodules and JVP to earlobe Lungs: diminished breath sounds bibasilar and rales bibasilar Heart: regular rate and rhythm, S1, S2 normal, no murmur, click, rub or gallop Abdomen: morbidly obese, soft, non-tender Extremities: venous stasis dermatitis noted and 3+ bilateral weeping LE edema Pulses: 1+ pulses Skin: Skin color, texture, turgor normal. No rashes or lesions Neurologic: Mental status: Alert, oriented, thought content appropriate, mild left eye ptosis and slight droop to the corner of the left lips  IMPRESSION: 1. Principal Problem: 2.   Acute on chronic diastolic CHF (congestive heart failure), NYHA class 3, 09/25/12 3. Active Problems: 4.   Pulmonary hypertension 5.   Sleep apnea on c-pap  6.   Aortic insufficiency: mild 7.   Diabetes mellitus type 2 in obese 8.   Diastolic dysfunction stage 2 with an EF of 55% 2D 2012 9.   HTN (hypertension) 10.   Hypertrophic obstructive cardiomyopathy 11.   CKD (chronic kidney disease) stage 3, GFR 30-59 ml/min 12.   PAF (paroxysmal atrial fibrillation), in SR on admit 13.   PLAN: The patient is in decompensated heart failure today with significant signs and symptoms of progressive heart failure. This is despite recent changes in her diuretics. She reports no dietary indiscretions; however, there is the suspicion for salt intake and abuse. I do not feel at this point that we will have success in diuresing her effectively due to her marked weight gain with outpatient diuretics alone. I feel that she would benefit  from admission for IV diuresis and medication adjustments. After discussing this with her, we will plan to send her to the hospital through the emergency room as no beds are available for a direct admission, and she is agreeable to this plan.  Chrystie Nose, MD, [facility] Attending Cardiologist [facility]

## 2012-09-25 NOTE — Progress Notes (Signed)
ANTICOAGULATION CONSULT NOTE - Initial Consult  Pharmacy Consult for coumadin Indication: atrial fibrillation  No Known Allergies  Patient Measurements: Wt= 102kg  Vital Signs: Temp: 97.5 F (36.4 C) (03/13 1333) Temp src: Oral (03/13 1333) BP: 153/71 mmHg (03/13 1333) Pulse Rate: 73 (03/13 1333)  Labs: No results found for this basename: HGB, HCT, PLT, APTT, LABPROT, INR, HEPARINUNFRC, CREATININE, CKTOTAL, CKMB, TROPONINI,  in the last 72 hours  The CrCl is unknown because both a height and weight (above a minimum accepted value) are required for this calculation.   Medical History: Past Medical History  Diagnosis Date  . Hypertension   . Venous stasis of lower extremity WEARS TEDS    BLE--  CURRENTLY NO ULCERS/ WOUNDS  . Aortic insufficiency     mild/H&P 04/02/2012  . Type II diabetes mellitus   . OSA on CPAP   . Pulmonary hypertension per last echo 03-28-2012  mod.-severe     per dr wert note this related to L ht pressures from diastolic chf  . Peripheral vascular disease   . Hypertrophic obstructive cardiomyopathy   . Arthritis   . CKD (chronic kidney disease) stage 3, GFR 30-59 ml/min   . Heart murmur   . History of DVT of lower extremity lower right leg nov 2012  . Hyperlipidemia   . History of gout 06-09-2012  per pt stable  . Chronic diastolic CHF (congestive heart failure), NYHA class 3 CARDIOLOGIST- DR HILTY--  LOV OCT 2013--  REQUESTED NOTE, EGK AND STRESS TEST    PER PT ON 06-09-2012  S&S OF CHF AND WT IS DOWN  . H/O hiatal hernia   . GERD (gastroesophageal reflux disease)   . Pain of right thumb [facility]  -- OSTEOARTHRITIS  . Lower extremity edema   . PAF (paroxysmal atrial fibrillation), in SR on admit 09/25/2012    Assessment: 79 yo female here with HF and on coumadin PTA for afib (started three weeks ago per patient). INR today is at goal (2.26) but will need to watch trend due to HF.  Coumadin home dose: 2.5mg /day except for MF patient takes 1.25mg .  Patient's last dose was 09/24/12.  Goal of Therapy:  INR 2-3 Monitor platelets by anticoagulation protocol: Yes   Plan:  -Continue coumadin as taken at home -Daily PT/INR  Harland German, Pharm D 09/25/2012 3:50 PM

## 2012-09-25 NOTE — ED Notes (Signed)
Vernona Rieger cardiology pa paged regarding pt's troponin

## 2012-09-25 NOTE — ED Notes (Signed)
Pt reports increasing SOB over the last several days. Pt reports increased swelling generalized but primarily to lower extremities. States has been taking medications as prescribed. Hx of CHF. Denies chest pain at present.

## 2012-09-25 NOTE — ED Notes (Signed)
Heart healthy diet ordered. 

## 2012-09-26 ENCOUNTER — Inpatient Hospital Stay (HOSPITAL_COMMUNITY): Payer: Medicare Other

## 2012-09-26 ENCOUNTER — Encounter (HOSPITAL_COMMUNITY): Payer: Self-pay | Admitting: General Practice

## 2012-09-26 LAB — BASIC METABOLIC PANEL
BUN: 28 mg/dL — ABNORMAL HIGH (ref 6–23)
CO2: 30 mEq/L (ref 19–32)
Chloride: 100 mEq/L (ref 96–112)
Creatinine, Ser: 1.57 mg/dL — ABNORMAL HIGH (ref 0.50–1.10)
Potassium: 3.8 mEq/L (ref 3.5–5.1)

## 2012-09-26 LAB — GLUCOSE, CAPILLARY
Glucose-Capillary: 129 mg/dL — ABNORMAL HIGH (ref 70–99)
Glucose-Capillary: 143 mg/dL — ABNORMAL HIGH (ref 70–99)
Glucose-Capillary: 123 mg/dL — ABNORMAL HIGH (ref 70–99)

## 2012-09-26 LAB — PROTIME-INR
INR: 2.35 — ABNORMAL HIGH (ref 0.00–1.49)
Prothrombin Time: 24.7 seconds — ABNORMAL HIGH (ref 11.6–15.2)

## 2012-09-26 NOTE — ED Provider Notes (Signed)
I saw and evaluated the patient, reviewed the resident's note and I agree with the findings and plan. Pt w hx chf, c/o worsening sob. Basilar rales. Lasix. Labs. Cxr. Admit.   Suzi Roots, MD 09/26/12 [phone]

## 2012-09-26 NOTE — Progress Notes (Signed)
ANTICOAGULATION CONSULT NOTE - Follow-Up Consult  Pharmacy Consult for coumadin Indication: atrial fibrillation  No Known Allergies  Patient Measurements: Wt= 102kg  Labs:  Recent Labs  09/25/12 1530 09/25/12 2024 09/26/12 0155  HGB 13.4  --   --   HCT 40.7  --   --   PLT 255  --   --   LABPROT 24.0*  --  24.7*  INR 2.26*  --  2.35*  CREATININE 1.59*  --  1.57*  TROPONINI <0.30 <0.30 <0.30    Estimated Creatinine Clearance: 37 ml/min (by C-G formula based on Cr of 1.57).   Assessment: [name] here with HF and on coumadin PTA for afib (started three weeks ago per patient). INR today is at goal (2.35) .  Coumadin home dose: 2.5mg /day except for [name] patient takes 1.25mg .  Goal of Therapy:  INR 2-3 Monitor platelets by anticoagulation protocol: Yes   Plan:  -Continue coumadin as taken at home -Daily PT/INR  Thank you. Okey Regal, PharmD [phone]   09/26/2012 2:08 PM

## 2012-09-26 NOTE — Progress Notes (Signed)
Patient evaluated for long-term disease management services with [facility] Care Management Program as a benefit of her [employer] insurance. Explained [facility] Care Management services at bedside. Consents signed at bedside. Patient will receive a post discharge transition of care call and evaluated for monthly home visits for assessments and for education. Reports she lives with her husband. Also states she has had [facility] in the past. Explained to patient how [facility] Care Management will not replace home health. Will make inpatient RNCM aware of visit.   Arvella Merles, RN,BSN, [facility], [phone]

## 2012-09-26 NOTE — Progress Notes (Signed)
[facility]  Subjective: Breathing is improving. She was able to sleep better last night.  Objective: Vital signs in last 24 hours: Temp:  [97.4 F (36.3 C)-98.1 F (36.7 C)] 98.1 F (36.7 C) (03/14 0549) Pulse Rate:  [72-85] 72 (03/14 0549) Resp:  [20-24] 20 (03/14 0549) BP: (101-153)/(64-76) 115/76 mmHg (03/14 0549) SpO2:  [96 %-100 %] 96 % (03/14 0549) Weight:  [227 lb 4.7 oz (103.1 kg)-229 lb (103.874 kg)] 227 lb 4.7 oz (103.1 kg) (03/14 0549) Last BM Date: 09/24/12  Intake/Output from previous day: 03/13 0701 - 03/14 0700 In: 420 [P.O.:420] Out: 1225 [Urine:1225] Intake/Output this shift: Total I/O In: 360 [P.O.:360] Out: 150 [Urine:150]  Medications Current Facility-Administered Medications  Medication Dose Route Frequency Provider Last Rate Last Dose  . 0.9 %  sodium chloride infusion  250 mL Intravenous PRN Nada Boozer, NP      . acetaminophen (TYLENOL) tablet 650 mg  650 mg Oral Q4H PRN Nada Boozer, NP      . allopurinol (ZYLOPRIM) tablet 150 mg  150 mg Oral Daily Nada Boozer, NP      . ALPRAZolam Prudy Feeler) tablet 0.25 mg  0.25 mg Oral BID PRN Nada Boozer, NP      . aspirin EC tablet 81 mg  81 mg Oral Daily Nada Boozer, NP      . furosemide (LASIX) injection 80 mg  80 mg Intravenous BID Chrystie Nose, MD   80 mg at 09/26/12 4098  . glimepiride (AMARYL) tablet 4 mg  4 mg Oral QAC breakfast Nada Boozer, NP      . insulin aspart (novoLOG) injection 0-9 Units  0-9 Units Subcutaneous TID WC Nada Boozer, NP   1 Units at 09/26/12 [phone]  . insulin aspart protamine-insulin aspart (NOVOLOG 70/30) injection 3 Units  3 Units Subcutaneous Q supper Nada Boozer, NP   3 Units at 09/25/12 2233  . metoprolol succinate (TOPROL-XL) 24 hr tablet 37.5 mg  37.5 mg Oral BID Nada Boozer, NP   37.5 mg at 09/26/12 0002  . ondansetron (ZOFRAN) injection 4 mg  4 mg Intravenous Q6H PRN Nada Boozer, NP      . pantoprazole (PROTONIX) EC tablet 40 mg  40 mg  Oral Daily Nada Boozer, NP   40 mg at 09/25/12 1855  . potassium chloride SA (K-DUR,KLOR-CON) CR tablet 20 mEq  20 mEq Oral BID Nada Boozer, NP   20 mEq at 09/26/12 0002  . senna-docusate (Senokot-S) tablet 1 tablet  1 tablet Oral QHS PRN Nada Boozer, NP      . simvastatin (ZOCOR) tablet 10 mg  10 mg Oral q1800 Nada Boozer, NP   10 mg at 09/26/12 0002  . sodium chloride 0.9 % injection 3 mL  3 mL Intravenous Q12H Nada Boozer, NP   3 mL at 09/25/12 2239  . sodium chloride 0.9 % injection 3 mL  3 mL Intravenous PRN Nada Boozer, NP      . warfarin (COUMADIN) tablet 1.25 mg  1.25 mg Oral Custom Benny Lennert, [facility]      . [START ON 09/27/2012] warfarin (COUMADIN) tablet 2.5 mg  2.5 mg Oral Custom Benny Lennert, [facility]      . Warfarin - Pharmacist Dosing Inpatient   Does not apply q1800 Benny Lennert, [facility]      . zolpidem [facility]) tablet 5 mg  5 mg Oral QHS PRN Nada Boozer, NP        PE: General appearance: alert, cooperative  and no distress Lungs: clear to auscultation bilaterally Heart: regular rate and rhythm Extremities: 2+ LEE, compression stockings bilaterally Pulses: 2+ radials, DP could not be assessed due to edema Skin: warm and dry Neurologic: Grossly normal  Lab Results:   Recent Labs  09/25/12 1530  WBC 11.7*  HGB 13.4  HCT 40.7  PLT 255   BMET  Recent Labs  09/25/12 1530 09/26/12 0155  NA 142 141  K 3.6 3.8  CL 102 100  CO2 29 30  GLUCOSE 71 150*  BUN 29* 28*  CREATININE 1.59* 1.57*  CALCIUM 9.4 9.4   PT/INR  Recent Labs  09/25/12 1530 09/26/12 0155  LABPROT 24.0* 24.7*  INR 2.26* 2.35*   Studies/Results: CXR 3/14 RADIOLOGY REPORT*  Clinical Data: CHF evaluation.  PORTABLE CHEST - 1 VIEW  Comparison: 08/07/2012  Findings: Enlargement of the central vascular structures. Heart  size is upper limits of normal but unchanged. Trachea is midline.  No focal airspace disease. No evidence for a pneumothorax.  IMPRESSION:  Enlarged central  vascular structures suggest vascular congestion or  mild edema.  Original Report Authenticated By: Richarda Overlie, M.D.  Assessment/Plan  Principal Problem:   Acute on chronic diastolic CHF (congestive heart failure), NYHA class 3, 09/25/12 Active Problems:   Pulmonary hypertension   Sleep apnea on c-pap    Aortic insufficiency: mild   Diabetes mellitus type 2 in obese   Diastolic dysfunction stage 2 with an EF of 55% 2D 2012   HTN (hypertension)   Hypertrophic obstructive cardiomyopathy   CKD (chronic kidney disease) stage 3, GFR 30-59 ml/min   PAF (paroxysmal atrial fibrillation), in SR on admit  Plan: Pt with diastolic HF, A-fib (on warfarin), Pulmonary HTN, DM and OSA, admitted directly from [facility] by, Dr. Rennis Golden, yesterday for A/C diastolic HF.  BNP yesterday was 12,391. CXR from this AM shows enlarged central vascular structures suggesting vascular congestion or  mild edema. She was started on 80 mg of IV Lasix BID. She diuresed 1.2 L in past 24 hours. Her weight is down at 227 lbs today (241.9 lb in office yesterday). Her Cr is stable at 1.57. K+ is WNL. Continue with IV Lasix. Continue with strict I/Os. Will watch renal function. Will recheck BNP in 1-2 days. Maintaining NSR on telemetry. No a-fib noted. HR in the 70's. INR is therapeutic.  BP is stable. Will continue to monitor.     LOS: 1 day    Theresa M. Sharol Harness, PA-C 09/26/2012 9:40 AM  I have seen and examined the patient along with Theresa M. Sharol Harness, PA-C.  I have reviewed the chart, notes and new data.  I agree with PA's note.  Key new complaints: no longer orthopneic, not walking much Key examination changes: still with impressive pedal edema; no rales; no S3. JVP hard to evaluate. Weight has decreased by 2 lbs, commensurate with roughly 1L negative fluid balance. Note big discrepancy between office and hospital scales. She was about 8 lbs heavier from a month ago when she was seen in the office. She has a rough systolic  ejection murmur in the aortic focus. Key new findings / data: no change in renal function parameters  PLAN: Still needs diuresis, roughly 6lb of fluid to go. Baseline proBNP seems tto be less than 2500 (>12000 on admission). Safer to aggressive diurese in-hospital due to concomitant CKD stage III and LV outflow obstruction by echo.  She is compliant with medications, sodium restriction and monitors weight daily. Cause of decompensation is  unclear.  Theresa Fair, MD, [facility] [phone] 09/26/2012, 3:21 PM

## 2012-09-26 NOTE — Progress Notes (Signed)
Utilization review completed.  

## 2012-09-27 LAB — PROTIME-INR: Prothrombin Time: 26.3 seconds — ABNORMAL HIGH (ref 11.6–15.2)

## 2012-09-27 LAB — BASIC METABOLIC PANEL
Chloride: 99 mEq/L (ref 96–112)
GFR calc Af Amer: 36 mL/min — ABNORMAL LOW (ref >90)
Potassium: 3.6 mEq/L (ref 3.5–5.1)
Sodium: 142 mEq/L (ref 135–145)

## 2012-09-27 LAB — GLUCOSE, CAPILLARY
Glucose-Capillary: 150 mg/dL — ABNORMAL HIGH (ref 70–99)
Glucose-Capillary: 151 mg/dL — ABNORMAL HIGH (ref 70–99)

## 2012-09-27 MED ORDER — WARFARIN 1.25 MG HALF TABLET
1.2500 mg | ORAL_TABLET | Freq: Once | ORAL | Status: AC
  Administered 2012-09-27: 1.25 mg via ORAL
  Filled 2012-09-27: qty 1

## 2012-09-27 NOTE — Progress Notes (Addendum)
ANTICOAGULATION CONSULT NOTE - Follow-Up Consult  Pharmacy Consult for coumadin Indication: atrial fibrillation  No Known Allergies  Patient Measurements: Wt= 102kg  Labs:  Recent Labs  09/25/12 1530 09/25/12 2024 09/26/12 0155 09/27/12 0535  HGB 13.4  --   --   --   HCT 40.7  --   --   --   PLT 255  --   --   --   LABPROT 24.0*  --  24.7* 26.3*  INR 2.26*  --  2.35* 2.56*  CREATININE 1.59*  --  1.57* 1.57*  TROPONINI <0.30 <0.30 <0.30  --     Estimated Creatinine Clearance: 37 ml/min (by C-G formula based on Cr of 1.57).   Assessment: 79 yo female here with HF and on coumadin PTA for afib (started three weeks ago per patient). INR today is at goal (2.56) . Continues to rise slightly.   No bleeding noted.  Last cbc stable.    Coumadin home dose: 2.5mg /day except for MF patient takes 1.25mg .  Goal of Therapy:  INR 2-3 Monitor platelets by anticoagulation protocol: Yes   Plan:  Coumadin 1.25mg  x1 today -Daily PT/INR  Leota Sauers Pharm.D. CPP, BCPS Clinical Pharmacist [phone] 09/27/2012 12:46 PM

## 2012-09-27 NOTE — Progress Notes (Signed)
Nutrition Brief Note  Patient identified on the Malnutrition Screening Tool (MST) Report  Pt reports that she weighed 260 lb 1 year ago before she started to take lasix. Pt has continued to lose water weight. Pt had a stroke in Feb and had a decreased appetite so she started eating a lot of soup likely contributing to her fluid gain. Pt has good appetite presently. We did review low sodium guidelines, reinforcement provided.   Body mass index is 36.72 kg/(m^2). Patient meets criteria for Obesity Class II based on current BMI.   Current diet order is Heart Healthy, patient is consuming approximately 100% of meals at this time. Labs and medications reviewed.   No nutrition interventions warranted at this time. If nutrition issues arise, please consult RD.   Kendell Bane RD, LDN, CNSC [phone] Pager [phone] After Hours Pager

## 2012-09-27 NOTE — Progress Notes (Signed)
[facility]  DAILY PROGRESS NOTE   Subjective:  No events noted overnight. Very little urine output was recorded. Weight appears to be unchanged from yesterday. Creatinine appears stable.  Objective:  Temp:  [97.5 F (36.4 C)-97.9 F (36.6 C)] 97.9 F (36.6 C) (03/15 0525) Pulse Rate:  [73-77] 76 (03/15 0525) Resp:  [19] 19 (03/14 1400) BP: (110-133)/(54-72) 133/54 mmHg (03/15 0525) SpO2:  [97 %-100 %] 98 % (03/15 0525) Weight:  [103.148 kg (227 lb 6.4 oz)] 103.148 kg (227 lb 6.4 oz) (03/15 0525) Weight change: -0.726 kg (-1 lb 9.6 oz)  Intake/Output from previous day: 03/14 0701 - 03/15 0700 In: 1040 [P.O.:1040] Out: 1300 [Urine:1300]  Intake/Output from this shift:    Medications: Current Facility-Administered Medications  Medication Dose Route Frequency Provider Last Rate Last Dose  . 0.9 %  sodium chloride infusion  250 mL Intravenous PRN Nada Boozer, NP      . acetaminophen (TYLENOL) tablet 650 mg  650 mg Oral Q4H PRN Nada Boozer, NP      . allopurinol (ZYLOPRIM) tablet 150 mg  150 mg Oral Daily Nada Boozer, NP   150 mg at 09/26/12 1040  . ALPRAZolam Prudy Feeler) tablet 0.25 mg  0.25 mg Oral BID PRN Nada Boozer, NP      . aspirin EC tablet 81 mg  81 mg Oral Daily Nada Boozer, NP   81 mg at 09/26/12 1040  . furosemide (LASIX) injection 80 mg  80 mg Intravenous BID Chrystie Nose, MD   80 mg at 09/26/12 1749  . glimepiride (AMARYL) tablet 4 mg  4 mg Oral QAC breakfast Nada Boozer, NP   4 mg at 09/26/12 1040  . insulin aspart (novoLOG) injection 0-9 Units  0-9 Units Subcutaneous TID WC Nada Boozer, NP   1 Units at 09/26/12 1748  . insulin aspart protamine-insulin aspart (NOVOLOG 70/30) injection 3 Units  3 Units Subcutaneous Q supper Nada Boozer, NP   3 Units at 09/26/12 1749  . metoprolol succinate (TOPROL-XL) 24 hr tablet 37.5 mg  37.5 mg Oral BID Nada Boozer, NP   37.5 mg at 09/26/12 2118  . ondansetron (ZOFRAN) injection 4 mg  4 mg  Intravenous Q6H PRN Nada Boozer, NP      . pantoprazole (PROTONIX) EC tablet 40 mg  40 mg Oral Daily Nada Boozer, NP   40 mg at 09/26/12 1041  . potassium chloride SA (K-DUR,KLOR-CON) CR tablet 20 mEq  20 mEq Oral BID Nada Boozer, NP   20 mEq at 09/26/12 2118  . senna-docusate (Senokot-S) tablet 1 tablet  1 tablet Oral QHS PRN Nada Boozer, NP      . simvastatin (ZOCOR) tablet 10 mg  10 mg Oral q1800 Nada Boozer, NP   10 mg at 09/26/12 1748  . sodium chloride 0.9 % injection 3 mL  3 mL Intravenous Q12H Nada Boozer, NP   3 mL at 09/26/12 2120  . sodium chloride 0.9 % injection 3 mL  3 mL Intravenous PRN Nada Boozer, NP      . warfarin (COUMADIN) tablet 2.5 mg  2.5 mg Oral Custom Benny Lennert, [facility]      . Warfarin - Pharmacist Dosing Inpatient   Does not apply q1800 Benny Lennert, [facility]      . zolpidem [facility]) tablet 5 mg  5 mg Oral QHS PRN Nada Boozer, NP        Physical Exam: General appearance: alert and no distress Neck: JVD - 2  cm above sternal notch, no adenopathy, no carotid bruit, supple, symmetrical, trachea midline and thyroid not enlarged, symmetric, no tenderness/mass/nodules Lungs: clear to auscultation bilaterally Heart: regular rate and rhythm and systolic murmur: early systolic 2/6, crescendo at 2nd right intercostal space Abdomen: soft, non-tender; bowel sounds normal; no masses,  no organomegaly Extremities: both legs wrapped, but appear less edematous Pulses: not assessed due to wrapped legs distally Skin: Skin color, texture, turgor normal. No rashes or lesions Neurologic: Grossly normal  Lab Results: Results for orders placed during the hospital encounter of 09/25/12 (from the past 48 hour(s))  CBC     Status: Abnormal   Collection Time    09/25/12  3:30 PM      Result Value Range   WBC 11.7 (*) 4.0 - 10.5 K/uL   RBC 4.34  3.87 - 5.11 MIL/uL   Hemoglobin 13.4  12.0 - 15.0 g/dL   HCT 09.8  11.9 - 14.7 %   MCV 93.8  78.0 - 100.0 fL   MCH 30.9  26.0 -  34.0 pg   MCHC 32.9  30.0 - 36.0 g/dL   RDW 82.9  56.2 - 13.0 %   Platelets 255  150 - 400 K/uL  BASIC METABOLIC PANEL     Status: Abnormal   Collection Time    09/25/12  3:30 PM      Result Value Range   Sodium 142  135 - 145 mEq/L   Potassium 3.6  3.5 - 5.1 mEq/L   Chloride 102  96 - 112 mEq/L   CO2 29  19 - 32 mEq/L   Glucose, Bld 71  70 - 99 mg/dL   BUN 29 (*) 6 - 23 mg/dL   Creatinine, Ser 8.65 (*) 0.50 - 1.10 mg/dL   Calcium 9.4  8.4 - 78.4 mg/dL   GFR calc non Af Amer 30 (*) >90 mL/min   GFR calc Af Amer 35 (*) >90 mL/min   Comment:            The eGFR has been calculated     using the CKD EPI equation.     This calculation has not been     validated in all clinical     situations.     eGFR's persistently     <90 mL/min signify     possible Chronic Kidney Disease.  PRO B NATRIURETIC PEPTIDE     Status: Abnormal   Collection Time    09/25/12  3:30 PM      Result Value Range   Pro B Natriuretic peptide (BNP) 12391.0 (*) 0 - 450 pg/mL  PROTIME-INR     Status: Abnormal   Collection Time    09/25/12  3:30 PM      Result Value Range   Prothrombin Time 24.0 (*) 11.6 - 15.2 seconds   INR 2.26 (*) 0.00 - 1.49  HEPATIC FUNCTION PANEL     Status: Abnormal   Collection Time    09/25/12  3:30 PM      Result Value Range   Total Protein 7.2  6.0 - 8.3 g/dL   Albumin 3.1 (*) 3.5 - 5.2 g/dL   AST 23  0 - 37 U/L   ALT 15  0 - 35 U/L   Alkaline Phosphatase 74  39 - 117 U/L   Total Bilirubin 0.7  0.3 - 1.2 mg/dL   Bilirubin, Direct 0.2  0.0 - 0.3 mg/dL   Indirect Bilirubin 0.5  0.3 - 0.9 mg/dL  MAGNESIUM  Status: None   Collection Time    09/25/12  3:30 PM      Result Value Range   Magnesium 1.6  1.5 - 2.5 mg/dL  TROPONIN I     Status: None   Collection Time    09/25/12  3:30 PM      Result Value Range   Troponin I <0.30  <0.30 ng/mL   Comment:            Due to the release kinetics of cTnI,     a negative result within the first hours     of the onset of symptoms  does not rule out     myocardial infarction with certainty.     If myocardial infarction is still suspected,     repeat the test at appropriate intervals.  TSH     Status: None   Collection Time    09/25/12  3:30 PM      Result Value Range   TSH 0.992  0.350 - 4.500 uIU/mL  POCT I-STAT TROPONIN I     Status: Abnormal   Collection Time    09/25/12  3:38 PM      Result Value Range   Troponin i, poc 0.22 (*) 0.00 - 0.08 ng/mL   Comment NOTIFIED PHYSICIAN     Comment 3            Comment: Due to the release kinetics of cTnI,     a negative result within the first hours     of the onset of symptoms does not rule out     myocardial infarction with certainty.     If myocardial infarction is still suspected,     repeat the test at appropriate intervals.  GLUCOSE, CAPILLARY     Status: Abnormal   Collection Time    09/25/12  6:54 PM      Result Value Range   Glucose-Capillary 67 (*) 70 - 99 mg/dL  GLUCOSE, CAPILLARY     Status: Abnormal   Collection Time    09/25/12  7:23 PM      Result Value Range   Glucose-Capillary 102 (*) 70 - 99 mg/dL  TROPONIN I     Status: None   Collection Time    09/25/12  8:24 PM      Result Value Range   Troponin I <0.30  <0.30 ng/mL   Comment:            Due to the release kinetics of cTnI,     a negative result within the first hours     of the onset of symptoms does not rule out     myocardial infarction with certainty.     If myocardial infarction is still suspected,     repeat the test at appropriate intervals.  GLUCOSE, CAPILLARY     Status: Abnormal   Collection Time    09/25/12  8:46 PM      Result Value Range   Glucose-Capillary 143 (*) 70 - 99 mg/dL   Comment 1 Notify RN    TROPONIN I     Status: None   Collection Time    09/26/12  1:55 AM      Result Value Range   Troponin I <0.30  <0.30 ng/mL   Comment:            Due to the release kinetics of cTnI,     a negative result within the first hours     of  the onset of symptoms does  not rule out     myocardial infarction with certainty.     If myocardial infarction is still suspected,     repeat the test at appropriate intervals.  BASIC METABOLIC PANEL     Status: Abnormal   Collection Time    09/26/12  1:55 AM      Result Value Range   Sodium 141  135 - 145 mEq/L   Potassium 3.8  3.5 - 5.1 mEq/L   Chloride 100  96 - 112 mEq/L   CO2 30  19 - 32 mEq/L   Glucose, Bld 150 (*) 70 - 99 mg/dL   BUN 28 (*) 6 - 23 mg/dL   Creatinine, Ser 1.61 (*) 0.50 - 1.10 mg/dL   Calcium 9.4  8.4 - 09.6 mg/dL   GFR calc non Af Amer 31 (*) >90 mL/min   GFR calc Af Amer 36 (*) >90 mL/min   Comment:            The eGFR has been calculated     using the CKD EPI equation.     This calculation has not been     validated in all clinical     situations.     eGFR's persistently     <90 mL/min signify     possible Chronic Kidney Disease.  PROTIME-INR     Status: Abnormal   Collection Time    09/26/12  1:55 AM      Result Value Range   Prothrombin Time 24.7 (*) 11.6 - 15.2 seconds   INR 2.35 (*) 0.00 - 1.49  GLUCOSE, CAPILLARY     Status: Abnormal   Collection Time    09/26/12  3:54 AM      Result Value Range   Glucose-Capillary 129 (*) 70 - 99 mg/dL  GLUCOSE, CAPILLARY     Status: Abnormal   Collection Time    09/26/12  7:41 AM      Result Value Range   Glucose-Capillary 143 (*) 70 - 99 mg/dL   Comment 1 Notify RN    GLUCOSE, CAPILLARY     Status: Abnormal   Collection Time    09/26/12 11:49 AM      Result Value Range   Glucose-Capillary 121 (*) 70 - 99 mg/dL   Comment 1 Notify RN    GLUCOSE, CAPILLARY     Status: Abnormal   Collection Time    09/26/12  4:52 PM      Result Value Range   Glucose-Capillary 123 (*) 70 - 99 mg/dL   Comment 1 Notify RN    GLUCOSE, CAPILLARY     Status: None   Collection Time    09/26/12  9:14 PM      Result Value Range   Glucose-Capillary 92  70 - 99 mg/dL  BASIC METABOLIC PANEL     Status: Abnormal   Collection Time    09/27/12  5:35  AM      Result Value Range   Sodium 142  135 - 145 mEq/L   Potassium 3.6  3.5 - 5.1 mEq/L   Chloride 99  96 - 112 mEq/L   CO2 31  19 - 32 mEq/L   Glucose, Bld 140 (*) 70 - 99 mg/dL   BUN 28 (*) 6 - 23 mg/dL   Creatinine, Ser 0.45 (*) 0.50 - 1.10 mg/dL   Calcium 9.4  8.4 - 40.9 mg/dL   GFR calc non Af Amer 31 (*) >90 mL/min  GFR calc Af Amer 36 (*) >90 mL/min   Comment:            The eGFR has been calculated     using the CKD EPI equation.     This calculation has not been     validated in all clinical     situations.     eGFR's persistently     <90 mL/min signify     possible Chronic Kidney Disease.  PROTIME-INR     Status: Abnormal   Collection Time    09/27/12  5:35 AM      Result Value Range   Prothrombin Time 26.3 (*) 11.6 - 15.2 seconds   INR 2.56 (*) 0.00 - 1.49  GLUCOSE, CAPILLARY     Status: Abnormal   Collection Time    09/27/12  7:33 AM      Result Value Range   Glucose-Capillary 166 (*) 70 - 99 mg/dL   Comment 1 Notify RN      Imaging: Dg Chest Port 1 View  09/26/2012  *RADIOLOGY REPORT*  Clinical Data: CHF evaluation.  PORTABLE CHEST - 1 VIEW  Comparison: 08/07/2012  Findings: Enlargement of the central vascular structures.  Heart size is upper limits of normal but unchanged.  Trachea is midline. No focal airspace disease.  No evidence for a pneumothorax.  IMPRESSION: Enlarged central vascular structures suggest vascular congestion or mild edema.   Original Report Authenticated By: Richarda Overlie, M.D.     Assessment:  1. Principal Problem: 2.   Acute on chronic diastolic CHF (congestive heart failure), NYHA class 3, 09/25/12 3. Active Problems: 4.   Pulmonary hypertension 5.   Sleep apnea on c-pap  6.   Aortic insufficiency: mild 7.   Diabetes mellitus type 2 in obese 8.   Diastolic dysfunction stage 2 with an EF of 55% 2D 2012 9.   HTN (hypertension) 10.   Hypertrophic obstructive cardiomyopathy 11.   CKD (chronic kidney disease) stage 3, GFR 30-59  ml/min 12.   PAF (paroxysmal atrial fibrillation), in SR on admit 13.   Plan:  1. She is "peeing a lot" - but not much recorded out. Strict I's/O's and daily weights. Will continue current dose diuretics. Check Pro-BNP in am tomorrow.  Wrap legs with ACE bandage and elevate as much as possible. INR maintaining therapeutic on current warfarin dose, appreciate pharmacy assistance.  Time Spent Directly with Patient:  15 minutes  Length of Stay:  LOS: 2 days   Chrystie Nose, MD, [facility] Attending Cardiologist [facility]  HILTY,Kenneth C 09/27/2012, 9:35 AM

## 2012-09-28 LAB — BASIC METABOLIC PANEL
BUN: 30 mg/dL — ABNORMAL HIGH (ref 6–23)
CO2: 31 mEq/L (ref 19–32)
Chloride: 97 mEq/L (ref 96–112)
Creatinine, Ser: 1.71 mg/dL — ABNORMAL HIGH (ref 0.50–1.10)

## 2012-09-28 LAB — GLUCOSE, CAPILLARY
Glucose-Capillary: 145 mg/dL — ABNORMAL HIGH (ref 70–99)
Glucose-Capillary: 130 mg/dL — ABNORMAL HIGH (ref 70–99)

## 2012-09-28 LAB — PRO B NATRIURETIC PEPTIDE: Pro B Natriuretic peptide (BNP): 14130 pg/mL — ABNORMAL HIGH (ref 0–450)

## 2012-09-28 LAB — PROTIME-INR: Prothrombin Time: 25.8 seconds — ABNORMAL HIGH (ref 11.6–15.2)

## 2012-09-28 MED ORDER — WARFARIN 1.25 MG HALF TABLET
1.2500 mg | ORAL_TABLET | Freq: Once | ORAL | Status: AC
  Administered 2012-09-28: 1.25 mg via ORAL
  Filled 2012-09-28: qty 1

## 2012-09-28 NOTE — Progress Notes (Signed)
[facility]  Subjective: Feeling better. No SOB or CP.  Objective: Vital signs in last 24 hours: Temp:  [97.9 F (36.6 C)-98.1 F (36.7 C)] 98.1 F (36.7 C) (03/16 0500) Pulse Rate:  [76-85] 85 (03/16 0500) Resp:  [18-20] 18 (03/16 0500) BP: (106-118)/(45-76) 118/76 mmHg (03/16 0500) SpO2:  [97 %-100 %] 98 % (03/16 0500) Weight:  [225 lb 8.5 oz (102.3 kg)] 225 lb 8.5 oz (102.3 kg) (03/16 0500) Last BM Date: 09/27/12  Intake/Output from previous day: 03/15 0701 - 03/16 0700 In: 120 [P.O.:120] Out: 2901 [Urine:2900; Stool:1] Intake/Output this shift:    Medications Current Facility-Administered Medications  Medication Dose Route Frequency Provider Last Rate Last Dose  . 0.9 %  sodium chloride infusion  250 mL Intravenous PRN Nada Boozer, NP      . acetaminophen (TYLENOL) tablet 650 mg  650 mg Oral Q4H PRN Nada Boozer, NP      . allopurinol (ZYLOPRIM) tablet 150 mg  150 mg Oral Daily Nada Boozer, NP   150 mg at 09/28/12 1022  . ALPRAZolam Prudy Feeler) tablet 0.25 mg  0.25 mg Oral BID PRN Nada Boozer, NP      . aspirin EC tablet 81 mg  81 mg Oral Daily Nada Boozer, NP   81 mg at 09/27/12 1037  . furosemide (LASIX) injection 80 mg  80 mg Intravenous BID Chrystie Nose, MD   80 mg at 09/28/12 0756  . glimepiride (AMARYL) tablet 4 mg  4 mg Oral QAC breakfast Nada Boozer, NP   4 mg at 09/28/12 0756  . insulin aspart (novoLOG) injection 0-9 Units  0-9 Units Subcutaneous TID WC Nada Boozer, NP   1 Units at 09/28/12 0756  . insulin aspart protamine-insulin aspart (NOVOLOG 70/30) injection 3 Units  3 Units Subcutaneous Q supper Nada Boozer, NP   3 Units at 09/27/12 1735  . metoprolol succinate (TOPROL-XL) 24 hr tablet 37.5 mg  37.5 mg Oral BID Nada Boozer, NP   37.5 mg at 09/28/12 1022  . ondansetron (ZOFRAN) injection 4 mg  4 mg Intravenous Q6H PRN Nada Boozer, NP      . pantoprazole (PROTONIX) EC tablet 40 mg  40 mg Oral Daily Nada Boozer, NP   40  mg at 09/28/12 1022  . potassium chloride SA (K-DUR,KLOR-CON) CR tablet 20 mEq  20 mEq Oral BID Nada Boozer, NP   20 mEq at 09/28/12 1022  . senna-docusate (Senokot-S) tablet 1 tablet  1 tablet Oral QHS PRN Nada Boozer, NP      . simvastatin (ZOCOR) tablet 10 mg  10 mg Oral q1800 Nada Boozer, NP   10 mg at 09/27/12 1734  . sodium chloride 0.9 % injection 3 mL  3 mL Intravenous Q12H Nada Boozer, NP   3 mL at 09/28/12 1022  . sodium chloride 0.9 % injection 3 mL  3 mL Intravenous PRN Nada Boozer, NP      . warfarin (COUMADIN) tablet 1.25 mg  1.25 mg Oral ONCE-1800 Chrystie Nose, MD      . Warfarin - Pharmacist Dosing Inpatient   Does not apply q1800 Benny Lennert, [facility]      . zolpidem [facility]) tablet 5 mg  5 mg Oral QHS PRN Nada Boozer, NP        PE: General appearance: alert, cooperative and no distress Neck: no JVD Lungs: clear to auscultation bilaterally Heart: regular rate and rhythm Extremities: 2+ bilateral edema Pulses: 2+ and symmetric Skin: Skin color,  texture, turgor normal. No rashes or lesions or warm and dry Neurologic: Grossly normal  Lab Results:   Recent Labs  09/25/12 1530  WBC 11.7*  HGB 13.4  HCT 40.7  PLT 255   BMET  Recent Labs  09/26/12 0155 09/27/12 0535 09/28/12 0610  NA 141 142 141  K 3.8 3.6 3.8  CL 100 99 97  CO2 30 31 31   GLUCOSE 150* 140* 157*  BUN 28* 28* 30*  CREATININE 1.57* 1.57* 1.71*  CALCIUM 9.4 9.4 9.3   PT/INR  Recent Labs  09/26/12 0155 09/27/12 0535 09/28/12 0610  LABPROT 24.7* 26.3* 25.8*  INR 2.35* 2.56* 2.50*    Assessment/Plan  Principal Problem:   Acute on chronic diastolic CHF (congestive heart failure), NYHA class 3, 09/25/12 Active Problems:   Pulmonary hypertension   Sleep apnea on c-pap    Aortic insufficiency: mild   Diabetes mellitus type 2 in obese   Diastolic dysfunction stage 2 with an EF of 55% 2D 2012   HTN (hypertension)   Hypertrophic obstructive cardiomyopathy   CKD (chronic  kidney disease) stage 3, GFR 30-59 ml/min   PAF (paroxysmal atrial fibrillation), in SR on admit  Plan: Pt had good diuresis in past 24 hrs. Net fluid loss of 2.9 L. Pro BNP is actually up from time of admission. Pro BNP today is 14,130 (12,391 3 days ago).  Her weight is down 4 lbs since admission. Her weight was up 8 lbs from her dry weight when she was admitted. Will continue to diurese with IV Lasix for another day and consider switching to PO tomorrow. Cr is 1.7. K+ is WNL.     LOS: 3 days    Brittainy M. Sharol Harness, PA-C 09/28/2012 10:54 AM  I have seen and examined the patient along with Brittainy M. Sharol Harness, PA-C.  I have reviewed the chart, notes and new data.  I agree with PA's note.  Key new complaints: little dyspnea (minimal activity level though) Key examination changes: still with impressive pedal edema; lost about 4 lb, need to diurese another net 4 lb Key new findings / data: BNP actually higher, renal function slightly worse  PLAN: Continue diuretics with renal function monitoring. May be ready to transition to PO diuretics tomorrow.  Thurmon Fair, MD, [facility] and [facility] Center [phone] 09/28/2012, 12:10 PM

## 2012-09-28 NOTE — Progress Notes (Signed)
ANTICOAGULATION CONSULT NOTE - Follow-Up Consult  Pharmacy Consult for coumadin Indication: atrial fibrillation  No Known Allergies  Patient Measurements: Wt= 102kg  Labs:  Recent Labs  09/25/12 1530 09/25/12 2024 09/26/12 0155 09/27/12 0535 09/28/12 0610  HGB 13.4  --   --   --   --   HCT 40.7  --   --   --   --   PLT 255  --   --   --   --   LABPROT 24.0*  --  24.7* 26.3* 25.8*  INR 2.26*  --  2.35* 2.56* 2.50*  CREATININE 1.59*  --  1.57* 1.57* 1.71*  TROPONINI <0.30 <0.30 <0.30  --   --     Estimated Creatinine Clearance: 33.8 ml/min (by C-G formula based on Cr of 1.71).   Assessment: 79 yo female here with HF and on coumadin PTA for afib (started three weeks ago per patient). INR today is at goal (2.50) . Holding steady on lower dose that PTA   No bleeding noted.  Last cbc stable 3/13.    Coumadin home dose: 2.5mg /day except for MF patient takes 1.25mg .  - this may need to be adjusted at d/c to 1.25mg  TTSS / 2.5mg  MWF Goal of Therapy:  INR 2-3 Monitor platelets by anticoagulation protocol: Yes   Plan:  Coumadin 1.25mg  x1 today -Daily PT/INR  Leota Sauers Pharm.D. CPP, BCPS Clinical Pharmacist [phone] 09/28/2012 8:41 AM

## 2012-09-29 ENCOUNTER — Ambulatory Visit: Payer: Self-pay | Admitting: Internal Medicine

## 2012-09-29 DIAGNOSIS — Z7901 Long term (current) use of anticoagulants: Secondary | ICD-10-CM | POA: Insufficient documentation

## 2012-09-29 DIAGNOSIS — I639 Cerebral infarction, unspecified: Secondary | ICD-10-CM

## 2012-09-29 LAB — GLUCOSE, CAPILLARY: Glucose-Capillary: 152 mg/dL — ABNORMAL HIGH (ref 70–99)

## 2012-09-29 MED ORDER — FUROSEMIDE 80 MG PO TABS
80.0000 mg | ORAL_TABLET | Freq: Two times a day (BID) | ORAL | Status: DC
  Administered 2012-09-29 – 2012-09-30 (×2): 80 mg via ORAL
  Filled 2012-09-29 (×4): qty 1

## 2012-09-29 MED ORDER — WARFARIN 1.25 MG HALF TABLET
1.2500 mg | ORAL_TABLET | Freq: Once | ORAL | Status: AC
  Administered 2012-09-29: 1.25 mg via ORAL
  Filled 2012-09-29: qty 1

## 2012-09-29 MED ORDER — ISOSORB DINITRATE-HYDRALAZINE 20-37.5 MG PO TABS
1.0000 | ORAL_TABLET | Freq: Three times a day (TID) | ORAL | Status: DC
  Administered 2012-09-29 – 2012-09-30 (×4): 1 via ORAL
  Filled 2012-09-29 (×5): qty 1

## 2012-09-29 NOTE — Progress Notes (Signed)
ANTICOAGULATION CONSULT NOTE - Follow Up Consult  Pharmacy Consult for Coumadin Indication: atrial fibrillation  No Known Allergies  Patient Measurements: Height: 5\' 6"  (167.6 cm) Weight: 224 lb (101.606 kg) IBW/kg (Calculated) : 59.3  Vital Signs: Temp: 98.2 F (36.8 C) (03/17 0638) Temp src: Oral (03/17 0638) BP: 118/54 mmHg (03/17 0638) Pulse Rate: 74 (03/17 0638)  Labs:  Recent Labs  09/27/12 0535 09/28/12 0610 09/29/12 0555  LABPROT 26.3* 25.8* 25.2*  INR 2.56* 2.50* 2.42*  CREATININE 1.57* 1.71*  --     Estimated Creatinine Clearance: 33.7 ml/min (by C-G formula based on Cr of 1.71).   Medications:  Scheduled:  . allopurinol  150 mg Oral Daily  . aspirin EC  81 mg Oral Daily  . furosemide  80 mg Intravenous BID  . glimepiride  4 mg Oral QAC breakfast  . insulin aspart  0-9 Units Subcutaneous TID WC  . insulin aspart protamine-insulin aspart  3 Units Subcutaneous Q supper  . metoprolol succinate  37.5 mg Oral BID  . pantoprazole  40 mg Oral Daily  . potassium chloride SA  20 mEq Oral BID  . simvastatin  10 mg Oral q1800  . sodium chloride  3 mL Intravenous Q12H  . [COMPLETED] warfarin  1.25 mg Oral ONCE-1800  . Warfarin - Pharmacist Dosing Inpatient   Does not apply q1800    Assessment: 79 year old female admitted with HF on Coumadin PTA for atrial fibrillation started 3 weeks prior according to patient. Patient's dose of Coumadin PTA was 2.5mg  daily except 1.25mg  on MF. INR on admission was therpauetic at 2.26.    INR is therapeutic today at 2.42. H/H 13.4/4.7 and plts 255 from CBC on 3/13. No evidence of bleeding at this time.   Goal of Therapy:  INR 2-3 Monitor platelets by anticoagulation protocol: Yes   Plan:  Coumadin 1.25mg  x1 tonight  Daily PT/INR  Since INR seems to be stable, if discharged today would recommend PTA coumadin regimen at discharge  Micheline Chapman PharmD Candidate 09/29/2012,9:55 AM  Agree with Rebecca's assessment and  plan. Dorna Leitz, PharmD, BCPS

## 2012-09-29 NOTE — Progress Notes (Signed)
[facility]  Subjective: Breathing has improved.   Objective: Vital signs in last 24 hours: Temp:  [97.4 F (36.3 C)-98.2 F (36.8 C)] 98.2 F (36.8 C) (03/17 1610) Pulse Rate:  [74-97] 97 (03/17 1046) Resp:  [18] 18 (03/17 0638) BP: (95-118)/(54-75) 110/66 mmHg (03/17 1046) SpO2:  [97 %-100 %] 97 % (03/17 9604) Weight:  [224 lb (101.606 kg)] 224 lb (101.606 kg) (03/17 5409) Last BM Date: 09/28/12  Intake/Output from previous day: 03/16 0701 - 03/17 0700 In: 840 [P.O.:840] Out: 1300 [Urine:1300] Intake/Output this shift: Total I/O In: 240 [P.O.:240] Out: -   Medications Current Facility-Administered Medications  Medication Dose Route Frequency Provider Last Rate Last Dose  . 0.9 %  sodium chloride infusion  250 mL Intravenous PRN Nada Boozer, NP      . acetaminophen (TYLENOL) tablet 650 mg  650 mg Oral Q4H PRN Nada Boozer, NP      . allopurinol (ZYLOPRIM) tablet 150 mg  150 mg Oral Daily Nada Boozer, NP   150 mg at 09/29/12 1045  . ALPRAZolam Prudy Feeler) tablet 0.25 mg  0.25 mg Oral BID PRN Nada Boozer, NP      . aspirin EC tablet 81 mg  81 mg Oral Daily Nada Boozer, NP   81 mg at 09/27/12 1037  . furosemide (LASIX) injection 80 mg  80 mg Intravenous BID Chrystie Nose, MD   80 mg at 09/29/12 0900  . glimepiride (AMARYL) tablet 4 mg  4 mg Oral QAC breakfast Nada Boozer, NP   4 mg at 09/29/12 0853  . insulin aspart (novoLOG) injection 0-9 Units  0-9 Units Subcutaneous TID WC Nada Boozer, NP   2 Units at 09/29/12 0854  . insulin aspart protamine-insulin aspart (NOVOLOG 70/30) injection 3 Units  3 Units Subcutaneous Q supper Nada Boozer, NP   3 Units at 09/28/12 1804  . metoprolol succinate (TOPROL-XL) 24 hr tablet 37.5 mg  37.5 mg Oral BID Nada Boozer, NP   37.5 mg at 09/29/12 1046  . ondansetron (ZOFRAN) injection 4 mg  4 mg Intravenous Q6H PRN Nada Boozer, NP      . pantoprazole (PROTONIX) EC tablet 40 mg  40 mg Oral Daily Nada Boozer,  NP   40 mg at 09/29/12 1047  . potassium chloride SA (K-DUR,KLOR-CON) CR tablet 20 mEq  20 mEq Oral BID Nada Boozer, NP   20 mEq at 09/29/12 1047  . senna-docusate (Senokot-S) tablet 1 tablet  1 tablet Oral QHS PRN Nada Boozer, NP      . simvastatin (ZOCOR) tablet 10 mg  10 mg Oral q1800 Nada Boozer, NP   10 mg at 09/28/12 1808  . sodium chloride 0.9 % injection 3 mL  3 mL Intravenous Q12H Nada Boozer, NP   3 mL at 09/29/12 0859  . sodium chloride 0.9 % injection 3 mL  3 mL Intravenous PRN Nada Boozer, NP      . warfarin (COUMADIN) tablet 1.25 mg  1.25 mg Oral ONCE-1800 Anh P Pham, RPH      . Warfarin - Pharmacist Dosing Inpatient   Does not apply q1800 Benny Lennert, RPH      . zolpidem [facility]) tablet 5 mg  5 mg Oral QHS PRN Nada Boozer, NP        PE: General appearance: alert, cooperative and no distress Lungs: clear to auscultation bilaterally Heart: regular rate and rhythm Extremities: 3+ bilateral pedal edema Pulses: 2+ and symmetric Skin: warm and dry Neurologic: Grossly  normal  Lab Results:  No results found for this basename: WBC, HGB, HCT, PLT,  in the last 72 hours BMET  Recent Labs  09/27/12 0535 09/28/12 0610  NA 142 141  K 3.6 3.8  CL 99 97  CO2 31 31  GLUCOSE 140* 157*  BUN 28* 30*  CREATININE 1.57* 1.71*  CALCIUM 9.4 9.3   PT/INR  Recent Labs  09/27/12 0535 09/28/12 0610 09/29/12 0555  LABPROT 26.3* 25.8* 25.2*  INR 2.56* 2.50* 2.42*   Filed Weights   09/27/12 0525 09/28/12 0500 09/29/12 1610  Weight: 227 lb 6.4 oz (103.148 kg) 225 lb 8.5 oz (102.3 kg) 224 lb (101.606 kg)    Assessment/Plan  Principal Problem:   Acute on chronic diastolic CHF (congestive heart failure), NYHA class 3, 09/25/12 Active Problems:   Pulmonary hypertension   Sleep apnea on c-pap    Aortic insufficiency: mild   Diabetes mellitus type 2 in obese   Diastolic dysfunction stage 2 with an EF of 55% 2D 2012   HTN (hypertension)   Hypertrophic obstructive  cardiomyopathy   CKD (chronic kidney disease) stage 3, GFR 30-59 ml/min   PAF (paroxysmal atrial fibrillation), in SR on admit  Plan:  She is only down 1 lb since yesterday. Will need to diurese another 3 lbs to get back to dry weight. Bilateral LEE has improved, but is still significant. Cr. Increased yesterday to 1.71. Today's labs are pending. If Cr. Is stable, then will continue to diurese with IV Lasix, then consider switching to PO in the am. She is otherwise stable. MD to follow with further recommendation.    LOS: 4 days    Brittainy M. Sharol Harness, PA-C 09/29/2012 11:11 AM  I have seen and examined the patient along with Brittainy M. Sharol Harness, PA-C.  I have reviewed the chart, notes and new data.  I agree with PA's note.  No labs available today. Symptoms of left heart failure mostly resolved, but still with important edema and well above "dry weight". Continue diuretics, but switch to PO. Reevaluate labs in AM. Add hydralazine/nitrates.  Thurmon Fair, MD, [facility] and [facility] Center [phone] 09/29/2012, 4:18 PM

## 2012-09-30 LAB — BASIC METABOLIC PANEL
BUN: 31 mg/dL — ABNORMAL HIGH (ref 6–23)
Chloride: 100 mEq/L (ref 96–112)
GFR calc Af Amer: 32 mL/min — ABNORMAL LOW (ref >90)
Potassium: 3.5 mEq/L (ref 3.5–5.1)

## 2012-09-30 LAB — GLUCOSE, CAPILLARY: Glucose-Capillary: 126 mg/dL — ABNORMAL HIGH (ref 70–99)

## 2012-09-30 LAB — PROTIME-INR: Prothrombin Time: 23.5 seconds — ABNORMAL HIGH (ref 11.6–15.2)

## 2012-09-30 MED ORDER — ISOSORB DINITRATE-HYDRALAZINE 20-37.5 MG PO TABS: 1.0000 | ORAL_TABLET | Freq: Three times a day (TID) | ORAL | Status: DC

## 2012-09-30 MED ORDER — ASPIRIN 81 MG PO TBEC: 81.0000 mg | DELAYED_RELEASE_TABLET | Freq: Every day | ORAL | Status: DC

## 2012-09-30 MED ORDER — FUROSEMIDE 80 MG PO TABS: 80.0000 mg | ORAL_TABLET | Freq: Two times a day (BID) | ORAL | Status: DC

## 2012-09-30 MED ORDER — METOPROLOL SUCCINATE 12.5 MG HALF TABLET: 37.5000 mg | ORAL_TABLET | Freq: Two times a day (BID) | ORAL | Status: DC

## 2012-09-30 MED ORDER — WARFARIN SODIUM 2.5 MG PO TABS
2.5000 mg | ORAL_TABLET | Freq: Once | ORAL | Status: DC
  Filled 2012-09-30: qty 1

## 2012-09-30 NOTE — Progress Notes (Signed)
Reviewed discharge instructions with patient and she stated her understanding.  Patient discharged via wheelchair home with family and home health nurse.  Colman Cater

## 2012-09-30 NOTE — Progress Notes (Signed)
Subjective:  She says SOB is better.  Objective:  Vital Signs in the last 24 hours: Temp:  [97.7 F (36.5 C)-98.2 F (36.8 C)] 98.2 F (36.8 C) (03/18 0604) Pulse Rate:  [74-98] 98 (03/18 0604) Resp:  [18-20] 18 (03/18 0604) BP: (96-127)/(55-71) 121/71 mmHg (03/18 0604) SpO2:  [95 %-99 %] 99 % (03/18 0604) Weight:  [101.061 kg (222 lb 12.8 oz)] 101.061 kg (222 lb 12.8 oz) (03/18 0604)  Intake/Output from previous day:  Intake/Output Summary (Last 24 hours) at 09/30/12 0936 Last data filed at 09/29/12 1700  Gross per 24 hour  Intake    840 ml  Output    800 ml  Net     40 ml    Physical Exam: General appearance: alert, cooperative, no distress and morbidly obese Lungs: clear to auscultation bilaterally Heart: regular rate and rhythm Bilat Una boots in place. Rt hand/wrist wrap in place   Rate: 78  Rhythm: normal sinus rhythm  Lab Results: No results found for this basename: WBC, HGB, PLT,  in the last 72 hours  Recent Labs  09/28/12 0610 09/30/12 0535  NA 141 142  K 3.8 3.5  CL 97 100  CO2 31 29  GLUCOSE 157* 122*  BUN 30* 31*  CREATININE 1.71* 1.72*   No results found for this basename: TROPONINI, CK, MB,  in the last 72 hours Hepatic Function Panel No results found for this basename: PROT, ALBUMIN, AST, ALT, ALKPHOS, BILITOT, BILIDIR, IBILI,  in the last 72 hours No results found for this basename: CHOL,  in the last 72 hours  Recent Labs  09/30/12 0535  INR 2.20*    Imaging: Imaging results have been reviewed  Cardiac Studies:  Assessment/Plan:   Principal Problem:   Acute on chronic diastolic CHF (congestive heart failure), NYHA class 3, 09/25/12 Active Problems:   Diabetes mellitus type 2 in obese   Diastolic dysfunction stage 2 with an EF of 55% 2D 2012   CKD (chronic kidney disease) stage 3, GFR 30-59 ml/min   PAF (paroxysmal atrial fibrillation), in SR on admit   Long term (current) use of anticoagulants   Pulmonary hypertension  Sleep apnea on c-pap    Aortic insufficiency: mild   HTN (hypertension)   Hypertrophic obstructive cardiomyopathy   History of Rt brain CVA   Plan- It's difficult to tell if she is still volume overloaded. Her symptoms are improved and her wgt is down to ? Dry wgt of 233. Her BNP is 11000. She has not been compliant with C-pap since she cant apply device after she had hand surgery.   Corine Shelter PA-C 09/30/2012, 9:36 AM   Agree with note written by Corine Shelter [facility]  Pt admitted with volume overload/CHF/Diastolic dysfunction. Diuresed. Wgt down but BNP w/o change. On coumadin for PAF, currently in NSR. CRI. On exam lungs clear. Cor RRR. OK for D/C home. TCM 7.  Runell Gess 09/30/2012 9:41 AM

## 2012-09-30 NOTE — Care Management Note (Signed)
    Page 1 of 1   09/30/2012     3:12:10 PM   CARE MANAGEMENT NOTE 09/30/2012  Patient:  [name], [name]   Account Number:  [id number]  Date Initiated:  09/30/2012  Documentation initiated by:  GRAVES-BIGELOW,Massimo Hartland  Subjective/Objective Assessment:   Pt admitted with Shortness of breath, orthopnea, weight gain, weeping edema. Pt is from home with Husband and son. Plan for home today with [facility] services.     Action/Plan:   CM did speak to pt and she wants  [facility] services with [facility]. CM will make referral. Pt will need order for [facility] RN and DME 3n1. RN aware and will need order for services and DME.   Anticipated DC Date:  09/30/2012   Anticipated DC Plan:  HOME W HOME HEALTH SERVICES      DC Planning Services  CM consult      [facility] Choice  HOME HEALTH  DURABLE MEDICAL EQUIPMENT   Choice offered to / List presented to:  C-1 Patient   DME arranged  3-N-1      DME agency  [facility]        Status of service:  Completed, signed off Medicare Important Message given?   (If response is "NO", the following Medicare IM given date fields will be blank) Date Medicare IM given:   Date Additional Medicare IM given:    Discharge Disposition:  HOME W HOME HEALTH SERVICES  Per UR Regulation:  Reviewed for med. necessity/level of care/duration of stay  If discussed at Long Length of Stay Meetings, dates discussed:    Comments:

## 2012-09-30 NOTE — Progress Notes (Signed)
ANTICOAGULATION CONSULT NOTE - Follow Up Consult  Pharmacy Consult for Coumadin Indication: atrial fibrillation  No Known Allergies  Patient Measurements: Height: 5\' 6"  (167.6 cm) Weight: 222 lb 12.8 oz (101.061 kg) IBW/kg (Calculated) : 59.3  Vital Signs: Temp: 98.2 F (36.8 C) (03/18 0604) Temp src: Oral (03/18 0604) BP: 121/71 mmHg (03/18 0604) Pulse Rate: 98 (03/18 0604)  Labs:  Recent Labs  09/28/12 0610 09/29/12 0555 09/30/12 0535  LABPROT 25.8* 25.2* 23.5*  INR 2.50* 2.42* 2.20*  CREATININE 1.71*  --  1.72*    Estimated Creatinine Clearance: 33.4 ml/min (by C-G formula based on Cr of 1.72).   Medications:  . allopurinol  150 mg Oral Daily  . aspirin EC  81 mg Oral Daily  . furosemide  80 mg Oral BID  . glimepiride  4 mg Oral QAC breakfast  . insulin aspart  0-9 Units Subcutaneous TID WC  . insulin aspart protamine-insulin aspart  3 Units Subcutaneous Q supper  . isosorbide-hydrALAZINE  1 tablet Oral TID  . metoprolol succinate  37.5 mg Oral BID  . pantoprazole  40 mg Oral Daily  . potassium chloride SA  20 mEq Oral BID  . simvastatin  10 mg Oral q1800  . sodium chloride  3 mL Intravenous Q12H  . [COMPLETED] warfarin  1.25 mg Oral ONCE-1800  . Warfarin - Pharmacist Dosing Inpatient   Does not apply q1800  . [DISCONTINUED] furosemide  80 mg Intravenous BID   Assessment: 79 year old female admitted with HF on Coumadin PTA for atrial fibrillation. Coumadin was started 3 weeks prior according to patient. Patient's dose of Coumadin PTA was 2.5mg  daily except 1.25mg  on MF. INR therapeutic today at 2.20. No new CBC today. No evidence of bleeding at this time.   Goal of Therapy:  INR 2-3 Monitor platelets by anticoagulation protocol: Yes   Plan:  Coumadin 2.5mg  x1 tonight  Daily PT/INR   Micheline Chapman PharmD Candidate  09/30/2012,9:59 AM  Agree with Rebecca's assessment and plan. Dorna Leitz, PharmD, BCPS

## 2012-10-06 NOTE — Discharge Summary (Signed)
Physician Discharge Summary  Patient ID: [name] MRN: [id number] DOB/AGE: 21-Jun-1934 79 y.o.  Admit date: 09/25/2012 Discharge date: 10/06/2012  Admission Diagnoses:  Acute on chronic diastolic CHF (congestive heart failure)  Discharge Diagnoses:  Principal Problem:   Acute on chronic diastolic CHF (congestive heart failure), NYHA class 3, 09/25/12 Active Problems:   Pulmonary hypertension   Sleep apnea on c-pap    Aortic insufficiency: mild   Diabetes mellitus type 2 in obese   Diastolic dysfunction stage 2 with an EF of 55% 2D 2012   HTN (hypertension)   Hypertrophic obstructive cardiomyopathy   CKD (chronic kidney disease) stage 3, GFR 30-59 ml/min   History of Rt brain CVA   PAF (paroxysmal atrial fibrillation), in SR on admit   Long term (current) use of anticoagulants   Discharged Condition: stable  Hospital Course:   79 year old female who I had seen in January for regular followup. Unfortunately, that morning she presented, she had suffered a moderate-sized acute right opercular stroke. She was sent from the office directly to the hospital. Unfortunately, it was after the time when she would benefit from tPA. The effects of the stroke, however, were minimal. Subsequently, she was found to have atrial fibrillation on a CardioNet monitor and was started on Coumadin, with a therapeutic INR today checked in the office; it was 2.6. She was seen by one of our nurse practitioners on September 05, 2012, and was noted to have worsening shortness of breath and weight gain as well as lower extremity edema. She recommended increasing her metolazone for several days and then going back to her regular diuretic doses. Recently, over the past several weeks, she has had increasing weight gain, and her weight was up to 242 from 234 about 1 month ago. She started to have marked lower extremity edema, including weeping lower extremity edema, and was seen at a wound care center, during which her  legs were wrapped. She also has had worsening orthopnea and shortness of breath with minimal exertion.  She was admitted in decompensated heart failure and started on BID IV Lasix.  Her legs were wrapped with ACE bandages.  INR was therapeutic on coumadin.  She did diurese ~5L with improvement in symptoms.  A nutrition consult was obtained.  Dry weight is approximatley 233 #. She was discharged in stable condition after beng seen by Dr. Allyson Sabal.  FU arranged.     Consults:  Nutrition  Significant Diagnostic Studies:  CBC    Component Value Date/Time   WBC 11.7* 09/25/2012 1530   RBC 4.34 09/25/2012 1530   HGB 13.4 09/25/2012 1530   HCT 40.7 09/25/2012 1530   PLT 255 09/25/2012 1530   MCV 93.8 09/25/2012 1530   MCH 30.9 09/25/2012 1530   MCHC 32.9 09/25/2012 1530   RDW 15.0 09/25/2012 1530   LYMPHSABS 1.9 08/07/2012 1200   MONOABS 0.8 08/07/2012 1200   EOSABS 0.4 08/07/2012 1200   BASOSABS 0.0 08/07/2012 1200    BMET    Component Value Date/Time   NA 142 09/30/2012 0535   K 3.5 09/30/2012 0535   CL 100 09/30/2012 0535   CO2 29 09/30/2012 0535   GLUCOSE 122* 09/30/2012 0535   BUN 31* 09/30/2012 0535   CREATININE 1.72* 09/30/2012 0535   CALCIUM 8.9 09/30/2012 0535   GFRNONAA 28* 09/30/2012 0535   GFRAA 32* 09/30/2012 0535   PORTABLE CHEST - 1 VIEW  Comparison: 08/07/2012  Findings: Enlargement of the central vascular structures. Heart  size is  upper limits of normal but unchanged. Trachea is midline.  No focal airspace disease. No evidence for a pneumothorax.  IMPRESSION:  Enlarged central vascular structures suggest vascular congestion or  mild edema.   Treatments: See above.  Discharge Exam: Blood pressure 132/66, pulse 91, temperature 97.6 F (36.4 C), temperature source Oral, resp. rate 18, height 5\' 6"  (1.676 m), weight 101.061 kg (222 lb 12.8 oz), SpO2 100.00%.   Disposition: 01-Home or Self Care  Discharge Orders   Future Appointments Provider Department Dept Phone   11/12/2012  2:30 PM Micki Riley, MD [facility] [phone]   Future Orders Complete By Expires     Diet - low sodium heart healthy  As directed     Increase activity slowly  As directed         Medication List    STOP taking these medications       metolazone 2.5 MG tablet  Commonly known as:  ZAROXOLYN      TAKE these medications       allopurinol 300 MG tablet  Commonly known as:  ZYLOPRIM  Take 150 mg by mouth daily.     furosemide 80 MG tablet  Commonly known as:  LASIX  Take 1 tablet (80 mg total) by mouth 2 (two) times daily.     glimepiride 4 MG tablet  Commonly known as:  AMARYL  Take 4 mg by mouth daily before breakfast.     insulin lispro protamine-insulin lispro (75-25) 100 UNIT/ML Susp  Commonly known as:  HUMALOG 75/25  Inject 5-10 Units into the skin 2 (two) times daily with a meal. 10 units with breakfast and 5 units at dinner     isosorbide-hydrALAZINE 20-37.5 MG per tablet  Commonly known as:  BIDIL  Take 1 tablet by mouth 3 (three) times daily.     metoprolol succinate 12.5 mg Tb24  Commonly known as:  TOPROL-XL  Take 1.5 tablets (37.5 mg total) by mouth 2 (two) times daily.     omeprazole 20 MG capsule  Commonly known as:  PRILOSEC  Take 20 mg by mouth every morning.     potassium chloride SA 20 MEQ tablet  Commonly known as:  K-DUR,KLOR-CON  Take 1 tablet (20 mEq total) by mouth 2 (two) times daily.     senna-docusate 8.6-50 MG per tablet  Commonly known as:  Senokot-S  Take 1 tablet by mouth at bedtime as needed for constipation.     simvastatin 10 MG tablet  Commonly known as:  ZOCOR  Take 1 tablet (10 mg total) by mouth daily at 6 PM.     warfarin 2.5 MG tablet  Commonly known as:  COUMADIN  Take 1.25-2.5 mg by mouth daily. 2.5mg /day except take 1.25mg  on MF           Follow-up Information   Follow up with Abelino Derrick, PA-C. (Our office will call with the appointment date and time.)    Contact information:   [address] [place] [address] [phone]       Signed: Wilburt Finlay 10/06/2012, 4:00 PM

## 2012-10-14 NOTE — Progress Notes (Signed)
[facility]  NAME:  [name], [name]                      ACCOUNT NO.:  MEDICAL RECORD NO.:  [id number]      DATE OF BIRTH:  05-29-1934  PHYSICIAN:  Wayland Denis, DO       VISIT DATE:  10/13/2012                                  OFFICE VISIT   The patient is a 79 year old female who is here for followup on her bilateral lower extremity ulcers.  She is doing extremely well and at this point is completely healed.  Her swelling is markedly improved. There has been no change in medications or social history.  On exam, she is alert, oriented, cooperative, not in any acute distress. She is pleasant.  Pupils are equal.  Extraocular muscles are intact.  No cervical lymphadenopathy.  Her breathing is unlabored.  Her heart is regular.  Her abdomen is soft.  The wounds are completely healed up.  No open areas.  The swelling is improved.  Recommend continuing with the compression as this is essential for keeping these wounds healed.  She acknowledges understanding and agreeing with that, and we will see her back as needed.     Wayland Denis, DO     CS/MEDQ  D:  10/13/2012  T:  10/14/2012  Job:  [id number]

## 2012-11-10 ENCOUNTER — Ambulatory Visit: Payer: Medicare Other | Admitting: *Deleted

## 2012-11-12 ENCOUNTER — Ambulatory Visit: Payer: Self-pay | Admitting: Neurology

## 2012-11-21 ENCOUNTER — Encounter (HOSPITAL_BASED_OUTPATIENT_CLINIC_OR_DEPARTMENT_OTHER): Payer: Medicare Other | Attending: General Surgery

## 2012-11-21 DIAGNOSIS — I2789 Other specified pulmonary heart diseases: Secondary | ICD-10-CM | POA: Insufficient documentation

## 2012-11-21 DIAGNOSIS — Z7982 Long term (current) use of aspirin: Secondary | ICD-10-CM | POA: Insufficient documentation

## 2012-11-21 DIAGNOSIS — Z79899 Other long term (current) drug therapy: Secondary | ICD-10-CM | POA: Insufficient documentation

## 2012-11-21 DIAGNOSIS — I2510 Atherosclerotic heart disease of native coronary artery without angina pectoris: Secondary | ICD-10-CM | POA: Insufficient documentation

## 2012-11-21 DIAGNOSIS — E119 Type 2 diabetes mellitus without complications: Secondary | ICD-10-CM | POA: Insufficient documentation

## 2012-11-21 DIAGNOSIS — I421 Obstructive hypertrophic cardiomyopathy: Secondary | ICD-10-CM | POA: Insufficient documentation

## 2012-11-21 DIAGNOSIS — I129 Hypertensive chronic kidney disease with stage 1 through stage 4 chronic kidney disease, or unspecified chronic kidney disease: Secondary | ICD-10-CM | POA: Insufficient documentation

## 2012-11-21 DIAGNOSIS — I872 Venous insufficiency (chronic) (peripheral): Secondary | ICD-10-CM | POA: Insufficient documentation

## 2012-11-21 DIAGNOSIS — I509 Heart failure, unspecified: Secondary | ICD-10-CM | POA: Insufficient documentation

## 2012-11-21 DIAGNOSIS — N189 Chronic kidney disease, unspecified: Secondary | ICD-10-CM | POA: Insufficient documentation

## 2012-11-21 DIAGNOSIS — L97809 Non-pressure chronic ulcer of other part of unspecified lower leg with unspecified severity: Secondary | ICD-10-CM | POA: Insufficient documentation

## 2012-11-22 NOTE — Progress Notes (Signed)
[facility]  NAME:  [name], [name]                 ACCOUNT NO.:  [id number]  MEDICAL RECORD NO.:  [id number]      DATE OF BIRTH:  25-Sep-1933  PHYSICIAN:  Ardath Sax, M.D.           VISIT DATE:                                  OFFICE VISIT   This is a 79 year old lady who has been here before for treatment of her chronic venous stasis of both of her legs.  She has many medical illnesses including congestive heart failure, pulmonary hypertension, morbid obesity, aortic insufficiency, diabetes type 2, hypertension, hypertrophic obstructive cardiomyopathy, chronic kidney disease.  She is on many medicines including aspirin, clonidine, Lasix, glyparamide, metolazone.  She is also on insulin 75/25, 15 units every evening.  She is on metoprolol, omeprazole, and potassium.  When she came in here, she was complaining of marked swelling of her feet and she does have severe swelling almost to the point where I think not only it is venous stasis, but congestive heart failure.  She weighs 236 pounds.  Her blood pressure was 117/71, respirations 16, pulse 80, temperature 97.  Her blood sugar was 169.  We were treating her with Unna boots for compression and were putting silver alginate on the wounds.  I have told her to continue her Lasix as I feel like a lot of this is due to congestive heart failure, so her diagnosis is chronic venous stasis, chronic venous ulcers, type 2 diabetes, hypertension, congestive heart failure, chronic renal failure, history of aortic insufficiency and pulmonary hypertension, arteriosclerotic heart disease.  We will see her in a week.     Ardath Sax, M.D.     PP/MEDQ  D:  11/21/2012  T:  11/22/2012  Job:  [id number]

## 2012-11-24 ENCOUNTER — Encounter: Payer: Self-pay | Admitting: Internal Medicine

## 2012-11-25 ENCOUNTER — Encounter: Payer: Self-pay | Admitting: Cardiology

## 2012-11-25 ENCOUNTER — Ambulatory Visit (INDEPENDENT_AMBULATORY_CARE_PROVIDER_SITE_OTHER): Payer: Medicare Other | Admitting: Cardiology

## 2012-11-25 ENCOUNTER — Ambulatory Visit (INDEPENDENT_AMBULATORY_CARE_PROVIDER_SITE_OTHER): Payer: Medicare Other | Admitting: Pharmacist Clinician (PhC)/ Clinical Pharmacy Specialist

## 2012-11-25 VITALS — BP 116/52 | HR 88 | Ht 64.0 in | Wt 238.8 lb

## 2012-11-25 DIAGNOSIS — I519 Heart disease, unspecified: Secondary | ICD-10-CM

## 2012-11-25 DIAGNOSIS — I639 Cerebral infarction, unspecified: Secondary | ICD-10-CM

## 2012-11-25 DIAGNOSIS — I878 Other specified disorders of veins: Secondary | ICD-10-CM

## 2012-11-25 DIAGNOSIS — G4730 Sleep apnea, unspecified: Secondary | ICD-10-CM

## 2012-11-25 DIAGNOSIS — R609 Edema, unspecified: Secondary | ICD-10-CM

## 2012-11-25 DIAGNOSIS — E669 Obesity, unspecified: Secondary | ICD-10-CM

## 2012-11-25 DIAGNOSIS — I5033 Acute on chronic diastolic (congestive) heart failure: Secondary | ICD-10-CM

## 2012-11-25 DIAGNOSIS — I872 Venous insufficiency (chronic) (peripheral): Secondary | ICD-10-CM

## 2012-11-25 DIAGNOSIS — I5189 Other ill-defined heart diseases: Secondary | ICD-10-CM

## 2012-11-25 DIAGNOSIS — I6350 Cerebral infarction due to unspecified occlusion or stenosis of unspecified cerebral artery: Secondary | ICD-10-CM

## 2012-11-25 DIAGNOSIS — I4891 Unspecified atrial fibrillation: Secondary | ICD-10-CM

## 2012-11-25 DIAGNOSIS — I509 Heart failure, unspecified: Secondary | ICD-10-CM

## 2012-11-25 DIAGNOSIS — I10 Essential (primary) hypertension: Secondary | ICD-10-CM

## 2012-11-25 DIAGNOSIS — I480 Paroxysmal atrial fibrillation: Secondary | ICD-10-CM

## 2012-11-25 DIAGNOSIS — Z9989 Dependence on other enabling machines and devices: Secondary | ICD-10-CM

## 2012-11-25 DIAGNOSIS — E785 Hyperlipidemia, unspecified: Secondary | ICD-10-CM

## 2012-11-25 DIAGNOSIS — R0602 Shortness of breath: Secondary | ICD-10-CM

## 2012-11-25 DIAGNOSIS — N183 Chronic kidney disease, stage 3 unspecified: Secondary | ICD-10-CM

## 2012-11-25 DIAGNOSIS — G4733 Obstructive sleep apnea (adult) (pediatric): Secondary | ICD-10-CM

## 2012-11-25 DIAGNOSIS — Z7901 Long term (current) use of anticoagulants: Secondary | ICD-10-CM

## 2012-11-25 DIAGNOSIS — I5032 Chronic diastolic (congestive) heart failure: Secondary | ICD-10-CM | POA: Insufficient documentation

## 2012-11-25 DIAGNOSIS — R600 Localized edema: Secondary | ICD-10-CM

## 2012-11-25 DIAGNOSIS — E119 Type 2 diabetes mellitus without complications: Secondary | ICD-10-CM

## 2012-11-25 DIAGNOSIS — E1169 Type 2 diabetes mellitus with other specified complication: Secondary | ICD-10-CM

## 2012-11-25 LAB — POCT INR: INR: 2.6

## 2012-11-25 NOTE — Patient Instructions (Signed)
Your physician recommends that you schedule a follow-up appointment in: in 3 mths with Dr Rennis Golden Your physician recommends that you return for lab work in: Drawn today BMP

## 2012-11-25 NOTE — Assessment & Plan Note (Signed)
BMI 40 

## 2012-11-25 NOTE — Assessment & Plan Note (Signed)
currently compensated on Lasix 40mg  BID and Zaroxolyn prn

## 2012-11-25 NOTE — Assessment & Plan Note (Signed)
In NSR 

## 2012-11-25 NOTE — Assessment & Plan Note (Signed)
Followed [facility], recently put on Doxycycline as a OP

## 2012-11-25 NOTE — Assessment & Plan Note (Signed)
INR today.

## 2012-11-25 NOTE — Assessment & Plan Note (Signed)
controlled 

## 2012-11-25 NOTE — Assessment & Plan Note (Signed)
stable °

## 2012-11-25 NOTE — Assessment & Plan Note (Signed)
Last SCr 2.4 on 4/22

## 2012-11-25 NOTE — Progress Notes (Signed)
11/25/2012 [name]   1934/07/16  [id number]  Primary Physicia Alva Garnet., MD Primary Cardiologist: Dr Rennis Golden  HPI:  80 y/o obese female with a history diabetes of chronic diastolic dysfunction, HTN, obesity, and sleep apnea. She was admitted in [place] 2014 with CHF exacerbation. We saw here in April and she had had a bump in her SCr. I saw her 4/21 and adjusted her diuretics. She is on Lasix 40mg  BID and Zaroxolyn prn for wgt gain of 5 lbs or more. Since we saw her last she has done OK from a cardiac stand point. She is follwed at the [facility] for chronic lower extremity venous ulcers and was put on ABs last week for cellulitis flair. She tells me her usual wgt at home is 230. A HHRN is coming out to check on her and she has been provided a scale. She says she is using her C-pap.   Current Outpatient Prescriptions  Medication Sig Dispense Refill  . allopurinol (ZYLOPRIM) 300 MG tablet Take 150 mg by mouth daily.      . cloNIDine (CATAPRES) 0.2 MG tablet 2 (two) times daily.       Marland Kitchen doxycycline (MONODOX) 100 MG capsule Take 100 mg by mouth daily.       . furosemide (LASIX) 80 MG tablet Take 40 mg by mouth 2 (two) times daily.      Marland Kitchen glimepiride (AMARYL) 4 MG tablet Take 4 mg by mouth daily before breakfast.       . HYDROcodone-acetaminophen (NORCO/VICODIN) 5-325 MG per tablet as needed.       . insulin lispro protamine-insulin lispro (HUMALOG 75/25) (75-25) 100 UNIT/ML SUSP Inject 5-10 Units into the skin 2 (two) times daily with a meal. 10 units with breakfast and 5 units at dinner      . isosorbide-hydrALAZINE (BIDIL) 20-37.5 MG per tablet Take 1 tablet by mouth 3 (three) times daily.  90 tablet  5  . metolazone (ZAROXOLYN) 2.5 MG tablet 2.5 mg. Take one tab 3 times a week as needed based on weight- if weight is up 5lbs, take one tablet      . metoprolol succinate (TOPROL-XL) 12.5 mg TB24 Take 1.5 tablets (37.5 mg total) by mouth 2 (two) times daily.  90 tablet  5  . omeprazole  (PRILOSEC) 20 MG capsule Take 20 mg by mouth every morning.       . potassium chloride SA (K-DUR,KLOR-CON) 20 MEQ tablet Take 1 tablet (20 mEq total) by mouth 2 (two) times daily.  60 tablet  0  . senna-docusate (SENOKOT-S) 8.6-50 MG per tablet Take 1 tablet by mouth at bedtime as needed for constipation.  30 tablet  0  . simvastatin (ZOCOR) 10 MG tablet Take 1 tablet (10 mg total) by mouth daily at 6 PM.  30 tablet  0  . warfarin (COUMADIN) 2.5 MG tablet Take 1.25-2.5 mg by mouth daily. 2.5mg /day except take 1.25mg  on MF       No current facility-administered medications for this visit.    No Known Allergies  History   Social History  . Marital Status: Married    Spouse Name: N/A    Number of Children: 2  . Years of Education: N/A   Occupational History  . Retired    Social History Main Topics  . Smoking status: Never Smoker   . Smokeless tobacco: Never Used  . Alcohol Use: Yes  . Drug Use: No  . Sexually Active: No   Other Topics Concern  .  Not on file   Social History Narrative  . No narrative on file     Review of Systems: General: negative for chills, fever, night sweats or weight changes.  Cardiovascular: chronic dyspnea, chronic edema Dermatological: negative for rash Respiratory: negative for cough or wheezing Urologic: negative for hematuria Abdominal: negative for nausea, vomiting, diarrhea, bright red blood per rectum, melena, or hematemesis Neurologic: negative for visual changes, syncope, or dizziness, numbness Rt hand after surgery All other systems reviewed and are otherwise negative except as noted above.    Blood pressure 116/52, pulse 88, height 5\' 4"  (1.626 m), weight 238 lb 12.8 oz (108.319 kg).  General appearance: alert, cooperative, no distress and morbidly obese Neck: no carotid bruit and no JVD Lungs: clear to auscultation bilaterally Heart: regular rate and rhythm and 2/6 systolic murmur Aov and LSB Abdomen: obese Extremities: wrapped  with boot on Rt foot  EKG  EKG: unchanged from previous tracings.  ASSESSMENT AND PLAN:   Acute on chronic diastolic CHF (congestive heart failure), NYHA class 3, 09/25/12 currently compensated on Lasix 40mg  BID and Zaroxolyn prn  Chronic venous insufficiency with LE ulcers, will be followed at wound center Followed at [facility], recently put on Doxycycline as a OP  Diabetes mellitus type 2 in obese Followed by primary MD  Diastolic dysfunction stage 2 EF 60-65%, with outflow track obstruction Jan 2014 stable  CKD (chronic kidney disease) stage 3, GFR 30-59 ml/min Last SCr 2.4 on 4/22  Sleep apnea on c-pap  She says she is compliant with C-pap  Hypertension controlled  Morbid obesity BMI 40  PAF (paroxysmal atrial fibrillation),  In NSR  Long term (current) use of anticoagulants INR today    PLAN  BMP, INR today, F/U Dr Rennis Golden 3mos. She may need renal consult if her SCr remains > 2.0   Livia Tarr KPA-C 11/25/2012 10:35 AM

## 2012-11-25 NOTE — Assessment & Plan Note (Signed)
Followed by primary M.D. 

## 2012-11-25 NOTE — Assessment & Plan Note (Signed)
She says she is compliant with C-pap 

## 2012-11-26 LAB — BASIC METABOLIC PANEL
BUN: 46 mg/dL — ABNORMAL HIGH (ref 6–23)
CO2: 27 mEq/L (ref 19–32)
Calcium: 8.7 mg/dL (ref 8.4–10.5)
Chloride: 103 mEq/L (ref 96–112)
Creat: 1.87 mg/dL — ABNORMAL HIGH (ref 0.50–1.10)
Glucose, Bld: 143 mg/dL — ABNORMAL HIGH (ref 70–99)
Potassium: 3.6 mEq/L (ref 3.5–5.3)
Sodium: 141 mEq/L (ref 135–145)

## 2012-11-28 ENCOUNTER — Other Ambulatory Visit: Payer: Self-pay | Admitting: *Deleted

## 2012-11-28 ENCOUNTER — Telehealth: Payer: Self-pay | Admitting: Internal Medicine

## 2012-11-28 DIAGNOSIS — I480 Paroxysmal atrial fibrillation: Secondary | ICD-10-CM

## 2012-11-28 DIAGNOSIS — I10 Essential (primary) hypertension: Secondary | ICD-10-CM

## 2012-11-28 NOTE — Telephone Encounter (Signed)
Need an order to extend home visit-started back going to the wound care center!

## 2012-11-28 NOTE — Telephone Encounter (Signed)
Returned call.  Left message to fax request to extend care.  Will await fax.

## 2012-12-01 ENCOUNTER — Ambulatory Visit: Payer: Medicare Other | Admitting: Pharmacist Clinician (PhC)/ Clinical Pharmacy Specialist

## 2012-12-01 NOTE — Telephone Encounter (Signed)
Returned call and left message to call back.  Also requested overhead page when calling.

## 2012-12-02 NOTE — Telephone Encounter (Signed)
No fax received.  Call to Clydie Braun who stated she did call office to fax order request.  Clydie Braun informed Dr. Rennis Golden will be notified for further instructions.  Clydie Braun verbalized understanding and stated she can take a verbal order.

## 2012-12-02 NOTE — Telephone Encounter (Signed)
Clydie Braun called back.  Stated pt needs extended order r/t wound care.  Clydie Braun will fax requested order for Dr. Rennis Golden to sign w/ Attn: Joice Lofts.  Will await fax.

## 2012-12-04 ENCOUNTER — Telehealth: Payer: Self-pay | Admitting: *Deleted

## 2012-12-04 NOTE — Telephone Encounter (Signed)
I'm happy to extend care .Marland Kitchen But I don't have a phone note about this. -Dr. Rexene Edison  ----- Message -----  From: Chauncey Reading, RN  Sent: 12/03/2012 10:26 AM  To: Chrystie Nose, MD   Dr. Rennis Golden, Please see phone note on Ms. Chagoya. [facility] needs an order to extend care. Please advise. Thanks. Hospital doctor

## 2012-12-04 NOTE — Telephone Encounter (Signed)
Call to Clydie Braun and informed Dr. Rennis Golden authorized the extension of care.  Clydie Braun verbalized understanding.

## 2012-12-05 ENCOUNTER — Inpatient Hospital Stay (HOSPITAL_COMMUNITY)
Admission: EM | Admit: 2012-12-05 | Discharge: 2012-12-07 | DRG: 101 | Disposition: A | Discharge status: Home / Self Care | Payer: Medicare Other | Attending: Internal Medicine | Admitting: Internal Medicine

## 2012-12-05 ENCOUNTER — Emergency Department (HOSPITAL_COMMUNITY): Payer: Medicare Other

## 2012-12-05 ENCOUNTER — Encounter (HOSPITAL_COMMUNITY): Payer: Self-pay | Admitting: Emergency Medicine

## 2012-12-05 ENCOUNTER — Telehealth: Payer: Self-pay | Admitting: Internal Medicine

## 2012-12-05 DIAGNOSIS — E119 Type 2 diabetes mellitus without complications: Secondary | ICD-10-CM | POA: Diagnosis present

## 2012-12-05 DIAGNOSIS — R600 Localized edema: Secondary | ICD-10-CM

## 2012-12-05 DIAGNOSIS — I129 Hypertensive chronic kidney disease with stage 1 through stage 4 chronic kidney disease, or unspecified chronic kidney disease: Secondary | ICD-10-CM | POA: Diagnosis present

## 2012-12-05 DIAGNOSIS — K219 Gastro-esophageal reflux disease without esophagitis: Secondary | ICD-10-CM | POA: Diagnosis present

## 2012-12-05 DIAGNOSIS — E669 Obesity, unspecified: Secondary | ICD-10-CM

## 2012-12-05 DIAGNOSIS — I5189 Other ill-defined heart diseases: Secondary | ICD-10-CM

## 2012-12-05 DIAGNOSIS — N183 Chronic kidney disease, stage 3 unspecified: Secondary | ICD-10-CM | POA: Diagnosis present

## 2012-12-05 DIAGNOSIS — I5032 Chronic diastolic (congestive) heart failure: Secondary | ICD-10-CM

## 2012-12-05 DIAGNOSIS — I480 Paroxysmal atrial fibrillation: Secondary | ICD-10-CM

## 2012-12-05 DIAGNOSIS — E785 Hyperlipidemia, unspecified: Secondary | ICD-10-CM | POA: Diagnosis present

## 2012-12-05 DIAGNOSIS — Z79899 Other long term (current) drug therapy: Secondary | ICD-10-CM

## 2012-12-05 DIAGNOSIS — I5033 Acute on chronic diastolic (congestive) heart failure: Secondary | ICD-10-CM

## 2012-12-05 DIAGNOSIS — I272 Pulmonary hypertension, unspecified: Secondary | ICD-10-CM

## 2012-12-05 DIAGNOSIS — I509 Heart failure, unspecified: Secondary | ICD-10-CM | POA: Diagnosis present

## 2012-12-05 DIAGNOSIS — I639 Cerebral infarction, unspecified: Secondary | ICD-10-CM

## 2012-12-05 DIAGNOSIS — L97909 Non-pressure chronic ulcer of unspecified part of unspecified lower leg with unspecified severity: Secondary | ICD-10-CM | POA: Diagnosis present

## 2012-12-05 DIAGNOSIS — R569 Unspecified convulsions: Principal | ICD-10-CM | POA: Diagnosis present

## 2012-12-05 DIAGNOSIS — G4733 Obstructive sleep apnea (adult) (pediatric): Secondary | ICD-10-CM | POA: Diagnosis present

## 2012-12-05 DIAGNOSIS — I4891 Unspecified atrial fibrillation: Secondary | ICD-10-CM | POA: Diagnosis present

## 2012-12-05 DIAGNOSIS — Z9989 Dependence on other enabling machines and devices: Secondary | ICD-10-CM | POA: Diagnosis present

## 2012-12-05 DIAGNOSIS — Z794 Long term (current) use of insulin: Secondary | ICD-10-CM

## 2012-12-05 DIAGNOSIS — N179 Acute kidney failure, unspecified: Secondary | ICD-10-CM | POA: Diagnosis present

## 2012-12-05 DIAGNOSIS — I421 Obstructive hypertrophic cardiomyopathy: Secondary | ICD-10-CM

## 2012-12-05 DIAGNOSIS — K5900 Constipation, unspecified: Secondary | ICD-10-CM | POA: Diagnosis present

## 2012-12-05 DIAGNOSIS — N184 Chronic kidney disease, stage 4 (severe): Secondary | ICD-10-CM

## 2012-12-05 DIAGNOSIS — Z8673 Personal history of transient ischemic attack (TIA), and cerebral infarction without residual deficits: Secondary | ICD-10-CM

## 2012-12-05 DIAGNOSIS — Z7901 Long term (current) use of anticoagulants: Secondary | ICD-10-CM

## 2012-12-05 DIAGNOSIS — I359 Nonrheumatic aortic valve disorder, unspecified: Secondary | ICD-10-CM | POA: Diagnosis present

## 2012-12-05 DIAGNOSIS — G4730 Sleep apnea, unspecified: Secondary | ICD-10-CM

## 2012-12-05 DIAGNOSIS — I428 Other cardiomyopathies: Secondary | ICD-10-CM | POA: Diagnosis present

## 2012-12-05 DIAGNOSIS — I872 Venous insufficiency (chronic) (peripheral): Secondary | ICD-10-CM

## 2012-12-05 DIAGNOSIS — I878 Other specified disorders of veins: Secondary | ICD-10-CM

## 2012-12-05 DIAGNOSIS — I351 Nonrheumatic aortic (valve) insufficiency: Secondary | ICD-10-CM

## 2012-12-05 DIAGNOSIS — R404 Transient alteration of awareness: Secondary | ICD-10-CM | POA: Diagnosis present

## 2012-12-05 DIAGNOSIS — I82509 Chronic embolism and thrombosis of unspecified deep veins of unspecified lower extremity: Secondary | ICD-10-CM | POA: Diagnosis present

## 2012-12-05 DIAGNOSIS — I10 Essential (primary) hypertension: Secondary | ICD-10-CM

## 2012-12-05 LAB — CBC WITH DIFFERENTIAL/PLATELET
Eosinophils Relative: 3 % (ref 0–5)
HCT: 37 % (ref 36.0–46.0)
Lymphocytes Relative: 9 % — ABNORMAL LOW (ref 12–46)
Lymphs Abs: 0.8 10*3/uL (ref 0.7–4.0)
MCV: 94.9 fL (ref 78.0–100.0)
Monocytes Absolute: 0.7 10*3/uL (ref 0.1–1.0)
Neutro Abs: 7.4 10*3/uL (ref 1.7–7.7)
RBC: 3.9 MIL/uL (ref 3.87–5.11)
WBC: 9.2 10*3/uL (ref 4.0–10.5)

## 2012-12-05 LAB — HEPATIC FUNCTION PANEL
Albumin: 2.6 g/dL — ABNORMAL LOW (ref 3.5–5.2)
Alkaline Phosphatase: 81 U/L (ref 39–117)
Indirect Bilirubin: 0.4 mg/dL (ref 0.3–0.9)
Total Protein: 6 g/dL (ref 6.0–8.3)

## 2012-12-05 LAB — URINALYSIS, ROUTINE W REFLEX MICROSCOPIC
Hgb urine dipstick: NEGATIVE
Leukocytes, UA: NEGATIVE
Specific Gravity, Urine: 1.013 (ref 1.005–1.030)
Urobilinogen, UA: 0.2 mg/dL (ref 0.0–1.0)

## 2012-12-05 LAB — POCT I-STAT, CHEM 8
BUN: 51 mg/dL — ABNORMAL HIGH (ref 6–23)
Calcium, Ion: 1.07 mmol/L — ABNORMAL LOW (ref 1.13–1.30)
Chloride: 104 mEq/L (ref 96–112)
HCT: 38 % (ref 36.0–46.0)
Potassium: 3.7 mEq/L (ref 3.5–5.1)
Sodium: 140 mEq/L (ref 135–145)

## 2012-12-05 LAB — GLUCOSE, CAPILLARY: Glucose-Capillary: 151 mg/dL — ABNORMAL HIGH (ref 70–99)

## 2012-12-05 LAB — PROTIME-INR: Prothrombin Time: 31.4 seconds — ABNORMAL HIGH (ref 11.6–15.2)

## 2012-12-05 MED ORDER — PHENYTOIN SODIUM EXTENDED 100 MG PO CAPS
400.0000 mg | ORAL_CAPSULE | Freq: Once | ORAL | Status: AC
  Administered 2012-12-05: 400 mg via ORAL
  Filled 2012-12-05: qty 4

## 2012-12-05 NOTE — ED Notes (Signed)
Onset today witnessed by family total of three episodes while patient sitting down tremor bilateral upper extremities with patient staring. EMS reported patient alert answering and following commands appropriate. EMS reported a-fib on EKG.  Patient denies chest pain.

## 2012-12-05 NOTE — ED Provider Notes (Signed)
History     CSN: [id number]  Arrival date & time 12/05/12  1733   First MD Initiated Contact with Patient 12/05/12 1735      Chief Complaint  Patient presents with  . Seizures    (Consider location/radiation/quality/duration/timing/severity/associated sxs/prior treatment) HPI Comments: 79 year old female with a history of diabetes, hypertension, recent stroke in January of this year. She presents with a complaint of seizure-like activity which was witnessed by family members. According to the patient, the paramedics and the family she has had 3 discrete episodes with abnormal mental status and seizure-like activity with shaking of her arms and legs. There was no associated loss of consciousness but no vomiting, no tongue biting, no urinary incontinence. It is unclear whether she had a postictal phase after each of these seizures however at this time she appears to be at her normal mental status and paramedics did not witness any seizure activity or abnormal mental status either. No medications were given prior to arrival, rhythm strips taken at the scene showed normal sinus rhythm with occasional PACs.  The patient denies headache, blurred vision, weakness, numbness, chest pain, shortness of breath. She does admit to having occasional coughing spells which have been gradually worsening over the last several days but denies fevers or shortness of breath at this time. She was seen earlier in the day at the wound care center and had her right lower extremity evaluated, noted that it was improving significantly and was redressed with sterile dressings and topical ointments.  Patient is a 79 y.o. female presenting with seizures. The history is provided by the patient, the EMS personnel and medical records.  Seizures   Past Medical History  Diagnosis Date  . Hypertension   . Venous stasis of lower extremity WEARS TEDS    BLE--  CURRENTLY NO ULCERS/ WOUNDS  . Aortic insufficiency     mild/H&P  04/02/2012  . Type II diabetes mellitus   . OSA on CPAP   . Pulmonary hypertension per last echo 03-28-2012  mod.-severe     per dr wert note this related to L ht pressures from diastolic chf  . Hypertrophic obstructive cardiomyopathy   . Arthritis   . CKD (chronic kidney disease) stage 3, GFR 30-59 ml/min   . Heart murmur   . History of DVT of lower extremity lower right leg nov 2012  . Hyperlipidemia   . History of gout 06-09-2012  per pt stable  . Chronic diastolic CHF (congestive heart failure), NYHA class 3 CARDIOLOGIST- DR HILTY--  LOV OCT 2013--  REQUESTED NOTE, EGK AND STRESS TEST    PER PT ON 06-09-2012  S&S OF CHF AND WT IS DOWN  . H/O hiatal hernia   . GERD (gastroesophageal reflux disease)   . Lower extremity edema   . PAF (paroxysmal atrial fibrillation), in SR on admit 09/25/2012  . Stroke Jan 2014    Rt brain, Nl CA doppler  . TIA (transient ischemic attack) 08/07/2012    2D Echo - EF 60-65, severe concentric hypertrophy in the left ventricle  . Dyspnea on exertion 11/30/2010    Gated Stress Dipyridamole Myocardial Perfusion - post stress EF-63, normal study, no ischemia demonstrated     Past Surgical History  Procedure Laterality Date  . Total knee arthroplasty  1988; 1998 X2    right; bilateral  . Tumor excision  1970's    "off my stomach"  . Cataract extraction w/ intraocular lens implant  ~ 2010    right; "put lens  in; didn't work; had mass on my eye"  . Transthoracic echocardiogram  04-03-2012   DR KENNETH HILTY    LVSF  EF 80-85%/ HYPERTRONIC CARDIOMYOPATHY WITH MOSTLY MID-CAVITARY OBSTRUCTION AND A SMALLER COMPONENT OF LV OUTFLOW TRACT OBSTRUCTION/   EVIDENCE OF SEVERE DECOMPRESATION OF DIASTOLIC HEART FAILURE AND MODERATE TO SEVERE PULMONARY ARTERIAL HYPERTENSION  . Joint replacement  '88 and '98    TOTAL RIGHT KNEE X2  1988  &  1998 (DUE TO JOINT MALFUNCTION)   TOTAL LEFT KNEE 1998  . Carpometacarpel suspension plasty  06/16/2012    Procedure: CARPOMETACARPEL  [facility]) SUSPENSION PLASTY;  Surgeon: Jodi Marble, MD;  Location: [facility];  Service: Orthopedics;  Laterality: Right;  RIGHT THUMB SUSPENSION ARTHROPLASTY.  Marland Kitchen Eye surgery Right ~ 2011    "not a cataract; just a growth" (09/26/2012)  . Cardiac catheterization Bilateral 01/19/2004    NL cors. neg Nuc 2012    Family History  Problem Relation Age of Onset  . Other Mother   . Coronary artery disease Father     sudden death in his 84's  . Diabetes Father   . Cancer Father   . Diabetes Sister     History  Substance Use Topics  . Smoking status: Never Smoker   . Smokeless tobacco: Never Used  . Alcohol Use: Yes    OB History   Grav Para Term Preterm Abortions TAB SAB Ect Mult Living                  Review of Systems  Neurological: Positive for seizures.  All other systems reviewed and are negative.    Allergies  Review of patient's allergies indicates no known allergies.  Home Medications   Current Outpatient Rx  Name  Route  Sig  Dispense  Refill  . allopurinol (ZYLOPRIM) 300 MG tablet   Oral   Take 150 mg by mouth daily.         . cloNIDine (CATAPRES) 0.2 MG tablet   Oral   Take 0.2 mg by mouth 2 (two) times daily.          . furosemide (LASIX) 40 MG tablet   Oral   Take 40 mg by mouth 2 (two) times daily.         Marland Kitchen glimepiride (AMARYL) 4 MG tablet   Oral   Take 4 mg by mouth daily before breakfast.          . insulin lispro protamine-insulin lispro (HUMALOG 75/25) (75-25) 100 UNIT/ML SUSP   Subcutaneous   Inject 5-10 Units into the skin 2 (two) times daily with a meal. 10 units with breakfast and 5 units at dinner         . isosorbide-hydrALAZINE (BIDIL) 20-37.5 MG per tablet   Oral   Take 1 tablet by mouth 3 (three) times daily.         . metolazone (ZAROXOLYN) 2.5 MG tablet      2.5 mg. Take one tab 3 times a week as needed based on weight- if weight is up 5lbs, take one tablet         . metoprolol succinate  (TOPROL-XL) 25 MG 24 hr tablet   Oral   Take 37.5 mg by mouth 2 (two) times daily.         Marland Kitchen omeprazole (PRILOSEC) 20 MG capsule   Oral   Take 20 mg by mouth every morning.          . potassium  chloride SA (K-DUR,KLOR-CON) 20 MEQ tablet   Oral   Take 20 mEq by mouth 2 (two) times daily.         Marland Kitchen senna-docusate (SENOKOT-S) 8.6-50 MG per tablet   Oral   Take 1 tablet by mouth at bedtime as needed for constipation.         . simvastatin (ZOCOR) 10 MG tablet   Oral   Take 10 mg by mouth at bedtime.         Marland Kitchen warfarin (COUMADIN) 2.5 MG tablet   Oral   Take 1.25-2.5 mg by mouth See admin instructions. 2.5mg /day except take 1.25mg  on MF           BP 96/51  Pulse 69  Temp(Src) 98.2 F (36.8 C) (Oral)  Resp 17  SpO2 94%  Physical Exam  Nursing note and vitals reviewed. Constitutional: She appears well-developed and well-nourished. No distress.  HENT:  Head: Normocephalic and atraumatic.  Mouth/Throat: Oropharynx is clear and moist. No oropharyngeal exudate.  No evidence of tongue biting or intraoral trauma  Eyes: Conjunctivae and EOM are normal. Pupils are equal, round, and reactive to light. Right eye exhibits no discharge. Left eye exhibits no discharge. No scleral icterus.  Neck: Normal range of motion. Neck supple. No JVD present. No thyromegaly present.  Cardiovascular: Normal rate, regular rhythm, normal heart sounds and intact distal pulses.  Exam reveals no gallop and no friction rub.   No murmur heard. Pulmonary/Chest: Effort normal and breath sounds normal. No respiratory distress. She has no wheezes. She has no rales.  Abdominal: Soft. Bowel sounds are normal. She exhibits no distension and no mass. There is no tenderness.  Musculoskeletal: Normal range of motion. She exhibits edema ( bila LE edema). She exhibits no tenderness.  Lymphadenopathy:    She has no cervical adenopathy.  Neurological: She is alert. Coordination normal.  Speech is clear, no  facial droop, cranial nerves III through XII are intact, strength is normal in the upper and lower extremities 5 out of 5 in all 4. Normal sensation to light touch, normal coordination finger-nose-finger, memory is intact other than these episodes of seizure-like activity of which she has no memory.  Skin: Skin is warm and dry. No rash noted. No erythema.  Healing wounds to RLE.   Psychiatric: She has a normal mood and affect. Her behavior is normal.    ED Course  Procedures (including critical care time)  Labs Reviewed  CBC WITH DIFFERENTIAL - Abnormal; Notable for the following:    RDW 16.5 (*)    Neutrophils Relative % 81 (*)    Lymphocytes Relative 9 (*)    All other components within normal limits  PROTIME-INR - Abnormal; Notable for the following:    Prothrombin Time 31.4 (*)    INR 3.25 (*)    All other components within normal limits  HEPATIC FUNCTION PANEL - Abnormal; Notable for the following:    Albumin 2.6 (*)    AST 44 (*)    All other components within normal limits  GLUCOSE, CAPILLARY - Abnormal; Notable for the following:    Glucose-Capillary 151 (*)    All other components within normal limits  POCT I-STAT, CHEM 8 - Abnormal; Notable for the following:    BUN 51 (*)    Creatinine, Ser 2.30 (*)    Glucose, Bld 160 (*)    Calcium, Ion 1.07 (*)    All other components within normal limits  URINALYSIS, ROUTINE W REFLEX MICROSCOPIC  Ct Head Wo Contrast  12/05/2012   *RADIOLOGY REPORT*  Clinical Data: Seizure.  CT HEAD WITHOUT CONTRAST  Technique:  Contiguous axial images were obtained from the base of the skull through the vertex without contrast.  Comparison: MRI brain 08/07/2012.  CT head 08/07/2012.  Findings: Old cortical stroke in the right frontotemporal region with encephalomalacia.  Ventricular system normal in size appearance, unchanged.  Mild to moderate age appropriate cortical atrophy, unchanged.   No mass lesion.  No midline shift.  No acute hemorrhage or  hematoma.  No extra-axial fluid collections.  No evidence of acute infarction.  No skull fracture or other focal osseous abnormality involving the skull.  Small mucous retention cysts or polyps in the right maxillary and left ethmoid sinuses.  Remaining visualized paranasal sinuses, bilateral mastoid air cells, and bilateral middle ear cavities well-aerated.  Moderate bilateral carotid siphon atherosclerosis.  IMPRESSION:  1.  No acute intracranial abnormality. 2.  Old right frontotemporal cortical stroke.   Original Report Authenticated By: Hulan Saas, M.D.   Dg Chest Port 1 View  12/05/2012   *RADIOLOGY REPORT*  Clinical Data: Cough.  Shortness of breath.  Current history of hypertension and diabetes.  Prior history of CHF.  PORTABLE CHEST - 1 VIEW 12/05/2012 1808 hours:  Comparison: Portable chest x-ray 09/26/2012.  Two-view chest x-ray 08/07/2012, 04/03/2012, 10/18/2011.  Findings: Cardiac silhouette moderately enlarged but stable, allowing for differences in technique.  Lungs clear. Bronchovascular markings normal.  Pulmonary vascularity normal.  No pneumothorax.  No pleural effusions.  Degenerative changes involving the right shoulder joint.  IMPRESSION: Stable cardiomegaly.  No acute cardiopulmonary disease.   Original Report Authenticated By: Hulan Saas, M.D.     1. Seizure       MDM  Seizure-like activity is witnessed by family members. No seizure-like activity at this time. Vital signs appear normal with a blood pressure of 160/52 and a pulse of 88. EKG shows normal sinus rhythm, occasional ectopy, the patient is on Coumadin and of note the recent MRI from January showed possible hemorrhagic surrounding area of prior infarct. Will need CT scan, labs, evaluation, observation, disposition pending medical course and results.  Seizure precautions initiated   ED ECG REPORT  I personally interpreted this EKG   Date: 12/05/2012   Rate: 78  Rhythm: normal sinus rhythm  QRS Axis:  normal  Intervals: normal  ST/T Wave abnormalities: nonspecific T wave changes  Conduction Disutrbances:none  Narrative Interpretation: Frequent PACs, poor R-wave progression  Old EKG Reviewed: Compared with 09/25/2012, poor R-wave progression persists, ectopy now seen.   Dr. Toniann Fail - will admit - pt seen by Dr. Thad Ranger with neurology - no further seizure in the ED.      Vida Roller, MD 12/05/12 2204

## 2012-12-05 NOTE — Telephone Encounter (Signed)
[name] called to say that she didn't lose any weight last night and she has a cough, also shortness of breath and was told by her [facility] nurse to give [place] a call. She has a 10am appt with the wound center and will be home after that. Please call her at [phone]  Thanks

## 2012-12-05 NOTE — Telephone Encounter (Signed)
Returned call.  Pt c/o cough x 3 days.  Denied taking anything for cough.  Denied CP.  C/o SOB since the cough started and abdominal swelling on R side x 1 month.  Cough is not productive.  Denied fever or chills.  Pt denied weight gain or loss in the last 3 days.  Stated cough feels like a dry tickle.  Denied taking allergy med.  Advised she call pcp and pt stated she did and was told to go to the hospital.  Pt stated she didn't go because she thought she should call here first.  Pt does not sound to be in respiratory distress.  Will consult with Hinda Glatter, PA-C for further instructions.

## 2012-12-05 NOTE — Consult Note (Addendum)
Reason for Consult:New onset seizures Referring Physician: Hyacinth Meeker  CC: Seizure  HPI: [name] is an 79 y.o. female who reports that she was in her usual state of health today.  Went to he bathroom and after she sued the bathroom felt that she was a little confused.  When she went out to join her family was told that  She had a seizure.  She was sitting at the time.  Was noted to have shaking of the arms and stare off in to space with groaning.  She had three of these events.  EMS was called.  Patient was brought to the ED for further evaluation.  Has not bee noted to have a seizure since arrival in the ED by staff but the patient reports that she did have another episode of confusion while going to the CT scanner and did not know where she was.   The patient was last hospitalized in January when she was diagnosed with a stroke and found to have some surrounding hemorrhage.  She reports no deficit from that stroke.    Past Medical History  Diagnosis Date  . Hypertension   . Venous stasis of lower extremity WEARS TEDS    BLE--  CURRENTLY NO ULCERS/ WOUNDS  . Aortic insufficiency     mild/H&P 04/02/2012  . Type II diabetes mellitus   . OSA on CPAP   . Pulmonary hypertension per last echo 03-28-2012  mod.-severe     per dr wert note this related to L ht pressures from diastolic chf  . Hypertrophic obstructive cardiomyopathy   . Arthritis   . CKD (chronic kidney disease) stage 3, GFR 30-59 ml/min   . Heart murmur   . History of DVT of lower extremity lower right leg nov 2012  . Hyperlipidemia   . History of gout 06-09-2012  per pt stable  . Chronic diastolic CHF (congestive heart failure), NYHA class 3 CARDIOLOGIST- DR HILTY--  LOV OCT 2013--  REQUESTED NOTE, EGK AND STRESS TEST    PER PT ON 06-09-2012  S&S OF CHF AND WT IS DOWN  . H/O hiatal hernia   . GERD (gastroesophageal reflux disease)   . Lower extremity edema   . PAF (paroxysmal atrial fibrillation), in SR on admit 09/25/2012  .  Stroke Jan 2014    Rt brain, Nl CA doppler  . TIA (transient ischemic attack) 08/07/2012    2D Echo - EF 60-65, severe concentric hypertrophy in the left ventricle  . Dyspnea on exertion 11/30/2010    Gated Stress Dipyridamole Myocardial Perfusion - post stress EF-63, normal study, no ischemia demonstrated     Past Surgical History  Procedure Laterality Date  . Total knee arthroplasty  1988; 1998 X2    right; bilateral  . Tumor excision  1970's    "off my stomach"  . Cataract extraction w/ intraocular lens implant  ~ 2010    right; "put lens in; didn't work; had mass on my eye"  . Transthoracic echocardiogram  04-03-2012   DR KENNETH HILTY    LVSF  EF 80-85%/ HYPERTRONIC CARDIOMYOPATHY WITH MOSTLY MID-CAVITARY OBSTRUCTION AND A SMALLER COMPONENT OF LV OUTFLOW TRACT OBSTRUCTION/   EVIDENCE OF SEVERE DECOMPRESATION OF DIASTOLIC HEART FAILURE AND MODERATE TO SEVERE PULMONARY ARTERIAL HYPERTENSION  . Joint replacement  '88 and '98    TOTAL RIGHT KNEE X2  1988  &  1998 (DUE TO JOINT MALFUNCTION)   TOTAL LEFT KNEE 1998  . Carpometacarpel suspension plasty  06/16/2012  Procedure: CARPOMETACARPEL [facility]) SUSPENSION PLASTY;  Surgeon: Jodi Marble, MD;  Location: [facility];  Service: Orthopedics;  Laterality: Right;  RIGHT THUMB SUSPENSION ARTHROPLASTY.  Marland Kitchen Eye surgery Right ~ 2011    "not a cataract; just a growth" (09/26/2012)  . Cardiac catheterization Bilateral 01/19/2004    NL cors. neg Nuc 2012    Family History  Problem Relation Age of Onset  . Other Mother   . Coronary artery disease Father     sudden death in his 25's  . Diabetes Father   . Cancer Father   . Diabetes Sister     Social History:  reports that she has never smoked. She has never used smokeless tobacco. She reports that  drinks alcohol. She reports that she does not use illicit drugs.  No Known Allergies  Medications: I have reviewed the patient's current medications. Prior to Admission:  Current  outpatient prescriptions: allopurinol (ZYLOPRIM) 300 MG tablet, Take 150 mg by mouth daily., Disp: , Rfl: ;   cloNIDine (CATAPRES) 0.2 MG tablet, Take 0.2 mg by mouth 2 (two) times daily. , Disp: , Rfl: ;   furosemide (LASIX) 40 MG tablet, Take 40 mg by mouth 2 (two) times daily., Disp: , Rfl: ;   glimepiride (AMARYL) 4 MG tablet, Take 4 mg by mouth daily before breakfast. , Disp: , Rfl:  insulin lispro protamine-insulin lispro (HUMALOG 75/25) (75-25) 100 UNIT/ML SUSP, Inject 5-10 Units into the skin 2 (two) times daily with a meal. 10 units with breakfast and 5 units at dinner, Disp: , Rfl: ;   isosorbide-hydrALAZINE (BIDIL) 20-37.5 MG per tablet, Take 1 tablet by mouth 3 (three) times daily., Disp: , Rfl:  metolazone (ZAROXOLYN) 2.5 MG tablet, 2.5 mg. Take one tab 3 times a week as needed based on weight- if weight is up 5lbs, take one tablet, metoprolol succinate (TOPROL-XL) 25 MG 24 hr tablet, Take 37.5 mg by mouth 2 (two) times daily., Disp: , Rfl: ;   omeprazole (PRILOSEC) 20 MG capsule, Take 20 mg by mouth every morning. , Disp: , Rfl:  potassium chloride SA (K-DUR,KLOR-CON) 20 MEQ tablet, Take 20 mEq by mouth 2 (two) times daily., Disp: , Rfl: ;   senna-docusate (SENOKOT-S) 8.6-50 MG per tablet, Take 1 tablet by mouth at bedtime as needed for constipation., Disp: , Rfl: ;   simvastatin (ZOCOR) 10 MG tablet, Take 10 mg by mouth at bedtime., Disp: , Rfl:  warfarin (COUMADIN) 2.5 MG tablet, Take 1.25-2.5 mg by mouth See admin instructions. 2.5mg /day except take 1.25mg  on MF, Disp: , Rfl:   ROS: History obtained from the patient  General ROS: negative for - chills, fatigue, fever, night sweats, weight gain or weight loss Psychological ROS: negative for - behavioral disorder, hallucinations, memory difficulties, mood swings or suicidal ideation Ophthalmic ROS: negative for - blurry vision, double vision, eye pain or loss of vision ENT ROS: negative for - epistaxis, nasal discharge, oral  lesions, sore throat, tinnitus or vertigo Allergy and Immunology ROS: negative for - hives or itchy/watery eyes Hematological and Lymphatic ROS: negative for - bleeding problems, bruising or swollen lymph nodes Endocrine ROS: negative for - galactorrhea, hair pattern changes, polydipsia/polyuria or temperature intolerance Respiratory ROS: cough Cardiovascular ROS: lower extremity edema  Gastrointestinal ROS: negative for - abdominal pain, diarrhea, hematemesis, nausea/vomiting or stool incontinence Genito-Urinary ROS: decreased urination Musculoskeletal ROS: negative for - joint swelling or muscular weakness Neurological ROS: as noted in HPI Dermatological ROS: negative for rash and skin lesion  changes  Physical Examination: Blood pressure 96/51, pulse 69, temperature 98.2 F (36.8 C), temperature source Oral, resp. rate 17, SpO2 94.00%.  Neurologic Examination Mental Status: Alert, oriented, thought content appropriate.  Speech fluent without evidence of aphasia.  Able to follow 3 step commands without difficulty. Cranial Nerves: II: Discs flat bilaterally; Visual fields grossly normal, pupils equal, round, reactive to light and accommodation III,IV, VI: ptosis not present, extra-ocular motions intact bilaterally V,VII: smile symmetric, facial light touch sensation normal bilaterally VIII: hearing normal bilaterally IX,X: gag reflex present XI: bilateral shoulder shrug XII: midline tongue extension Motor: Right : Upper extremity   5/5    Left:     Upper extremity   5/5  Lower extremity   5/5     Lower extremity   5/5 Tone and bulk:normal tone throughout; no atrophy noted Sensory: Pinprick and light touch decreased in the second the third digits of the right hand and the upper extremity Deep Tendon Reflexes: 1+ with absent AJ's bilaterally Plantars: Right: downgoing   Left: downgoing Cerebellar: normal finger-to-nose Gait: Unable to test CV: pulses palpable in the upper  extremities    Laboratory Studies:   Basic Metabolic Panel:  Recent Labs Lab 12/05/12 1915  NA 140  K 3.7  CL 104  GLUCOSE 160*  BUN 51*  CREATININE 2.30*    Liver Function Tests:  Recent Labs Lab 12/05/12 1855  AST 44*  ALT 27  ALKPHOS 81  BILITOT 0.6  PROT 6.0  ALBUMIN 2.6*   No results found for this basename: LIPASE, AMYLASE,  in the last 168 hours No results found for this basename: AMMONIA,  in the last 168 hours  CBC:  Recent Labs Lab 12/05/12 1855 12/05/12 1915  WBC 9.2  --   NEUTROABS 7.4  --   HGB 12.0 12.9  HCT 37.0 38.0  MCV 94.9  --   PLT 236  --     Cardiac Enzymes: No results found for this basename: CKTOTAL, CKMB, CKMBINDEX, TROPONINI,  in the last 168 hours  BNP: No components found with this basename: POCBNP,   CBG:  Recent Labs Lab 12/05/12 1814  GLUCAP 151*    Microbiology: Results for orders placed during the hospital encounter of 01/04/10  URINE CULTURE     Status: None   Collection Time    01/04/10 10:51 PM      Result Value Range Status   Specimen Description URINE, CLEAN CATCH   Final   Special Requests NONE   Final   Colony Count 85,000 COLONIES/ML   Final   Culture     Final   Value: Multiple bacterial morphotypes present, none predominant. Suggest appropriate recollection if clinically indicated.   Report Status 01/06/2010 FINAL   Final    Coagulation Studies:  Recent Labs  12/05/12 1855  LABPROT 31.4*  INR 3.25*    Urinalysis:  Recent Labs Lab 12/05/12 2054  COLORURINE YELLOW  LABSPEC 1.013  PHURINE 5.0  GLUCOSEU NEGATIVE  HGBUR NEGATIVE  BILIRUBINUR NEGATIVE  KETONESUR NEGATIVE  PROTEINUR NEGATIVE  UROBILINOGEN 0.2  NITRITE NEGATIVE  LEUKOCYTESUR NEGATIVE    Lipid Panel:     Component Value Date/Time   CHOL 111 08/08/2012 0548   TRIG 110 08/08/2012 0548   HDL 30* 08/08/2012 0548   CHOLHDL 3.7 08/08/2012 0548   VLDL 22 08/08/2012 0548   LDLCALC 59 08/08/2012 0548    HgbA1C:  Lab  Results  Component Value Date   HGBA1C 8.1* 08/08/2012  Urine Drug Screen:     Component Value Date/Time   LABOPIA NONE DETECTED 08/07/2012 1330   COCAINSCRNUR NONE DETECTED 08/07/2012 1330   LABBENZ NONE DETECTED 08/07/2012 1330   AMPHETMU NONE DETECTED 08/07/2012 1330   THCU NONE DETECTED 08/07/2012 1330   LABBARB NONE DETECTED 08/07/2012 1330    Alcohol Level: No results found for this basename: ETH,  in the last 168 hours  Other results: EKG: sinus rhythm at 78 bpm  Imaging: Ct Head Wo Contrast  12/05/2012   *RADIOLOGY REPORT*  Clinical Data: Seizure.  CT HEAD WITHOUT CONTRAST  Technique:  Contiguous axial images were obtained from the base of the skull through the vertex without contrast.  Comparison: MRI brain 08/07/2012.  CT head 08/07/2012.  Findings: Old cortical stroke in the right frontotemporal region with encephalomalacia.  Ventricular system normal in size appearance, unchanged.  Mild to moderate age appropriate cortical atrophy, unchanged.   No mass lesion.  No midline shift.  No acute hemorrhage or hematoma.  No extra-axial fluid collections.  No evidence of acute infarction.  No skull fracture or other focal osseous abnormality involving the skull.  Small mucous retention cysts or polyps in the right maxillary and left ethmoid sinuses.  Remaining visualized paranasal sinuses, bilateral mastoid air cells, and bilateral middle ear cavities well-aerated.  Moderate bilateral carotid siphon atherosclerosis.  IMPRESSION:  1.  No acute intracranial abnormality. 2.  Old right frontotemporal cortical stroke.   Original Report Authenticated By: Hulan Saas, M.D.   Dg Chest Port 1 View  12/05/2012   *RADIOLOGY REPORT*  Clinical Data: Cough.  Shortness of breath.  Current history of hypertension and diabetes.  Prior history of CHF.  PORTABLE CHEST - 1 VIEW 12/05/2012 1808 hours:  Comparison: Portable chest x-ray 09/26/2012.  Two-view chest x-ray 08/07/2012, 04/03/2012, 10/18/2011.   Findings: Cardiac silhouette moderately enlarged but stable, allowing for differences in technique.  Lungs clear. Bronchovascular markings normal.  Pulmonary vascularity normal.  No pneumothorax.  No pleural effusions.  Degenerative changes involving the right shoulder joint.  IMPRESSION: Stable cardiomegaly.  No acute cardiopulmonary disease.   Original Report Authenticated By: Hulan Saas, M.D.     Assessment/Plan: 79 year old female presenting with episodes that by description are seizures.  Patient has a history of a cortical infarct on January that may very well be the etiology of her seizures.  Head CT performed today shows no acute changes.  Lab work has been reviewed as well and shows her renal function has been progressively worsening over the past few months.  This will need to be considered as treatment is determined.  Patient is on Warfarin as well.  With history of hemorrhagic infarct in the past and multiplicity of seizures at presentation, anticonvulsant therapy is recommended.    Recommendations: 1.  Seizure precautions 2.  EEG in AM 3.  Dilantin 300mg  daily to start tomorrow night.  Would load tonight orally with 800mg  in two doses of 400mg .  INR is already elevated.  Coumadin and Dilantin use will need to be monitored closely.   4.  Patient unable to drive, operate heavy machinery, perform activities at heights and participate in water activities 5.  MRI of the brain with and without contrast   Thana Farr, MD [facility] [phone] 12/05/2012, 9:47 PM

## 2012-12-05 NOTE — Telephone Encounter (Signed)
Take Zaroxolyn 2.5mg  a day for 3 days, then go back to Zaroxolyn prn for wgt gain >3lbs. Ask her todays wgt.

## 2012-12-05 NOTE — Telephone Encounter (Signed)
Returned call. Left message to call back.

## 2012-12-06 ENCOUNTER — Encounter (HOSPITAL_COMMUNITY): Payer: Self-pay | Admitting: Internal Medicine

## 2012-12-06 ENCOUNTER — Inpatient Hospital Stay (HOSPITAL_COMMUNITY): Payer: Medicare Other

## 2012-12-06 ENCOUNTER — Inpatient Hospital Stay (HOSPITAL_COMMUNITY): Admit: 2012-12-06 | Discharge: 2012-12-06 | Disposition: A | Discharge status: Home / Self Care | Payer: Medicare Other

## 2012-12-06 DIAGNOSIS — R569 Unspecified convulsions: Principal | ICD-10-CM

## 2012-12-06 DIAGNOSIS — E119 Type 2 diabetes mellitus without complications: Secondary | ICD-10-CM

## 2012-12-06 DIAGNOSIS — I6350 Cerebral infarction due to unspecified occlusion or stenosis of unspecified cerebral artery: Secondary | ICD-10-CM

## 2012-12-06 DIAGNOSIS — I5032 Chronic diastolic (congestive) heart failure: Secondary | ICD-10-CM

## 2012-12-06 DIAGNOSIS — E669 Obesity, unspecified: Secondary | ICD-10-CM

## 2012-12-06 LAB — BASIC METABOLIC PANEL
GFR calc non Af Amer: 22 mL/min — ABNORMAL LOW (ref >90)
Glucose, Bld: 161 mg/dL — ABNORMAL HIGH (ref 70–99)
Potassium: 3.8 mEq/L (ref 3.5–5.1)
Sodium: 139 mEq/L (ref 135–145)

## 2012-12-06 LAB — CBC
Hemoglobin: 12 g/dL (ref 12.0–15.0)
MCH: 30.9 pg (ref 26.0–34.0)
RBC: 3.88 MIL/uL (ref 3.87–5.11)
WBC: 9.9 10*3/uL (ref 4.0–10.5)

## 2012-12-06 LAB — GLUCOSE, CAPILLARY: Glucose-Capillary: 99 mg/dL (ref 70–99)

## 2012-12-06 LAB — PROTIME-INR
INR: 3.1 — ABNORMAL HIGH (ref 0.00–1.49)
Prothrombin Time: 30.3 seconds — ABNORMAL HIGH (ref 11.6–15.2)

## 2012-12-06 MED ORDER — PHENYTOIN SODIUM EXTENDED 100 MG PO CAPS
400.0000 mg | ORAL_CAPSULE | Freq: Once | ORAL | Status: AC
  Administered 2012-12-06: 400 mg via ORAL
  Filled 2012-12-06: qty 4

## 2012-12-06 MED ORDER — METOLAZONE 2.5 MG PO TABS
2.5000 mg | ORAL_TABLET | Freq: Every day | ORAL | Status: DC
  Administered 2012-12-06 – 2012-12-07 (×2): 2.5 mg via ORAL
  Filled 2012-12-06 (×2): qty 1

## 2012-12-06 MED ORDER — WARFARIN - PHARMACIST DOSING INPATIENT: Freq: Every day | Status: DC

## 2012-12-06 MED ORDER — INSULIN ASPART PROT & ASPART (70-30 MIX) 100 UNIT/ML ~~LOC~~ SUSP
10.0000 [IU] | Freq: Every day | SUBCUTANEOUS | Status: DC
  Administered 2012-12-06: 10 [IU] via SUBCUTANEOUS
  Filled 2012-12-06: qty 10

## 2012-12-06 MED ORDER — FUROSEMIDE 40 MG PO TABS
40.0000 mg | ORAL_TABLET | Freq: Two times a day (BID) | ORAL | Status: DC
  Administered 2012-12-06 – 2012-12-07 (×2): 40 mg via ORAL
  Filled 2012-12-06 (×5): qty 1

## 2012-12-06 MED ORDER — PHENYTOIN SODIUM EXTENDED 100 MG PO CAPS
300.0000 mg | ORAL_CAPSULE | Freq: Every day | ORAL | Status: DC
  Administered 2012-12-06: 300 mg via ORAL
  Filled 2012-12-06 (×2): qty 3

## 2012-12-06 MED ORDER — SODIUM CHLORIDE 0.9 % IJ SOLN
3.0000 mL | Freq: Two times a day (BID) | INTRAMUSCULAR | Status: DC
  Administered 2012-12-06 (×3): 3 mL via INTRAVENOUS

## 2012-12-06 MED ORDER — ALLOPURINOL 150 MG HALF TABLET
150.0000 mg | ORAL_TABLET | Freq: Every day | ORAL | Status: DC
  Administered 2012-12-06 – 2012-12-07 (×2): 150 mg via ORAL
  Filled 2012-12-06 (×2): qty 1

## 2012-12-06 MED ORDER — ACETAMINOPHEN 325 MG PO TABS: 650.0000 mg | ORAL_TABLET | Freq: Four times a day (QID) | ORAL | Status: DC | PRN

## 2012-12-06 MED ORDER — INSULIN ASPART PROT & ASPART (70-30 MIX) 100 UNIT/ML ~~LOC~~ SUSP
5.0000 [IU] | Freq: Every day | SUBCUTANEOUS | Status: DC
  Administered 2012-12-06: 5 [IU] via SUBCUTANEOUS
  Filled 2012-12-06: qty 10

## 2012-12-06 MED ORDER — ACETAMINOPHEN 650 MG RE SUPP: 650.0000 mg | Freq: Four times a day (QID) | RECTAL | Status: DC | PRN

## 2012-12-06 MED ORDER — SODIUM CHLORIDE 0.9 % IJ SOLN: 3.0000 mL | Freq: Two times a day (BID) | INTRAMUSCULAR | Status: DC

## 2012-12-06 MED ORDER — ONDANSETRON HCL 4 MG PO TABS: 4.0000 mg | ORAL_TABLET | Freq: Four times a day (QID) | ORAL | Status: DC | PRN

## 2012-12-06 MED ORDER — CLONIDINE HCL 0.2 MG PO TABS
0.2000 mg | ORAL_TABLET | Freq: Two times a day (BID) | ORAL | Status: DC
  Administered 2012-12-06 – 2012-12-07 (×4): 0.2 mg via ORAL
  Filled 2012-12-06 (×6): qty 1

## 2012-12-06 MED ORDER — ISOSORB DINITRATE-HYDRALAZINE 20-37.5 MG PO TABS
1.0000 | ORAL_TABLET | Freq: Three times a day (TID) | ORAL | Status: DC
  Administered 2012-12-06 – 2012-12-07 (×3): 1 via ORAL
  Filled 2012-12-06 (×6): qty 1

## 2012-12-06 MED ORDER — SIMVASTATIN 10 MG PO TABS
10.0000 mg | ORAL_TABLET | Freq: Every day | ORAL | Status: DC
  Administered 2012-12-06: 10 mg via ORAL
  Filled 2012-12-06 (×2): qty 1

## 2012-12-06 MED ORDER — BENZONATATE 100 MG PO CAPS
200.0000 mg | ORAL_CAPSULE | Freq: Three times a day (TID) | ORAL | Status: DC | PRN
  Administered 2012-12-06 (×3): 200 mg via ORAL
  Filled 2012-12-06 (×3): qty 2

## 2012-12-06 MED ORDER — PANTOPRAZOLE SODIUM 40 MG PO TBEC
40.0000 mg | DELAYED_RELEASE_TABLET | Freq: Every day | ORAL | Status: DC
  Administered 2012-12-06 – 2012-12-07 (×2): 40 mg via ORAL
  Filled 2012-12-06 (×2): qty 1

## 2012-12-06 MED ORDER — SENNOSIDES-DOCUSATE SODIUM 8.6-50 MG PO TABS: 1.0000 | ORAL_TABLET | Freq: Every evening | ORAL | Status: DC | PRN

## 2012-12-06 MED ORDER — METOPROLOL SUCCINATE 12.5 MG HALF TABLET
37.5000 mg | ORAL_TABLET | Freq: Two times a day (BID) | ORAL | Status: DC
  Administered 2012-12-06 – 2012-12-07 (×3): 37.5 mg via ORAL
  Filled 2012-12-06 (×4): qty 1

## 2012-12-06 MED ORDER — GLIMEPIRIDE 4 MG PO TABS
4.0000 mg | ORAL_TABLET | Freq: Every day | ORAL | Status: DC
  Administered 2012-12-06 – 2012-12-07 (×2): 4 mg via ORAL
  Filled 2012-12-06 (×3): qty 1

## 2012-12-06 MED ORDER — POTASSIUM CHLORIDE CRYS ER 20 MEQ PO TBCR
20.0000 meq | EXTENDED_RELEASE_TABLET | Freq: Two times a day (BID) | ORAL | Status: DC
  Administered 2012-12-06 – 2012-12-07 (×3): 20 meq via ORAL
  Filled 2012-12-06 (×4): qty 1

## 2012-12-06 MED ORDER — ONDANSETRON HCL 4 MG/2ML IJ SOLN: 4.0000 mg | Freq: Four times a day (QID) | INTRAMUSCULAR | Status: DC | PRN

## 2012-12-06 MED ORDER — POTASSIUM CHLORIDE CRYS ER 20 MEQ PO TBCR
20.0000 meq | EXTENDED_RELEASE_TABLET | Freq: Two times a day (BID) | ORAL | Status: DC
  Filled 2012-12-06: qty 1

## 2012-12-06 MED ORDER — INSULIN ASPART 100 UNIT/ML ~~LOC~~ SOLN
0.0000 [IU] | Freq: Three times a day (TID) | SUBCUTANEOUS | Status: DC
  Administered 2012-12-06: 2 [IU] via SUBCUTANEOUS

## 2012-12-06 NOTE — Progress Notes (Signed)
NEURO HOSPITALIST PROGRESS NOTE   SUBJECTIVE:                                                                                                                        No further seizures noted. Has no neurological complains. Tolerating dilantin nicely. EEG showed infrequent, sharply contoured theta slowing left temporal region but no frank epileptiform discharges. MRI revealed no acute abnormality but old infarction in the right middle cerebral artery territory with atrophy and gliosis.    OBJECTIVE:                                                                                                                           Vital signs in last 24 hours: Temp:  [97.3 F (36.3 C)-98.2 F (36.8 C)] 97.3 F (36.3 C) (05/24 0631) Pulse Rate:  [64-80] 64 (05/24 1336) Resp:  [17-26] 18 (05/24 1336) BP: (83-127)/(40-71) 88/60 mmHg (05/24 1336) SpO2:  [73 %-100 %] 100 % (05/24 1336) Weight:  [110.542 kg (243 lb 11.2 oz)] 110.542 kg (243 lb 11.2 oz) (05/24 0500)  Intake/Output from previous day:   Intake/Output this shift:   Nutritional status: Carb Control  Past Medical History  Diagnosis Date  . Hypertension   . Venous stasis of lower extremity WEARS TEDS    BLE--  CURRENTLY NO ULCERS/ WOUNDS  . Aortic insufficiency     mild/H&P 04/02/2012  . Type II diabetes mellitus   . OSA on CPAP   . Pulmonary hypertension per last echo 03-28-2012  mod.-severe     per dr wert note this related to L ht pressures from diastolic chf  . Hypertrophic obstructive cardiomyopathy   . Arthritis   . CKD (chronic kidney disease) stage 3, GFR 30-59 ml/min   . Heart murmur   . History of DVT of lower extremity lower right leg nov 2012  . Hyperlipidemia   . History of gout 06-09-2012  per pt stable  . Chronic diastolic CHF (congestive heart failure), NYHA class 3 CARDIOLOGIST- DR HILTY--  LOV OCT 2013--  REQUESTED NOTE, EGK AND STRESS TEST    PER PT ON 06-09-2012  S&S OF  CHF AND WT IS DOWN  . H/O hiatal hernia   . GERD (gastroesophageal reflux  disease)   . Lower extremity edema   . PAF (paroxysmal atrial fibrillation), in SR on admit 09/25/2012  . Stroke Jan 2014    Rt brain, Nl CA doppler  . TIA (transient ischemic attack) 08/07/2012    2D Echo - EF 60-65, severe concentric hypertrophy in the left ventricle  . Dyspnea on exertion 11/30/2010    Gated Stress Dipyridamole Myocardial Perfusion - post stress EF-63, normal study, no ischemia demonstrated     Neurologic Exam:  Mental Status:  Alert, oriented, thought content appropriate. Speech fluent without evidence of aphasia. Able to follow 3 step commands without difficulty.  Cranial Nerves:  II: Discs flat bilaterally; Visual fields grossly normal, pupils equal, round, reactive to light and accommodation  III,IV, VI: ptosis not present, extra-ocular motions intact bilaterally  V,VII: smile symmetric, facial light touch sensation normal bilaterally  VIII: hearing normal bilaterally  IX,X: gag reflex present  XI: bilateral shoulder shrug  XII: midline tongue extension  Motor:  Right : Upper extremity 5/5 Left: Upper extremity 5/5  Lower extremity 5/5 Lower extremity 5/5  Tone and bulk:normal tone throughout; no atrophy noted  Sensory: Pinprick and light touch decreased in the second the third digits of the right hand and the upper extremity  Deep Tendon Reflexes: 1+ with absent AJ's bilaterally  Plantars:  Right: downgoing Left: downgoing  Cerebellar:  normal finger-to-nose  Gait: Unable to test  CV: pulses palpable in the upper extremities   Lab Results: Lab Results  Component Value Date/Time   CHOL 111 08/08/2012  5:48 AM   Lipid Panel No results found for this basename: CHOL, TRIG, HDL, CHOLHDL, VLDL, LDLCALC,  in the last 72 hours  Studies/Results: Ct Head Wo Contrast  12/05/2012   *RADIOLOGY REPORT*  Clinical Data: Seizure.  CT HEAD WITHOUT CONTRAST  Technique:  Contiguous axial images  were obtained from the base of the skull through the vertex without contrast.  Comparison: MRI brain 08/07/2012.  CT head 08/07/2012.  Findings: Old cortical stroke in the right frontotemporal region with encephalomalacia.  Ventricular system normal in size appearance, unchanged.  Mild to moderate age appropriate cortical atrophy, unchanged.   No mass lesion.  No midline shift.  No acute hemorrhage or hematoma.  No extra-axial fluid collections.  No evidence of acute infarction.  No skull fracture or other focal osseous abnormality involving the skull.  Small mucous retention cysts or polyps in the right maxillary and left ethmoid sinuses.  Remaining visualized paranasal sinuses, bilateral mastoid air cells, and bilateral middle ear cavities well-aerated.  Moderate bilateral carotid siphon atherosclerosis.  IMPRESSION:  1.  No acute intracranial abnormality. 2.  Old right frontotemporal cortical stroke.   Original Report Authenticated By: Hulan Saas, M.D.   Mr Brain Wo Contrast  12/06/2012   *RADIOLOGY REPORT*  Clinical Data: New onset seizure.  Recent stroke.  MRI HEAD WITHOUT CONTRAST  Technique:  Multiplanar, multiecho pulse sequences of the brain and surrounding structures were obtained according to standard protocol without intravenous contrast.  Comparison: Head CT 12/05/2012.  CT and MRI 08/07/2012.  Findings: Diffusion imaging does not show any acute or subacute infarction.  There is old infarction in the right middle cerebral artery territory affecting the upper insula, right frontoparietal region and the right parietal region.  There has been progression to atrophy and encephalomalacia.  There is T2 shine through related to some of the adjacent gliotic brain.  There is no low signal on the ADC map and therefore no acute infarction.  The brainstem and cerebellum are normal.  Elsewhere in the cerebral hemispheres, there is mild generalized atrophy without other areas of ischemic insult.  No  hemorrhage, mass lesion, hydrocephalus or extra-axial collection.  No pituitary mass.  No inflammatory sinus disease.  IMPRESSION: No acute finding.  Old infarction in the right middle cerebral artery territory with atrophy and gliosis.  T2 shine through on diffusion imaging without low signal on the ADC map.   Original Report Authenticated By: Paulina Fusi, M.D.   Dg Chest Port 1 View  12/05/2012   *RADIOLOGY REPORT*  Clinical Data: Cough.  Shortness of breath.  Current history of hypertension and diabetes.  Prior history of CHF.  PORTABLE CHEST - 1 VIEW 12/05/2012 1808 hours:  Comparison: Portable chest x-ray 09/26/2012.  Two-view chest x-ray 08/07/2012, 04/03/2012, 10/18/2011.  Findings: Cardiac silhouette moderately enlarged but stable, allowing for differences in technique.  Lungs clear. Bronchovascular markings normal.  Pulmonary vascularity normal.  No pneumothorax.  No pleural effusions.  Degenerative changes involving the right shoulder joint.  IMPRESSION: Stable cardiomegaly.  No acute cardiopulmonary disease.   Original Report Authenticated By: Hulan Saas, M.D.    MEDICATIONS                                                                                                                       I have reviewed the patient's current medications.  ASSESSMENT/PLAN:                                                                                                           New onset seizure, most likely symptomatic from prior right cortical stroke. MRI showed no acute abnormality and EEG is consistent with focal neuronal dysfunction left temporal region. Hesitant to use keppra due to renal impairment. Can continue dilantin with close outpatient monitoring of serum levels as well as INR due to notable pharmacological interaction with coumadin. She can be discharge in the morning. Call neurology with questions or concerns.      Wyatt Portela, MD [facility] [phone]  12/06/2012, 4:24 PM

## 2012-12-06 NOTE — Progress Notes (Signed)
12/06/12 0130  BiPAP/CPAP/SIPAP  BiPAP/CPAP/SIPAP Pt Type Adult  Mask Type Nasal mask  Mask Size Medium  IPAP (I Max = 20)  EPAP (E Min = 7)  Oxygen Percent 21 %  BiPAP/CPAP/SIPAP CPAP  Patient Home Equipment No  Auto Titrate Yes  BiPAP/CPAP /SiPAP Vitals  Pulse Rate 77  Resp 20  Patient placed on CPAP Auto Mode with Max pressure 20 and Min pressure 7. She was fitted with a medium nasal mask which she tolerates very well. Vital signs are normal at this time. RT will continue to monitor.

## 2012-12-06 NOTE — H&P (Addendum)
[facility] History and Physical  KAYDENSE RIZO ZOX:[id number] DOB: [birth year] DOA: 12/05/2012  Referring physician: ER physician. PCP: Alva Garnet., MD   Chief Complaint: Seizures.  HPI: [name] is a 79 y.o. female who has had a history of CVA this January was brought to the ER after patient's family noticed that patient had at least 3 back-to-back seizures at her house. Patient states that she was sitting with her family members when suddenly she started having repeated seizure episodes which she does not recall. The ER physician had discussed with the EMS who provided the history of seizures. Each seizure lasted less than a minute and was tonic-clonic and patient lost consciousness. In the ER patient was found to be nonfocal and alert awake oriented x3. CT head did not show an acute. And at this time neurologist has been consulted and patient has been started Dilantin. Patient will be admitted for further management.  Review of Systems: As presented in the history of presenting illness, rest negative.  Past Medical History  Diagnosis Date  . Hypertension   . Venous stasis of lower extremity WEARS TEDS    BLE--  CURRENTLY NO ULCERS/ WOUNDS  . Aortic insufficiency     mild/H&P 04/02/2012  . Type II diabetes mellitus   . OSA on CPAP   . Pulmonary hypertension per last echo 03-28-2012  mod.-severe     per dr wert note this related to L ht pressures from diastolic chf  . Hypertrophic obstructive cardiomyopathy   . Arthritis   . CKD (chronic kidney disease) stage 3, GFR 30-59 ml/min   . Heart murmur   . History of DVT of lower extremity lower right leg nov 2012  . Hyperlipidemia   . History of gout 06-09-2012  per pt stable  . Chronic diastolic CHF (congestive heart failure), NYHA class 3 CARDIOLOGIST- DR HILTY--  LOV OCT 2013--  REQUESTED NOTE, EGK AND STRESS TEST    PER PT ON 06-09-2012  S&S OF CHF AND WT IS DOWN  . H/O hiatal hernia   . GERD (gastroesophageal  reflux disease)   . Lower extremity edema   . PAF (paroxysmal atrial fibrillation), in SR on admit 09/25/2012  . Stroke Jan 2014    Rt brain, Nl CA doppler  . TIA (transient ischemic attack) 08/07/2012    2D Echo - EF 60-65, severe concentric hypertrophy in the left ventricle  . Dyspnea on exertion 11/30/2010    Gated Stress Dipyridamole Myocardial Perfusion - post stress EF-63, normal study, no ischemia demonstrated    Past Surgical History  Procedure Laterality Date  . Total knee arthroplasty  1988; 1998 X2    right; bilateral  . Tumor excision  1970's    "off my stomach"  . Cataract extraction w/ intraocular lens implant  ~ 2010    right; "put lens in; didn't work; had mass on my eye"  . Transthoracic echocardiogram  04-03-2012   DR KENNETH HILTY    LVSF  EF 80-85%/ HYPERTRONIC CARDIOMYOPATHY WITH MOSTLY MID-CAVITARY OBSTRUCTION AND A SMALLER COMPONENT OF LV OUTFLOW TRACT OBSTRUCTION/   EVIDENCE OF SEVERE DECOMPRESATION OF DIASTOLIC HEART FAILURE AND MODERATE TO SEVERE PULMONARY ARTERIAL HYPERTENSION  . Joint replacement  '88 and '98    TOTAL RIGHT KNEE X2  1988  &  1998 (DUE TO JOINT MALFUNCTION)   TOTAL LEFT KNEE 1998  . Carpometacarpel suspension plasty  06/16/2012    Procedure: CARPOMETACARPEL [facility]) SUSPENSION PLASTY;  Surgeon: Jodi Marble, MD;  Location: [facility];  Service: Orthopedics;  Laterality: Right;  RIGHT THUMB SUSPENSION ARTHROPLASTY.  Marland Kitchen Eye surgery Right ~ 2011    "not a cataract; just a growth" (09/26/2012)  . Cardiac catheterization Bilateral 01/19/2004    NL cors. neg Nuc 2012   Social History:  reports that she has never smoked. She has never used smokeless tobacco. She reports that  drinks alcohol. She reports that she does not use illicit drugs. Lives at home. where does patient live-- Can do ADLs. Can patient participate in ADLs?  No Known Allergies  Family History  Problem Relation Age of Onset  . Other Mother   . Coronary artery disease  Father     sudden death in his 54's  . Diabetes Father   . Cancer Father   . Diabetes Sister       Prior to Admission medications   Medication Sig Start Date End Date Taking? Authorizing Provider  allopurinol (ZYLOPRIM) 300 MG tablet Take 150 mg by mouth daily.   Yes Historical Provider, MD  cloNIDine (CATAPRES) 0.2 MG tablet Take 0.2 mg by mouth 2 (two) times daily.  09/21/12  Yes Historical Provider, MD  furosemide (LASIX) 40 MG tablet Take 40 mg by mouth 2 (two) times daily.   Yes Historical Provider, MD  glimepiride (AMARYL) 4 MG tablet Take 4 mg by mouth daily before breakfast.    Yes Historical Provider, MD  insulin lispro protamine-insulin lispro (HUMALOG 75/25) (75-25) 100 UNIT/ML SUSP Inject 5-10 Units into the skin 2 (two) times daily with a meal. 10 units with breakfast and 5 units at dinner   Yes Historical Provider, MD  isosorbide-hydrALAZINE (BIDIL) 20-37.5 MG per tablet Take 1 tablet by mouth 3 (three) times daily.   Yes Historical Provider, MD  metolazone (ZAROXOLYN) 2.5 MG tablet 2.5 mg. Take one tab 3 times a week as needed based on weight- if weight is up 5lbs, take one tablet 11/13/12  Yes Historical Provider, MD  metoprolol succinate (TOPROL-XL) 25 MG 24 hr tablet Take 37.5 mg by mouth 2 (two) times daily.   Yes Historical Provider, MD  omeprazole (PRILOSEC) 20 MG capsule Take 20 mg by mouth every morning.    Yes Historical Provider, MD  potassium chloride SA (K-DUR,KLOR-CON) 20 MEQ tablet Take 20 mEq by mouth 2 (two) times daily.   Yes Historical Provider, MD  senna-docusate (SENOKOT-S) 8.6-50 MG per tablet Take 1 tablet by mouth at bedtime as needed for constipation.   Yes Historical Provider, MD  simvastatin (ZOCOR) 10 MG tablet Take 10 mg by mouth at bedtime.   Yes Historical Provider, MD  warfarin (COUMADIN) 2.5 MG tablet Take 1.25-2.5 mg by mouth See admin instructions. 2.5mg /day except take 1.25mg  on MF   Yes Historical Provider, MD   Physical Exam: Filed Vitals:    12/05/12 2315 12/05/12 2330 12/05/12 2345 12/06/12 0014  BP: 99/51 83/60 95/57  111/45  Pulse:    74  Temp:      TempSrc:      Resp: 24 20 25    SpO2:    96%     General:  Well-developed well-nourished.  Eyes: Anicteric panel.  ENT: No discharge from ears eyes nose mouth.  Neck: No mass felt.  Cardiovascular: S1-S2 heard.  Respiratory: No rhonchi or crepitations.  Abdomen: Soft nontender bowel sounds present.  Skin: Chronic skin ulceration on the right leg.  Musculoskeletal: Chronic lower extremity edema.  Psychiatric: Appears normal.  Neurologic: Alert and oriented to time place and  person. Moves all extremities.  Labs on Admission:  Basic Metabolic Panel:  Recent Labs Lab 12/05/12 1915  NA 140  K 3.7  CL 104  GLUCOSE 160*  BUN 51*  CREATININE 2.30*   Liver Function Tests:  Recent Labs Lab 12/05/12 1855  AST 44*  ALT 27  ALKPHOS 81  BILITOT 0.6  PROT 6.0  ALBUMIN 2.6*   No results found for this basename: LIPASE, AMYLASE,  in the last 168 hours No results found for this basename: AMMONIA,  in the last 168 hours CBC:  Recent Labs Lab 12/05/12 1855 12/05/12 1915  WBC 9.2  --   NEUTROABS 7.4  --   HGB 12.0 12.9  HCT 37.0 38.0  MCV 94.9  --   PLT 236  --    Cardiac Enzymes: No results found for this basename: CKTOTAL, CKMB, CKMBINDEX, TROPONINI,  in the last 168 hours  BNP (last 3 results)  Recent Labs  09/25/12 1530 09/28/12 0605 09/30/12 0535  PROBNP 12391.0* 14130.0* 11933.0*   CBG:  Recent Labs Lab 12/05/12 1814  GLUCAP 151*    Radiological Exams on Admission: Ct Head Wo Contrast  12/05/2012   *RADIOLOGY REPORT*  Clinical Data: Seizure.  CT HEAD WITHOUT CONTRAST  Technique:  Contiguous axial images were obtained from the base of the skull through the vertex without contrast.  Comparison: MRI brain 08/07/2012.  CT head 08/07/2012.  Findings: Old cortical stroke in the right frontotemporal region with encephalomalacia.   Ventricular system normal in size appearance, unchanged.  Mild to moderate age appropriate cortical atrophy, unchanged.   No mass lesion.  No midline shift.  No acute hemorrhage or hematoma.  No extra-axial fluid collections.  No evidence of acute infarction.  No skull fracture or other focal osseous abnormality involving the skull.  Small mucous retention cysts or polyps in the right maxillary and left ethmoid sinuses.  Remaining visualized paranasal sinuses, bilateral mastoid air cells, and bilateral middle ear cavities well-aerated.  Moderate bilateral carotid siphon atherosclerosis.  IMPRESSION:  1.  No acute intracranial abnormality. 2.  Old right frontotemporal cortical stroke.   Original Report Authenticated By: Hulan Saas, M.D.   Dg Chest Port 1 View  12/05/2012   *RADIOLOGY REPORT*  Clinical Data: Cough.  Shortness of breath.  Current history of hypertension and diabetes.  Prior history of CHF.  PORTABLE CHEST - 1 VIEW 12/05/2012 1808 hours:  Comparison: Portable chest x-ray 09/26/2012.  Two-view chest x-ray 08/07/2012, 04/03/2012, 10/18/2011.  Findings: Cardiac silhouette moderately enlarged but stable, allowing for differences in technique.  Lungs clear. Bronchovascular markings normal.  Pulmonary vascularity normal.  No pneumothorax.  No pleural effusions.  Degenerative changes involving the right shoulder joint.  IMPRESSION: Stable cardiomegaly.  No acute cardiopulmonary disease.   Original Report Authenticated By: Hulan Saas, M.D.    EKG: Independently reviewed. Normal sinus rhythm.  Assessment/Plan Principal Problem:   Seizure Active Problems:   PAF (paroxysmal atrial fibrillation),    Type II diabetes mellitus   OSA on CPAP   Stroke   1. Seizures - patient has been already seen by neurologist and started on Dilantin. MRI brain and EEG. Further recommendations per neurologist. 2. ARF on CKD - closely follow intake output and metabolic panel. Patient did receive some fluids  in the ER. At this time I'm holding off further fluids and if creatinine does not improve then may have to hold diuretics. 3. Chronic diastolic heart failure - presently looks compensated. 4. Diabetes mellitus type 2 -  continue medications. 5. Paroxysmal atrial fibrillation presently in sinus rhythm and rate controlled - Coumadin per pharmacy. 6. History of CVA - on Coumadin. 7. OSA on CPAP for respiratory. 8. Hyperlipidemia - continue statins. 9. Chronic skin ulcer of the lower extremity - wound team consult.    Code Status: Full code.  Family Communication: None.  Disposition Plan: Admit to inpatient.    KAKRAKANDY,ARSHAD N. [facility] Pager [phone].  If 7PM-7AM, please contact night-coverage [email or web address] Password [facility] 12/06/2012, 12:21 AM

## 2012-12-06 NOTE — Progress Notes (Signed)
ANTICOAGULATION CONSULT NOTE - Follow Up Consult  Pharmacy Consult for Coumadin Indication: Hx DVT/ AFIB  No Known Allergies Patient Measurements: Height: 5\' 6"  (167.6 cm) Weight: 243 lb 11.2 oz (110.542 kg) IBW/kg (Calculated) : 59.3 Vital Signs: Temp: 97.3 F (36.3 C) (05/24 0631) Temp src: Oral (05/24 0631) BP: 88/60 mmHg (05/24 1336) Pulse Rate: 64 (05/24 1336)  Labs:  Recent Labs  12/05/12 1855 12/05/12 1915 12/06/12 0635  HGB 12.0 12.9 12.0  HCT 37.0 38.0 36.5  PLT 236  --  224  LABPROT 31.4*  --  30.3*  INR 3.25*  --  3.10*  CREATININE  --  2.30* 2.08*    Estimated Creatinine Clearance: 28.5 ml/min (by C-G formula based on Cr of 2.08).  Assessment: 30 YOF s/p seizure and fall on chronic Coumadin. INR today is 3.10 (down from 3.25). CT Head- no acute changes. CBC wnl. No bleeding reported. Home regimen was 1.25mg  Monday and Friday and 2.5mg  all other days. Reports of shaking episodes in unit today- on dilantin therapy. MRI of brain pending.   Goal of Therapy:  INR 2-3   Plan:  1. Continue to hold Coumadin tonight. 2. Follow-up PT/INR in AM- likely ok to restart.   Fayne Norrie 12/06/2012,3:07 PM

## 2012-12-06 NOTE — Progress Notes (Signed)
Patient states "I had an una boot down in the ED, but left it on the bed side table." ED notified, and they denied there being any belongings left in room at the time of patients transfer. Patient states husband did not take boot home with him. Will follow up with husband in the morning to ensure he did not take boot home.

## 2012-12-06 NOTE — Progress Notes (Signed)
EEg Completed

## 2012-12-06 NOTE — Progress Notes (Signed)
Patient seen and examined. Admitted earlier today for 3 distinct seizure episodes. Has been seen by neurology and has been started on Dilantin. EEG/MRI are pending at this time. Also has acute on chronic kidney disease. Is improving with IV fluids. If no further seizure activity an MRI within normal limits, consider discharge home in the morning.  Peggye Pitt, MD [facility] Pager: [phone]

## 2012-12-06 NOTE — Consult Note (Addendum)
EEG report.  Brief clinical history: 79 year old female presenting with episodes that by description are seizures. Patient has a history of a cortical infarct on January . No prior history of frank epileptic seizures.  Technique: this is a 17 channel routine scalp EEG performed at the bedside with bipolar and monopolar montages arranged in accordance to the international 10/20 system of electrode placement. One channel was dedicated to EKG recording.  The study was performed during wakefulness, drowsiness, and stage 2 sleep. No activating procedures employed during the test.  Description:In the wakeful state, the best background consisted of a medium amplitude, posterior dominant, well sustained, symmetric and reactive 8 Hz rhythm. Drowsiness demonstrated dropout of the alpha rhythm. Stage 2 sleep showed symmetric and synchronous sleep spindles without intermixed epileptiform discharges. No focal or generalized epileptiform discharges noted.  Infrequent, intermittent, sharply contoured left temporal theta slowing seen.  EKG showed sinus rhythm.  Impression: this is an abnormal awake and asleep EEG with findings suggestive of focal neuronal dysfunction involving the left temporal region. Clinical correlation is advised.  Wyatt Portela, MD

## 2012-12-06 NOTE — Progress Notes (Addendum)
ANTICOAGULATION CONSULT NOTE - Initial Consult  Pharmacy Consult for coumadin Indication: hx dvt / afib  No Known Allergies  Patient Measurements:   Heparin Dosing Weight:   Vital Signs: Temp: 98.2 F (36.8 C) (05/23 1749) Temp src: Oral (05/23 1749) BP: 111/45 mmHg (05/24 0014) Pulse Rate: 74 (05/24 0014)  Labs:  Recent Labs  12/05/12 1855 12/05/12 1915  HGB 12.0 12.9  HCT 37.0 38.0  PLT 236  --   LABPROT 31.4*  --   INR 3.25*  --   CREATININE  --  2.30*    The CrCl is unknown because both a height and weight (above a minimum accepted value) are required for this calculation.   Medical History: Past Medical History  Diagnosis Date  . Hypertension   . Venous stasis of lower extremity WEARS TEDS    BLE--  CURRENTLY NO ULCERS/ WOUNDS  . Aortic insufficiency     mild/H&P 04/02/2012  . Type II diabetes mellitus   . OSA on CPAP   . Pulmonary hypertension per last echo 03-28-2012  mod.-severe     per dr wert note this related to L ht pressures from diastolic chf  . Hypertrophic obstructive cardiomyopathy   . Arthritis   . CKD (chronic kidney disease) stage 3, GFR 30-59 ml/min   . Heart murmur   . History of DVT of lower extremity lower right leg nov 2012  . Hyperlipidemia   . History of gout 06-09-2012  per pt stable  . Chronic diastolic CHF (congestive heart failure), NYHA class 3 CARDIOLOGIST- DR HILTY--  LOV OCT 2013--  REQUESTED NOTE, EGK AND STRESS TEST    PER PT ON 06-09-2012  S&S OF CHF AND WT IS DOWN  . H/O hiatal hernia   . GERD (gastroesophageal reflux disease)   . Lower extremity edema   . PAF (paroxysmal atrial fibrillation), in SR on admit 09/25/2012  . Stroke Jan 2014    Rt brain, Nl CA doppler  . TIA (transient ischemic attack) 08/07/2012    2D Echo - EF 60-65, severe concentric hypertrophy in the left ventricle  . Dyspnea on exertion 11/30/2010    Gated Stress Dipyridamole Myocardial Perfusion - post stress EF-63, normal study, no ischemia  demonstrated     Medications:  Prescriptions prior to admission  Medication Sig Dispense Refill  . allopurinol (ZYLOPRIM) 300 MG tablet Take 150 mg by mouth daily.      . cloNIDine (CATAPRES) 0.2 MG tablet Take 0.2 mg by mouth 2 (two) times daily.       . furosemide (LASIX) 40 MG tablet Take 40 mg by mouth 2 (two) times daily.      Marland Kitchen glimepiride (AMARYL) 4 MG tablet Take 4 mg by mouth daily before breakfast.       . insulin lispro protamine-insulin lispro (HUMALOG 75/25) (75-25) 100 UNIT/ML SUSP Inject 5-10 Units into the skin 2 (two) times daily with a meal. 10 units with breakfast and 5 units at dinner      . isosorbide-hydrALAZINE (BIDIL) 20-37.5 MG per tablet Take 1 tablet by mouth 3 (three) times daily.      . metolazone (ZAROXOLYN) 2.5 MG tablet 2.5 mg. Take one tab 3 times a week as needed based on weight- if weight is up 5lbs, take one tablet      . metoprolol succinate (TOPROL-XL) 25 MG 24 hr tablet Take 37.5 mg by mouth 2 (two) times daily.      Marland Kitchen omeprazole (PRILOSEC) 20 MG capsule Take  20 mg by mouth every morning.       . potassium chloride SA (K-DUR,KLOR-CON) 20 MEQ tablet Take 20 mEq by mouth 2 (two) times daily.      Marland Kitchen senna-docusate (SENOKOT-S) 8.6-50 MG per tablet Take 1 tablet by mouth at bedtime as needed for constipation.      . simvastatin (ZOCOR) 10 MG tablet Take 10 mg by mouth at bedtime.      Marland Kitchen warfarin (COUMADIN) 2.5 MG tablet Take 1.25-2.5 mg by mouth See admin instructions. 2.5mg /day except take 1.25mg  on MF        Assessment: 79 yo with seizure and fall in her bathroom 5/23. INR is >3. CT revealed no acute changes. Coumadin to continue. Dilantin load po tonight of 800 mg then 300 daily.  Goal of Therapy:  INR 2-3 Monitor platelets by anticoagulation protocol: Yes   Plan:  Hold coumadin today and f/u inr 5/25 in am and determine further dosing. Check dilantin level after 5-7 days.   Janice Coffin 12/06/2012,12:56 AM

## 2012-12-06 NOTE — Progress Notes (Signed)
Pts husband in visiting, at 1300 came out of the room, and stated that since about 1245, patient had had 3 "shaking episodes", and she hadn't wanted to tell anyone.  When checked with patient she stated she didn't feel like, like she had lost something, didn't know what but she was searching for it. Dr. Ardyth Harps made aware, no new orders at this time.  Jeralyn Nolden Charity fundraiser. SCRN

## 2012-12-07 DIAGNOSIS — I509 Heart failure, unspecified: Secondary | ICD-10-CM

## 2012-12-07 DIAGNOSIS — N184 Chronic kidney disease, stage 4 (severe): Secondary | ICD-10-CM

## 2012-12-07 DIAGNOSIS — I5033 Acute on chronic diastolic (congestive) heart failure: Secondary | ICD-10-CM

## 2012-12-07 LAB — CBC
Hemoglobin: 12.6 g/dL (ref 12.0–15.0)
MCH: 30.3 pg (ref 26.0–34.0)
MCV: 94 fL (ref 78.0–100.0)
Platelets: 259 10*3/uL (ref 150–400)
RBC: 4.16 MIL/uL (ref 3.87–5.11)

## 2012-12-07 LAB — BASIC METABOLIC PANEL
CO2: 26 mEq/L (ref 19–32)
Calcium: 9.4 mg/dL (ref 8.4–10.5)
Glucose, Bld: 99 mg/dL (ref 70–99)
Sodium: 138 mEq/L (ref 135–145)

## 2012-12-07 LAB — GLUCOSE, CAPILLARY
Glucose-Capillary: 93 mg/dL (ref 70–99)
Glucose-Capillary: 94 mg/dL (ref 70–99)

## 2012-12-07 MED ORDER — PHENYTOIN SODIUM EXTENDED 300 MG PO CAPS: 300.0000 mg | ORAL_CAPSULE | Freq: Every day | ORAL | Status: DC

## 2012-12-07 NOTE — Discharge Summary (Signed)
Physician Discharge Summary  [name]:[id number] DOB: 1934-06-23 DOA: 12/05/2012  PCP: Alva Garnet., MD  Admit date: 12/05/2012 Discharge date: 12/07/2012  Time spent: Greater than 30 minutes  Recommendations for Outpatient Follow-up:  -Advised to follow up with her PCP in 4 days. -Will need close INR monitoring given known interaction of dilantin with coumadin.   Discharge Diagnoses:  Principal Problem:   Seizure Active Problems:   PAF (paroxysmal atrial fibrillation),    Type II diabetes mellitus   OSA on CPAP   Stroke   Chronic kidney disease (CKD), stage IV (severe)   Discharge Condition: Stable and improved  Filed Weights   12/06/12 0130 12/06/12 0500  Weight: 110.542 kg (243 lb 11.2 oz) 110.542 kg (243 lb 11.2 oz)    History of present illness:  Patient is a 79 y.o. female who has had a history of CVA this January was brought to the ER after patient's family noticed that patient had at least 3 back-to-back seizures at her house. Patient states that she was sitting with her family members when suddenly she started having repeated seizure episodes which she does not recall. The ER physician had discussed with the EMS who provided the history of seizures. Each seizure lasted less than a minute and was tonic-clonic and patient lost consciousness. In the ER patient was found to be nonfocal and alert awake oriented x3. CT head did not show an acute. And at this time neurologist has been consulted and patient has been started Dilantin. Patient will be admitted for further management.   Hospital Course:   New Onset Seizures -Likely from prior right cortical stroke. -MRI negative for repeat event. -Has been started on dilantin by neurology as keppra would be somewhat contraindicated with her CKD. -Patient's husband reported what he thought was seizure activity yesterday afternoon. I have reviewed her camera video from yesterday, and she did not have any seizure-type  activity and in fact remained conscious and coherent thruout these episodes. Husband states this was different than what happened at home and those sound like true seizures.  CKD Stage III-IV -Cr at baseline. -Needs OP referral to renal.  Atrial Fibrillation -Rate controlled. -Continue coumadin. -Will need close INR followup given she is now on dilantin.  Chronic Diastolic CHF -Clinically compensated.  DM -Well controlled. -Continue home meds.  Chronic Skin Ulcer of RLE -Continue follow up with wound care MD. -Has a unnaboot.  Procedures:  EEG   Consultations:  Neurology, Dr. Leroy Kennedy  Discharge Instructions      Discharge Orders   Future Appointments Provider Department Dept Phone   12/23/2012 9:00 AM Phillips Hay, RPH-CPP [facility] [place] [phone]   Future Orders Complete By Expires     Diet - low sodium heart healthy  As directed     Discontinue IV  As directed     Increase activity slowly  As directed         Medication List    TAKE these medications       allopurinol 300 MG tablet  Commonly known as:  ZYLOPRIM  Take 150 mg by mouth daily.     cloNIDine 0.2 MG tablet  Commonly known as:  CATAPRES  Take 0.2 mg by mouth 2 (two) times daily.     furosemide 40 MG tablet  Commonly known as:  LASIX  Take 40 mg by mouth 2 (two) times daily.     glimepiride 4 MG tablet  Commonly known as:  AMARYL  Take 4 mg by mouth daily before breakfast.     insulin lispro protamine-lispro (75-25) 100 UNIT/ML Susp  Commonly known as:  HUMALOG 75/25  Inject 5-10 Units into the skin 2 (two) times daily with a meal. 10 units with breakfast and 5 units at dinner     isosorbide-hydrALAZINE 20-37.5 MG per tablet  Commonly known as:  BIDIL  Take 1 tablet by mouth 3 (three) times daily.     metolazone 2.5 MG tablet  Commonly known as:  ZAROXOLYN  2.5 mg. Take one tab 3 times a week as needed based on weight- if weight is up 5lbs,  take one tablet     metoprolol succinate 25 MG 24 hr tablet  Commonly known as:  TOPROL-XL  Take 37.5 mg by mouth 2 (two) times daily.     omeprazole 20 MG capsule  Commonly known as:  PRILOSEC  Take 20 mg by mouth every morning.     phenytoin 300 MG ER capsule  Commonly known as:  DILANTIN  Take 1 capsule (300 mg total) by mouth at bedtime.     potassium chloride SA 20 MEQ tablet  Commonly known as:  K-DUR,KLOR-CON  Take 20 mEq by mouth 2 (two) times daily.     senna-docusate 8.6-50 MG per tablet  Commonly known as:  Senokot-S  Take 1 tablet by mouth at bedtime as needed for constipation.     simvastatin 10 MG tablet  Commonly known as:  ZOCOR  Take 10 mg by mouth at bedtime.     warfarin 2.5 MG tablet  Commonly known as:  COUMADIN  Take 1.25-2.5 mg by mouth See admin instructions. 2.5mg /day except take 1.25mg  on MF       No Known Allergies Follow-up Information   Follow up with Alva Garnet., MD. Schedule an appointment as soon as possible for a visit in 4 days.   Contact information:   [address] [place] [address] [phone]        The results of significant diagnostics from this hospitalization (including imaging, microbiology, ancillary and laboratory) are listed below for reference.    Significant Diagnostic Studies: Ct Head Wo Contrast  12/05/2012   *RADIOLOGY REPORT*  Clinical Data: Seizure.  CT HEAD WITHOUT CONTRAST  Technique:  Contiguous axial images were obtained from the base of the skull through the vertex without contrast.  Comparison: MRI brain 08/07/2012.  CT head 08/07/2012.  Findings: Old cortical stroke in the right frontotemporal region with encephalomalacia.  Ventricular system normal in size appearance, unchanged.  Mild to moderate age appropriate cortical atrophy, unchanged.   No mass lesion.  No midline shift.  No acute hemorrhage or hematoma.  No extra-axial fluid collections.  No evidence of acute infarction.  No  skull fracture or other focal osseous abnormality involving the skull.  Small mucous retention cysts or polyps in the right maxillary and left ethmoid sinuses.  Remaining visualized paranasal sinuses, bilateral mastoid air cells, and bilateral middle ear cavities well-aerated.  Moderate bilateral carotid siphon atherosclerosis.  IMPRESSION:  1.  No acute intracranial abnormality. 2.  Old right frontotemporal cortical stroke.   Original Report Authenticated By: Hulan Saas, M.D.   Mr Brain Wo Contrast  12/06/2012   *RADIOLOGY REPORT*  Clinical Data: New onset seizure.  Recent stroke.  MRI HEAD WITHOUT CONTRAST  Technique:  Multiplanar, multiecho pulse sequences of the brain and surrounding structures were obtained according to standard protocol without intravenous contrast.  Comparison: Head CT 12/05/2012.  CT and MRI 08/07/2012.  Findings: Diffusion imaging does not show any acute or subacute infarction.  There is old infarction in the right middle cerebral artery territory affecting the upper insula, right frontoparietal region and the right parietal region.  There has been progression to atrophy and encephalomalacia.  There is T2 shine through related to some of the adjacent gliotic brain.  There is no low signal on the ADC map and therefore no acute infarction.  The brainstem and cerebellum are normal.  Elsewhere in the cerebral hemispheres, there is mild generalized atrophy without other areas of ischemic insult.  No hemorrhage, mass lesion, hydrocephalus or extra-axial collection.  No pituitary mass.  No inflammatory sinus disease.  IMPRESSION: No acute finding.  Old infarction in the right middle cerebral artery territory with atrophy and gliosis.  T2 shine through on diffusion imaging without low signal on the ADC map.   Original Report Authenticated By: Paulina Fusi, M.D.   Dg Chest Port 1 View  12/05/2012   *RADIOLOGY REPORT*  Clinical Data: Cough.  Shortness of breath.  Current history of  hypertension and diabetes.  Prior history of CHF.  PORTABLE CHEST - 1 VIEW 12/05/2012 1808 hours:  Comparison: Portable chest x-ray 09/26/2012.  Two-view chest x-ray 08/07/2012, 04/03/2012, 10/18/2011.  Findings: Cardiac silhouette moderately enlarged but stable, allowing for differences in technique.  Lungs clear. Bronchovascular markings normal.  Pulmonary vascularity normal.  No pneumothorax.  No pleural effusions.  Degenerative changes involving the right shoulder joint.  IMPRESSION: Stable cardiomegaly.  No acute cardiopulmonary disease.   Original Report Authenticated By: Hulan Saas, M.D.    Microbiology: No results found for this or any previous visit (from the past 240 hour(s)).   Labs: Basic Metabolic Panel:  Recent Labs Lab 12/05/12 1915 12/06/12 0635 12/07/12 0354  NA 140 139 138  K 3.7 3.8 4.1  CL 104 103 100  CO2  --  23 26  GLUCOSE 160* 161* 99  BUN 51* 56* 65*  CREATININE 2.30* 2.08* 2.36*  CALCIUM  --  9.2 9.4   Liver Function Tests:  Recent Labs Lab 12/05/12 1855  AST 44*  ALT 27  ALKPHOS 81  BILITOT 0.6  PROT 6.0  ALBUMIN 2.6*   No results found for this basename: LIPASE, AMYLASE,  in the last 168 hours No results found for this basename: AMMONIA,  in the last 168 hours CBC:  Recent Labs Lab 12/05/12 1855 12/05/12 1915 12/06/12 0635 12/07/12 0354  WBC 9.2  --  9.9 13.1*  NEUTROABS 7.4  --   --   --   HGB 12.0 12.9 12.0 12.6  HCT 37.0 38.0 36.5 39.1  MCV 94.9  --  94.1 94.0  PLT 236  --  224 259   Cardiac Enzymes: No results found for this basename: CKTOTAL, CKMB, CKMBINDEX, TROPONINI,  in the last 168 hours BNP: BNP (last 3 results)  Recent Labs  09/25/12 1530 09/28/12 0605 09/30/12 0535  PROBNP 12391.0* 14130.0* 11933.0*   CBG:  Recent Labs Lab 12/06/12 1114 12/06/12 1623 12/06/12 2138 12/07/12 0658 12/07/12 1103  GLUCAP 105* 99 97 93 94       Signed:  HERNANDEZ ACOSTA,ESTELA  [facility] Pager:  [phone] 12/07/2012, 12:43 PM

## 2012-12-09 NOTE — Telephone Encounter (Signed)
Call to pt and informed per L. Kilroy, PA-C.  Pt verbalized understanding and agreed w/ plan.  Pt weight was 249 today.  Pt also stated she had a seizure on Friday and was in the hospital.  Stated she was discharged yesterday.  Pt agreed to call back w/ weight in 2-3 days to inform if changed.

## 2012-12-19 ENCOUNTER — Encounter (HOSPITAL_BASED_OUTPATIENT_CLINIC_OR_DEPARTMENT_OTHER): Payer: Medicare Other | Attending: General Surgery

## 2012-12-19 DIAGNOSIS — L97909 Non-pressure chronic ulcer of unspecified part of unspecified lower leg with unspecified severity: Secondary | ICD-10-CM | POA: Insufficient documentation

## 2012-12-23 ENCOUNTER — Ambulatory Visit (INDEPENDENT_AMBULATORY_CARE_PROVIDER_SITE_OTHER): Payer: Medicare Other | Admitting: Pharmacist Clinician (PhC)/ Clinical Pharmacy Specialist

## 2012-12-23 VITALS — BP 112/56 | HR 76

## 2012-12-23 DIAGNOSIS — I639 Cerebral infarction, unspecified: Secondary | ICD-10-CM

## 2012-12-23 DIAGNOSIS — I480 Paroxysmal atrial fibrillation: Secondary | ICD-10-CM

## 2012-12-23 DIAGNOSIS — I4891 Unspecified atrial fibrillation: Secondary | ICD-10-CM

## 2012-12-23 DIAGNOSIS — Z7901 Long term (current) use of anticoagulants: Secondary | ICD-10-CM

## 2012-12-23 DIAGNOSIS — I6350 Cerebral infarction due to unspecified occlusion or stenosis of unspecified cerebral artery: Secondary | ICD-10-CM

## 2012-12-23 LAB — POCT INR: INR: 1.2

## 2012-12-30 ENCOUNTER — Emergency Department (HOSPITAL_COMMUNITY): Payer: Medicare Other

## 2012-12-30 ENCOUNTER — Telehealth: Payer: Self-pay | Admitting: Internal Medicine

## 2012-12-30 ENCOUNTER — Encounter (HOSPITAL_COMMUNITY): Payer: Self-pay | Admitting: *Deleted

## 2012-12-30 ENCOUNTER — Inpatient Hospital Stay (HOSPITAL_COMMUNITY)
Admission: EM | Admit: 2012-12-30 | Discharge: 2013-01-23 | DRG: 264 | Disposition: A | Discharge status: Skilled Nursing Facility | Payer: Medicare Other | Attending: Internal Medicine | Admitting: Internal Medicine

## 2012-12-30 DIAGNOSIS — I10 Essential (primary) hypertension: Secondary | ICD-10-CM

## 2012-12-30 DIAGNOSIS — N183 Chronic kidney disease, stage 3 unspecified: Secondary | ICD-10-CM

## 2012-12-30 DIAGNOSIS — E669 Obesity, unspecified: Secondary | ICD-10-CM

## 2012-12-30 DIAGNOSIS — Z96659 Presence of unspecified artificial knee joint: Secondary | ICD-10-CM

## 2012-12-30 DIAGNOSIS — J4489 Other specified chronic obstructive pulmonary disease: Secondary | ICD-10-CM | POA: Diagnosis present

## 2012-12-30 DIAGNOSIS — Z6841 Body Mass Index (BMI) 40.0 and over, adult: Secondary | ICD-10-CM

## 2012-12-30 DIAGNOSIS — I351 Nonrheumatic aortic (valve) insufficiency: Secondary | ICD-10-CM

## 2012-12-30 DIAGNOSIS — E875 Hyperkalemia: Secondary | ICD-10-CM | POA: Diagnosis not present

## 2012-12-30 DIAGNOSIS — R569 Unspecified convulsions: Secondary | ICD-10-CM

## 2012-12-30 DIAGNOSIS — I639 Cerebral infarction, unspecified: Secondary | ICD-10-CM

## 2012-12-30 DIAGNOSIS — M7989 Other specified soft tissue disorders: Secondary | ICD-10-CM | POA: Diagnosis not present

## 2012-12-30 DIAGNOSIS — Y9210 Unspecified residential institution as the place of occurrence of the external cause: Secondary | ICD-10-CM | POA: Diagnosis not present

## 2012-12-30 DIAGNOSIS — I959 Hypotension, unspecified: Secondary | ICD-10-CM

## 2012-12-30 DIAGNOSIS — I5189 Other ill-defined heart diseases: Secondary | ICD-10-CM

## 2012-12-30 DIAGNOSIS — J449 Chronic obstructive pulmonary disease, unspecified: Secondary | ICD-10-CM | POA: Diagnosis present

## 2012-12-30 DIAGNOSIS — R600 Localized edema: Secondary | ICD-10-CM

## 2012-12-30 DIAGNOSIS — I130 Hypertensive heart and chronic kidney disease with heart failure and stage 1 through stage 4 chronic kidney disease, or unspecified chronic kidney disease: Secondary | ICD-10-CM

## 2012-12-30 DIAGNOSIS — G4733 Obstructive sleep apnea (adult) (pediatric): Secondary | ICD-10-CM

## 2012-12-30 DIAGNOSIS — I4891 Unspecified atrial fibrillation: Secondary | ICD-10-CM | POA: Diagnosis present

## 2012-12-30 DIAGNOSIS — Z8673 Personal history of transient ischemic attack (TIA), and cerebral infarction without residual deficits: Secondary | ICD-10-CM

## 2012-12-30 DIAGNOSIS — I421 Obstructive hypertrophic cardiomyopathy: Secondary | ICD-10-CM

## 2012-12-30 DIAGNOSIS — N184 Chronic kidney disease, stage 4 (severe): Secondary | ICD-10-CM

## 2012-12-30 DIAGNOSIS — E8809 Other disorders of plasma-protein metabolism, not elsewhere classified: Secondary | ICD-10-CM | POA: Diagnosis present

## 2012-12-30 DIAGNOSIS — I359 Nonrheumatic aortic valve disorder, unspecified: Secondary | ICD-10-CM | POA: Diagnosis present

## 2012-12-30 DIAGNOSIS — G4730 Sleep apnea, unspecified: Secondary | ICD-10-CM

## 2012-12-30 DIAGNOSIS — E785 Hyperlipidemia, unspecified: Secondary | ICD-10-CM

## 2012-12-30 DIAGNOSIS — I5031 Acute diastolic (congestive) heart failure: Secondary | ICD-10-CM

## 2012-12-30 DIAGNOSIS — I2510 Atherosclerotic heart disease of native coronary artery without angina pectoris: Secondary | ICD-10-CM | POA: Diagnosis present

## 2012-12-30 DIAGNOSIS — N179 Acute kidney failure, unspecified: Secondary | ICD-10-CM

## 2012-12-30 DIAGNOSIS — D62 Acute posthemorrhagic anemia: Secondary | ICD-10-CM

## 2012-12-30 DIAGNOSIS — I480 Paroxysmal atrial fibrillation: Secondary | ICD-10-CM

## 2012-12-30 DIAGNOSIS — M109 Gout, unspecified: Secondary | ICD-10-CM | POA: Diagnosis present

## 2012-12-30 DIAGNOSIS — N189 Chronic kidney disease, unspecified: Secondary | ICD-10-CM

## 2012-12-30 DIAGNOSIS — I2789 Other specified pulmonary heart diseases: Secondary | ICD-10-CM | POA: Diagnosis present

## 2012-12-30 DIAGNOSIS — I4729 Other ventricular tachycardia: Secondary | ICD-10-CM | POA: Diagnosis not present

## 2012-12-30 DIAGNOSIS — Z794 Long term (current) use of insulin: Secondary | ICD-10-CM

## 2012-12-30 DIAGNOSIS — R571 Hypovolemic shock: Secondary | ICD-10-CM

## 2012-12-30 DIAGNOSIS — I272 Pulmonary hypertension, unspecified: Secondary | ICD-10-CM

## 2012-12-30 DIAGNOSIS — I878 Other specified disorders of veins: Secondary | ICD-10-CM

## 2012-12-30 DIAGNOSIS — N2581 Secondary hyperparathyroidism of renal origin: Secondary | ICD-10-CM | POA: Diagnosis present

## 2012-12-30 DIAGNOSIS — I132 Hypertensive heart and chronic kidney disease with heart failure and with stage 5 chronic kidney disease, or end stage renal disease: Secondary | ICD-10-CM

## 2012-12-30 DIAGNOSIS — Z961 Presence of intraocular lens: Secondary | ICD-10-CM

## 2012-12-30 DIAGNOSIS — R570 Cardiogenic shock: Secondary | ICD-10-CM

## 2012-12-30 DIAGNOSIS — Z9849 Cataract extraction status, unspecified eye: Secondary | ICD-10-CM

## 2012-12-30 DIAGNOSIS — G40909 Epilepsy, unspecified, not intractable, without status epilepticus: Secondary | ICD-10-CM | POA: Diagnosis present

## 2012-12-30 DIAGNOSIS — I5032 Chronic diastolic (congestive) heart failure: Secondary | ICD-10-CM

## 2012-12-30 DIAGNOSIS — Y832 Surgical operation with anastomosis, bypass or graft as the cause of abnormal reaction of the patient, or of later complication, without mention of misadventure at the time of the procedure: Secondary | ICD-10-CM | POA: Diagnosis not present

## 2012-12-30 DIAGNOSIS — I872 Venous insufficiency (chronic) (peripheral): Secondary | ICD-10-CM

## 2012-12-30 DIAGNOSIS — Z7901 Long term (current) use of anticoagulants: Secondary | ICD-10-CM

## 2012-12-30 DIAGNOSIS — D696 Thrombocytopenia, unspecified: Secondary | ICD-10-CM | POA: Diagnosis not present

## 2012-12-30 DIAGNOSIS — E119 Type 2 diabetes mellitus without complications: Secondary | ICD-10-CM

## 2012-12-30 DIAGNOSIS — Z86718 Personal history of other venous thrombosis and embolism: Secondary | ICD-10-CM

## 2012-12-30 DIAGNOSIS — K219 Gastro-esophageal reflux disease without esophagitis: Secondary | ICD-10-CM | POA: Diagnosis present

## 2012-12-30 DIAGNOSIS — IMO0001 Reserved for inherently not codable concepts without codable children: Principal | ICD-10-CM | POA: Diagnosis present

## 2012-12-30 DIAGNOSIS — N186 End stage renal disease: Secondary | ICD-10-CM | POA: Diagnosis present

## 2012-12-30 DIAGNOSIS — L97909 Non-pressure chronic ulcer of unspecified part of unspecified lower leg with unspecified severity: Secondary | ICD-10-CM | POA: Diagnosis present

## 2012-12-30 DIAGNOSIS — Z8249 Family history of ischemic heart disease and other diseases of the circulatory system: Secondary | ICD-10-CM

## 2012-12-30 DIAGNOSIS — I472 Ventricular tachycardia, unspecified: Secondary | ICD-10-CM | POA: Diagnosis not present

## 2012-12-30 DIAGNOSIS — I1311 Hypertensive heart and chronic kidney disease without heart failure, with stage 5 chronic kidney disease, or end stage renal disease: Secondary | ICD-10-CM

## 2012-12-30 DIAGNOSIS — Z833 Family history of diabetes mellitus: Secondary | ICD-10-CM

## 2012-12-30 DIAGNOSIS — I509 Heart failure, unspecified: Secondary | ICD-10-CM | POA: Diagnosis present

## 2012-12-30 DIAGNOSIS — I1310 Hypertensive heart and chronic kidney disease without heart failure, with stage 1 through stage 4 chronic kidney disease, or unspecified chronic kidney disease: Secondary | ICD-10-CM | POA: Diagnosis present

## 2012-12-30 DIAGNOSIS — IMO0002 Reserved for concepts with insufficient information to code with codable children: Secondary | ICD-10-CM | POA: Diagnosis not present

## 2012-12-30 DIAGNOSIS — I5033 Acute on chronic diastolic (congestive) heart failure: Secondary | ICD-10-CM

## 2012-12-30 DIAGNOSIS — T148XXA Other injury of unspecified body region, initial encounter: Secondary | ICD-10-CM | POA: Diagnosis not present

## 2012-12-30 DIAGNOSIS — E1169 Type 2 diabetes mellitus with other specified complication: Secondary | ICD-10-CM

## 2012-12-30 DIAGNOSIS — L02419 Cutaneous abscess of limb, unspecified: Secondary | ICD-10-CM | POA: Diagnosis present

## 2012-12-30 DIAGNOSIS — Z79899 Other long term (current) drug therapy: Secondary | ICD-10-CM

## 2012-12-30 HISTORY — DX: Other injury of unspecified body region, initial encounter: T14.8XXA

## 2012-12-30 LAB — CBC WITH DIFFERENTIAL/PLATELET
Basophils Absolute: 0 10*3/uL (ref 0.0–0.1)
Lymphocytes Relative: 16 % (ref 12–46)
Neutro Abs: 6.4 10*3/uL (ref 1.7–7.7)
Neutrophils Relative %: 74 % (ref 43–77)
Platelets: 205 10*3/uL (ref 150–400)
RDW: 16.4 % — ABNORMAL HIGH (ref 11.5–15.5)
WBC: 8.6 10*3/uL (ref 4.0–10.5)

## 2012-12-30 LAB — MRSA PCR SCREENING: MRSA by PCR: NEGATIVE

## 2012-12-30 LAB — BASIC METABOLIC PANEL
Calcium: 9.4 mg/dL (ref 8.4–10.5)
Chloride: 95 mEq/L — ABNORMAL LOW (ref 96–112)
Creatinine, Ser: 3.18 mg/dL — ABNORMAL HIGH (ref 0.50–1.10)
GFR calc Af Amer: 15 mL/min — ABNORMAL LOW (ref >90)

## 2012-12-30 LAB — PHENYTOIN LEVEL, TOTAL: Phenytoin Lvl: <2.5 ug/mL — ABNORMAL LOW (ref 10.0–20.0)

## 2012-12-30 LAB — PROTIME-INR: Prothrombin Time: 29.8 seconds — ABNORMAL HIGH (ref 11.6–15.2)

## 2012-12-30 LAB — PRO B NATRIURETIC PEPTIDE: Pro B Natriuretic peptide (BNP): 22049 pg/mL — ABNORMAL HIGH (ref 0–450)

## 2012-12-30 MED ORDER — INSULIN ASPART PROT & ASPART (70-30 MIX) 100 UNIT/ML ~~LOC~~ SUSP: 5.0000 [IU] | Freq: Two times a day (BID) | SUBCUTANEOUS | Status: DC

## 2012-12-30 MED ORDER — SIMVASTATIN 10 MG PO TABS
10.0000 mg | ORAL_TABLET | Freq: Every day | ORAL | Status: DC
  Administered 2012-12-31 – 2013-01-22 (×24): 10 mg via ORAL
  Filled 2012-12-30 (×25): qty 1

## 2012-12-30 MED ORDER — SODIUM CHLORIDE 0.9 % IV SOLN
250.0000 mL | INTRAVENOUS | Status: DC | PRN
  Administered 2012-12-31: 250 mL via INTRAVENOUS

## 2012-12-30 MED ORDER — CLONIDINE HCL 0.2 MG PO TABS
0.2000 mg | ORAL_TABLET | Freq: Every day | ORAL | Status: DC
  Administered 2012-12-31: 0.2 mg via ORAL
  Filled 2012-12-30: qty 1

## 2012-12-30 MED ORDER — WARFARIN 1.25 MG HALF TABLET: 1.2500 mg | ORAL_TABLET | ORAL | Status: DC

## 2012-12-30 MED ORDER — WARFARIN 1.25 MG HALF TABLET: 1.2500 mg | ORAL_TABLET | Freq: Every evening | ORAL | Status: DC

## 2012-12-30 MED ORDER — ALLOPURINOL 150 MG HALF TABLET
150.0000 mg | ORAL_TABLET | Freq: Every day | ORAL | Status: DC
  Administered 2012-12-31 – 2013-01-23 (×22): 150 mg via ORAL
  Filled 2012-12-30 (×24): qty 1

## 2012-12-30 MED ORDER — DOPAMINE-DEXTROSE 3.2-5 MG/ML-% IV SOLN
2.0000 ug/kg/min | INTRAVENOUS | Status: DC
  Administered 2012-12-30: 2 ug/kg/min via INTRAVENOUS
  Filled 2012-12-30: qty 250

## 2012-12-30 MED ORDER — FUROSEMIDE 10 MG/ML IJ SOLN
10.0000 mg/h | INTRAVENOUS | Status: DC
  Administered 2012-12-31 – 2013-01-01 (×2): 10 mg/h via INTRAVENOUS
  Filled 2012-12-30 (×3): qty 25

## 2012-12-30 MED ORDER — WARFARIN - PHYSICIAN DOSING INPATIENT: Freq: Every day | Status: DC

## 2012-12-30 MED ORDER — FUROSEMIDE 10 MG/ML IJ SOLN
120.0000 mg | Freq: Once | INTRAVENOUS | Status: AC
  Administered 2012-12-30: 120 mg via INTRAVENOUS
  Filled 2012-12-30: qty 12

## 2012-12-30 MED ORDER — ACETAMINOPHEN 325 MG PO TABS
650.0000 mg | ORAL_TABLET | ORAL | Status: DC | PRN
  Filled 2012-12-30: qty 2

## 2012-12-30 MED ORDER — PHENYTOIN SODIUM EXTENDED 100 MG PO CAPS
300.0000 mg | ORAL_CAPSULE | Freq: Every day | ORAL | Status: DC
  Administered 2012-12-31 – 2013-01-22 (×24): 300 mg via ORAL
  Filled 2012-12-30 (×25): qty 3

## 2012-12-30 MED ORDER — ALPRAZOLAM 0.25 MG PO TABS
0.2500 mg | ORAL_TABLET | Freq: Two times a day (BID) | ORAL | Status: DC | PRN
  Administered 2013-01-19: 0.25 mg via ORAL
  Filled 2012-12-30: qty 1

## 2012-12-30 MED ORDER — INSULIN ASPART PROT & ASPART (70-30 MIX) 100 UNIT/ML ~~LOC~~ SUSP
10.0000 [IU] | Freq: Every day | SUBCUTANEOUS | Status: DC
  Administered 2012-12-31 – 2013-01-22 (×18): 10 [IU] via SUBCUTANEOUS
  Filled 2012-12-30 (×3): qty 10

## 2012-12-30 MED ORDER — WARFARIN SODIUM 2.5 MG PO TABS
2.5000 mg | ORAL_TABLET | ORAL | Status: DC
  Filled 2012-12-30: qty 1

## 2012-12-30 MED ORDER — SODIUM CHLORIDE 0.9 % IJ SOLN
3.0000 mL | Freq: Two times a day (BID) | INTRAMUSCULAR | Status: DC
  Administered 2012-12-30 – 2013-01-04 (×11): 3 mL via INTRAVENOUS
  Administered 2013-01-05: 10 mL via INTRAVENOUS
  Administered 2013-01-06 – 2013-01-08 (×6): 3 mL via INTRAVENOUS
  Administered 2013-01-09: 11:00:00 via INTRAVENOUS
  Administered 2013-01-12 – 2013-01-18 (×10): 3 mL via INTRAVENOUS

## 2012-12-30 MED ORDER — POTASSIUM CHLORIDE CRYS ER 20 MEQ PO TBCR
20.0000 meq | EXTENDED_RELEASE_TABLET | Freq: Two times a day (BID) | ORAL | Status: DC
  Administered 2012-12-31 – 2013-01-01 (×5): 20 meq via ORAL
  Filled 2012-12-30 (×7): qty 1

## 2012-12-30 MED ORDER — SULFAMETHOXAZOLE-TMP DS 800-160 MG PO TABS
1.0000 | ORAL_TABLET | Freq: Two times a day (BID) | ORAL | Status: DC
  Administered 2012-12-31: 1 via ORAL
  Filled 2012-12-30 (×3): qty 1

## 2012-12-30 MED ORDER — INSULIN ASPART PROT & ASPART (70-30 MIX) 100 UNIT/ML ~~LOC~~ SUSP
5.0000 [IU] | Freq: Every day | SUBCUTANEOUS | Status: DC
  Administered 2012-12-31 – 2013-01-18 (×14): 5 [IU] via SUBCUTANEOUS
  Filled 2012-12-30 (×3): qty 10

## 2012-12-30 MED ORDER — ONDANSETRON HCL 4 MG/2ML IJ SOLN
4.0000 mg | Freq: Four times a day (QID) | INTRAMUSCULAR | Status: DC | PRN
  Administered 2013-01-14: 4 mg via INTRAVENOUS
  Filled 2012-12-30: qty 2

## 2012-12-30 MED ORDER — ZOLPIDEM TARTRATE 5 MG PO TABS
5.0000 mg | ORAL_TABLET | Freq: Every evening | ORAL | Status: DC | PRN
  Administered 2013-01-18 – 2013-01-22 (×3): 5 mg via ORAL
  Filled 2012-12-30 (×3): qty 1

## 2012-12-30 MED ORDER — SODIUM CHLORIDE 0.9 % IJ SOLN: 3.0000 mL | INTRAMUSCULAR | Status: DC | PRN

## 2012-12-30 MED ORDER — GLIMEPIRIDE 4 MG PO TABS
4.0000 mg | ORAL_TABLET | Freq: Every day | ORAL | Status: DC
  Administered 2012-12-31 – 2013-01-01 (×2): 4 mg via ORAL
  Filled 2012-12-30 (×3): qty 1

## 2012-12-30 MED ORDER — PANTOPRAZOLE SODIUM 40 MG PO TBEC
40.0000 mg | DELAYED_RELEASE_TABLET | Freq: Every day | ORAL | Status: DC
  Administered 2012-12-31 – 2013-01-23 (×23): 40 mg via ORAL
  Filled 2012-12-30 (×23): qty 1

## 2012-12-30 MED ORDER — SENNOSIDES-DOCUSATE SODIUM 8.6-50 MG PO TABS
1.0000 | ORAL_TABLET | Freq: Every evening | ORAL | Status: DC | PRN
  Administered 2013-01-05 – 2013-01-21 (×2): 1 via ORAL
  Filled 2012-12-30 (×3): qty 1

## 2012-12-30 NOTE — ED Notes (Signed)
Per EMS- pt has pitting edema, SOB, generalized weakness. Pt was seen at PCP and was noted to be 80/44 initially. 90/50 now. A&Ox4. Denies pain. NSR.

## 2012-12-30 NOTE — Telephone Encounter (Signed)
They have outstanding orders from 12-05-12-Need them asap!Please fax to [phone]!

## 2012-12-30 NOTE — Telephone Encounter (Signed)
[employer] returned your call about [name]

## 2012-12-30 NOTE — Progress Notes (Signed)
Unit CM UR Completed by [facility] ED CM  W. Kinzly Pierrelouis RN  

## 2012-12-30 NOTE — ED Provider Notes (Signed)
History     CSN: [id number]  Arrival date & time 12/30/12  1639   First MD Initiated Contact with Patient 12/30/12 1656      Chief Complaint  Patient presents with  . Shortness of Breath    (Consider location/radiation/quality/duration/timing/severity/associated sxs/prior treatment) Patient is a 79 y.o. female presenting with shortness of breath.  Shortness of Breath Severity:  Moderate Onset quality:  Gradual Duration:  3 days Timing:  Constant Progression:  Worsening Chronicity:  Chronic Context: not URI   Associated symptoms: cough and sputum production   Associated symptoms: no abdominal pain, no chest pain, no fever, no hemoptysis, no neck pain, no PND, no rash, no sore throat and no vomiting   Cough:    Cough characteristics:  Productive   Sputum characteristics:  White   Severity:  Moderate   Past Medical History  Diagnosis Date  . Hypertension   . Venous stasis of lower extremity WEARS TEDS    BLE--  CURRENTLY NO ULCERS/ WOUNDS  . Aortic insufficiency     mild/H&P 04/02/2012  . Type II diabetes mellitus   . OSA on CPAP   . Pulmonary hypertension per last echo 03-28-2012  mod.-severe     per dr wert note this related to L ht pressures from diastolic chf  . Hypertrophic obstructive cardiomyopathy(425.11)   . Arthritis   . CKD (chronic kidney disease) stage 3, GFR 30-59 ml/min   . Heart murmur   . History of DVT of lower extremity lower right leg nov 2012  . Hyperlipidemia   . History of gout 06-09-2012  per pt stable  . Chronic diastolic CHF (congestive heart failure), NYHA class 3 CARDIOLOGIST- DR HILTY--  LOV OCT 2013--  REQUESTED NOTE, EGK AND STRESS TEST    PER PT ON 06-09-2012  S&S OF CHF AND WT IS DOWN  . H/O hiatal hernia   . GERD (gastroesophageal reflux disease)   . Lower extremity edema   . PAF (paroxysmal atrial fibrillation), in SR on admit 09/25/2012  . Stroke Jan 2014    Rt brain, Nl CA doppler  . TIA (transient ischemic attack) 08/07/2012    2D Echo - EF 60-65, severe concentric hypertrophy in the left ventricle  . Dyspnea on exertion 11/30/2010    Gated Stress Dipyridamole Myocardial Perfusion - post stress EF-63, normal study, no ischemia demonstrated     Past Surgical History  Procedure Laterality Date  . Total knee arthroplasty  1988; 1998 X2    right; bilateral  . Tumor excision  1970's    "off my stomach"  . Cataract extraction w/ intraocular lens implant  ~ 2010    right; "put lens in; didn't work; had mass on my eye"  . Transthoracic echocardiogram  04-03-2012   DR KENNETH HILTY    LVSF  EF 80-85%/ HYPERTRONIC CARDIOMYOPATHY WITH MOSTLY MID-CAVITARY OBSTRUCTION AND A SMALLER COMPONENT OF LV OUTFLOW TRACT OBSTRUCTION/   EVIDENCE OF SEVERE DECOMPRESATION OF DIASTOLIC HEART FAILURE AND MODERATE TO SEVERE PULMONARY ARTERIAL HYPERTENSION  . Joint replacement  '88 and '98    TOTAL RIGHT KNEE X2  1988  &  1998 (DUE TO JOINT MALFUNCTION)   TOTAL LEFT KNEE 1998  . Carpometacarpel suspension plasty  06/16/2012    Procedure: CARPOMETACARPEL [facility]) SUSPENSION PLASTY;  Surgeon: Jodi Marble, MD;  Location: [facility];  Service: Orthopedics;  Laterality: Right;  RIGHT THUMB SUSPENSION ARTHROPLASTY.  Marland Kitchen Eye surgery Right ~ 2011    "not a cataract; just a  growth" (09/26/2012)  . Cardiac catheterization Bilateral 01/19/2004    NL cors. neg Nuc 2012    Family History  Problem Relation Age of Onset  . Other Mother   . Coronary artery disease Father     sudden death in his 42's  . Diabetes Father   . Cancer Father   . Diabetes Sister     History  Substance Use Topics  . Smoking status: Never Smoker   . Smokeless tobacco: Never Used  . Alcohol Use: Yes    OB History   Grav Para Term Preterm Abortions TAB SAB Ect Mult Living                  Review of Systems  Constitutional: Negative for fever and chills.  HENT: Negative for congestion, sore throat, rhinorrhea and neck pain.   Eyes: Negative for  photophobia and visual disturbance.  Respiratory: Positive for cough, sputum production and shortness of breath. Negative for hemoptysis.   Cardiovascular: Negative for chest pain, leg swelling and PND.  Gastrointestinal: Negative for nausea, vomiting, abdominal pain, diarrhea and constipation.  Endocrine: Negative for polyphagia and polyuria.  Genitourinary: Negative for dysuria, flank pain, vaginal bleeding, vaginal discharge and enuresis.  Musculoskeletal: Negative for back pain and gait problem.  Skin: Negative for color change and rash.  Neurological: Negative for dizziness, syncope, light-headedness and numbness.  Hematological: Negative for adenopathy. Does not bruise/bleed easily.  All other systems reviewed and are negative.    Allergies  Review of patient's allergies indicates no known allergies.  Home Medications   No current outpatient prescriptions on file.  BP 89/47  Pulse 63  Temp(Src) 97.5 F (36.4 C) (Oral)  Resp 19  Wt 254 lb 10.1 oz (115.5 kg)  BMI 41.12 kg/m2  SpO2 85%  Physical Exam  Vitals reviewed. Constitutional: She is oriented to person, place, and time. She appears well-developed and well-nourished.  HENT:  Head: Normocephalic and atraumatic.  Right Ear: External ear normal.  Left Ear: External ear normal.  Eyes: Conjunctivae and EOM are normal. Pupils are equal, round, and reactive to light.  Neck: Normal range of motion. Neck supple.  Cardiovascular: Normal rate, regular rhythm, normal heart sounds and intact distal pulses.   Pulmonary/Chest: Effort normal. She has rales in the right lower field and the left lower field.  Abdominal: Soft. Bowel sounds are normal. There is no tenderness.  Musculoskeletal: Normal range of motion.  Neurological: She is alert and oriented to person, place, and time.  Skin: Skin is warm and dry.    ED Course  Procedures (including critical care time)  Labs Reviewed  BASIC METABOLIC PANEL - Abnormal; Notable  for the following:    Chloride 95 (*)    Glucose, Bld 143 (*)    BUN 56 (*)    Creatinine, Ser 3.18 (*)    GFR calc non Af Amer 13 (*)    GFR calc Af Amer 15 (*)    All other components within normal limits  CBC WITH DIFFERENTIAL - Abnormal; Notable for the following:    RDW 16.4 (*)    All other components within normal limits  PROTIME-INR - Abnormal; Notable for the following:    Prothrombin Time 29.8 (*)    INR 3.03 (*)    All other components within normal limits  PHENYTOIN LEVEL, TOTAL - Abnormal; Notable for the following:    Phenytoin Lvl <2.5 (*)    All other components within normal limits  PRO B NATRIURETIC  PEPTIDE - Abnormal; Notable for the following:    Pro B Natriuretic peptide (BNP) 22049.0 (*)    All other components within normal limits  POCT I-STAT TROPONIN I - Abnormal; Notable for the following:    Troponin i, poc 0.32 (*)    All other components within normal limits  MRSA PCR SCREENING  TROPONIN I  TROPONIN I  TROPONIN I  PROTIME-INR  COMPREHENSIVE METABOLIC PANEL   Results for orders placed during the hospital encounter of 12/30/12  MRSA PCR SCREENING      Result Value Range   MRSA by PCR NEGATIVE  NEGATIVE  BASIC METABOLIC PANEL      Result Value Range   Sodium 135  135 - 145 mEq/L   Potassium 4.1  3.5 - 5.1 mEq/L   Chloride 95 (*) 96 - 112 mEq/L   CO2 26  19 - 32 mEq/L   Glucose, Bld 143 (*) 70 - 99 mg/dL   BUN 56 (*) 6 - 23 mg/dL   Creatinine, Ser 6.29 (*) 0.50 - 1.10 mg/dL   Calcium 9.4  8.4 - 52.8 mg/dL   GFR calc non Af Amer 13 (*) >90 mL/min   GFR calc Af Amer 15 (*) >90 mL/min  CBC WITH DIFFERENTIAL      Result Value Range   WBC 8.6  4.0 - 10.5 K/uL   RBC 4.16  3.87 - 5.11 MIL/uL   Hemoglobin 12.9  12.0 - 15.0 g/dL   HCT 41.3  24.4 - 01.0 %   MCV 93.3  78.0 - 100.0 fL   MCH 31.0  26.0 - 34.0 pg   MCHC 33.2  30.0 - 36.0 g/dL   RDW 27.2 (*) 53.6 - 64.4 %   Platelets 205  150 - 400 K/uL   Neutrophils Relative % 74  43 - 77 %    Neutro Abs 6.4  1.7 - 7.7 K/uL   Lymphocytes Relative 16  12 - 46 %   Lymphs Abs 1.3  0.7 - 4.0 K/uL   Monocytes Relative 8  3 - 12 %   Monocytes Absolute 0.7  0.1 - 1.0 K/uL   Eosinophils Relative 2  0 - 5 %   Eosinophils Absolute 0.2  0.0 - 0.7 K/uL   Basophils Relative 1  0 - 1 %   Basophils Absolute 0.0  0.0 - 0.1 K/uL  PROTIME-INR      Result Value Range   Prothrombin Time 29.8 (*) 11.6 - 15.2 seconds   INR 3.03 (*) 0.00 - 1.49  PHENYTOIN LEVEL, TOTAL      Result Value Range   Phenytoin Lvl <2.5 (*) 10.0 - 20.0 ug/mL  PRO B NATRIURETIC PEPTIDE      Result Value Range   Pro B Natriuretic peptide (BNP) 22049.0 (*) 0 - 450 pg/mL  POCT I-STAT TROPONIN I      Result Value Range   Troponin i, poc 0.32 (*) 0.00 - 0.08 ng/mL   Comment NOTIFIED PHYSICIAN     Comment 3             Dg Chest Portable 1 View  12/30/2012   *RADIOLOGY REPORT*  Clinical Data: Shortness of breath.  Hypotension.  PORTABLE CHEST - 1 VIEW  Comparison: 12/05/2012.  Findings: Stable enlarged cardiac silhouette.  Clear lungs with normal vascularity.  Thoracic spine and bilateral shoulder degenerative changes.  IMPRESSION: No acute abnormality.  Stable cardiomegaly.   Original Report Authenticated By: Beckie Salts, M.D.     1.  CHF (congestive heart failure), acute, diastolic   2. Renal failure (ARF), acute on chronic   3. Chronic venous insufficiency   4. Diabetes mellitus type 2 in obese   5. HTN (hypertension)   6. Hyperlipidemia   7. Cardiorenal syndrome, stage 1-4 or unspecified chronic kidney disease, with heart failure      Date: 12/30/2012  Rate: 61  Rhythm: normal sinus rhythm  QRS Axis: left  Intervals: QT prolonged  ST/T Wave abnormalities: q waves in anterior leads  Conduction Disutrbances:none  Narrative Interpretation: NSR with prolonged QT and stable nonspecific findings  Old EKG Reviewed: unchanged    MDM  79 y.o. female  with pertinent PMH of CHF, CKD, DM, Pulm HTN, HTN, sz, cva  presents with dyspnea acutely worsening from chronic over today.  Pt seen by PCP today, had hypotension, and was sent here for further wu.  On arrival, pt still hypotensive, afebrile, and with normal respiratory rate.  Physical exam with bibasilar rales. CXR and labs consistent with CHF exacerbation which fits clinical scenario.  Troponin elevated but at pt baseline. Consulted cardiology, pt admitted.     Labs and imaging as above reviewed by myself and attending,Dr. Bebe Shaggy, with whom case was discussed.   1. CHF (congestive heart failure), acute, diastolic   2. Renal failure (ARF), acute on chronic   3. Chronic venous insufficiency   4. Diabetes mellitus type 2 in obese   5. HTN (hypertension)   6. Hyperlipidemia   7. Cardiorenal syndrome, stage 1-4 or unspecified chronic kidney disease, with heart failure             Noel Gerold, MD 12/30/12 2359

## 2012-12-30 NOTE — ED Notes (Signed)
Attempted report 

## 2012-12-30 NOTE — ED Notes (Signed)
Cardiology PA at bedside. 

## 2012-12-30 NOTE — ED Notes (Signed)
Patient weighed 256 lbs.

## 2012-12-30 NOTE — H&P (Addendum)
Pt. Seen and examined. Agree with the NP/PA-C note as written.  Well known patient of mine with recurrent diastolic, predominant right heart failure. She has had 20 lb weight gain over the past week. She called the office and was instructed to increase her zaroxolyn for a few days this week, which she did, however, it did not result in improved diuresis. When she developed significant orthopnea, she presented to [facility]. On admission, she was borderline hypotensive.  Creatinine is more elevated than baseline. She is on bactrim for a LE cellulitis as prescribed by her PCP, but does not show signs of sepsis.  Her POC troponin is again elevated, however, lab troponins have been negative in the past - BNP is >20K.  I suspect CHF is the cause of her elevated troponin.    IMP: 1. Acute on chronic , diastolic, predominate congestive right heart failure (EF recently 65%) 2. LE cellulitis 3. Seizure disorder on dilantin (secondary to prior stroke) 4. OSA  5. DM2 6. HTN 7. Hypotension 8. Cardiorenal syndrome, with acute renal failure on chronic kidney disease, and heart failure  Plan: 1.  I would recommend stepdown admission to watch her closely.  We will try diuresis with high dose lasix bolus, followed by lasix gtts.  Plan nephrology consult in the am tomorrow - she currently doesn't see a nephrologist. 2. Continue current antibiotics for cellulitis - no clear signs of septic shock, however, if bp drops or fever or leukocytosis is present, I would consider blood cultures - SBP would be on the differential due to significant ascites. 3. Hold blood pressure medications except clonidine to avoid withdrawal for now. 4. Trend cardiac enzymes.  Chrystie Nose, MD, [facility] Attending Cardiologist [facility]

## 2012-12-30 NOTE — H&P (Signed)
Chief Complaint: SOB and Lower Extremity Edema  HPI: The patient is a 79 y/o AAF, followed at [facility] by Dr. Rennis Golden. Her history is significant for chronic diastolic dysfunction, DM, HTN, obesity and OSA. She was recently admitted in March of this year for A/C CHF. During that admission, she was treated with IV Lasix.  She was diuresed to a dry weight of approximately 233 lbs. She was last seen in follow-up at [facility] on 11/25/12, by Corine Shelter, PA-C. At that visit she was compensated and stable. She was instructed to continue with her dose of home Lasix, 40 mg BID, and Zaroxolyn PRN. She presented to the [facility] ER today with complaints of progressively worsening SOB and bilateral LEE. She first noticed dyspnea with mild exertion x 7 days, but noted resting dyspnea yesterday. She has associated orthopnea and sleeps with 3 pillows. No PND or chest pain. She endorses a 20 lb weight gain over the past week. Other associated symptoms include a persistent, dry cough x 7 days. She reports compliance with her Lasix and Zaroxolyn. She states that she did eat sausage for breakfast a few days ago, but otherwise reports being compliant with a low sodium diet.   The patient also states that she was admitted to [facility] from 5/23- 5/25 due to a seizure.  CT and MRI were both negative. This was felt to be secondary to a prior right cortical stroke in January of this year.  She denies further occurrence.   Past Medical History  Diagnosis Date  . Hypertension   . Venous stasis of lower extremity WEARS TEDS    BLE--  CURRENTLY NO ULCERS/ WOUNDS  . Aortic insufficiency     mild/H&P 04/02/2012  . Type II diabetes mellitus   . OSA on CPAP   . Pulmonary hypertension per last echo 03-28-2012  mod.-severe     per dr wert note this related to L ht pressures from diastolic chf  . Hypertrophic obstructive cardiomyopathy(425.11)   . Arthritis   . CKD (chronic kidney disease) stage 3, GFR 30-59 ml/min   . Heart murmur   . History of DVT  of lower extremity lower right leg nov 2012  . Hyperlipidemia   . History of gout 06-09-2012  per pt stable  . Chronic diastolic CHF (congestive heart failure), NYHA class 3 CARDIOLOGIST- DR HILTY--  LOV OCT 2013--  REQUESTED NOTE, EGK AND STRESS TEST    PER PT ON 06-09-2012  S&S OF CHF AND WT IS DOWN  . H/O hiatal hernia   . GERD (gastroesophageal reflux disease)   . Lower extremity edema   . PAF (paroxysmal atrial fibrillation), in SR on admit 09/25/2012  . Stroke Jan 2014    Rt brain, Nl CA doppler  . TIA (transient ischemic attack) 08/07/2012    2D Echo - EF 60-65, severe concentric hypertrophy in the left ventricle  . Dyspnea on exertion 11/30/2010    Gated Stress Dipyridamole Myocardial Perfusion - post stress EF-63, normal study, no ischemia demonstrated     Past Surgical History  Procedure Laterality Date  . Total knee arthroplasty  1988; 1998 X2    right; bilateral  . Tumor excision  1970's    "off my stomach"  . Cataract extraction w/ intraocular lens implant  ~ 2010    right; "put lens in; didn't work; had mass on my eye"  . Transthoracic echocardiogram  04-03-2012   DR KENNETH HILTY    LVSF  EF 80-85%/ HYPERTRONIC CARDIOMYOPATHY WITH MOSTLY  MID-CAVITARY OBSTRUCTION AND A SMALLER COMPONENT OF LV OUTFLOW TRACT OBSTRUCTION/   EVIDENCE OF SEVERE DECOMPRESATION OF DIASTOLIC HEART FAILURE AND MODERATE TO SEVERE PULMONARY ARTERIAL HYPERTENSION  . Joint replacement  '88 and '98    TOTAL RIGHT KNEE X2  1988  &  1998 (DUE TO JOINT MALFUNCTION)   TOTAL LEFT KNEE 1998  . Carpometacarpel suspension plasty  06/16/2012    Procedure: CARPOMETACARPEL [facility]) SUSPENSION PLASTY;  Surgeon: Jodi Marble, MD;  Location: [facility];  Service: Orthopedics;  Laterality: Right;  RIGHT THUMB SUSPENSION ARTHROPLASTY.  Marland Kitchen Eye surgery Right ~ 2011    "not a cataract; just a growth" (09/26/2012)  . Cardiac catheterization Bilateral 01/19/2004    NL cors. neg Nuc 2012    Family History   Problem Relation Age of Onset  . Other Mother   . Coronary artery disease Father     sudden death in his 42's  . Diabetes Father   . Cancer Father   . Diabetes Sister    Social History:  reports that she has never smoked. She has never used smokeless tobacco. She reports that  drinks alcohol. She reports that she does not use illicit drugs.  Allergies: No Known Allergies  Prior to Admission medications   Medication Sig Start Date End Date Taking? Authorizing Provider  allopurinol (ZYLOPRIM) 300 MG tablet Take 150 mg by mouth daily.   Yes Historical Provider, MD  cloNIDine (CATAPRES) 0.2 MG tablet Take 0.2 mg by mouth 2 (two) times daily.  09/21/12  Yes Historical Provider, MD  furosemide (LASIX) 40 MG tablet Take 40 mg by mouth 2 (two) times daily.   Yes Historical Provider, MD  glimepiride (AMARYL) 4 MG tablet Take 4 mg by mouth daily before breakfast.    Yes Historical Provider, MD  insulin lispro protamine-insulin lispro (HUMALOG 75/25) (75-25) 100 UNIT/ML SUSP Inject 5-10 Units into the skin 2 (two) times daily with a meal. 10 units with breakfast and 5 units at dinner   Yes Historical Provider, MD  isosorbide-hydrALAZINE (BIDIL) 20-37.5 MG per tablet Take 1 tablet by mouth 3 (three) times daily.   Yes Historical Provider, MD  metolazone (ZAROXOLYN) 2.5 MG tablet 2.5 mg. Take one tab 3 times a week as needed based on weight- if weight is up 5lbs, take one tablet 11/13/12  Yes Historical Provider, MD  metoprolol succinate (TOPROL-XL) 25 MG 24 hr tablet Take 37.5 mg by mouth 2 (two) times daily.   Yes Historical Provider, MD  omeprazole (PRILOSEC) 20 MG capsule Take 20 mg by mouth every morning.    Yes Historical Provider, MD  phenytoin (DILANTIN) 300 MG ER capsule Take 1 capsule (300 mg total) by mouth at bedtime. 12/07/12  Yes Henderson Cloud, MD  potassium chloride SA (K-DUR,KLOR-CON) 20 MEQ tablet Take 20 mEq by mouth 2 (two) times daily.   Yes Historical Provider, MD   senna-docusate (SENOKOT-S) 8.6-50 MG per tablet Take 1 tablet by mouth at bedtime as needed for constipation.   Yes Historical Provider, MD  simvastatin (ZOCOR) 10 MG tablet Take 10 mg by mouth at bedtime.   Yes Historical Provider, MD  sulfamethoxazole-trimethoprim (BACTRIM DS) 800-160 MG per tablet Take 1 tablet by mouth 2 (two) times daily.   Yes Historical Provider, MD  warfarin (COUMADIN) 2.5 MG tablet Take 1.25-2.5 mg by mouth every evening. 2.5mg /day except take 1.25mg  on MF   Yes Historical Provider, MD     Results for orders placed during the hospital  encounter of 12/30/12 (from the past 48 hour(s))  BASIC METABOLIC PANEL     Status: Abnormal   Collection Time    12/30/12  4:52 PM      Result Value Range   Sodium 135  135 - 145 mEq/L   Potassium 4.1  3.5 - 5.1 mEq/L   Chloride 95 (*) 96 - 112 mEq/L   CO2 26  19 - 32 mEq/L   Glucose, Bld 143 (*) 70 - 99 mg/dL   BUN 56 (*) 6 - 23 mg/dL   Creatinine, Ser 1.61 (*) 0.50 - 1.10 mg/dL   Calcium 9.4  8.4 - 09.6 mg/dL   GFR calc non Af Amer 13 (*) >90 mL/min   GFR calc Af Amer 15 (*) >90 mL/min   Comment:            The eGFR has been calculated     using the CKD EPI equation.     This calculation has not been     validated in all clinical     situations.     eGFR's persistently     <90 mL/min signify     possible Chronic Kidney Disease.  CBC WITH DIFFERENTIAL     Status: Abnormal   Collection Time    12/30/12  4:52 PM      Result Value Range   WBC 8.6  4.0 - 10.5 K/uL   RBC 4.16  3.87 - 5.11 MIL/uL   Hemoglobin 12.9  12.0 - 15.0 g/dL   HCT 04.5  40.9 - 81.1 %   MCV 93.3  78.0 - 100.0 fL   MCH 31.0  26.0 - 34.0 pg   MCHC 33.2  30.0 - 36.0 g/dL   RDW 91.4 (*) 78.2 - 95.6 %   Platelets 205  150 - 400 K/uL   Neutrophils Relative % 74  43 - 77 %   Neutro Abs 6.4  1.7 - 7.7 K/uL   Lymphocytes Relative 16  12 - 46 %   Lymphs Abs 1.3  0.7 - 4.0 K/uL   Monocytes Relative 8  3 - 12 %   Monocytes Absolute 0.7  0.1 - 1.0 K/uL    Eosinophils Relative 2  0 - 5 %   Eosinophils Absolute 0.2  0.0 - 0.7 K/uL   Basophils Relative 1  0 - 1 %   Basophils Absolute 0.0  0.0 - 0.1 K/uL  PROTIME-INR     Status: Abnormal   Collection Time    12/30/12  4:52 PM      Result Value Range   Prothrombin Time 29.8 (*) 11.6 - 15.2 seconds   INR 3.03 (*) 0.00 - 1.49  PHENYTOIN LEVEL, TOTAL     Status: Abnormal   Collection Time    12/30/12  4:52 PM      Result Value Range   Phenytoin Lvl <2.5 (*) 10.0 - 20.0 ug/mL  PRO B NATRIURETIC PEPTIDE     Status: Abnormal   Collection Time    12/30/12  4:52 PM      Result Value Range   Pro B Natriuretic peptide (BNP) 22049.0 (*) 0 - 450 pg/mL  POCT I-STAT TROPONIN I     Status: Abnormal   Collection Time    12/30/12  5:06 PM      Result Value Range   Troponin i, poc 0.32 (*) 0.00 - 0.08 ng/mL   Comment NOTIFIED PHYSICIAN     Comment 3  Comment: Due to the release kinetics of cTnI,     a negative result within the first hours     of the onset of symptoms does not rule out     myocardial infarction with certainty.     If myocardial infarction is still suspected,     repeat the test at appropriate intervals.   Dg Chest Portable 1 View  12/30/2012   *RADIOLOGY REPORT*  Clinical Data: Shortness of breath.  Hypotension.  PORTABLE CHEST - 1 VIEW  Comparison: 12/05/2012.  Findings: Stable enlarged cardiac silhouette.  Clear lungs with normal vascularity.  Thoracic spine and bilateral shoulder degenerative changes.  IMPRESSION: No acute abnormality.  Stable cardiomegaly.   Original Report Authenticated By: Beckie Salts, M.D.    Review of Systems  Constitutional: Positive for malaise/fatigue. Negative for fever, chills and diaphoresis.  HENT: Negative for congestion and sore throat.   Eyes: Negative for blurred vision, double vision and pain.  Respiratory: Positive for cough and shortness of breath. Negative for sputum production and wheezing.   Cardiovascular: Positive for  orthopnea and leg swelling. Negative for chest pain, palpitations, claudication and PND.  Gastrointestinal: Negative for nausea, vomiting, abdominal pain, diarrhea, constipation, blood in stool and melena.  Genitourinary: Negative for dysuria, urgency, frequency and hematuria.  Musculoskeletal: Negative for myalgias and falls.  Skin: Negative for itching and rash.  Neurological: Positive for seizures and weakness. Negative for dizziness, loss of consciousness and headaches.    Blood pressure 103/61, pulse 64, temperature 97.5 F (36.4 C), temperature source Oral, resp. rate 20, weight 256 lb (116.121 kg), SpO2 94.00%. Physical Exam  Constitutional: She is oriented to person, place, and time. She appears well-developed and well-nourished. No distress.  HENT:  Head: Normocephalic and atraumatic.  Eyes: Conjunctivae and EOM are normal. Pupils are equal, round, and reactive to light.  Neck: JVD: to the level of the ear. No tracheal deviation present. No thyromegaly present.  Cardiovascular: Normal rate, regular rhythm and intact distal pulses.  Exam reveals no gallop and no friction rub.   Murmur (2/6 ) heard. Respiratory: Effort normal. No respiratory distress. She has no wheezes. Rales: decreased BS bilaterally. She exhibits no tenderness.  GI: Soft. Bowel sounds are normal. She exhibits no distension and no mass. There is no tenderness.  The skin overlying the RLQ is darkened, compared to the contralateral side  Musculoskeletal: She exhibits edema (3+ LEE + Pedal edema).  + bilateral LE ulcers, bandaged in ace wrap  Lymphadenopathy:    She has no cervical adenopathy.  Neurological: She is alert and oriented to person, place, and time.  Skin: Skin is warm and dry. She is not diaphoretic.  Psychiatric: She has a normal mood and affect. Her behavior is normal.     Assessment/Plan Principal Problem:   Acute on chronic diastolic CHF (congestive heart failure), NYHA class 3, 09/25/12 Active  Problems:   Sleep apnea on c-pap    Chronic venous insufficiency with LE ulcers, will be followed at wound center   Diabetes mellitus type 2 in obese   HTN (hypertension)   CKD (chronic kidney disease) stage 3, GFR 30-59 ml/min   History of Rt brain CVA  Plan: Pt with known diastolic heart failure, class 3, presents with progressively worsening SOB and significant bilateral LEE. BNP is elevated at 22,049. She has 3+ pedal edema and + JVD. Will admit. Unfortunately the patient's SCr is 3.18. During previous admission, her SCr was ~1.6. She is also borderline hypotensive. Both renal  function and BP will likely limit diuresis. Will consult renal in the morning. Pt may require dialysis for fluid removal. ? holding PO diuretics. She is not on an ACE-I or ARB. Pt also noted to have mild elevated troponin of 0.32. However, she also had an elevated troponin during last hospitalization. She has no chest pain. EKG shows no acute changes. Dr. Rennis Golden to see and will provide further recommendation.  Allayne Butcher, PA-C 12/30/2012, 7:13 PM

## 2012-12-30 NOTE — Telephone Encounter (Signed)
Message forwarded to Dr. Rennis Golden.  Paper chart requested to be put on cart for review.

## 2012-12-31 ENCOUNTER — Encounter (HOSPITAL_COMMUNITY): Payer: Self-pay | Admitting: Radiology

## 2012-12-31 ENCOUNTER — Inpatient Hospital Stay (HOSPITAL_COMMUNITY): Payer: Medicare Other

## 2012-12-31 DIAGNOSIS — R570 Cardiogenic shock: Secondary | ICD-10-CM | POA: Diagnosis present

## 2012-12-31 DIAGNOSIS — I2789 Other specified pulmonary heart diseases: Secondary | ICD-10-CM

## 2012-12-31 DIAGNOSIS — I421 Obstructive hypertrophic cardiomyopathy: Secondary | ICD-10-CM

## 2012-12-31 DIAGNOSIS — I369 Nonrheumatic tricuspid valve disorder, unspecified: Secondary | ICD-10-CM

## 2012-12-31 LAB — RENAL FUNCTION PANEL
Albumin: 2.4 g/dL — ABNORMAL LOW (ref 3.5–5.2)
Calcium: 8.8 mg/dL (ref 8.4–10.5)
GFR calc Af Amer: 14 mL/min — ABNORMAL LOW (ref >90)
Glucose, Bld: 104 mg/dL — ABNORMAL HIGH (ref 70–99)
Phosphorus: 4.7 mg/dL — ABNORMAL HIGH (ref 2.3–4.6)
Potassium: 4.4 mEq/L (ref 3.5–5.1)
Sodium: 134 mEq/L — ABNORMAL LOW (ref 135–145)

## 2012-12-31 LAB — COMPREHENSIVE METABOLIC PANEL
ALT: 26 U/L (ref 0–35)
Alkaline Phosphatase: 101 U/L (ref 39–117)
CO2: 25 mEq/L (ref 19–32)
GFR calc Af Amer: 14 mL/min — ABNORMAL LOW (ref >90)
GFR calc non Af Amer: 12 mL/min — ABNORMAL LOW (ref >90)
Glucose, Bld: 141 mg/dL — ABNORMAL HIGH (ref 70–99)
Potassium: 4.3 mEq/L (ref 3.5–5.1)
Sodium: 133 mEq/L — ABNORMAL LOW (ref 135–145)

## 2012-12-31 LAB — GLUCOSE, CAPILLARY
Glucose-Capillary: 112 mg/dL — ABNORMAL HIGH (ref 70–99)
Glucose-Capillary: 89 mg/dL (ref 70–99)

## 2012-12-31 MED ORDER — HEPARIN SODIUM (PORCINE) 1000 UNIT/ML DIALYSIS
1000.0000 [IU] | INTRAMUSCULAR | Status: DC | PRN
  Administered 2013-01-06 (×2): 1400 [IU] via INTRAVENOUS_CENTRAL
  Filled 2012-12-31 (×2): qty 6

## 2012-12-31 MED ORDER — PHENYLEPHRINE HCL 10 MG/ML IJ SOLN
30.0000 ug/min | INTRAMUSCULAR | Status: DC
  Administered 2012-12-31: 60 ug/min via INTRAVENOUS
  Filled 2012-12-31 (×2): qty 4

## 2012-12-31 MED ORDER — PRISMASOL BGK 4/2.5 32-4-2.5 MEQ/L IV SOLN
INTRAVENOUS | Status: DC
  Administered 2012-12-31 – 2013-01-05 (×8): via INTRAVENOUS_CENTRAL
  Filled 2012-12-31 (×13): qty 5000

## 2012-12-31 MED ORDER — INSULIN ASPART 100 UNIT/ML ~~LOC~~ SOLN
0.0000 [IU] | Freq: Three times a day (TID) | SUBCUTANEOUS | Status: DC
  Administered 2013-01-01: 3 [IU] via SUBCUTANEOUS
  Administered 2013-01-02 (×2): 1 [IU] via SUBCUTANEOUS
  Administered 2013-01-03 – 2013-01-04 (×2): 3 [IU] via SUBCUTANEOUS
  Administered 2013-01-05: 1 [IU] via SUBCUTANEOUS
  Administered 2013-01-06: 2 [IU] via SUBCUTANEOUS
  Administered 2013-01-07 – 2013-01-11 (×7): 1 [IU] via SUBCUTANEOUS
  Administered 2013-01-13: 2 [IU] via SUBCUTANEOUS
  Administered 2013-01-13: 1 [IU] via SUBCUTANEOUS
  Administered 2013-01-14: 2 [IU] via SUBCUTANEOUS
  Administered 2013-01-18 – 2013-01-21 (×3): 1 [IU] via SUBCUTANEOUS

## 2012-12-31 MED ORDER — HEPARIN (PORCINE) 2000 UNITS/L FOR CRRT
INTRAVENOUS_CENTRAL | Status: DC | PRN
  Filled 2012-12-31: qty 1000

## 2012-12-31 MED ORDER — HEPARIN BOLUS VIA INFUSION (CRRT)
1000.0000 [IU] | INTRAVENOUS | Status: DC | PRN
  Filled 2012-12-31 (×3): qty 1000

## 2012-12-31 MED ORDER — WARFARIN SODIUM 2.5 MG PO TABS
2.5000 mg | ORAL_TABLET | Freq: Once | ORAL | Status: AC
  Administered 2012-12-31: 2.5 mg via ORAL
  Filled 2012-12-31: qty 1

## 2012-12-31 MED ORDER — PHENYLEPHRINE HCL 10 MG/ML IJ SOLN
30.0000 ug/min | INTRAVENOUS | Status: DC
  Administered 2012-12-31: 30 ug/min via INTRAVENOUS
  Administered 2012-12-31: 10 ug/min via INTRAVENOUS
  Filled 2012-12-31: qty 1

## 2012-12-31 MED ORDER — SODIUM CHLORIDE 0.9 % IJ SOLN
250.0000 [IU]/h | INTRAMUSCULAR | Status: DC
  Administered 2012-12-31: 250 [IU]/h via INTRAVENOUS_CENTRAL
  Administered 2013-01-01: 1700 [IU]/h via INTRAVENOUS_CENTRAL
  Administered 2013-01-01: 1450 [IU]/h via INTRAVENOUS_CENTRAL
  Administered 2013-01-01: 1650 [IU]/h via INTRAVENOUS_CENTRAL
  Administered 2013-01-02 (×2): 1700 [IU]/h via INTRAVENOUS_CENTRAL
  Administered 2013-01-03 – 2013-01-04 (×4): 1850 [IU]/h via INTRAVENOUS_CENTRAL
  Administered 2013-01-05: 1900 [IU]/h via INTRAVENOUS_CENTRAL
  Administered 2013-01-05: 1950 [IU]/h via INTRAVENOUS_CENTRAL
  Filled 2012-12-31 (×12): qty 2

## 2012-12-31 MED ORDER — VITAMIN K1 10 MG/ML IJ SOLN
5.0000 mg | Freq: Once | INTRAVENOUS | Status: DC
  Filled 2012-12-31: qty 0.5

## 2012-12-31 MED ORDER — PRISMASOL BGK 4/2.5 32-4-2.5 MEQ/L IV SOLN
INTRAVENOUS | Status: DC
  Administered 2012-12-31 – 2013-01-05 (×4): via INTRAVENOUS_CENTRAL
  Filled 2012-12-31 (×9): qty 5000

## 2012-12-31 MED ORDER — PRISMASOL BGK 4/2.5 32-4-2.5 MEQ/L IV SOLN
INTRAVENOUS | Status: DC
  Administered 2012-12-31 – 2013-01-06 (×23): via INTRAVENOUS_CENTRAL
  Filled 2012-12-31 (×33): qty 5000

## 2012-12-31 MED ORDER — HEPARIN SODIUM (PORCINE) 1000 UNIT/ML IJ SOLN
INTRAMUSCULAR | Status: AC
  Administered 2012-12-31: 16:00:00
  Filled 2012-12-31: qty 1

## 2012-12-31 MED ORDER — SULFAMETHOXAZOLE-TMP DS 800-160 MG PO TABS
1.0000 | ORAL_TABLET | Freq: Every day | ORAL | Status: DC
  Administered 2012-12-31: 1 via ORAL
  Filled 2012-12-31: qty 1

## 2012-12-31 MED ORDER — WARFARIN - PHARMACIST DOSING INPATIENT
Freq: Every day | Status: DC
  Administered 2012-12-31 – 2013-01-08 (×2)

## 2012-12-31 MED ORDER — DOXYCYCLINE HYCLATE 100 MG PO TABS
100.0000 mg | ORAL_TABLET | Freq: Two times a day (BID) | ORAL | Status: DC
  Administered 2012-12-31 – 2013-01-17 (×34): 100 mg via ORAL
  Filled 2012-12-31 (×39): qty 1

## 2012-12-31 NOTE — Procedures (Signed)
Successful placement of right IJ Trialysis catheter.  Tip at cavoatrial junction and ready to use.  No immediate complication.

## 2012-12-31 NOTE — Progress Notes (Signed)
At 2344 on 12/31/12 patient placed on 50% Ventimask due to poor sats on monitor even after asking patient to take a few deep breaths. Diminished lungs sounds present. Unable to have her turn or sit foreword due to increase work of breathing. Dr. Charm Barges made aware of current assessment and dopamine infusion. Ordered received to place urinary foley and give the 120 mg IV lasix that was held earlier in the shift ,due to low blood pressure. A 14 Fr urinary foley was placed under sterile procedure with the assistance of Jasmine December and [employer]. Cloudy yellow colored urine present. Urine culture sent to lab. Areas was cleaned before and after procedure. Foley strap placed to secure urinary foley in place. Patient reported no complaints of pain or discomfort with procedure.

## 2012-12-31 NOTE — Progress Notes (Signed)
ANTICOAGULATION CONSULT NOTE - Initial Consult  Pharmacy Consult for warfarin Indication: hx DVT, afib  No Known Allergies  Patient Measurements: Height: 5\' 5"  (165.1 cm) Weight: 254 lb 10.1 oz (115.5 kg) IBW/kg (Calculated) : 57  Vital Signs: Temp: 97.5 F (36.4 C) (06/18 1200) Temp src: Oral (06/18 1200) BP: 117/58 mmHg (06/18 1200) Pulse Rate: 76 (06/18 0430)  Labs:  Recent Labs  12/30/12 1652 12/30/12 2115 12/31/12 0440 12/31/12 0930  HGB 12.9  --   --   --   HCT 38.8  --   --   --   PLT 205  --   --   --   LABPROT 29.8*  --  28.0*  --   INR 3.03*  --  2.79*  --   CREATININE 3.18*  --  3.30*  --   TROPONINI  --  <0.30 <0.30 <0.30    Estimated Creatinine Clearance: 18.1 ml/min (by C-G formula based on Cr of 3.3).   Medical History: Past Medical History  Diagnosis Date  . Hypertension   . Venous stasis of lower extremity WEARS TEDS    BLE--  CURRENTLY NO ULCERS/ WOUNDS  . Aortic insufficiency     mild/H&P 04/02/2012  . Type II diabetes mellitus   . OSA on CPAP   . Pulmonary hypertension per last echo 03-28-2012  mod.-severe     per dr wert note this related to L ht pressures from diastolic chf  . Hypertrophic obstructive cardiomyopathy(425.11)   . Arthritis   . CKD (chronic kidney disease) stage 3, GFR 30-59 ml/min   . Heart murmur   . History of DVT of lower extremity lower right leg nov 2012  . Hyperlipidemia   . History of gout 06-09-2012  per pt stable  . Chronic diastolic CHF (congestive heart failure), NYHA class 3 CARDIOLOGIST- DR HILTY--  LOV OCT 2013--  REQUESTED NOTE, EGK AND STRESS TEST    PER PT ON 06-09-2012  S&S OF CHF AND WT IS DOWN  . H/O hiatal hernia   . GERD (gastroesophageal reflux disease)   . Lower extremity edema   . PAF (paroxysmal atrial fibrillation), in SR on admit 09/25/2012  . Stroke Jan 2014    Rt brain, Nl CA doppler  . TIA (transient ischemic attack) 08/07/2012    2D Echo - EF 60-65, severe concentric hypertrophy in the  left ventricle  . Dyspnea on exertion 11/30/2010    Gated Stress Dipyridamole Myocardial Perfusion - post stress EF-63, normal study, no ischemia demonstrated     Medications:  Prescriptions prior to admission  Medication Sig Dispense Refill  . allopurinol (ZYLOPRIM) 300 MG tablet Take 150 mg by mouth daily.      . cloNIDine (CATAPRES) 0.2 MG tablet Take 0.2 mg by mouth 2 (two) times daily.       . furosemide (LASIX) 40 MG tablet Take 40 mg by mouth 2 (two) times daily.      Marland Kitchen glimepiride (AMARYL) 4 MG tablet Take 4 mg by mouth daily before breakfast.       . insulin lispro protamine-insulin lispro (HUMALOG 75/25) (75-25) 100 UNIT/ML SUSP Inject 5-10 Units into the skin 2 (two) times daily with a meal. 10 units with breakfast and 5 units at dinner      . isosorbide-hydrALAZINE (BIDIL) 20-37.5 MG per tablet Take 1 tablet by mouth 3 (three) times daily.      . metolazone (ZAROXOLYN) 2.5 MG tablet 2.5 mg. Take one tab 3 times a week  as needed based on weight- if weight is up 5lbs, take one tablet      . metoprolol succinate (TOPROL-XL) 25 MG 24 hr tablet Take 37.5 mg by mouth 2 (two) times daily.      Marland Kitchen omeprazole (PRILOSEC) 20 MG capsule Take 20 mg by mouth every morning.       . phenytoin (DILANTIN) 300 MG ER capsule Take 1 capsule (300 mg total) by mouth at bedtime.  30 capsule  2  . potassium chloride SA (K-DUR,KLOR-CON) 20 MEQ tablet Take 20 mEq by mouth 2 (two) times daily.      Marland Kitchen senna-docusate (SENOKOT-S) 8.6-50 MG per tablet Take 1 tablet by mouth at bedtime as needed for constipation.      . simvastatin (ZOCOR) 10 MG tablet Take 10 mg by mouth at bedtime.      . sulfamethoxazole-trimethoprim (BACTRIM DS) 800-160 MG per tablet Take 1 tablet by mouth 2 (two) times daily.      Marland Kitchen warfarin (COUMADIN) 2.5 MG tablet Take 1.25-2.5 mg by mouth every evening. 2.5mg /day except take 1.25mg  on MF        Assessment: 76 yof with hx DVT and afib to continue on warfarin. Is now have temporary dialysis  catheter placed to initiate CRRT. IR to place catheter with INR of 2.79, no vitamin K to be given. Coumadin to resume tonight per pharmacy.  Of note, patient has been on bactrim for cellulitis/leg ulceration. There is a significant interaction with coumadin and may be contributing to nephrotoxicity per renal. To change to doxycycline to complete therapy. Still some interaction with coumadin but much less than bactrim. Also doxycycline does not require dose adjustment for renal dysfunction or CRRT/HD.    Goal of Therapy:  INR 2-3   Plan:  1. Warfarin 2.5mg  PO x 1 tonight 2. Daily INR 3. DC bactrim, start doxycycline 100mg  PO BID (MD - Per med history, it appears that the patient started this on 6/12 and was to continue it for 20 days. Need to clarify intended LOT) * INR will likely have some fluctuations over the next few days due to DC of bactrim and start of doxycycline. Will monitor closely and adjust doses as appropriate.   Sophiah Rolin, Drake Leach 12/31/2012,1:35 PM

## 2012-12-31 NOTE — Plan of Care (Signed)
Problem: Phase I Progression Outcomes Goal: Dyspnea controlled at rest (HF) Outcome: Not Progressing Increase work of breathing with small movements. Goal: Voiding-avoid urinary catheter unless indicated Outcome: Not Met (add Reason) Placed urinary catheter due to increase work of breathing and the need to follow urinary output due to aggressive diuresing.  Goal: Hemodynamically stable Outcome: Not Met (add Reason) Patient's blood pressure low and was placed on dopamine infusion.

## 2012-12-31 NOTE — Progress Notes (Signed)
Echocardiogram 2D Echocardiogram has been performed.  Beautifull Cisar 12/31/2012, 11:18 AM

## 2012-12-31 NOTE — Consult Note (Addendum)
Requesting Physician:  Dr. Rennis Golden Primary Care Physician: Dr. Renae Gloss  Reason for Consult:  Acute renal failure and diuretic refractoriness in patient with diastolic/right heart failure and hypotension  HPI: The patient is a 79 y.o. year-old AAF with DM, h/o CVA, OSA,, Sz disorder and baseline CKD.  She has had recurrent issues with predominantly right sided, diastolic heart failure.  She presented to the hospital when the combination of outpatient furosemide and metolazone did not effect a diuresis and she experience d a 20 pound or more weight gain over the past few weeks, on top of what she describes as "swollen weeping legs" for over a year.  Since admission she has been on a lasix drip, dopamine, and more recently phenylephrine for hypotension (and in the setting of severe LVH with a component of HOCM), without any significant diuresis or improvement in blood pressure and we are asked to see.    Her creatinine has been creeping up over the past year, but more significantly in the past month, having increased from 2 to 3.3 just in the last month.    She reports  Creat  Date/Time Value Range Status  11/26/2012 12:00 PM 1.87* 0.50 - 1.10 mg/dL Final     Creatinine, Ser  Date/Time Value Range Status  12/31/2012  4:40 AM 3.30* 0.50 - 1.10 mg/dL Final  10/22/8117  1:47 PM 3.18* 0.50 - 1.10 mg/dL Final  03/13/5620  3:08 AM 2.36* 0.50 - 1.10 mg/dL Final  6/57/8469  6:29 AM 2.08* 0.50 - 1.10 mg/dL Final  12/11/4130  4:40 PM 2.30* 0.50 - 1.10 mg/dL Final  07/18/7251  6:64 AM 1.72* 0.50 - 1.10 mg/dL Final  10/16/4740  5:95 AM 1.71* 0.50 - 1.10 mg/dL Final  6/38/7564  3:32 AM 1.57* 0.50 - 1.10 mg/dL Final  9/51/8841  6:60 AM 1.57* 0.50 - 1.10 mg/dL Final  01/13/1600  0:93 PM 1.59* 0.50 - 1.10 mg/dL Final  2/35/5732  2:02 AM 1.48* 0.50 - 1.10 mg/dL Final  5/42/7062 37:62 PM 1.60* 0.50 - 1.10 mg/dL Final  03/16/5175 16:07 PM 1.82* 0.50 - 1.10 mg/dL Final  37/07/624  9:48 AM 1.80* 0.50 - 1.10 mg/dL Final   5/46/2703  5:00 AM 1.69* 0.50 - 1.10 mg/dL Final  9/38/1829  9:37 AM 1.62* 0.50 - 1.10 mg/dL Final  1/69/6789  3:81 AM 1.59* 0.50 - 1.10 mg/dL Final  0/17/5102  5:85 AM 1.55* 0.50 - 1.10 mg/dL Final  2/77/8242  3:53 AM 1.36* 0.50 - 1.10 mg/dL Final  12/27/4313  4:00 AM 1.25* 0.50 - 1.10 mg/dL Final  8/67/6195  0:93 AM 1.27* 0.50 - 1.10 mg/dL Final  2/67/1245  8:09 PM 1.15* 0.50 - 1.10 mg/dL Final  9/83/3825 05:39 PM 1.41* 0.4 - 1.2 mg/dL Final  7/67/3419 37:90 PM 1.26* 0.4 - 1.2 mg/dL Final    Past Medical History:  Past Medical History  Diagnosis Date  . Hypertension   . Venous stasis of lower extremity WEARS TEDS    BLE--  CURRENTLY NO ULCERS/ WOUNDS  . Aortic insufficiency     mild/H&P 04/02/2012  . Type II diabetes mellitus   . OSA on CPAP   . Pulmonary hypertension per last echo 03-28-2012  mod.-severe     per dr wert note this related to L ht pressures from diastolic chf  . Hypertrophic obstructive cardiomyopathy(425.11)   . Arthritis   . CKD (chronic kidney disease) stage 3, GFR 30-59 ml/min   . Heart murmur   . History of DVT of lower extremity  lower right leg nov 2012  . Hyperlipidemia   . History of gout 06-09-2012  per pt stable  . Chronic diastolic CHF (congestive heart failure), NYHA class 3 CARDIOLOGIST- DR HILTY--  LOV OCT 2013--  REQUESTED NOTE, EGK AND STRESS TEST    PER PT ON 06-09-2012  S&S OF CHF AND WT IS DOWN  . H/O hiatal hernia   . GERD (gastroesophageal reflux disease)   . Lower extremity edema   . PAF (paroxysmal atrial fibrillation), in SR on admit 09/25/2012  . Stroke Jan 2014    Rt brain, Nl CA doppler  . TIA (transient ischemic attack) 08/07/2012    2D Echo - EF 60-65, severe concentric hypertrophy in the left ventricle  . Dyspnea on exertion 11/30/2010    Gated Stress Dipyridamole Myocardial Perfusion - post stress EF-63, normal study, no ischemia demonstrated     Past Surgical History:  Past Surgical History  Procedure Laterality Date  .  Total knee arthroplasty  1988; 1998 X2    right; bilateral  . Tumor excision  1970's    "off my stomach"  . Cataract extraction w/ intraocular lens implant  ~ 2010    right; "put lens in; didn't work; had mass on my eye"  . Transthoracic echocardiogram  04-03-2012   DR KENNETH HILTY    LVSF  EF 80-85%/ HYPERTRONIC CARDIOMYOPATHY WITH MOSTLY MID-CAVITARY OBSTRUCTION AND A SMALLER COMPONENT OF LV OUTFLOW TRACT OBSTRUCTION/   EVIDENCE OF SEVERE DECOMPRESATION OF DIASTOLIC HEART FAILURE AND MODERATE TO SEVERE PULMONARY ARTERIAL HYPERTENSION  . Joint replacement  '88 and '98    TOTAL RIGHT KNEE X2  1988  &  1998 (DUE TO JOINT MALFUNCTION)   TOTAL LEFT KNEE 1998  . Carpometacarpel suspension plasty  06/16/2012    Procedure: CARPOMETACARPEL [facility]) SUSPENSION PLASTY;  Surgeon: Jodi Marble, MD;  Location: [facility];  Service: Orthopedics;  Laterality: Right;  RIGHT THUMB SUSPENSION ARTHROPLASTY.  Marland Kitchen Eye surgery Right ~ 2011    "not a cataract; just a growth" (09/26/2012)  . Cardiac catheterization Bilateral 01/19/2004    NL cors. neg Nuc 2012    Family History:  Family History  Problem Relation Age of Onset  . Other Mother   . Coronary artery disease Father     sudden death in his 77's  . Diabetes Father   . Cancer Father   . Diabetes Sister    Social History:  reports that she has never smoked. She has never used smokeless tobacco. She reports that  drinks alcohol. She reports that she does not use illicit drugs.  Allergies: No Known Allergies  Home medications: Prior to Admission medications   Medication Sig Start Date End Date Taking? Authorizing Provider  allopurinol (ZYLOPRIM) 300 MG tablet Take 150 mg by mouth daily.   Yes Historical Provider, MD  cloNIDine (CATAPRES) 0.2 MG tablet Take 0.2 mg by mouth 2 (two) times daily.  09/21/12  Yes Historical Provider, MD  furosemide (LASIX) 40 MG tablet Take 40 mg by mouth 2 (two) times daily.   Yes Historical Provider, MD   glimepiride (AMARYL) 4 MG tablet Take 4 mg by mouth daily before breakfast.    Yes Historical Provider, MD  insulin lispro protamine-insulin lispro (HUMALOG 75/25) (75-25) 100 UNIT/ML SUSP Inject 5-10 Units into the skin 2 (two) times daily with a meal. 10 units with breakfast and 5 units at dinner   Yes Historical Provider, MD  isosorbide-hydrALAZINE (BIDIL) 20-37.5 MG per tablet Take 1 tablet  by mouth 3 (three) times daily.   Yes Historical Provider, MD  metolazone (ZAROXOLYN) 2.5 MG tablet 2.5 mg. Take one tab 3 times a week as needed based on weight- if weight is up 5lbs, take one tablet 11/13/12  Yes Historical Provider, MD  metoprolol succinate (TOPROL-XL) 25 MG 24 hr tablet Take 37.5 mg by mouth 2 (two) times daily.   Yes Historical Provider, MD  omeprazole (PRILOSEC) 20 MG capsule Take 20 mg by mouth every morning.    Yes Historical Provider, MD  phenytoin (DILANTIN) 300 MG ER capsule Take 1 capsule (300 mg total) by mouth at bedtime. 12/07/12  Yes Henderson Cloud, MD  potassium chloride SA (K-DUR,KLOR-CON) 20 MEQ tablet Take 20 mEq by mouth 2 (two) times daily.   Yes Historical Provider, MD  senna-docusate (SENOKOT-S) 8.6-50 MG per tablet Take 1 tablet by mouth at bedtime as needed for constipation.   Yes Historical Provider, MD  simvastatin (ZOCOR) 10 MG tablet Take 10 mg by mouth at bedtime.   Yes Historical Provider, MD  sulfamethoxazole-trimethoprim (BACTRIM DS) 800-160 MG per tablet Take 1 tablet by mouth 2 (two) times daily.   Yes Historical Provider, MD  warfarin (COUMADIN) 2.5 MG tablet Take 1.25-2.5 mg by mouth every evening. 2.5mg /day except take 1.25mg  on MF   Yes Historical Provider, MD    Inpatient medications: . allopurinol  150 mg Oral Daily  . cloNIDine  0.2 mg Oral Daily  . glimepiride  4 mg Oral QAC breakfast  . insulin aspart  0-9 Units Subcutaneous TID WC  . insulin aspart protamine- aspart  10 Units Subcutaneous Q breakfast  . insulin aspart protamine-  aspart  5 Units Subcutaneous Q supper  . pantoprazole  40 mg Oral Daily  . phenytoin  300 mg Oral QHS  . potassium chloride SA  20 mEq Oral BID  . simvastatin  10 mg Oral QHS  . sodium chloride  3 mL Intravenous Q12H  . sulfamethoxazole-trimethoprim  1 tablet Oral Daily  . [START ON 01/02/2013] warfarin  1.25 mg Oral Custom  . warfarin  2.5 mg Oral Custom  . Warfarin - Physician Dosing Inpatient   Does not apply q1800   . furosemide (LASIX) infusion 10 mg/hr (12/31/12 0354)  . phenylephrine (NEO-SYNEPHRINE) Adult infusion 10 mcg/min (12/31/12 1038)    Review of Systems Gen:  Generally tired, 20+ lb weight gain in 2-3 weeks HEENT:  No visual change, sore throat, difficulty swallowing. Resp:  DOE, PND, orthopnea Cardiac:  No chest pain GI:   Abdominal swelling and distension with weight gain GU:  Denies difficulty or change in voiding.  No change in urine color.     MS:  Denies joint pain or swelling.   Derm:  Chronic weeping lesions from her legs (new areas have "busted open" Neuro:   Seizures back in 11/2012 attributed to CVA Psych:  Denies symptoms of depression of anxiety.  No hallucination.    PHYSICAL EXAMINATION BP 117/58  Pulse 76  Temp(Src) 97.6 F (36.4 C) (Oral)  Resp 15  Ht 5\' 5"  (1.651 m)  Wt 115.5 kg (254 lb 10.1 oz)  BMI 42.37 kg/m2  SpO2 97% Very nice soft spoken AAF Mildy dyspneic VS as noted JVP to jaw angle with crackles at lung bases Abd wall edema + BS not tender 2+ thigh edema Legs wrapped - wraps not removed but areas of LE's above and below wraps with 2+ edema/woody changes  Basic Metabolic Panel:  Recent Labs Lab 12/30/12  1652 12/31/12 0440  NA 135 133*  K 4.1 4.3  CL 95* 94*  CO2 26 25  GLUCOSE 143* 141*  BUN 56* 60*  CREATININE 3.18* 3.30*  CALCIUM 9.4 9.5   Liver Function Tests:  Recent Labs Lab 12/31/12 0440  AST 36  ALT 26  ALKPHOS 101  BILITOT 0.4  PROT 6.3  ALBUMIN 2.8*   Recent Labs Lab 12/30/12 1652  WBC 8.6   NEUTROABS 6.4  HGB 12.9  HCT 38.8  MCV 93.3  PLT 205   Lab Results  Component Value Date   INR 2.79* 12/31/2012   INR 3.03* 12/30/2012   INR 1.2 12/23/2012    Recent Labs Lab 12/30/12 2115 12/31/12 0440 12/31/12 0930  TROPONINI <0.30 <0.30 <0.30   Xrays/Other Studies: Dg Chest Portable 1 View  12/30/2012   *RADIOLOGY REPORT*  Clinical Data: Shortness of breath.  Hypotension.  PORTABLE CHEST - 1 VIEW  Comparison: 12/05/2012.  Findings: Stable enlarged cardiac silhouette.  Clear lungs with normal vascularity.  Thoracic spine and bilateral shoulder degenerative changes.  IMPRESSION: No acute abnormality.  Stable cardiomegaly.   Original Report Authenticated By: Beckie Salts, M.D.   ASSESSMENT/RECOMMENDATIONS: [name] with DM2, history of CVA, Sz disorder, prior hypertension, baseline CKD3 (never worked up) and diastolic/right sided CHF with HOCM who presents with anasarca and acute renal failure in the setting of decompensated CHF, hypotension and is refractory to diuretics.  AKI Likely in large part cardiorenal, with hypotension now contributing Cannot r/o component of nephrotoxicity from TMP-SMZ which she is on for chronic leg ulcers Volume overload is significant Feel she would be best served by CVVHD given her volume issue AND acute renal failure (wopuld allow for slow controlled volume removal in the setting of low BP and HOCM) Situation will be even more difficult if chronic HD is needed, given very low blood pressures (in the 70's prior to phenylephrine)  REC: Will need INR reversed for catheter placement (unless DR. Lowella Dandy can do with current INR). I have ordered IV vitamin K, stopped coumadin and consulted IR for placement of a catheter (may have to be non-tunnelled at first given INR issues) Stop clonidine Decrease TMP-SMZ to renal dose Patient agrees to the above plan Have contacted Dr. Rennis Golden with these recommendations  Camille Bal,  MD [facility] [phone] pager 12/31/2012, 12:10 PM  ADDENDUM: DR. HENN IS OK TO PLACE CATHETER IN IJ WITH CURRENT INR SINCE NOT ASKING FOR TUNNELLED CATHETER SO HAVE DISCONTINUED IV VIT K ORDER, NOTIFIED NURSING AND WILL JUST ASK PHARMACY TO MANAGE THE COUMADIN.

## 2012-12-31 NOTE — Progress Notes (Signed)
Attempt time 2 to start CRRT on two different machines. Error messages received on both with both machines turning off mid treatment. Awaiting machine delivery from adjacent unit. Dialysis catheter flushed. Will restart asap.

## 2012-12-31 NOTE — Consult Note (Signed)
WOC consult Note Reason for Consult: Wounds to bilateral legs, patient being followed at Wound Center Wound type: Blistered area to right lower leg 8cm X 2cm, some patchy areas have evolved into partial thickness skin loss..  Another blistered area to posterior aspect of right lower leg 5cm X 2.5cm lower portion of blister is oozing a serous fluid, no odor. Pressure Ulcer POA: Not a pressure ulcer Periwound: Intact Dressing procedure/placement/frequency: Continue present plan of care as ordered by the outpatient wound care center: Silver dressing applied over both areas and covered with foam dressing, wrapped with kerlex.  Bedside nurse to wrap with Coban when they arrive on floor for light compression to treat generalized edema.  Wound type:  Full thickness wounds to left leg, one is 1cm X 1cm, 100% beefy red, small amount of serous drainage.  Another wound is 13cm X 5cm, 100% red, moderate amount of serous drainage. No odor noted. Periwound: Skin is intact, dry. Dressing procedure/placement/frequency: Silver dressing applied over both areas and covered with foam dressing, wrapped with kerlex.  Bedside nurse to wrap with Coban when they arrive on floor. Patient to follow up with wound center upon discharge.  Dressings are changed Q week. Norva Karvonen RN, MSN Student Please re-consult if further assistance is needed.  Thank-you,  Cammie Mcgee MSN, RN, CWOCN, [place], CNS [phone]

## 2012-12-31 NOTE — Progress Notes (Addendum)
DAILY PROGRESS NOTE  Subjective:  No events overnight. Remains dyspneic and cannot lie flat.  Urine output was very poor. She was hypotensive overnight and started on dopamine, but this has not helped urine output either.  Objective:  Temp:  [97.5 F (36.4 C)-98 F (36.7 C)] 97.6 F (36.4 C) (06/18 0730) Pulse Rate:  [55-76] 76 (06/18 0430) Resp:  [11-22] 16 (06/18 0800) BP: (70-115)/(25-81) 93/40 mmHg (06/18 0800) SpO2:  [85 %-100 %] 96 % (06/18 0800) FiO2 (%):  [40 %-50 %] 50 % (06/18 0800) Weight:  [254 lb 10.1 oz (115.5 kg)-256 lb (116.121 kg)] 254 lb 10.1 oz (115.5 kg) (06/18 0015) Weight change:   Intake/Output from previous day: 06/17 0701 - 06/18 0700 In: 354 [P.O.:60; I.V.:192; IV Piggyback:62] Out: 191 [Urine:190; Stool:1]  Intake/Output from this shift: Total I/O In: 26.5 [I.V.:26.5] Out: -   Medications: Current Facility-Administered Medications  Medication Dose Route Frequency Provider Last Rate Last Dose  . 0.9 %  sodium chloride infusion  250 mL Intravenous PRN Brittainy Simmons, PA-C      . acetaminophen (TYLENOL) tablet 650 mg  650 mg Oral Q4H PRN Brittainy Simmons, PA-C      . allopurinol (ZYLOPRIM) tablet 150 mg  150 mg Oral Daily Brittainy Simmons, PA-C      . ALPRAZolam Prudy Feeler) tablet 0.25 mg  0.25 mg Oral BID PRN Brittainy Simmons, PA-C      . cloNIDine (CATAPRES) tablet 0.2 mg  0.2 mg Oral Daily Brittainy Simmons, PA-C      . DOPamine (INTROPIN) 800 mg in dextrose 5 % 250 mL infusion  2-20 mcg/kg/min Intravenous Titrated Meridee Score, MD 6.5 mL/hr at 12/31/12 0000 3 mcg/kg/min at 12/31/12 0000  . furosemide (LASIX) 250 mg in dextrose 5 % 250 mL infusion  10 mg/hr Intravenous Continuous Brittainy Simmons, PA-C 10 mL/hr at 12/31/12 0354 10 mg/hr at 12/31/12 0354  . glimepiride (AMARYL) tablet 4 mg  4 mg Oral QAC breakfast Brittainy Simmons, PA-C      . insulin aspart protamine- aspart (NOVOLOG 70/30) injection 10 Units  10 Units Subcutaneous Q  breakfast Chrystie Nose, MD      . insulin aspart protamine- aspart (NOVOLOG 70/30) injection 5 Units  5 Units Subcutaneous Q supper Chrystie Nose, MD      . ondansetron [facility]) injection 4 mg  4 mg Intravenous Q6H PRN Brittainy Simmons, PA-C      . pantoprazole (PROTONIX) EC tablet 40 mg  40 mg Oral Daily Brittainy Simmons, PA-C      . phenytoin (DILANTIN) ER capsule 300 mg  300 mg Oral QHS Brittainy Simmons, PA-C   300 mg at 12/31/12 0018  . potassium chloride SA (K-DUR,KLOR-CON) CR tablet 20 mEq  20 mEq Oral BID Brittainy Simmons, PA-C   20 mEq at 12/31/12 0017  . senna-docusate (Senokot-S) tablet 1 tablet  1 tablet Oral QHS PRN Brittainy Simmons, PA-C      . simvastatin (ZOCOR) tablet 10 mg  10 mg Oral QHS Brittainy Simmons, PA-C   10 mg at 12/31/12 0017  . sodium chloride 0.9 % injection 3 mL  3 mL Intravenous Q12H Brittainy Simmons, PA-C   3 mL at 12/30/12 2200  . sodium chloride 0.9 % injection 3 mL  3 mL Intravenous PRN Brittainy Simmons, PA-C      . sulfamethoxazole-trimethoprim (BACTRIM DS) 800-160 MG per tablet 1 tablet  1 tablet Oral BID Robbie Lis, PA-C   1 tablet at 12/31/12 0017  . [START ON 01/02/2013]  warfarin (COUMADIN) tablet 1.25 mg  1.25 mg Oral Custom Chrystie Nose, MD      . warfarin (COUMADIN) tablet 2.5 mg  2.5 mg Oral Custom Chrystie Nose, MD      . Warfarin - Physician Dosing Inpatient   Does not apply [id number] Chrystie Nose, MD      . zolpidem (AMBIEN) tablet 5 mg  5 mg Oral QHS PRN Robbie Lis, PA-C        Physical Exam: General appearance: alert and no distress Neck: JVD - 5 cm above sternal notch, no adenopathy, no carotid bruit, supple, symmetrical, trachea midline and thyroid not enlarged, symmetric, no tenderness/mass/nodules Lungs: clear to auscultation bilaterally Heart: regularly irregular rhythm Abdomen: distended, +fluid wave indicative of ascitesw Extremities: edema 4+, bilateral compression wraps in place Pulses: 2+ and  symmetric Skin: chronic LE cellulitis Neurologic: Grossly normal  Lab Results: Results for orders placed during the hospital encounter of 12/30/12 (from the past 48 hour(s))  BASIC METABOLIC PANEL     Status: Abnormal   Collection Time    12/30/12  4:52 PM      Result Value Range   Sodium 135  135 - 145 mEq/L   Potassium 4.1  3.5 - 5.1 mEq/L   Chloride 95 (*) 96 - 112 mEq/L   CO2 26  19 - 32 mEq/L   Glucose, Bld 143 (*) 70 - 99 mg/dL   BUN 56 (*) 6 - 23 mg/dL   Creatinine, Ser 6.04 (*) 0.50 - 1.10 mg/dL   Calcium 9.4  8.4 - 54.0 mg/dL   GFR calc non Af Amer 13 (*) >90 mL/min   GFR calc Af Amer 15 (*) >90 mL/min   Comment:            The eGFR has been calculated     using the CKD EPI equation.     This calculation has not been     validated in all clinical     situations.     eGFR's persistently     <90 mL/min signify     possible Chronic Kidney Disease.  CBC WITH DIFFERENTIAL     Status: Abnormal   Collection Time    12/30/12  4:52 PM      Result Value Range   WBC 8.6  4.0 - 10.5 K/uL   RBC 4.16  3.87 - 5.11 MIL/uL   Hemoglobin 12.9  12.0 - 15.0 g/dL   HCT 98.1  19.1 - 47.8 %   MCV 93.3  78.0 - 100.0 fL   MCH 31.0  26.0 - 34.0 pg   MCHC 33.2  30.0 - 36.0 g/dL   RDW 29.5 (*) 62.1 - 30.8 %   Platelets 205  150 - 400 K/uL   Neutrophils Relative % 74  43 - 77 %   Neutro Abs 6.4  1.7 - 7.7 K/uL   Lymphocytes Relative 16  12 - 46 %   Lymphs Abs 1.3  0.7 - 4.0 K/uL   Monocytes Relative 8  3 - 12 %   Monocytes Absolute 0.7  0.1 - 1.0 K/uL   Eosinophils Relative 2  0 - 5 %   Eosinophils Absolute 0.2  0.0 - 0.7 K/uL   Basophils Relative 1  0 - 1 %   Basophils Absolute 0.0  0.0 - 0.1 K/uL  PROTIME-INR     Status: Abnormal   Collection Time    12/30/12  4:52 PM      Result Value  Range   Prothrombin Time 29.8 (*) 11.6 - 15.2 seconds   INR 3.03 (*) 0.00 - 1.49  PHENYTOIN LEVEL, TOTAL     Status: Abnormal   Collection Time    12/30/12  4:52 PM      Result Value Range    Phenytoin Lvl <2.5 (*) 10.0 - 20.0 ug/mL  PRO B NATRIURETIC PEPTIDE     Status: Abnormal   Collection Time    12/30/12  4:52 PM      Result Value Range   Pro B Natriuretic peptide (BNP) 22049.0 (*) 0 - 450 pg/mL  POCT I-STAT TROPONIN I     Status: Abnormal   Collection Time    12/30/12  5:06 PM      Result Value Range   Troponin i, poc 0.32 (*) 0.00 - 0.08 ng/mL   Comment NOTIFIED PHYSICIAN     Comment 3            Comment: Due to the release kinetics of cTnI,     a negative result within the first hours     of the onset of symptoms does not rule out     myocardial infarction with certainty.     If myocardial infarction is still suspected,     repeat the test at appropriate intervals.  TROPONIN I     Status: None   Collection Time    12/30/12  9:15 PM      Result Value Range   Troponin I <0.30  <0.30 ng/mL   Comment:            Due to the release kinetics of cTnI,     a negative result within the first hours     of the onset of symptoms does not rule out     myocardial infarction with certainty.     If myocardial infarction is still suspected,     repeat the test at appropriate intervals.  MRSA PCR SCREENING     Status: None   Collection Time    12/30/12  9:21 PM      Result Value Range   MRSA by PCR NEGATIVE  NEGATIVE   Comment:            The GeneXpert MRSA Assay (FDA     approved for NASAL specimens     only), is one component of a     comprehensive MRSA colonization     surveillance program. It is not     intended to diagnose MRSA     infection nor to guide or     monitor treatment for     MRSA infections.  TROPONIN I     Status: None   Collection Time    12/31/12  4:40 AM      Result Value Range   Troponin I <0.30  <0.30 ng/mL   Comment:            Due to the release kinetics of cTnI,     a negative result within the first hours     of the onset of symptoms does not rule out     myocardial infarction with certainty.     If myocardial infarction is still  suspected,     repeat the test at appropriate intervals.  PROTIME-INR     Status: Abnormal   Collection Time    12/31/12  4:40 AM      Result Value Range   Prothrombin Time 28.0 (*) 11.6 - 15.2  seconds   INR 2.79 (*) 0.00 - 1.49  COMPREHENSIVE METABOLIC PANEL     Status: Abnormal   Collection Time    12/31/12  4:40 AM      Result Value Range   Sodium 133 (*) 135 - 145 mEq/L   Potassium 4.3  3.5 - 5.1 mEq/L   Chloride 94 (*) 96 - 112 mEq/L   CO2 25  19 - 32 mEq/L   Glucose, Bld 141 (*) 70 - 99 mg/dL   BUN 60 (*) 6 - 23 mg/dL   Creatinine, Ser 9.56 (*) 0.50 - 1.10 mg/dL   Calcium 9.5  8.4 - 21.3 mg/dL   Total Protein 6.3  6.0 - 8.3 g/dL   Albumin 2.8 (*) 3.5 - 5.2 g/dL   AST 36  0 - 37 U/L   ALT 26  0 - 35 U/L   Alkaline Phosphatase 101  39 - 117 U/L   Total Bilirubin 0.4  0.3 - 1.2 mg/dL   GFR calc non Af Amer 12 (*) >90 mL/min   GFR calc Af Amer 14 (*) >90 mL/min   Comment:            The eGFR has been calculated     using the CKD EPI equation.     This calculation has not been     validated in all clinical     situations.     eGFR's persistently     <90 mL/min signify     possible Chronic Kidney Disease.    Imaging: Dg Chest Portable 1 View  12/30/2012   *RADIOLOGY REPORT*  Clinical Data: Shortness of breath.  Hypotension.  PORTABLE CHEST - 1 VIEW  Comparison: 12/05/2012.  Findings: Stable enlarged cardiac silhouette.  Clear lungs with normal vascularity.  Thoracic spine and bilateral shoulder degenerative changes.  IMPRESSION: No acute abnormality.  Stable cardiomegaly.   Original Report Authenticated By: Beckie Salts, M.D.    Assessment:  1. Principal Problem: 2.   Acute on chronic diastolic CHF (congestive heart failure), NYHA class 3, 09/25/12 3. Active Problems: 4.   Sleep apnea on c-pap  5.   Chronic venous insufficiency with LE ulcers, will be followed at wound center 6.   Diabetes mellitus type 2 in obese 7.   HTN (hypertension) 8.   CKD (chronic kidney  disease) stage 3, GFR 30-59 ml/min 9.   History of Rt brain CVA 10.   Venous stasis of lower extremity 11.   OSA on CPAP 12.   Cardiorenal syndrome with renal failure 13.  Cardiogenic shock  Plan:  1. Very poor urine output overnight associated with hypotension, ?third spacing. Seems to be a combination of cardiorenal syndrome and intrinsic renal failure. She has not diuresed any more with the addition of dopamine. There is a degree of HOCM on prior echo, but more of a mid-cavity gradient.  She may benefit from a switch to neosynephrine - although it will be preferable to have central access, I don't want to ruin potential UF access with a PICC line - ?tunneled catheter or TLC. She may need ultrafiltration +/- albumin. Will ask nephrology to see her this morning to aid with diuresis.  Keep in cardiac stepdown due to hypotension and inotropy.   Time Spent Directly with Patient:  60 minutes  CRITICAL CARE:  The patient is critically ill with multi-organ system failure and requires high complexity decision making for assessment and support, frequent evaluation and titration of therapies, application of advanced monitoring technologies  and extensive interpretation of multiple databases.  Length of Stay:  LOS: 1 day   Chrystie Nose, MD, [facility] Attending Cardiologist [facility]  HILTY,Kenneth C 12/31/2012, 8:55 AM

## 2012-12-31 NOTE — H&P (Signed)
[name] is an 79 y.o. female.   Chief Complaint: pt with acute on chronic renal failure(never on dialysis- never worked up) Bun/Cr worsening 20 lb wt gain in last few weeks Refractory to diuretics; CHF Scheduled for temporary dialysis catheter now INR 2.8 today HPI: CHF; anasarca; HTN; BLE venous stasis/ Hx dvt; DM; HOCM; HLD; Sz  Past Medical History  Diagnosis Date  . Hypertension   . Venous stasis of lower extremity WEARS TEDS    BLE--  CURRENTLY NO ULCERS/ WOUNDS  . Aortic insufficiency     mild/H&P 04/02/2012  . Type II diabetes mellitus   . OSA on CPAP   . Pulmonary hypertension per last echo 03-28-2012  mod.-severe     per dr wert note this related to L ht pressures from diastolic chf  . Hypertrophic obstructive cardiomyopathy(425.11)   . Arthritis   . CKD (chronic kidney disease) stage 3, GFR 30-59 ml/min   . Heart murmur   . History of DVT of lower extremity lower right leg nov 2012  . Hyperlipidemia   . History of gout 06-09-2012  per pt stable  . Chronic diastolic CHF (congestive heart failure), NYHA class 3 CARDIOLOGIST- DR HILTY--  LOV OCT 2013--  REQUESTED NOTE, EGK AND STRESS TEST    PER PT ON 06-09-2012  S&S OF CHF AND WT IS DOWN  . H/O hiatal hernia   . GERD (gastroesophageal reflux disease)   . Lower extremity edema   . PAF (paroxysmal atrial fibrillation), in SR on admit 09/25/2012  . Stroke Jan 2014    Rt brain, Nl CA doppler  . TIA (transient ischemic attack) 08/07/2012    2D Echo - EF 60-65, severe concentric hypertrophy in the left ventricle  . Dyspnea on exertion 11/30/2010    Gated Stress Dipyridamole Myocardial Perfusion - post stress EF-63, normal study, no ischemia demonstrated     Past Surgical History  Procedure Laterality Date  . Total knee arthroplasty  1988; 1998 X2    right; bilateral  . Tumor excision  1970's    "off my stomach"  . Cataract extraction w/ intraocular lens implant  ~ 2010    right; "put lens in; didn't work; had mass  on my eye"  . Transthoracic echocardiogram  04-03-2012   DR KENNETH HILTY    LVSF  EF 80-85%/ HYPERTRONIC CARDIOMYOPATHY WITH MOSTLY MID-CAVITARY OBSTRUCTION AND A SMALLER COMPONENT OF LV OUTFLOW TRACT OBSTRUCTION/   EVIDENCE OF SEVERE DECOMPRESATION OF DIASTOLIC HEART FAILURE AND MODERATE TO SEVERE PULMONARY ARTERIAL HYPERTENSION  . Joint replacement  '88 and '98    TOTAL RIGHT KNEE X2  1988  &  1998 (DUE TO JOINT MALFUNCTION)   TOTAL LEFT KNEE 1998  . Carpometacarpel suspension plasty  06/16/2012    Procedure: CARPOMETACARPEL [facility]) SUSPENSION PLASTY;  Surgeon: Jodi Marble, MD;  Location: [facility];  Service: Orthopedics;  Laterality: Right;  RIGHT THUMB SUSPENSION ARTHROPLASTY.  Marland Kitchen Eye surgery Right ~ 2011    "not a cataract; just a growth" (09/26/2012)  . Cardiac catheterization Bilateral 01/19/2004    NL cors. neg Nuc 2012    Family History  Problem Relation Age of Onset  . Other Mother   . Coronary artery disease Father     sudden death in his 16's  . Diabetes Father   . Cancer Father   . Diabetes Sister    Social History:  reports that she has never smoked. She has never used smokeless tobacco. She reports that  drinks alcohol. She reports that she does not use illicit drugs.  Allergies: No Known Allergies  Medications Prior to Admission  Medication Sig Dispense Refill  . allopurinol (ZYLOPRIM) 300 MG tablet Take 150 mg by mouth daily.      . cloNIDine (CATAPRES) 0.2 MG tablet Take 0.2 mg by mouth 2 (two) times daily.       . furosemide (LASIX) 40 MG tablet Take 40 mg by mouth 2 (two) times daily.      Marland Kitchen glimepiride (AMARYL) 4 MG tablet Take 4 mg by mouth daily before breakfast.       . insulin lispro protamine-insulin lispro (HUMALOG 75/25) (75-25) 100 UNIT/ML SUSP Inject 5-10 Units into the skin 2 (two) times daily with a meal. 10 units with breakfast and 5 units at dinner      . isosorbide-hydrALAZINE (BIDIL) 20-37.5 MG per tablet Take 1 tablet by mouth 3  (three) times daily.      . metolazone (ZAROXOLYN) 2.5 MG tablet 2.5 mg. Take one tab 3 times a week as needed based on weight- if weight is up 5lbs, take one tablet      . metoprolol succinate (TOPROL-XL) 25 MG 24 hr tablet Take 37.5 mg by mouth 2 (two) times daily.      Marland Kitchen omeprazole (PRILOSEC) 20 MG capsule Take 20 mg by mouth every morning.       . phenytoin (DILANTIN) 300 MG ER capsule Take 1 capsule (300 mg total) by mouth at bedtime.  30 capsule  2  . potassium chloride SA (K-DUR,KLOR-CON) 20 MEQ tablet Take 20 mEq by mouth 2 (two) times daily.      Marland Kitchen senna-docusate (SENOKOT-S) 8.6-50 MG per tablet Take 1 tablet by mouth at bedtime as needed for constipation.      . simvastatin (ZOCOR) 10 MG tablet Take 10 mg by mouth at bedtime.      . sulfamethoxazole-trimethoprim (BACTRIM DS) 800-160 MG per tablet Take 1 tablet by mouth 2 (two) times daily.      Marland Kitchen warfarin (COUMADIN) 2.5 MG tablet Take 1.25-2.5 mg by mouth every evening. 2.5mg /day except take 1.25mg  on MF        Results for orders placed during the hospital encounter of 12/30/12 (from the past 48 hour(s))  BASIC METABOLIC PANEL     Status: Abnormal   Collection Time    12/30/12  4:52 PM      Result Value Range   Sodium 135  135 - 145 mEq/L   Potassium 4.1  3.5 - 5.1 mEq/L   Chloride 95 (*) 96 - 112 mEq/L   CO2 26  19 - 32 mEq/L   Glucose, Bld 143 (*) 70 - 99 mg/dL   BUN 56 (*) 6 - 23 mg/dL   Creatinine, Ser 1.61 (*) 0.50 - 1.10 mg/dL   Calcium 9.4  8.4 - 09.6 mg/dL   GFR calc non Af Amer 13 (*) >90 mL/min   GFR calc Af Amer 15 (*) >90 mL/min   Comment:            The eGFR has been calculated     using the CKD EPI equation.     This calculation has not been     validated in all clinical     situations.     eGFR's persistently     <90 mL/min signify     possible Chronic Kidney Disease.  CBC WITH DIFFERENTIAL     Status: Abnormal   Collection Time  12/30/12  4:52 PM      Result Value Range   WBC 8.6  4.0 - 10.5 K/uL    RBC 4.16  3.87 - 5.11 MIL/uL   Hemoglobin 12.9  12.0 - 15.0 g/dL   HCT 45.4  09.8 - 11.9 %   MCV 93.3  78.0 - 100.0 fL   MCH 31.0  26.0 - 34.0 pg   MCHC 33.2  30.0 - 36.0 g/dL   RDW 14.7 (*) 82.9 - 56.2 %   Platelets 205  150 - 400 K/uL   Neutrophils Relative % 74  43 - 77 %   Neutro Abs 6.4  1.7 - 7.7 K/uL   Lymphocytes Relative 16  12 - 46 %   Lymphs Abs 1.3  0.7 - 4.0 K/uL   Monocytes Relative 8  3 - 12 %   Monocytes Absolute 0.7  0.1 - 1.0 K/uL   Eosinophils Relative 2  0 - 5 %   Eosinophils Absolute 0.2  0.0 - 0.7 K/uL   Basophils Relative 1  0 - 1 %   Basophils Absolute 0.0  0.0 - 0.1 K/uL  PROTIME-INR     Status: Abnormal   Collection Time    12/30/12  4:52 PM      Result Value Range   Prothrombin Time 29.8 (*) 11.6 - 15.2 seconds   INR 3.03 (*) 0.00 - 1.49  PHENYTOIN LEVEL, TOTAL     Status: Abnormal   Collection Time    12/30/12  4:52 PM      Result Value Range   Phenytoin Lvl <2.5 (*) 10.0 - 20.0 ug/mL  PRO B NATRIURETIC PEPTIDE     Status: Abnormal   Collection Time    12/30/12  4:52 PM      Result Value Range   Pro B Natriuretic peptide (BNP) 22049.0 (*) 0 - 450 pg/mL  POCT I-STAT TROPONIN I     Status: Abnormal   Collection Time    12/30/12  5:06 PM      Result Value Range   Troponin i, poc 0.32 (*) 0.00 - 0.08 ng/mL   Comment NOTIFIED PHYSICIAN     Comment 3            Comment: Due to the release kinetics of cTnI,     a negative result within the first hours     of the onset of symptoms does not rule out     myocardial infarction with certainty.     If myocardial infarction is still suspected,     repeat the test at appropriate intervals.  TROPONIN I     Status: None   Collection Time    12/30/12  9:15 PM      Result Value Range   Troponin I <0.30  <0.30 ng/mL   Comment:            Due to the release kinetics of cTnI,     a negative result within the first hours     of the onset of symptoms does not rule out     myocardial infarction with  certainty.     If myocardial infarction is still suspected,     repeat the test at appropriate intervals.  MRSA PCR SCREENING     Status: None   Collection Time    12/30/12  9:21 PM      Result Value Range   MRSA by PCR NEGATIVE  NEGATIVE   Comment:  The GeneXpert MRSA Assay (FDA     approved for NASAL specimens     only), is one component of a     comprehensive MRSA colonization     surveillance program. It is not     intended to diagnose MRSA     infection nor to guide or     monitor treatment for     MRSA infections.  TROPONIN I     Status: None   Collection Time    12/31/12  4:40 AM      Result Value Range   Troponin I <0.30  <0.30 ng/mL   Comment:            Due to the release kinetics of cTnI,     a negative result within the first hours     of the onset of symptoms does not rule out     myocardial infarction with certainty.     If myocardial infarction is still suspected,     repeat the test at appropriate intervals.  PROTIME-INR     Status: Abnormal   Collection Time    12/31/12  4:40 AM      Result Value Range   Prothrombin Time 28.0 (*) 11.6 - 15.2 seconds   INR 2.79 (*) 0.00 - 1.49  COMPREHENSIVE METABOLIC PANEL     Status: Abnormal   Collection Time    12/31/12  4:40 AM      Result Value Range   Sodium 133 (*) 135 - 145 mEq/L   Potassium 4.3  3.5 - 5.1 mEq/L   Chloride 94 (*) 96 - 112 mEq/L   CO2 25  19 - 32 mEq/L   Glucose, Bld 141 (*) 70 - 99 mg/dL   BUN 60 (*) 6 - 23 mg/dL   Creatinine, Ser 4.54 (*) 0.50 - 1.10 mg/dL   Calcium 9.5  8.4 - 09.8 mg/dL   Total Protein 6.3  6.0 - 8.3 g/dL   Albumin 2.8 (*) 3.5 - 5.2 g/dL   AST 36  0 - 37 U/L   ALT 26  0 - 35 U/L   Alkaline Phosphatase 101  39 - 117 U/L   Total Bilirubin 0.4  0.3 - 1.2 mg/dL   GFR calc non Af Amer 12 (*) >90 mL/min   GFR calc Af Amer 14 (*) >90 mL/min   Comment:            The eGFR has been calculated     using the CKD EPI equation.     This calculation has not been      validated in all clinical     situations.     eGFR's persistently     <90 mL/min signify     possible Chronic Kidney Disease.  TROPONIN I     Status: None   Collection Time    12/31/12  9:30 AM      Result Value Range   Troponin I <0.30  <0.30 ng/mL   Comment:            Due to the release kinetics of cTnI,     a negative result within the first hours     of the onset of symptoms does not rule out     myocardial infarction with certainty.     If myocardial infarction is still suspected,     repeat the test at appropriate intervals.  GLUCOSE, CAPILLARY     Status: Abnormal   Collection Time  12/31/12 12:36 PM      Result Value Range   Glucose-Capillary 112 (*) 70 - 99 mg/dL   Dg Chest Portable 1 View  12/30/2012   *RADIOLOGY REPORT*  Clinical Data: Shortness of breath.  Hypotension.  PORTABLE CHEST - 1 VIEW  Comparison: 12/05/2012.  Findings: Stable enlarged cardiac silhouette.  Clear lungs with normal vascularity.  Thoracic spine and bilateral shoulder degenerative changes.  IMPRESSION: No acute abnormality.  Stable cardiomegaly.   Original Report Authenticated By: Beckie Salts, M.D.    Review of Systems  Constitutional: Negative for fever.  Respiratory: Positive for shortness of breath. Negative for cough.   Cardiovascular: Negative for chest pain.  Gastrointestinal: Negative for nausea, vomiting and abdominal pain.  Neurological: Positive for weakness. Negative for headaches.  Psychiatric/Behavioral: The patient is nervous/anxious.     Blood pressure 117/58, pulse 76, temperature 97.5 F (36.4 C), temperature source Oral, resp. rate 15, height 5\' 5"  (1.651 m), weight 254 lb 10.1 oz (115.5 kg), SpO2 97.00%. Physical Exam  Constitutional: She is oriented to person, place, and time.  Cardiovascular: Normal rate and regular rhythm.   Murmur heard. Respiratory: Effort normal. She has wheezes.  GI: Soft. Bowel sounds are normal.  Obese   Musculoskeletal: Normal range of  motion.  Able to walk with help Weak B ulcers on legs- weeping  Neurological: She is alert and oriented to person, place, and time.  Skin: Skin is dry.  Psychiatric: She has a normal mood and affect. Her behavior is normal. Judgment and thought content normal.     Assessment/Plan Worsening Bun/Cr CHF; SOB; anasarca- refractory to diuretics Scheduled for temp dialysis catheter today. INR 2.8 today Pt aware of procedure benefits and risks and agreeable to proceed Consent signed and in chart  Zaion Hreha A 12/31/2012, 1:19 PM

## 2012-12-31 NOTE — Progress Notes (Signed)
[facility]  Patient Status: Active (receiving services up to time of hospitalization)  [facility] is providing the following services: RN, PT and OT  If patient discharges after hours, please call [phone].   [name] 12/31/2012, 5:23 PM

## 2013-01-01 ENCOUNTER — Telehealth: Payer: Self-pay | Admitting: Internal Medicine

## 2013-01-01 DIAGNOSIS — I6350 Cerebral infarction due to unspecified occlusion or stenosis of unspecified cerebral artery: Secondary | ICD-10-CM

## 2013-01-01 DIAGNOSIS — I5032 Chronic diastolic (congestive) heart failure: Secondary | ICD-10-CM

## 2013-01-01 LAB — POCT ACTIVATED CLOTTING TIME
Activated Clotting Time: 171 seconds
Activated Clotting Time: 181 seconds
Activated Clotting Time: 181 seconds
Activated Clotting Time: 187 seconds
Activated Clotting Time: 187 seconds
Activated Clotting Time: 198 seconds
Activated Clotting Time: 203 seconds
Activated Clotting Time: 191 seconds
Activated Clotting Time: 186 seconds
Activated Clotting Time: 186 seconds
Activated Clotting Time: 191 seconds
Activated Clotting Time: 191 seconds

## 2013-01-01 LAB — RENAL FUNCTION PANEL
BUN: 46 mg/dL — ABNORMAL HIGH (ref 6–23)
CO2: 26 mEq/L (ref 19–32)
Calcium: 8.4 mg/dL (ref 8.4–10.5)
Chloride: 99 mEq/L (ref 96–112)
Creatinine, Ser: 2.49 mg/dL — ABNORMAL HIGH (ref 0.50–1.10)
GFR calc non Af Amer: 18 mL/min — ABNORMAL LOW (ref >90)
Glucose, Bld: 86 mg/dL (ref 70–99)
Phosphorus: 4.1 mg/dL (ref 2.3–4.6)
Potassium: 4.4 mEq/L (ref 3.5–5.1)

## 2013-01-01 LAB — PROTIME-INR
INR: 4.73 — ABNORMAL HIGH (ref 0.00–1.49)
Prothrombin Time: 41.4 seconds — ABNORMAL HIGH (ref 11.6–15.2)

## 2013-01-01 LAB — APTT: aPTT: 50 seconds — ABNORMAL HIGH (ref 24–37)

## 2013-01-01 LAB — PHENYTOIN LEVEL, TOTAL: Phenytoin Lvl: 3.8 ug/mL — ABNORMAL LOW (ref 10.0–20.0)

## 2013-01-01 LAB — CBC
HCT: 39.5 % (ref 36.0–46.0)
Hemoglobin: 12.8 g/dL (ref 12.0–15.0)
RBC: 4.21 MIL/uL (ref 3.87–5.11)
WBC: 10.4 10*3/uL (ref 4.0–10.5)

## 2013-01-01 LAB — HEPATIC FUNCTION PANEL
ALT: 22 U/L (ref 0–35)
AST: 39 U/L — ABNORMAL HIGH (ref 0–37)
Bilirubin, Direct: 0.3 mg/dL (ref 0.0–0.3)
Total Bilirubin: 0.5 mg/dL (ref 0.3–1.2)

## 2013-01-01 LAB — URINE CULTURE: Colony Count: NO GROWTH

## 2013-01-01 LAB — GLUCOSE, CAPILLARY
Glucose-Capillary: 82 mg/dL (ref 70–99)
Glucose-Capillary: 59 mg/dL — ABNORMAL LOW (ref 70–99)
Glucose-Capillary: 202 mg/dL — ABNORMAL HIGH (ref 70–99)
Glucose-Capillary: 70 mg/dL (ref 70–99)

## 2013-01-01 MED ORDER — DEXTROSE 50 % IV SOLN
INTRAVENOUS | Status: AC
  Administered 2013-01-01: 25 mL
  Filled 2013-01-01: qty 50

## 2013-01-01 NOTE — Progress Notes (Signed)
Subjective:  CRRT started late yesterday d/t machine malfunction So far tolerating -50/hour off Phenylephrine is off Low urine output Still on lasix drip  Objective Vital signs in last 24 hours: Filed Vitals:   01/01/13 0730 01/01/13 0800 01/01/13 0900 01/01/13 1000  BP:  99/50 94/72 116/64  Pulse:  63 66 64  Temp: 96.8 F (36 C)     TempSrc: Axillary     Resp:   14 16  Height:      Weight:      SpO2:  95% 100% 100%   Weight change: 1.579 kg (3 lb 7.7 oz)  Intake/Output Summary (Last 24 hours) at 01/01/13 1055 Last data filed at 01/01/13 1000  Gross per 24 hour  Intake 1278.38 ml  Output   1930 ml  Net -651.62 ml   Physical Exam:  BP 116/64  Pulse 64  Temp(Src) 96.8 F (36 C) (Axillary)  Resp 16  Ht 5\' 5"  (1.651 m)  Wt 117.7 kg (259 lb 7.7 oz)  BMI 43.18 kg/m2  SpO2 100% Very nice soft spoken AAF Mildy dyspneic On venti mask VS as noted  JVP to jaw angle with crackles at lung bases  Abd wall edema + BS not tender  2+ thigh edema  Legs wrapped - wraps not removed but areas of LE's above and below wraps with 2+ edema/woody changes  Labs: Basic Metabolic Panel:  Recent Labs Lab 12/30/12 1652 12/31/12 0440 12/31/12 1533 01/01/13 0345  NA 135 133* 134* 136  K 4.1 4.3 4.4 4.4  CL 95* 94* 95* 99  CO2 26 25 26 25   GLUCOSE 143* 141* 104* 86  BUN 56* 60* 60* 46*  CREATININE 3.18* 3.30* 3.48* 2.61*  CALCIUM 9.4 9.5 8.8 9.0  PHOS  --   --  4.7* 4.1   Liver Function Tests:  Recent Labs Lab 12/31/12 0440 12/31/12 1533 01/01/13 0345 01/01/13 0730  AST 36  --   --  39*  ALT 26  --   --  22  ALKPHOS 101  --   --  91  BILITOT 0.4  --   --  0.5  PROT 6.3  --   --  5.9*  ALBUMIN 2.8* 2.4* 2.7* 2.5*    Recent Labs Lab 12/30/12 1652 01/01/13 0345  WBC 8.6 10.4  NEUTROABS 6.4  --   HGB 12.9 12.8  HCT 38.8 39.5  MCV 93.3 93.8  PLT 205 217   Recent Labs Lab 12/30/12 2115 12/31/12 0440 12/31/12 0930  TROPONINI <0.30 <0.30 <0.30   CBG:  Recent  Labs Lab 12/31/12 1725 12/31/12 2158 01/01/13 0728 01/01/13 0759 01/01/13 0918  GLUCAP 89 82 40* 59* 104*    Iron Studies: No results found for this basename: IRON, TIBC, TRANSFERRIN, FERRITIN,  in the last 168 hours Studies/Results: Ir Fluoro Guide Cv Line Right  12/31/2012   *RADIOLOGY REPORT*  Clinical history:Acute renal failure and needs hemodialysis.  PROCEDURE(S): PLACEMENT OF A NON TUNNELED DIALYSIS CATHETER WITH ULTRASOUND AND FLUOROSCOPIC GUIDANCE  Physician: Rachelle Hora. Henn, MD  Medications:None  Moderate sedation time:None  Fluoroscopy time: 30 seconds  Procedure:The procedure was explained to the patient.  The risks and benefits of the procedure were discussed and the patient's questions were addressed.  Informed consent was obtained from the patient.  Ultrasound demonstrated a patent right internal jugular vein.  The right side of the neck was prepped and draped in a sterile fashion.  Maximal barrier sterile technique was utilized including caps, mask, sterile gowns,  sterile gloves, sterile drape, hand hygiene and skin antiseptic.  The skin was anesthetized with lidocaine.  21 gauge needle was directed into the right internal jugular vein with ultrasound guidance.  Micropuncture dilator set was placed.  The tract was dilated over an Amplatz wire.  A 20 cm Trialysis catheter was advanced over the Amplatz wire and positioned at the cavoatrial junction.  The three lumens aspirated and flushed well.  The dialysis lumens were flushed with the proper amount of heparin.  Catheter was sutured to the skin. Fluoroscopic and ultrasound images were taken and saved for documentation.  Findings:Catheter tip at the cavoatrial junction.  Complications: None  Impression:Placement of a right jugular Trialysis catheter with fluoroscopic and ultrasound guidance.   Original Report Authenticated By: Richarda Overlie, M.D.   Ir US Guide Vasc Access Right  12/31/2012   *RADIOLOGY REPORT*  Clinical history:Acute renal  failure and needs hemodialysis.  PROCEDURE(S): PLACEMENT OF A NON TUNNELED DIALYSIS CATHETER WITH ULTRASOUND AND FLUOROSCOPIC GUIDANCE  Physician: Rachelle Hora. Henn, MD  Medications:None  Moderate sedation time:None  Fluoroscopy time: 30 seconds  Procedure:The procedure was explained to the patient.  The risks and benefits of the procedure were discussed and the patient's questions were addressed.  Informed consent was obtained from the patient.  Ultrasound demonstrated a patent right internal jugular vein.  The right side of the neck was prepped and draped in a sterile fashion.  Maximal barrier sterile technique was utilized including caps, mask, sterile gowns, sterile gloves, sterile drape, hand hygiene and skin antiseptic.  The skin was anesthetized with lidocaine.  21 gauge needle was directed into the right internal jugular vein with ultrasound guidance.  Micropuncture dilator set was placed.  The tract was dilated over an Amplatz wire.  A 20 cm Trialysis catheter was advanced over the Amplatz wire and positioned at the cavoatrial junction.  The three lumens aspirated and flushed well.  The dialysis lumens were flushed with the proper amount of heparin.  Catheter was sutured to the skin. Fluoroscopic and ultrasound images were taken and saved for documentation.  Findings:Catheter tip at the cavoatrial junction.  Complications: None  Impression:Placement of a right jugular Trialysis catheter with fluoroscopic and ultrasound guidance.   Original Report Authenticated By: Richarda Overlie, M.D.   Dg Chest Portable 1 View  12/30/2012   *RADIOLOGY REPORT*  Clinical Data: Shortness of breath.  Hypotension.  PORTABLE CHEST - 1 VIEW  Comparison: 12/05/2012.  Findings: Stable enlarged cardiac silhouette.  Clear lungs with normal vascularity.  Thoracic spine and bilateral shoulder degenerative changes.  IMPRESSION: No acute abnormality.  Stable cardiomegaly.   Original Report Authenticated By: Beckie Salts, M.D.   Medications: .  furosemide (LASIX) infusion 10 mg/hr (01/01/13 0640)  . heparin 10,000 units/ 20 mL infusion syringe 1,450 Units/hr (01/01/13 0946)  . phenylephrine (NEO-SYNEPHRINE) Adult infusion Stopped (01/01/13 0600)  . dialysis replacement fluid (prismasate) 300 mL/hr at 12/31/12 1719  . dialysis replacement fluid (prismasate) 200 mL/hr at 12/31/12 1719  . dialysate (PRISMASATE) 1,000 mL/hr at 01/01/13 1042   . allopurinol  150 mg Oral Daily  . doxycycline  100 mg Oral Q12H  . insulin aspart  0-9 Units Subcutaneous TID WC  . insulin aspart protamine- aspart  10 Units Subcutaneous Q breakfast  . insulin aspart protamine- aspart  5 Units Subcutaneous Q supper  . pantoprazole  40 mg Oral Daily  . phenytoin  300 mg Oral QHS  . potassium chloride SA  20 mEq Oral BID  . simvastatin  10 mg Oral QHS  . sodium chloride  3 mL Intravenous Q12H  . Warfarin - Pharmacist Dosing Inpatient   Does not apply q1800    I  have reviewed scheduled and prn medications.  ASSESSMENT/RECOMMENDATIONS:  80 yo AAF with DM2, history of CVA, Sz disorder, prior hypertension, baseline CKD3 (never worked up) and diastolic/right sided CHF with HOCM who presents with anasarca and acute renal failure in the setting of decompensated CHF, hypotension and is refractory to diuretics.  Acute kidney injury on chronic with significant cardiorenal syndrome and diffuse anasarca Oliguric on lasix drip Tolerating CRRT -50 hour with BP's 90's but has not even been on for 24 hours so no real change in exam so far. Lytes OK  Continue CRRT -50/hour for now Stop lasix drip If cannot keep SBP above 90 could restart phenylephrine (but nursing felt was not effective during the day or night yesterday) This will be VERY difficult situation if she has no renal recovery as she would be unlikely to tolerate conventional hemodialysis  Camille Bal, MD [facility] [phone] pager 01/01/2013, 10:55 AM

## 2013-01-01 NOTE — Progress Notes (Signed)
Subjective:  No real complaints besides feeling cold on CVVHD. No subjective SOB, but destas to ~80% on Julian -- up to 90s on Venti Mask.  Had HD catheter placed in RIJ - started on CVVHD - 50 ml/hr out. BPs ranging from 70s to ~120s systolic per RN. UOP ~10-52ml/hr Phenylephrine stopped  Objective:  Vital Signs in the last 24 hours: Temp:  [96.7 F (35.9 C)-98.6 F (37 C)] 96.8 F (36 C) (06/19 0730) Pulse Rate:  [49-63] 60 (06/19 0700) Resp:  [8-24] 18 (06/19 0700) BP: (52-128)/(12-96) 85/49 mmHg (06/19 0700) SpO2:  [87 %-100 %] 100 % (06/19 0700) FiO2 (%):  [50 %] 50 % (06/19 0421) Weight:  [259 lb 7.7 oz (117.7 kg)] 259 lb 7.7 oz (117.7 kg) (06/19 0603)  Intake/Output from previous day: 06/18 0701 - 06/19 0700 In: 1057.9 [I.V.:1057.9] Out: 1720 [Urine:522] Intake/Output from this shift: Total I/O In: 20 [I.V.:20] Out: 63 [Other:63]  Physical Exam: General appearance: alert, cooperative, mild distress and on O2 by FM; answers ?s appropriately Neck: JVD - 4 cm above sternal notch, no adenopathy, no carotid bruit and supple, symmetrical, trachea midline Lungs: clear to auscultation bilaterally, normal percussion bilaterally and non-labored Heart: irregularly irregular rhythm, S1, S2 normal and systolic murmur: late systolic 1/6, harsh at 2nd right intercostal space Abdomen: distended with + fluid wave & pitting edema Extremities: edema bilateral compression wraps in place per [facility] and venous stasis dermatitis / mild chronic cellulitis Pulses: weak, but palpable (difficult with edema in LE) Skin: venous stasis dermatitis Neurologic: Grossly normal  Lab Results:  Recent Labs  12/30/12 1652 01/01/13 0345  WBC 8.6 10.4  HGB 12.9 12.8  PLT 205 217    Recent Labs  12/31/12 1533 01/01/13 0345  NA 134* 136  K 4.4 4.4  CL 95* 99  CO2 26 25  GLUCOSE 104* 86  BUN 60* 46*  CREATININE 3.48* 2.61*    Recent Labs  12/31/12 0440 12/31/12 0930  TROPONINI <0.30 <0.30   Pro BNP - 21,012 Hepatic Function Panel  Recent Labs  12/31/12 0440  01/01/13 0345  PROT 6.3  --   --   ALBUMIN 2.8*  < > 2.7*  AST 36  --   --   ALT 26  --   --   ALKPHOS 101  --   --   BILITOT 0.4  --   --   < > = values in this interval not displayed.  Imaging: Imaging results have been reviewed; no new studies  Cardiac Studies:  Assessment/Plan:  Principal Problem:    Acute on chronic diastolic CHF (congestive heart failure), NYHA class 3, 09/25/12 Active Problems:    Hypertrophic obstructive cardiomyopathy(425.11)    CKD (chronic kidney disease) stage 3, GFR 30-59 ml/min    Cardiogenic shock    Sleep apnea on c-pap     Chronic venous insufficiency with LE ulcers, will be followed at wound center    Diabetes mellitus type 2 in obese    Aortic insufficiency: mild    History of Rt brain CVA    Venous stasis of lower extremity    Cardiorenal syndrome with renal failure  Very difficult situation with Acute on Chronic Renal Failure - multifactorial as per Drs. Hilty & Eliott Nine -- appreciate Nephrology & IR assistance with getting CVVHD initiated -- she is ~20 lb of fluid +.  BP will not allow for aggressive volume removal.    HOCM with LVOT gradient would make Inotropic support unfavorable, not much  additional benefit was noted on Phenylephrine -- would defer to Nephrology re: need for additional BP to safely continue CVVHD.   Venous Stasis ulcers - on bactrim & Doxycycline, dressings per [facility]; appreciate their assistance.  Glucose levels, if anything have been low -- will d/y sulfonylurea  Continue statin  Will check phenytoin levels per pharmacy recommendations.  PPI for GI prohylaxis.  Keep in CCU while on CVVHD.  CRITICAL CARE:  The patient is critically ill with multi-organ system failure and requires high complexity decision making for assessment and support, frequent evaluation and titration of therapies, application of advanced monitoring  technologies and extensive interpretation of multiple databases.  40 min   LOS: 2 days    Shafiq Larch W 01/01/2013, 8:25 AM

## 2013-01-01 NOTE — ED Provider Notes (Signed)
I have personally seen and examined the patient.  I have discussed the plan of care with the resident.  I have reviewed the documentation on PMH/FH/Soc. History.  I have reviewed the documentation of the resident and agree.  I have reviewed and agree with the ECG interpretation(s) documented by the resident.  Pt stable in the ED.  SBP improved during ED course Admitted to cardiology   Joya Gaskins, MD 01/01/13 [phone]

## 2013-01-01 NOTE — Progress Notes (Signed)
ANTICOAGULATION CONSULT NOTE - Initial Consult  Pharmacy Consult for warfarin Indication: hx DVT, afib  No Known Allergies  Patient Measurements: Height: 5\' 5"  (165.1 cm) Weight: 259 lb 7.7 oz (117.7 kg) IBW/kg (Calculated) : 57  Vital Signs: Temp: 96.8 F (36 C) (06/19 0730) Temp src: Axillary (06/19 0730) BP: 116/64 mmHg (06/19 1000) Pulse Rate: 64 (06/19 1000)  Labs:  Recent Labs  12/30/12 1652 12/30/12 2115 12/31/12 0440 12/31/12 0930 12/31/12 1533 01/01/13 0345  HGB 12.9  --   --   --   --  12.8  HCT 38.8  --   --   --   --  39.5  PLT 205  --   --   --   --  217  APTT  --   --   --   --   --  50*  LABPROT 29.8*  --  28.0*  --   --  41.4*  INR 3.03*  --  2.79*  --   --  4.73*  CREATININE 3.18*  --  3.30*  --  3.48* 2.61*  TROPONINI  --  <0.30 <0.30 <0.30  --   --     Estimated Creatinine Clearance: 23.2 ml/min (by C-G formula based on Cr of 2.61).   Medical History: Past Medical History  Diagnosis Date  . Hypertension   . Venous stasis of lower extremity WEARS TEDS    BLE--  CURRENTLY NO ULCERS/ WOUNDS  . Aortic insufficiency     mild/H&P 04/02/2012  . Type II diabetes mellitus   . OSA on CPAP   . Pulmonary hypertension per last echo 03-28-2012  mod.-severe     per dr wert note this related to L ht pressures from diastolic chf  . Hypertrophic obstructive cardiomyopathy(425.11)   . Arthritis   . CKD (chronic kidney disease) stage 3, GFR 30-59 ml/min   . Heart murmur   . History of DVT of lower extremity lower right leg nov 2012  . Hyperlipidemia   . History of gout 06-09-2012  per pt stable  . Chronic diastolic CHF (congestive heart failure), NYHA class 3 CARDIOLOGIST- DR HILTY--  LOV OCT 2013--  REQUESTED NOTE, EGK AND STRESS TEST    PER PT ON 06-09-2012  S&S OF CHF AND WT IS DOWN  . H/O hiatal hernia   . GERD (gastroesophageal reflux disease)   . Lower extremity edema   . PAF (paroxysmal atrial fibrillation), in SR on admit 09/25/2012  . Stroke  Jan 2014    Rt brain, Nl CA doppler  . TIA (transient ischemic attack) 08/07/2012    2D Echo - EF 60-65, severe concentric hypertrophy in the left ventricle  . Dyspnea on exertion 11/30/2010    Gated Stress Dipyridamole Myocardial Perfusion - post stress EF-63, normal study, no ischemia demonstrated     Medications:  Prescriptions prior to admission  Medication Sig Dispense Refill  . allopurinol (ZYLOPRIM) 300 MG tablet Take 150 mg by mouth daily.      . cloNIDine (CATAPRES) 0.2 MG tablet Take 0.2 mg by mouth 2 (two) times daily.       . furosemide (LASIX) 40 MG tablet Take 40 mg by mouth 2 (two) times daily.      Marland Kitchen glimepiride (AMARYL) 4 MG tablet Take 4 mg by mouth daily before breakfast.       . insulin lispro protamine-insulin lispro (HUMALOG 75/25) (75-25) 100 UNIT/ML SUSP Inject 5-10 Units into the skin 2 (two) times daily with a meal.  10 units with breakfast and 5 units at dinner      . isosorbide-hydrALAZINE (BIDIL) 20-37.5 MG per tablet Take 1 tablet by mouth 3 (three) times daily.      . metolazone (ZAROXOLYN) 2.5 MG tablet 2.5 mg. Take one tab 3 times a week as needed based on weight- if weight is up 5lbs, take one tablet      . metoprolol succinate (TOPROL-XL) 25 MG 24 hr tablet Take 37.5 mg by mouth 2 (two) times daily.      Marland Kitchen omeprazole (PRILOSEC) 20 MG capsule Take 20 mg by mouth every morning.       . phenytoin (DILANTIN) 300 MG ER capsule Take 1 capsule (300 mg total) by mouth at bedtime.  30 capsule  2  . potassium chloride SA (K-DUR,KLOR-CON) 20 MEQ tablet Take 20 mEq by mouth 2 (two) times daily.      Marland Kitchen senna-docusate (SENOKOT-S) 8.6-50 MG per tablet Take 1 tablet by mouth at bedtime as needed for constipation.      . simvastatin (ZOCOR) 10 MG tablet Take 10 mg by mouth at bedtime.      . sulfamethoxazole-trimethoprim (BACTRIM DS) 800-160 MG per tablet Take 1 tablet by mouth 2 (two) times daily.      Marland Kitchen warfarin (COUMADIN) 2.5 MG tablet Take 1.25-2.5 mg by mouth every  evening. 2.5mg /day except take 1.25mg  on MF        Assessment: 31 yof  admitted 12/30/2012  With SOB and LE edema.  Patient with hx DVT and afib pharmacy consulted to continue on warfarin.   Events: HOCUM, very fluid overloaded, CVV started to remove fluid, trying to minimize pressor support.  PMH: HTN, venous stasis, DM, OSA, pulm HTN, CM, OA, CKD, DVT, HLD, gout, CHF (EF) , afib, CVA, HOCUM,   AC: hx DVT, afib - INR 4.73, CBC WNL as of 6/17, no bleeding noted - ongoing CVV,  ID: Doxy for cellulitis (Bactrim PTA for leg ulceration) - Hypothermic,, WBC WNL 6/18 Doxycycline>> 6/18 urine NG  CV: Hx HTN, pulm HTN, CM, HLD, CHF (EF 60-65%), afib - BP 110s/60s, HF 60s-  simva,  (PTA lasix, bidil, metolazone, toprol)  Endo: Hx DM - CBGs good on home 70/30 + glimepiride, glucose < 150; Gout, allopurinol  GI/Nutr: Cardiac diet - PPI, LFTs WNL  Neuro: Hx CVA, ?seizure - phenytoin level undetectable, resumed home dose, follow up DPH levels  Renal: Hx CKD, gout - Scr 2.61, Na 133, lytes ok, allopurinol -   Pulm: Hx OSA - 98% 0.5Fio2 VM  Heme/Onc: CBC WNL as of 6/17  Best practices: warfarin  Goal of Therapy:  INR 2-3   Plan:  1. No Warfarin today 2. Daily INR  Thank you for allowing pharmacy to be a part of this patients care team.  Lovenia Kim Pharm.D., BCPS Clinical Pharmacist 01/01/2013 11:01 AM Pager: [phone] Phone: [phone]

## 2013-01-01 NOTE — Progress Notes (Signed)
Patient is currently active with long-term disease management services with [facility] as a benefit of [employer]. She is also active with [facility] care. [facility] Care Management services does not replace home health. Patient will receive a post discharge transition of care call and evaluated for continued monthly home visits for assessments and for education. Made inpatient RNCM aware that [facility] active.  Raiford Noble MSN-Ed, RN,BSN, [facility], [phone]

## 2013-01-01 NOTE — Telephone Encounter (Signed)
Tiffany is calling about orders and plan of care forms for Dr.Hilty to sign.Marland KitchenMarland KitchenPlease call

## 2013-01-01 NOTE — Telephone Encounter (Signed)
Returned call.  Left message that no orders received.  Asked to refax to nurses station.

## 2013-01-02 DIAGNOSIS — G4733 Obstructive sleep apnea (adult) (pediatric): Secondary | ICD-10-CM

## 2013-01-02 LAB — RENAL FUNCTION PANEL
Albumin: 2.6 g/dL — ABNORMAL LOW (ref 3.5–5.2)
CO2: 27 mEq/L (ref 19–32)
Chloride: 100 mEq/L (ref 96–112)
Chloride: 101 mEq/L (ref 96–112)
Creatinine, Ser: 2.11 mg/dL — ABNORMAL HIGH (ref 0.50–1.10)
Creatinine, Ser: 1.95 mg/dL — ABNORMAL HIGH (ref 0.50–1.10)
GFR calc Af Amer: 27 mL/min — ABNORMAL LOW (ref >90)
GFR calc non Af Amer: 21 mL/min — ABNORMAL LOW (ref >90)
GFR calc non Af Amer: 24 mL/min — ABNORMAL LOW (ref >90)
Potassium: 5.2 mEq/L — ABNORMAL HIGH (ref 3.5–5.1)
Sodium: 135 mEq/L (ref 135–145)

## 2013-01-02 LAB — POCT ACTIVATED CLOTTING TIME
Activated Clotting Time: 181 seconds
Activated Clotting Time: 186 seconds
Activated Clotting Time: 191 seconds
Activated Clotting Time: 196 seconds
Activated Clotting Time: 196 seconds
Activated Clotting Time: 196 seconds

## 2013-01-02 LAB — GLUCOSE, CAPILLARY
Glucose-Capillary: 126 mg/dL — ABNORMAL HIGH (ref 70–99)
Glucose-Capillary: 97 mg/dL (ref 70–99)

## 2013-01-02 MED ORDER — BIOTENE DRY MOUTH MT LIQD
15.0000 mL | Freq: Two times a day (BID) | OROMUCOSAL | Status: DC
  Administered 2013-01-02 – 2013-01-23 (×37): 15 mL via OROMUCOSAL

## 2013-01-02 NOTE — Progress Notes (Signed)
Subjective: No complaints other than some superficial RLQ pain.  She reports sleeping very well.  Objective: Vital signs in last 24 hours: Temp:  [97 F (36.1 C)-98.1 F (36.7 C)] 98.1 F (36.7 C) (06/20 0726) Pulse Rate:  [63-80] 75 (06/20 0700) Resp:  [9-22] 22 (06/20 0700) BP: (66-139)/(25-111) 99/69 mmHg (06/20 0700) SpO2:  [89 %-100 %] 92 % (06/20 0700) FiO2 (%):  [50 %] 50 % (06/20 0400) Weight:  [258 lb 2.5 oz (117.1 kg)] 258 lb 2.5 oz (117.1 kg) (06/20 0500) Last BM Date: 12/31/12  Intake/Output from previous day: 06/19 0701 - 06/20 0700 In: 880 [P.O.:800; I.V.:80] Out: 1636 [Urine:153] Intake/Output this shift:    Medications Current Facility-Administered Medications  Medication Dose Route Frequency Provider Last Rate Last Dose  . 0.9 %  sodium chloride infusion  250 mL Intravenous PRN Brittainy Simmons, PA-C 10 mL/hr at 12/31/12 2100 250 mL at 12/31/12 2100  . acetaminophen (TYLENOL) tablet 650 mg  650 mg Oral Q4H PRN Brittainy Simmons, PA-C      . allopurinol (ZYLOPRIM) tablet 150 mg  150 mg Oral Daily Brittainy Simmons, PA-C   150 mg at 01/01/13 0957  . ALPRAZolam (XANAX) tablet 0.25 mg  0.25 mg Oral BID PRN Brittainy Simmons, PA-C      . doxycycline (VIBRA-TABS) tablet 100 mg  100 mg Oral Q12H Sadie Haber, MD   100 mg at 01/01/13 2141  . heparin 10,000 units/ 20 mL infusion syringe  250-3,000 Units/hr CRRT Continuous Sadie Haber, MD 3.4 mL/hr at 01/02/13 0050 1,700 Units/hr at 01/02/13 0050  . heparin bolus via infusion syringe 1,000 Units  1,000 Units CRRT PRN Sadie Haber, MD      . heparin injection 1,000-6,000 Units  1,000-6,000 Units CRRT PRN Sadie Haber, MD      . heparinized saline (2000 units/L) primer fluid for CRRT   CRRT PRN Sadie Haber, MD      . insulin aspart (novoLOG) injection 0-9 Units  0-9 Units Subcutaneous TID WC Chrystie Nose, MD   3 Units at 01/01/13 1625  . insulin aspart protamine- aspart (NOVOLOG 70/30)  injection 10 Units  10 Units Subcutaneous Q breakfast Chrystie Nose, MD   10 Units at 01/01/13 0957  . insulin aspart protamine- aspart (NOVOLOG 70/30) injection 5 Units  5 Units Subcutaneous Q supper Chrystie Nose, MD   5 Units at 01/01/13 1625  . ondansetron (ZOFRAN) injection 4 mg  4 mg Intravenous Q6H PRN Brittainy Simmons, PA-C      . pantoprazole (PROTONIX) EC tablet 40 mg  40 mg Oral Daily Brittainy Simmons, PA-C   40 mg at 01/01/13 0957  . phenylephrine (NEO-SYNEPHRINE) 40,000 mcg in dextrose 5 % 250 mL infusion  30-200 mcg/min Intravenous Titrated Chrystie Nose, MD   60 mcg/min at 12/31/12 1825  . phenytoin (DILANTIN) ER capsule 300 mg  300 mg Oral QHS Brittainy Simmons, PA-C   300 mg at 01/01/13 2140  . potassium chloride SA (K-DUR,KLOR-CON) CR tablet 20 mEq  20 mEq Oral BID Brittainy Simmons, PA-C   20 mEq at 01/01/13 2141  . prismasol BGK 4/2.5 5,000 mL dialysis replacement fluid   CRRT Continuous Sadie Haber, MD 300 mL/hr at 01/02/13 0520    . prismasol BGK 4/2.5 5,000 mL dialysis replacement fluid   CRRT Continuous Sadie Haber, MD 200 mL/hr at 01/01/13 2037    . prismasol BGK 4/2.5 5,000 mL dialysis solution   CRRT  Continuous Sadie Haber, MD 1,000 mL/hr at 01/02/13 0155    . senna-docusate (Senokot-S) tablet 1 tablet  1 tablet Oral QHS PRN Brittainy Simmons, PA-C      . simvastatin (ZOCOR) tablet 10 mg  10 mg Oral QHS Brittainy Simmons, PA-C   10 mg at 01/01/13 2141  . sodium chloride 0.9 % injection 3 mL  3 mL Intravenous Q12H Brittainy Simmons, PA-C   3 mL at 01/01/13 2145  . sodium chloride 0.9 % injection 3 mL  3 mL Intravenous PRN Brittainy Simmons, PA-C      . Warfarin - Pharmacist Dosing Inpatient   Does not apply q1800 Drake Leach Rumbarger, RPH      . zolpidem (AMBIEN) tablet 5 mg  5 mg Oral QHS PRN Robbie Lis, PA-C        PE: General appearance: alert, cooperative and no distress Lungs: deminished BS bilaterally. Heart: regular rate and  rhythm, S1, S2 normal, no murmur, click, rub or gallop Abdomen: +BS, mildly tender ~RLQ but more so at the level of the dermis.  Extremities: 2+ LEE above knnes.  LE wrapped. Pulses: 2+ and symmetric Neurologic: Grossly normal  Lab Results:   Recent Labs  12/30/12 1652 01/01/13 0345  WBC 8.6 10.4  HGB 12.9 12.8  HCT 38.8 39.5  PLT 205 217   BMET  Recent Labs  01/01/13 0345 01/01/13 1500 01/02/13 0345  NA 136 132* 135  K 4.4 4.8 5.2*  CL 99 96 100  CO2 25 26 27   GLUCOSE 86 147* 108*  BUN 46* 39* 33*  CREATININE 2.61* 2.49* 2.11*  CALCIUM 9.0 8.4 8.8   PT/INR  Recent Labs  12/31/12 0440 01/01/13 0345 01/02/13 0345  LABPROT 28.0* 41.4* 41.0*  INR 2.79* 4.73* 4.67*    Assessment/Plan  Principal Problem:   Acute on chronic diastolic CHF (congestive heart failure), NYHA class 3, 09/25/12 Active Problems:   Sleep apnea on c-pap    Aortic insufficiency: mild   Chronic venous insufficiency with LE ulcers, will be followed at wound center   Diabetes mellitus type 2 in obese   Hypertrophic obstructive cardiomyopathy(425.11)   CKD (chronic kidney disease) stage 3, GFR 30-59 ml/min   History of Rt brain CVA   Venous stasis of lower extremity   Cardiorenal syndrome with renal failure   Cardiogenic shock  Plan:  CRRT.  Net fluids:  -0.76L/-1.3L(adm).  SCr has improved slightly.  Min UOP in foley cath.  INR supratherapeutic.  K+ mildly elevated.  BP better this AM and is off phenlephrine.    LOS: 3 days    HAGER, BRYAN 01/02/2013 7:53 AM  I have seen and examined the patient along with Wilburt Finlay, PA.  I have reviewed the chart, notes and new data.  I agree with PA's note.  Key new complaints: states abdomen is a little tender Key examination changes: no worsening abdominal tenderness even with deep palpation Key new findings / data: echo shows a hyperdynamic LV and a mildly dilated RV (surprisingly, with normal function?), moderate pulmonary HTN and high RA  and LA pressure  PLAN: The dominant problem appears to be severe hypervolemia. Inotropes are unlikely to help and would probably be deleterious. Similarly, vasodilators are clearly not tolerated and may be deleterious in view of what appears to be restrictive physiology with limited stroke volume.  Thurmon Fair, MD, [facility] [phone] 01/02/2013, 8:33 AM

## 2013-01-02 NOTE — Progress Notes (Signed)
[facility] ROUNDING NOTE   ASSESSMENT/RECOMMENDATIONS:  [name] with DM2, history of CVA, Sz disorder, prior hypertension, baseline CKD3 (never worked up) and diastolic/right sided CHF with HOCM who presents with anasarca and acute renal failure in the setting of decompensated CHF, hypotension and is refractory to IV diuretics.  Acute kidney injury on chronic with significant cardiorenal syndrome and diffuse anasarca Oliguric  Tolerating CRRT -50 hour via right ij temp cath (6/18) BP 120's now so increase net neg UF to -75/hour Spoke with RN's about weighing with 1 pillow, 1 blanket, removing other items from bed so can get a better idea about weight loss with CRRT Stop oral potassium  This will be VERY difficult situation if she has no renal recovery as she would be unlikely to tolerate conventional hemodialysis unless BP shows some substantial improvement    Subjective:  CRRT since late 6/18 So far tolerating -50/hour off Phenylephrine remains off (did not help) Low urine output BP up some to 120's Weight has not changed but I think a problem with the way she is being weighed  Objective Vital signs in last 24 hours: Filed Vitals:   01/02/13 0800 01/02/13 0900 01/02/13 1000 01/02/13 1100  BP: 123/58 129/81 136/33 121/58  Pulse: 74 79 52 73  Temp:      TempSrc:      Resp: 23 17 23 27   Height:      Weight:      SpO2: 89% 92% 91% 94%   Weight change: -0.6 kg (-1 lb 5.2 oz)  Intake/Output Summary (Last 24 hours) at 01/02/13 1138 Last data filed at 01/02/13 1100  Gross per 24 hour  Intake    803 ml  Output   1854 ml  Net  -1051 ml   Physical Exam:  BP 121/58  Pulse 73  Temp(Src) 98.1 F (36.7 C) (Axillary)  Resp 27  Ht 5\' 5"  (1.651 m)  Wt 117.1 kg (258 lb 2.5 oz)  BMI 42.96 kg/m2  SpO2 94% Very nice soft spoken AAF Not dyspneic On venti mask VS as noted  JVP to jaw angle with crackles at lung bases  Abd wall edema + BS not tender  2+ thigh  edema  Legs wrapped - wraps not removed but areas of LE's above and below wraps with 2+ edema/woody changes Overall not much change in anasarca  Weights: 01/02/13 0500 117.1 kg 01/01/13 0603 117.7 kg   12/31/12 0015 115.5 kg   Labs: Basic Metabolic Panel:  Recent Labs Lab 12/30/12 1652 12/31/12 0440 12/31/12 1533 01/01/13 0345 01/01/13 1500 01/02/13 0345  NA 135 133* 134* 136 132* 135  K 4.1 4.3 4.4 4.4 4.8 5.2*  CL 95* 94* 95* 99 96 100  CO2 26 25 26 25 26 27   GLUCOSE 143* 141* 104* 86 147* 108*  BUN 56* 60* 60* 46* 39* 33*  CREATININE 3.18* 3.30* 3.48* 2.61* 2.49* 2.11*  CALCIUM 9.4 9.5 8.8 9.0 8.4 8.8  PHOS  --   --  4.7* 4.1 3.1 2.8   Liver Function Tests:  Recent Labs Lab 12/31/12 0440  01/01/13 0730 01/01/13 1500 01/02/13 0345  AST 36  --  39*  --   --   ALT 26  --  22  --   --   ALKPHOS 101  --  91  --   --   BILITOT 0.4  --  0.5  --   --   PROT 6.3  --  5.9*  --   --  ALBUMIN 2.8*  < > 2.5* 2.6* 2.6*  < > = values in this interval not displayed.  Recent Labs Lab 12/30/12 1652 01/01/13 0345  WBC 8.6 10.4  NEUTROABS 6.4  --   HGB 12.9 12.8  HCT 38.8 39.5  MCV 93.3 93.8  PLT 205 217    Recent Labs Lab 12/30/12 2115 12/31/12 0440 12/31/12 0930  TROPONINI <0.30 <0.30 <0.30   CBG:  Recent Labs Lab 01/01/13 1219 01/01/13 1618 01/01/13 2215 01/02/13 0328 01/02/13 0725  GLUCAP 56* 202* 70 104* 98    Iron Studies: No results found for this basename: IRON, TIBC, TRANSFERRIN, FERRITIN,  in the last 168 hours Studies/Results: Ir Fluoro Guide Cv Line Right  12/31/2012   *RADIOLOGY REPORT*  Clinical history:Acute renal failure and needs hemodialysis.  PROCEDURE(S): PLACEMENT OF A NON TUNNELED DIALYSIS CATHETER WITH ULTRASOUND AND FLUOROSCOPIC GUIDANCE  Physician: Rachelle Hora. Henn, MD  Medications:None  Moderate sedation time:None  Fluoroscopy time: 30 seconds  Procedure:The procedure was explained to the patient.  The risks and benefits of the  procedure were discussed and the patient's questions were addressed.  Informed consent was obtained from the patient.  Ultrasound demonstrated a patent right internal jugular vein.  The right side of the neck was prepped and draped in a sterile fashion.  Maximal barrier sterile technique was utilized including caps, mask, sterile gowns, sterile gloves, sterile drape, hand hygiene and skin antiseptic.  The skin was anesthetized with lidocaine.  21 gauge needle was directed into the right internal jugular vein with ultrasound guidance.  Micropuncture dilator set was placed.  The tract was dilated over an Amplatz wire.  A 20 cm Trialysis catheter was advanced over the Amplatz wire and positioned at the cavoatrial junction.  The three lumens aspirated and flushed well.  The dialysis lumens were flushed with the proper amount of heparin.  Catheter was sutured to the skin. Fluoroscopic and ultrasound images were taken and saved for documentation.  Findings:Catheter tip at the cavoatrial junction.  Complications: None  Impression:Placement of a right jugular Trialysis catheter with fluoroscopic and ultrasound guidance.   Original Report Authenticated By: Richarda Overlie, M.D.   Ir US Guide Vasc Access Right  12/31/2012   *RADIOLOGY REPORT*  Clinical history:Acute renal failure and needs hemodialysis.  PROCEDURE(S): PLACEMENT OF A NON TUNNELED DIALYSIS CATHETER WITH ULTRASOUND AND FLUOROSCOPIC GUIDANCE  Physician: Rachelle Hora. Henn, MD  Medications:None  Moderate sedation time:None  Fluoroscopy time: 30 seconds  Procedure:The procedure was explained to the patient.  The risks and benefits of the procedure were discussed and the patient's questions were addressed.  Informed consent was obtained from the patient.  Ultrasound demonstrated a patent right internal jugular vein.  The right side of the neck was prepped and draped in a sterile fashion.  Maximal barrier sterile technique was utilized including caps, mask, sterile gowns,  sterile gloves, sterile drape, hand hygiene and skin antiseptic.  The skin was anesthetized with lidocaine.  21 gauge needle was directed into the right internal jugular vein with ultrasound guidance.  Micropuncture dilator set was placed.  The tract was dilated over an Amplatz wire.  A 20 cm Trialysis catheter was advanced over the Amplatz wire and positioned at the cavoatrial junction.  The three lumens aspirated and flushed well.  The dialysis lumens were flushed with the proper amount of heparin.  Catheter was sutured to the skin. Fluoroscopic and ultrasound images were taken and saved for documentation.  Findings:Catheter tip at the cavoatrial junction.  Complications:  None  Impression:Placement of a right jugular Trialysis catheter with fluoroscopic and ultrasound guidance.   Original Report Authenticated By: Richarda Overlie, M.D.   Medications: . heparin 10,000 units/ 20 mL infusion syringe 1,700 Units/hr (01/02/13 1100)  . phenylephrine (NEO-SYNEPHRINE) Adult infusion Stopped (01/01/13 0600)  . dialysis replacement fluid (prismasate) 300 mL/hr at 01/02/13 0520  . dialysis replacement fluid (prismasate) 200 mL/hr at 01/01/13 2037  . dialysate (PRISMASATE) 1,000 mL/hr at 01/02/13 0155   . allopurinol  150 mg Oral Daily  . antiseptic oral rinse  15 mL Mouth Rinse BID  . doxycycline  100 mg Oral Q12H  . insulin aspart  0-9 Units Subcutaneous TID WC  . insulin aspart protamine- aspart  10 Units Subcutaneous Q breakfast  . insulin aspart protamine- aspart  5 Units Subcutaneous Q supper  . pantoprazole  40 mg Oral Daily  . phenytoin  300 mg Oral QHS  . simvastatin  10 mg Oral QHS  . sodium chloride  3 mL Intravenous Q12H  . Warfarin - Pharmacist Dosing Inpatient   Does not apply q1800    I  have reviewed scheduled and prn medications. Camille Bal, MD [facility] [phone] pager 01/02/2013, 11:38 AM

## 2013-01-02 NOTE — Progress Notes (Signed)
ANTICOAGULATION CONSULT NOTE - Follow-up Consult  Pharmacy Consult for warfarin Indication: hx DVT, afib  No Known Allergies  Patient Measurements: Height: 5\' 5"  (165.1 cm) Weight: 258 lb 2.5 oz (117.1 kg) IBW/kg (Calculated) : 57  Vital Signs: Temp: 98.1 F (36.7 C) (06/20 0726) Temp src: Axillary (06/20 0726) BP: 136/33 mmHg (06/20 1000) Pulse Rate: 52 (06/20 1000)  Labs:  Recent Labs  12/30/12 1652 12/30/12 2115 12/31/12 0440 12/31/12 0930  01/01/13 0345 01/01/13 1500 01/02/13 0345  HGB 12.9  --   --   --   --  12.8  --   --   HCT 38.8  --   --   --   --  39.5  --   --   PLT 205  --   --   --   --  217  --   --   APTT  --   --   --   --   --  50*  --   --   LABPROT 29.8*  --  28.0*  --   --  41.4*  --  41.0*  INR 3.03*  --  2.79*  --   --  4.73*  --  4.67*  CREATININE 3.18*  --  3.30*  --   < > 2.61* 2.49* 2.11*  TROPONINI  --  <0.30 <0.30 <0.30  --   --   --   --   < > = values in this interval not displayed.  Estimated Creatinine Clearance: 28.6 ml/min (by C-G formula based on Cr of 2.11).   Medical History: Past Medical History  Diagnosis Date  . Hypertension   . Venous stasis of lower extremity WEARS TEDS    BLE--  CURRENTLY NO ULCERS/ WOUNDS  . Aortic insufficiency     mild/H&P 04/02/2012  . Type II diabetes mellitus   . OSA on CPAP   . Pulmonary hypertension per last echo 03-28-2012  mod.-severe     per dr wert note this related to L ht pressures from diastolic chf  . Hypertrophic obstructive cardiomyopathy(425.11)   . Arthritis   . CKD (chronic kidney disease) stage 3, GFR 30-59 ml/min   . Heart murmur   . History of DVT of lower extremity lower right leg nov 2012  . Hyperlipidemia   . History of gout 06-09-2012  per pt stable  . Chronic diastolic CHF (congestive heart failure), NYHA class 3 CARDIOLOGIST- DR HILTY--  LOV OCT 2013--  REQUESTED NOTE, EGK AND STRESS TEST    PER PT ON 06-09-2012  S&S OF CHF AND WT IS DOWN  . H/O hiatal hernia   .  GERD (gastroesophageal reflux disease)   . Lower extremity edema   . PAF (paroxysmal atrial fibrillation), in SR on admit 09/25/2012  . Stroke Jan 2014    Rt brain, Nl CA doppler  . TIA (transient ischemic attack) 08/07/2012    2D Echo - EF 60-65, severe concentric hypertrophy in the left ventricle  . Dyspnea on exertion 11/30/2010    Gated Stress Dipyridamole Myocardial Perfusion - post stress EF-63, normal study, no ischemia demonstrated     Medications:  Prescriptions prior to admission  Medication Sig Dispense Refill  . allopurinol (ZYLOPRIM) 300 MG tablet Take 150 mg by mouth daily.      . cloNIDine (CATAPRES) 0.2 MG tablet Take 0.2 mg by mouth 2 (two) times daily.       . furosemide (LASIX) 40 MG tablet Take 40 mg by mouth  2 (two) times daily.      Marland Kitchen glimepiride (AMARYL) 4 MG tablet Take 4 mg by mouth daily before breakfast.       . insulin lispro protamine-insulin lispro (HUMALOG 75/25) (75-25) 100 UNIT/ML SUSP Inject 5-10 Units into the skin 2 (two) times daily with a meal. 10 units with breakfast and 5 units at dinner      . isosorbide-hydrALAZINE (BIDIL) 20-37.5 MG per tablet Take 1 tablet by mouth 3 (three) times daily.      . metolazone (ZAROXOLYN) 2.5 MG tablet 2.5 mg. Take one tab 3 times a week as needed based on weight- if weight is up 5lbs, take one tablet      . metoprolol succinate (TOPROL-XL) 25 MG 24 hr tablet Take 37.5 mg by mouth 2 (two) times daily.      Marland Kitchen omeprazole (PRILOSEC) 20 MG capsule Take 20 mg by mouth every morning.       . phenytoin (DILANTIN) 300 MG ER capsule Take 1 capsule (300 mg total) by mouth at bedtime.  30 capsule  2  . potassium chloride SA (K-DUR,KLOR-CON) 20 MEQ tablet Take 20 mEq by mouth 2 (two) times daily.      Marland Kitchen senna-docusate (SENOKOT-S) 8.6-50 MG per tablet Take 1 tablet by mouth at bedtime as needed for constipation.      . simvastatin (ZOCOR) 10 MG tablet Take 10 mg by mouth at bedtime.      . sulfamethoxazole-trimethoprim (BACTRIM DS)  800-160 MG per tablet Take 1 tablet by mouth 2 (two) times daily.      Marland Kitchen warfarin (COUMADIN) 2.5 MG tablet Take 1.25-2.5 mg by mouth every evening. 2.5mg /day except take 1.25mg  on MF        Assessment: 63 yof  admitted 12/30/2012  With SOB and LE edema.  Patient with hx DVT and afib pharmacy consulted to continue on warfarin.   INR supratherapeutic today (4.67) despite only receiving 1 dose of Coumadin at her home dose on 6/18.  No bleeding or complications noted per chart notes.  Goal of Therapy:  INR 2-3   Plan:  1. No Warfarin today. 2. Daily INR  Thank you for allowing pharmacy to be a part of this patients care team.  Tad Moore, BCPS  Clinical Pharmacist Pager [phone]  01/02/2013 10:57 AM

## 2013-01-03 DIAGNOSIS — N189 Chronic kidney disease, unspecified: Secondary | ICD-10-CM

## 2013-01-03 DIAGNOSIS — I4891 Unspecified atrial fibrillation: Secondary | ICD-10-CM

## 2013-01-03 DIAGNOSIS — N179 Acute kidney failure, unspecified: Secondary | ICD-10-CM

## 2013-01-03 DIAGNOSIS — I5031 Acute diastolic (congestive) heart failure: Secondary | ICD-10-CM

## 2013-01-03 DIAGNOSIS — I1311 Hypertensive heart and chronic kidney disease without heart failure, with stage 5 chronic kidney disease, or end stage renal disease: Secondary | ICD-10-CM

## 2013-01-03 LAB — RENAL FUNCTION PANEL
Albumin: 2.6 g/dL — ABNORMAL LOW (ref 3.5–5.2)
BUN: 24 mg/dL — ABNORMAL HIGH (ref 6–23)
CO2: 28 mEq/L (ref 19–32)
Calcium: 9.1 mg/dL (ref 8.4–10.5)
Calcium: 8.7 mg/dL (ref 8.4–10.5)
Chloride: 101 mEq/L (ref 96–112)
Creatinine, Ser: 1.75 mg/dL — ABNORMAL HIGH (ref 0.50–1.10)
GFR calc Af Amer: 31 mL/min — ABNORMAL LOW (ref >90)
GFR calc Af Amer: 31 mL/min — ABNORMAL LOW (ref >90)
GFR calc non Af Amer: 27 mL/min — ABNORMAL LOW (ref >90)
GFR calc non Af Amer: 27 mL/min — ABNORMAL LOW (ref >90)
Glucose, Bld: 91 mg/dL (ref 70–99)
Phosphorus: 2.6 mg/dL (ref 2.3–4.6)
Phosphorus: 2.3 mg/dL (ref 2.3–4.6)
Potassium: 5 mEq/L (ref 3.5–5.1)
Sodium: 136 mEq/L (ref 135–145)
Sodium: 134 mEq/L — ABNORMAL LOW (ref 135–145)

## 2013-01-03 LAB — GLUCOSE, CAPILLARY: Glucose-Capillary: 211 mg/dL — ABNORMAL HIGH (ref 70–99)

## 2013-01-03 LAB — POCT ACTIVATED CLOTTING TIME
Activated Clotting Time: 201 seconds
Activated Clotting Time: 206 seconds
Activated Clotting Time: 206 seconds

## 2013-01-03 LAB — PROTIME-INR: Prothrombin Time: 38.5 seconds — ABNORMAL HIGH (ref 11.6–15.2)

## 2013-01-03 NOTE — Progress Notes (Signed)
Using incentive spirometer to 500  cc mark correctly .The patient eagerly  participates in all care .

## 2013-01-03 NOTE — Progress Notes (Signed)
[facility] ROUNDING NOTE   ASSESSMENT/RECOMMENDATIONS:  [name] with DM2, history of CVA, Sz disorder, prior hypertension, baseline CKD3 (never worked up) and diastolic/right sided CHF with HOCM who presents with anasarca and acute renal failure in the setting of decompensated CHF, hypotension and is refractory to IV diuretics.  Acute kidney injury on chronic with significant cardiorenal syndrome and massive diffuse anasarca Remains oliguric  Tolerating CRRT -75 hour via right ij temp cath (6/18) and weight down 2 kg BP stable over 100s now so increase net neg UF to -100/hour and add phenylephrine if BP drops to keep above 90 Spoke with RN's about weighing with 1 pillow, 1 blanket, removing other items from bed so can get a better idea about weight loss with CRRT Stopped oral potassium (6/20) but still mild hyperkalemia  This will be VERY difficult situation if she has no renal recovery as she would be unlikely to tolerate conventional hemodialysis unless BP shows some substantial improvement    Subjective:  CRRT since late 6/18 So far tolerating -50/hour off Phenylephrine remains off  Low urine output Weight down 2 kg but still grossly overloaded  Objective Vital signs in last 24 hours: Filed Vitals:   01/03/13 0700 01/03/13 0800 01/03/13 0900 01/03/13 0930  BP: 91/64 129/60 102/82 115/66  Pulse: 77 77 91 83  Temp:  98.3 F (36.8 C)    TempSrc:  Axillary    Resp: 20 21 17 11   Height:      Weight:      SpO2: 90% 91% 72% 93%   Weight change: -2 kg (-4 lb 6.6 oz)  Intake/Output Summary (Last 24 hours) at 01/03/13 0954 Last data filed at 01/03/13 0949  Gross per 24 hour  Intake    663 ml  Output   2397 ml  Net  -1734 ml   Physical Exam:  BP 115/66  Pulse 83  Temp(Src) 98.3 F (36.8 C) (Axillary)  Resp 11  Ht 5\' 5"  (1.651 m)  Wt 115.1 kg (253 lb 12 oz)  BMI 42.23 kg/m2  SpO2 93%  On venti mask VS as noted  JVP to jaw angle with crackles at lung  bases unchanged  Abd wall edema + BS not tender  2+ thigh edema  Legs wrapped - wraps not removed but areas of LE's above and below wraps with 2+ edema/woody changes Overall not much change in anasarca  Weights: 01/03/13 0459 115.1 kg  01/02/13 0500 117.1 kg 01/01/13 0603 117.7 kg   12/31/12 0015 115.5 kg   Labs: Basic Metabolic Panel:  Recent Labs Lab 12/31/12 0440 12/31/12 1533 01/01/13 0345 01/01/13 1500 01/02/13 0345 01/02/13 1600 01/03/13 0400  NA 133* 134* 136 132* 135 136 136  K 4.3 4.4 4.4 4.8 5.2* 5.1 5.0  CL 94* 95* 99 96 100 101 101  CO2 25 26 25 26 27 27 28   GLUCOSE 141* 104* 86 147* 108* 128* 91  BUN 60* 60* 46* 39* 33* 28* 24*  CREATININE 3.30* 3.48* 2.61* 2.49* 2.11* 1.95* 1.75*  CALCIUM 9.5 8.8 9.0 8.4 8.8 8.7 9.1  PHOS  --  4.7* 4.1 3.1 2.8 2.4 2.6   Liver Function Tests:  Recent Labs Lab 12/31/12 0440  01/01/13 0730  01/02/13 0345 01/02/13 1600 01/03/13 0400  AST 36  --  39*  --   --   --   --   ALT 26  --  22  --   --   --   --  ALKPHOS 101  --  91  --   --   --   --   BILITOT 0.4  --  0.5  --   --   --   --   PROT 6.3  --  5.9*  --   --   --   --   ALBUMIN 2.8*  < > 2.5*  < > 2.6* 2.5* 2.6*  < > = values in this interval not displayed.  Recent Labs Lab 12/30/12 1652 01/01/13 0345  WBC 8.6 10.4  NEUTROABS 6.4  --   HGB 12.9 12.8  HCT 38.8 39.5  MCV 93.3 93.8  PLT 205 217    Recent Labs Lab 12/30/12 2115 12/31/12 0440 12/31/12 0930  TROPONINI <0.30 <0.30 <0.30   CBG:  Recent Labs Lab 01/02/13 0725 01/02/13 1138 01/02/13 1609 01/02/13 2127 01/03/13 0825  GLUCAP 98 126* 142* 97 79    Iron Studies: No results found for this basename: IRON, TIBC, TRANSFERRIN, FERRITIN,  in the last 168 hours Studies/Results: No results found. Medications: . heparin 10,000 units/ 20 mL infusion syringe 1,850 Units/hr (01/03/13 0900)  . phenylephrine (NEO-SYNEPHRINE) Adult infusion Stopped (01/01/13 0600)  . dialysis replacement  fluid (prismasate) 300 mL/hr at 01/02/13 2243  . dialysis replacement fluid (prismasate) 200 mL/hr at 01/02/13 2251  . dialysate (PRISMASATE) 1,000 mL/hr at 01/03/13 0858   . allopurinol  150 mg Oral Daily  . antiseptic oral rinse  15 mL Mouth Rinse BID  . doxycycline  100 mg Oral Q12H  . insulin aspart  0-9 Units Subcutaneous TID WC  . insulin aspart protamine- aspart  10 Units Subcutaneous Q breakfast  . insulin aspart protamine- aspart  5 Units Subcutaneous Q supper  . pantoprazole  40 mg Oral Daily  . phenytoin  300 mg Oral QHS  . simvastatin  10 mg Oral QHS  . sodium chloride  3 mL Intravenous Q12H  . Warfarin - Pharmacist Dosing Inpatient   Does not apply q1800    I  have reviewed scheduled and prn medications. Camille Bal, MD [facility] [phone] pager 01/03/2013, 9:54 AM

## 2013-01-03 NOTE — Progress Notes (Signed)
[facility]  DAILY PROGRESS NOTE   Subjective:  States she is well, but clearly still orthopneic. No pain. Meager urine output. Tolerating CVVH.  Objective:  Temp:  [97.3 F (36.3 C)-98.7 F (37.1 C)] 98.3 F (36.8 C) (06/21 0800) Pulse Rate:  [52-87] 77 (06/21 0700) Resp:  [16-28] 20 (06/21 0700) BP: (91-144)/(31-81) 91/64 mmHg (06/21 0700) SpO2:  [85 %-99 %] 91 % (06/21 0800) FiO2 (%):  [50 %-100 %] 100 % (06/21 0800) Weight:  [115.1 kg (253 lb 12 oz)] 115.1 kg (253 lb 12 oz) (06/21 0459) Weight change: -2 kg (-4 lb 6.6 oz)  Intake/Output from previous day: 06/20 0701 - 06/21 0700 In: 426 [P.O.:420; I.V.:6] Out: 2532 [Urine:232]  Intake/Output from this shift: Total I/O In: -  Out: 32 [Other:84]  Medications: Current Facility-Administered Medications  Medication Dose Route Frequency Provider Last Rate Last Dose  . 0.9 %  sodium chloride infusion  250 mL Intravenous PRN Brittainy Simmons, PA-C 10 mL/hr at 12/31/12 2100 250 mL at 12/31/12 2100  . acetaminophen (TYLENOL) tablet 650 mg  650 mg Oral Q4H PRN Brittainy Simmons, PA-C      . allopurinol (ZYLOPRIM) tablet 150 mg  150 mg Oral Daily Brittainy Simmons, PA-C   150 mg at 01/02/13 0903  . ALPRAZolam Prudy Feeler) tablet 0.25 mg  0.25 mg Oral BID PRN Brittainy Simmons, PA-C      . antiseptic oral rinse (BIOTENE) solution 15 mL  15 mL Mouth Rinse BID Chrystie Nose, MD   15 mL at 01/02/13 2000  . doxycycline (VIBRA-TABS) tablet 100 mg  100 mg Oral Q12H Sadie Haber, MD   100 mg at 01/02/13 2114  . heparin 10,000 units/ 20 mL infusion syringe  250-3,000 Units/hr CRRT Continuous Sadie Haber, MD 3.7 mL/hr at 01/03/13 0700 1,850 Units/hr at 01/03/13 0700  . heparin bolus via infusion syringe 1,000 Units  1,000 Units CRRT PRN Sadie Haber, MD      . heparin injection 1,000-6,000 Units  1,000-6,000 Units CRRT PRN Sadie Haber, MD      . heparinized saline (2000 units/L) primer fluid for  CRRT   CRRT PRN Sadie Haber, MD      . insulin aspart (novoLOG) injection 0-9 Units  0-9 Units Subcutaneous TID WC Chrystie Nose, MD   1 Units at 01/02/13 1715  . insulin aspart protamine- aspart (NOVOLOG 70/30) injection 10 Units  10 Units Subcutaneous Q breakfast Chrystie Nose, MD   10 Units at 01/02/13 0802  . insulin aspart protamine- aspart (NOVOLOG 70/30) injection 5 Units  5 Units Subcutaneous Q supper Chrystie Nose, MD   5 Units at 01/02/13 1714  . ondansetron (ZOFRAN) injection 4 mg  4 mg Intravenous Q6H PRN Brittainy Simmons, PA-C      . pantoprazole (PROTONIX) EC tablet 40 mg  40 mg Oral Daily Brittainy Simmons, PA-C   40 mg at 01/02/13 0903  . phenylephrine (NEO-SYNEPHRINE) 40,000 mcg in dextrose 5 % 250 mL infusion  30-200 mcg/min Intravenous Titrated Chrystie Nose, MD   60 mcg/min at 12/31/12 1825  . phenytoin (DILANTIN) ER capsule 300 mg  300 mg Oral QHS Brittainy Simmons, PA-C   300 mg at 01/02/13 2114  . prismasol BGK 4/2.5 5,000 mL dialysis replacement fluid   CRRT Continuous Sadie Haber, MD 300 mL/hr at 01/02/13 2243    . prismasol BGK 4/2.5 5,000 mL dialysis replacement fluid   CRRT Continuous Duke Salvia  Eliott Nine, MD 200 mL/hr at 01/02/13 2251    . prismasol BGK 4/2.5 5,000 mL dialysis solution   CRRT Continuous Sadie Haber, MD 1,000 mL/hr at 01/02/13 2229    . senna-docusate (Senokot-S) tablet 1 tablet  1 tablet Oral QHS PRN Brittainy Simmons, PA-C      . simvastatin (ZOCOR) tablet 10 mg  10 mg Oral QHS Brittainy Simmons, PA-C   10 mg at 01/02/13 2114  . sodium chloride 0.9 % injection 3 mL  3 mL Intravenous Q12H Brittainy Simmons, PA-C   3 mL at 01/02/13 2115  . sodium chloride 0.9 % injection 3 mL  3 mL Intravenous PRN Brittainy Simmons, PA-C      . Warfarin - Pharmacist Dosing Inpatient   Does not apply q1800 Drake Leach Rumbarger, RPH      . zolpidem (AMBIEN) tablet 5 mg  5 mg Oral QHS PRN Robbie Lis, PA-C        Physical Exam: General  appearance: alert, cooperative and no distress Neck: no adenopathy, no carotid bruit, no JVD, supple, symmetrical, trachea midline and thyroid not enlarged, symmetric, no tenderness/mass/nodules Lungs: diminished breath sounds bibasilar Heart: regular rate and rhythm, S1, S2 normal and distant systolic murmur mid precordium Abdomen: soft, non-tender; bowel sounds normal; no masses,  no organomegaly Extremities: edema 3+ Pulses: 2+ and symmetric in the upper extremities, Doppler only lower extremities Skin: Skin color, texture, turgor normal. No rashes or lesions Neurologic: Grossly normal  Lab Results: Results for orders placed during the hospital encounter of 12/30/12 (from the past 48 hour(s))  PHENYTOIN LEVEL, TOTAL     Status: Abnormal   Collection Time    01/01/13  9:00 AM      Result Value Range   Phenytoin Lvl 3.8 (*) 10.0 - 20.0 ug/mL  GLUCOSE, CAPILLARY     Status: Abnormal   Collection Time    01/01/13  9:18 AM      Result Value Range   Glucose-Capillary 104 (*) 70 - 99 mg/dL  POCT ACTIVATED CLOTTING TIME     Status: None   Collection Time    01/01/13 11:51 AM      Result Value Range   Activated Clotting Time 198    GLUCOSE, CAPILLARY     Status: Abnormal   Collection Time    01/01/13 12:19 PM      Result Value Range   Glucose-Capillary 56 (*) 70 - 99 mg/dL  RENAL FUNCTION PANEL     Status: Abnormal   Collection Time    01/01/13  3:00 PM      Result Value Range   Sodium 132 (*) 135 - 145 mEq/L   Potassium 4.8  3.5 - 5.1 mEq/L   Chloride 96  96 - 112 mEq/L   CO2 26  19 - 32 mEq/L   Glucose, Bld 147 (*) 70 - 99 mg/dL   BUN 39 (*) 6 - 23 mg/dL   Creatinine, Ser 1.61 (*) 0.50 - 1.10 mg/dL   Calcium 8.4  8.4 - 09.6 mg/dL   Phosphorus 3.1  2.3 - 4.6 mg/dL   Albumin 2.6 (*) 3.5 - 5.2 g/dL   GFR calc non Af Amer 18 (*) >90 mL/min   GFR calc Af Amer 20 (*) >90 mL/min   Comment:            The eGFR has been calculated     using the CKD EPI equation.     This  calculation has not been  validated in all clinical     situations.     eGFR's persistently     <90 mL/min signify     possible Chronic Kidney Disease.  POCT ACTIVATED CLOTTING TIME     Status: None   Collection Time    01/01/13  3:52 PM      Result Value Range   Activated Clotting Time 186    GLUCOSE, CAPILLARY     Status: Abnormal   Collection Time    01/01/13  4:18 PM      Result Value Range   Glucose-Capillary 202 (*) 70 - 99 mg/dL  POCT ACTIVATED CLOTTING TIME     Status: None   Collection Time    01/01/13  5:00 PM      Result Value Range   Activated Clotting Time 191    POCT ACTIVATED CLOTTING TIME     Status: None   Collection Time    01/01/13  5:55 PM      Result Value Range   Activated Clotting Time 186    POCT ACTIVATED CLOTTING TIME     Status: None   Collection Time    01/01/13  6:48 PM      Result Value Range   Activated Clotting Time 186    POCT ACTIVATED CLOTTING TIME     Status: None   Collection Time    01/01/13  8:05 PM      Result Value Range   Activated Clotting Time 191    POCT ACTIVATED CLOTTING TIME     Status: None   Collection Time    01/01/13  9:02 PM      Result Value Range   Activated Clotting Time 191    POCT ACTIVATED CLOTTING TIME     Status: None   Collection Time    01/01/13  9:57 PM      Result Value Range   Activated Clotting Time 186    GLUCOSE, CAPILLARY     Status: None   Collection Time    01/01/13 10:15 PM      Result Value Range   Glucose-Capillary 70  70 - 99 mg/dL  POCT ACTIVATED CLOTTING TIME     Status: None   Collection Time    01/01/13 11:01 PM      Result Value Range   Activated Clotting Time 186    POCT ACTIVATED CLOTTING TIME     Status: None   Collection Time    01/01/13 11:59 PM      Result Value Range   Activated Clotting Time 196    POCT ACTIVATED CLOTTING TIME     Status: None   Collection Time    01/02/13  1:01 AM      Result Value Range   Activated Clotting Time 201    POCT ACTIVATED CLOTTING  TIME     Status: None   Collection Time    01/02/13  2:01 AM      Result Value Range   Activated Clotting Time 201    GLUCOSE, CAPILLARY     Status: Abnormal   Collection Time    01/02/13  3:28 AM      Result Value Range   Glucose-Capillary 104 (*) 70 - 99 mg/dL  PROTIME-INR     Status: Abnormal   Collection Time    01/02/13  3:45 AM      Result Value Range   Prothrombin Time 41.0 (*) 11.6 - 15.2 seconds   INR 4.67 (*) 0.00 -  1.49  MAGNESIUM     Status: None   Collection Time    01/02/13  3:45 AM      Result Value Range   Magnesium 2.3  1.5 - 2.5 mg/dL  RENAL FUNCTION PANEL     Status: Abnormal   Collection Time    01/02/13  3:45 AM      Result Value Range   Sodium 135  135 - 145 mEq/L   Potassium 5.2 (*) 3.5 - 5.1 mEq/L   Chloride 100  96 - 112 mEq/L   CO2 27  19 - 32 mEq/L   Glucose, Bld 108 (*) 70 - 99 mg/dL   BUN 33 (*) 6 - 23 mg/dL   Creatinine, Ser 3.08 (*) 0.50 - 1.10 mg/dL   Calcium 8.8  8.4 - 65.7 mg/dL   Phosphorus 2.8  2.3 - 4.6 mg/dL   Albumin 2.6 (*) 3.5 - 5.2 g/dL   GFR calc non Af Amer 21 (*) >90 mL/min   GFR calc Af Amer 25 (*) >90 mL/min   Comment:            The eGFR has been calculated     using the CKD EPI equation.     This calculation has not been     validated in all clinical     situations.     eGFR's persistently     <90 mL/min signify     possible Chronic Kidney Disease.  POCT ACTIVATED CLOTTING TIME     Status: None   Collection Time    01/02/13  6:00 AM      Result Value Range   Activated Clotting Time 196    GLUCOSE, CAPILLARY     Status: None   Collection Time    01/02/13  7:25 AM      Result Value Range   Glucose-Capillary 98  70 - 99 mg/dL  POCT ACTIVATED CLOTTING TIME     Status: None   Collection Time    01/02/13 10:06 AM      Result Value Range   Activated Clotting Time 196    GLUCOSE, CAPILLARY     Status: Abnormal   Collection Time    01/02/13 11:38 AM      Result Value Range   Glucose-Capillary 126 (*) 70 - 99  mg/dL  POCT ACTIVATED CLOTTING TIME     Status: None   Collection Time    01/02/13  2:26 PM      Result Value Range   Activated Clotting Time 196    RENAL FUNCTION PANEL     Status: Abnormal   Collection Time    01/02/13  4:00 PM      Result Value Range   Sodium 136  135 - 145 mEq/L   Potassium 5.1  3.5 - 5.1 mEq/L   Chloride 101  96 - 112 mEq/L   CO2 27  19 - 32 mEq/L   Glucose, Bld 128 (*) 70 - 99 mg/dL   BUN 28 (*) 6 - 23 mg/dL   Creatinine, Ser 8.46 (*) 0.50 - 1.10 mg/dL   Calcium 8.7  8.4 - 96.2 mg/dL   Phosphorus 2.4  2.3 - 4.6 mg/dL   Albumin 2.5 (*) 3.5 - 5.2 g/dL   GFR calc non Af Amer 24 (*) >90 mL/min   GFR calc Af Amer 27 (*) >90 mL/min   Comment:            The eGFR has been calculated  using the CKD EPI equation.     This calculation has not been     validated in all clinical     situations.     eGFR's persistently     <90 mL/min signify     possible Chronic Kidney Disease.  GLUCOSE, CAPILLARY     Status: Abnormal   Collection Time    01/02/13  4:09 PM      Result Value Range   Glucose-Capillary 142 (*) 70 - 99 mg/dL  POCT ACTIVATED CLOTTING TIME     Status: None   Collection Time    01/02/13  6:05 PM      Result Value Range   Activated Clotting Time 191    GLUCOSE, CAPILLARY     Status: None   Collection Time    01/02/13  9:27 PM      Result Value Range   Glucose-Capillary 97  70 - 99 mg/dL  POCT ACTIVATED CLOTTING TIME     Status: None   Collection Time    01/02/13  9:52 PM      Result Value Range   Activated Clotting Time 181    POCT ACTIVATED CLOTTING TIME     Status: None   Collection Time    01/02/13 10:54 PM      Result Value Range   Activated Clotting Time 181    POCT ACTIVATED CLOTTING TIME     Status: None   Collection Time    01/02/13 11:49 PM      Result Value Range   Activated Clotting Time 186    POCT ACTIVATED CLOTTING TIME     Status: None   Collection Time    01/03/13 12:53 AM      Result Value Range   Activated  Clotting Time 191    POCT ACTIVATED CLOTTING TIME     Status: None   Collection Time    01/03/13  1:57 AM      Result Value Range   Activated Clotting Time 206    POCT ACTIVATED CLOTTING TIME     Status: None   Collection Time    01/03/13  2:55 AM      Result Value Range   Activated Clotting Time 206    POCT ACTIVATED CLOTTING TIME     Status: None   Collection Time    01/03/13  3:47 AM      Result Value Range   Activated Clotting Time 201    PROTIME-INR     Status: Abnormal   Collection Time    01/03/13  4:00 AM      Result Value Range   Prothrombin Time 38.5 (*) 11.6 - 15.2 seconds   INR 4.29 (*) 0.00 - 1.49  MAGNESIUM     Status: None   Collection Time    01/03/13  4:00 AM      Result Value Range   Magnesium 2.4  1.5 - 2.5 mg/dL  APTT     Status: Abnormal   Collection Time    01/03/13  4:00 AM      Result Value Range   aPTT 65 (*) 24 - 37 seconds   Comment:            IF BASELINE aPTT IS ELEVATED,     SUGGEST PATIENT RISK ASSESSMENT     BE USED TO DETERMINE APPROPRIATE     ANTICOAGULANT THERAPY.  RENAL FUNCTION PANEL     Status: Abnormal   Collection Time  01/03/13  4:00 AM      Result Value Range   Sodium 136  135 - 145 mEq/L   Potassium 5.0  3.5 - 5.1 mEq/L   Chloride 101  96 - 112 mEq/L   CO2 28  19 - 32 mEq/L   Glucose, Bld 91  70 - 99 mg/dL   BUN 24 (*) 6 - 23 mg/dL   Creatinine, Ser 1.61 (*) 0.50 - 1.10 mg/dL   Calcium 9.1  8.4 - 09.6 mg/dL   Phosphorus 2.6  2.3 - 4.6 mg/dL   Albumin 2.6 (*) 3.5 - 5.2 g/dL   GFR calc non Af Amer 27 (*) >90 mL/min   GFR calc Af Amer 31 (*) >90 mL/min   Comment:            The eGFR has been calculated     using the CKD EPI equation.     This calculation has not been     validated in all clinical     situations.     eGFR's persistently     <90 mL/min signify     possible Chronic Kidney Disease.  POCT ACTIVATED CLOTTING TIME     Status: None   Collection Time    01/03/13  8:17 AM      Result Value Range    Activated Clotting Time 211    GLUCOSE, CAPILLARY     Status: None   Collection Time    01/03/13  8:25 AM      Result Value Range   Glucose-Capillary 79  70 - 99 mg/dL    Imaging: No results found.  Assessment:  1. Principal Problem: 2.   Acute on chronic diastolic CHF (congestive heart failure), NYHA class 3, 09/25/12 3. Active Problems: 4.   Sleep apnea on c-pap  5.   Aortic insufficiency: mild 6.   Chronic venous insufficiency with LE ulcers, will be followed at wound center 7.   Diabetes mellitus type 2 in obese 8.   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   CKD (chronic kidney disease) stage 3, GFR 30-59 ml/min 10.   History of Rt brain CVA 11.   Venous stasis of lower extremity 12.   Cardiorenal syndrome with renal failure 13.   Cardiogenic shock 14.   Plan:  1. Severe biventricular failure with restrictive physiology and massive hypervolemia. On my review, the increased gradient is mostly mid-cavitary, rather than classic LVOT obstruction of HOCM. This makes it less likely she will have a good hemodynamic response to beta blockers. At this point, it is definitely to early to attempt beta blockers. 2. Doing a good job of fluid removal with CVVH without hypotension in last 24 hours. Severely oliguric. If BP drops again, would restart phenylephrine rather than stop fluid removal. Unfortunately, long term outcome appears unfavorable, as outlined in Dr. Elza Rafter note.  Time Spent Directly with Patient:  40 minutes  Length of Stay:  LOS: 4 days    Ketura Sirek 01/03/2013, 8:47 AM

## 2013-01-03 NOTE — Progress Notes (Signed)
ANTICOAGULATION CONSULT NOTE - Follow-up Consult  Pharmacy Consult for warfarin Indication: hx DVT, afib  No Known Allergies  Patient Measurements: Height: 5\' 5"  (165.1 cm) Weight: 253 lb 12 oz (115.1 kg) IBW/kg (Calculated) : 57  Vital Signs: Temp: 98.7 F (37.1 C) (06/21 0400) Temp src: Axillary (06/21 0400) BP: 91/64 mmHg (06/21 0700) Pulse Rate: 77 (06/21 0700)  Labs:  Recent Labs  12/31/12 0930  01/01/13 0345  01/02/13 0345 01/02/13 1600 01/03/13 0400  HGB  --   --  12.8  --   --   --   --   HCT  --   --  39.5  --   --   --   --   PLT  --   --  217  --   --   --   --   APTT  --   --  50*  --   --   --  65*  LABPROT  --   --  41.4*  --  41.0*  --  38.5*  INR  --   --  4.73*  --  4.67*  --  4.29*  CREATININE  --   < > 2.61*  < > 2.11* 1.95* 1.75*  TROPONINI <0.30  --   --   --   --   --   --   < > = values in this interval not displayed.  Estimated Creatinine Clearance: 34.1 ml/min (by C-G formula based on Cr of 1.75).   Medical History: Past Medical History  Diagnosis Date  . Hypertension   . Venous stasis of lower extremity WEARS TEDS    BLE--  CURRENTLY NO ULCERS/ WOUNDS  . Aortic insufficiency     mild/H&P 04/02/2012  . Type II diabetes mellitus   . OSA on CPAP   . Pulmonary hypertension per last echo 03-28-2012  mod.-severe     per dr wert note this related to L ht pressures from diastolic chf  . Hypertrophic obstructive cardiomyopathy(425.11)   . Arthritis   . CKD (chronic kidney disease) stage 3, GFR 30-59 ml/min   . Heart murmur   . History of DVT of lower extremity lower right leg nov 2012  . Hyperlipidemia   . History of gout 06-09-2012  per pt stable  . Chronic diastolic CHF (congestive heart failure), NYHA class 3 CARDIOLOGIST- DR HILTY--  LOV OCT 2013--  REQUESTED NOTE, EGK AND STRESS TEST    PER PT ON 06-09-2012  S&S OF CHF AND WT IS DOWN  . H/O hiatal hernia   . GERD (gastroesophageal reflux disease)   . Lower extremity edema   . PAF  (paroxysmal atrial fibrillation), in SR on admit 09/25/2012  . Stroke Jan 2014    Rt brain, Nl CA doppler  . TIA (transient ischemic attack) 08/07/2012    2D Echo - EF 60-65, severe concentric hypertrophy in the left ventricle  . Dyspnea on exertion 11/30/2010    Gated Stress Dipyridamole Myocardial Perfusion - post stress EF-63, normal study, no ischemia demonstrated     Medications:  Prescriptions prior to admission  Medication Sig Dispense Refill  . allopurinol (ZYLOPRIM) 300 MG tablet Take 150 mg by mouth daily.      . cloNIDine (CATAPRES) 0.2 MG tablet Take 0.2 mg by mouth 2 (two) times daily.       . furosemide (LASIX) 40 MG tablet Take 40 mg by mouth 2 (two) times daily.      Marland Kitchen glimepiride (AMARYL) 4  MG tablet Take 4 mg by mouth daily before breakfast.       . insulin lispro protamine-insulin lispro (HUMALOG 75/25) (75-25) 100 UNIT/ML SUSP Inject 5-10 Units into the skin 2 (two) times daily with a meal. 10 units with breakfast and 5 units at dinner      . isosorbide-hydrALAZINE (BIDIL) 20-37.5 MG per tablet Take 1 tablet by mouth 3 (three) times daily.      . metolazone (ZAROXOLYN) 2.5 MG tablet 2.5 mg. Take one tab 3 times a week as needed based on weight- if weight is up 5lbs, take one tablet      . metoprolol succinate (TOPROL-XL) 25 MG 24 hr tablet Take 37.5 mg by mouth 2 (two) times daily.      Marland Kitchen omeprazole (PRILOSEC) 20 MG capsule Take 20 mg by mouth every morning.       . phenytoin (DILANTIN) 300 MG ER capsule Take 1 capsule (300 mg total) by mouth at bedtime.  30 capsule  2  . potassium chloride SA (K-DUR,KLOR-CON) 20 MEQ tablet Take 20 mEq by mouth 2 (two) times daily.      Marland Kitchen senna-docusate (SENOKOT-S) 8.6-50 MG per tablet Take 1 tablet by mouth at bedtime as needed for constipation.      . simvastatin (ZOCOR) 10 MG tablet Take 10 mg by mouth at bedtime.      . sulfamethoxazole-trimethoprim (BACTRIM DS) 800-160 MG per tablet Take 1 tablet by mouth 2 (two) times daily.      Marland Kitchen  warfarin (COUMADIN) 2.5 MG tablet Take 1.25-2.5 mg by mouth every evening. 2.5mg /day except take 1.25mg  on MF        Assessment: 23 yof  admitted 12/30/2012  With SOB and LE edema.  Patient with hx DVT and afib pharmacy consulted to continue on warfarin.   INR supratherapeutic today (4.29) despite only receiving 1 dose of Coumadin at her home dose on 6/18.  No bleeding or complications noted per chart notes.  Goal of Therapy:  INR 2-3   Plan:  1. No Warfarin today. 2. Daily INR  Thank you for allowing pharmacy to be a part of this patients care team.  Tad Moore, BCPS  Clinical Pharmacist Pager [phone]  01/03/2013 7:54 AM

## 2013-01-03 NOTE — Plan of Care (Signed)
Problem: Phase I Progression Outcomes Goal: Other Phase I Outcomes/Goals Outcome: Not Progressing oliguric

## 2013-01-04 DIAGNOSIS — I519 Heart disease, unspecified: Secondary | ICD-10-CM

## 2013-01-04 DIAGNOSIS — G4730 Sleep apnea, unspecified: Secondary | ICD-10-CM

## 2013-01-04 LAB — RENAL FUNCTION PANEL
Albumin: 2.4 g/dL — ABNORMAL LOW (ref 3.5–5.2)
BUN: 19 mg/dL (ref 6–23)
CO2: 28 mEq/L (ref 19–32)
Chloride: 101 mEq/L (ref 96–112)
Chloride: 102 mEq/L (ref 96–112)
Creatinine, Ser: 1.45 mg/dL — ABNORMAL HIGH (ref 0.50–1.10)
GFR calc Af Amer: 46 mL/min — ABNORMAL LOW (ref >90)
GFR calc non Af Amer: 34 mL/min — ABNORMAL LOW (ref >90)
GFR calc non Af Amer: 39 mL/min — ABNORMAL LOW (ref >90)
Potassium: 4.8 mEq/L (ref 3.5–5.1)
Potassium: 4.8 mEq/L (ref 3.5–5.1)
Sodium: 136 mEq/L (ref 135–145)

## 2013-01-04 LAB — GLUCOSE, CAPILLARY
Glucose-Capillary: 81 mg/dL (ref 70–99)
Glucose-Capillary: 137 mg/dL — ABNORMAL HIGH (ref 70–99)

## 2013-01-04 LAB — POCT ACTIVATED CLOTTING TIME
Activated Clotting Time: 217 seconds
Activated Clotting Time: 211 seconds

## 2013-01-04 LAB — PROTIME-INR
INR: 3.52 — ABNORMAL HIGH (ref 0.00–1.49)
Prothrombin Time: 33.3 seconds — ABNORMAL HIGH (ref 11.6–15.2)

## 2013-01-04 MED ORDER — CAMPHOR-MENTHOL 0.5-0.5 % EX LOTN
TOPICAL_LOTION | CUTANEOUS | Status: DC | PRN
  Filled 2013-01-04: qty 222

## 2013-01-04 NOTE — Progress Notes (Signed)
[facility] ROUNDING NOTE   ASSESSMENT/RECOMMENDATIONS:   [name] with DM2, history of CVA, Sz disorder, prior hypertension, baseline CKD3 with baseline creatinine 1.5-1.7 as recently as 09/2012 (never worked up) and diastolic/right sided CHF with HOCM who presents with anasarca and acute renal failure on chronic in the setting of decompensated CHF, hypotension, refractory to IV diuretics.  Acute kidney injury on chronic with significant cardiorenal syndrome and massive diffuse anasarca Remains oliguric (just over 300 cc last 24 hours) Tolerating CRRT -125 hour via right ij temp cath (6/18) and weight down total of 5.5 kg though still with marked edema BP more stable over 100s past 24 hours so tolerating -125/hour off without need for phenylephrine Cards suspects will reaccumulate fluid once CRRT stopped because of her severe LVH/mid cavitary obliteration. This could be a very difficult situation if she does not recover renal function, although with her improved BP (initially in the 80-90 range when we started) I am more optimistic that she could tolerated conventional HD given BP trends over the past 24 hours   Subjective:  CRRT since late 6/18 BP has increased and she is tolerating more aggressive fluid removal Off ventimask, and down to 3 literes nasal cannula Objective Vital signs in last 24 hours: Filed Vitals:   01/04/13 0600 01/04/13 0700 01/04/13 0759 01/04/13 0800  BP: 118/45 126/43 149/59   Pulse: 80 83 82 81  Temp:   98.5 F (36.9 C) 99.4 F (37.4 C)  TempSrc:   Oral Axillary  Resp: 18 21 19 19   Height:      Weight:      SpO2: 100% 100% 100% 100%   Weight change: -3 kg (-6 lb 9.8 oz)  Intake/Output Summary (Last 24 hours) at 01/04/13 0902 Last data filed at 01/04/13 0800  Gross per 24 hour  Intake    820 ml  Output   3341 ml  Net  -2521 ml  I/O last 3 completed shifts: In: 943 [P.O.:940; I.V.:3] Out: 4461 [Urine:377; Other:4084] Total I/O In: -   Out: 122 [Other:122]   Physical Exam:  BP 149/59  Pulse 81  Temp(Src) 99.4 F (37.4 C) (Axillary)  Resp 19  Ht 5\' 5"  (1.651 m)  Wt 112.1 kg (247 lb 2.2 oz)  BMI 41.13 kg/m2  SpO2 100%  On venti mask VS as noted  JVP to jaw angle but lungs clear ant today Abd wall edema somewhat less + BS not tender  2+ thigh edema  Legs wrapped - wraps not removed but areas of LE's above and below wraps with 2+ edema/woody changes  Weights: 01/04/13 0456 112.1 kg  01/03/13 0459 115.1 kg  01/02/13 0500 117.1 kg 01/01/13 0603 117.7 kg   12/31/12 0015 115.5 kg   Basic Metabolic Panel:  Recent Labs Lab 01/01/13 0345 01/01/13 1500 01/02/13 0345 01/02/13 1600 01/03/13 0400 01/03/13 1555 01/04/13 0400  NA 136 132* 135 136 136 134* 134*  K 4.4 4.8 5.2* 5.1 5.0 4.9 4.8  CL 99 96 100 101 101 100 101  CO2 25 26 27 27 28 26 28   GLUCOSE 86 147* 108* 128* 91 122* 86  BUN 46* 39* 33* 28* 24* 23 21  CREATININE 2.61* 2.49* 2.11* 1.95* 1.75* 1.75* 1.45*  CALCIUM 9.0 8.4 8.8 8.7 9.1 8.7 8.5  PHOS 4.1 3.1 2.8 2.4 2.6 2.3 2.1*   Liver Function Tests:  Recent Labs Lab 12/31/12 0440  01/01/13 0730  01/03/13 0400 01/03/13 1555 01/04/13 0400  AST 36  --  39*  --   --   --   --   ALT 26  --  22  --   --   --   --   ALKPHOS 101  --  91  --   --   --   --   BILITOT 0.4  --  0.5  --   --   --   --   PROT 6.3  --  5.9*  --   --   --   --   ALBUMIN 2.8*  < > 2.5*  < > 2.6* 2.5* 2.4*  < > = values in this interval not displayed.  Recent Labs Lab 12/30/12 1652 01/01/13 0345  WBC 8.6 10.4  NEUTROABS 6.4  --   HGB 12.9 12.8  HCT 38.8 39.5  MCV 93.3 93.8  PLT 205 217    Recent Labs Lab 12/30/12 2115 12/31/12 0440 12/31/12 0930  TROPONINI <0.30 <0.30 <0.30   CBG:  Recent Labs Lab 01/03/13 0825 01/03/13 1158 01/03/13 1751 01/03/13 2204 01/04/13 0745  GLUCAP 79 211* 100* 123* 81    Medications: . heparin 10,000 units/ 20 mL infusion syringe 1,850 Units/hr (01/04/13 0800)  .  phenylephrine (NEO-SYNEPHRINE) Adult infusion Stopped (01/01/13 0600)  . dialysis replacement fluid (prismasate) 300 mL/hr at 01/03/13 1608  . dialysis replacement fluid (prismasate) 200 mL/hr at 01/02/13 2251  . dialysate (PRISMASATE) 1,000 mL/hr at 01/04/13 0710   . allopurinol  150 mg Oral Daily  . antiseptic oral rinse  15 mL Mouth Rinse BID  . doxycycline  100 mg Oral Q12H  . insulin aspart  0-9 Units Subcutaneous TID WC  . insulin aspart protamine- aspart  10 Units Subcutaneous Q breakfast  . insulin aspart protamine- aspart  5 Units Subcutaneous Q supper  . pantoprazole  40 mg Oral Daily  . phenytoin  300 mg Oral QHS  . simvastatin  10 mg Oral QHS  . sodium chloride  3 mL Intravenous Q12H  . Warfarin - Pharmacist Dosing Inpatient   Does not apply q1800    I  have reviewed scheduled and prn medications. Camille Bal, MD [facility] [phone] pager 01/04/2013, 9:02 AM

## 2013-01-04 NOTE — Progress Notes (Signed)
Subjective:  Pt less SOB  Objective:  Temp:  [97.6 F (36.4 C)-99.4 F (37.4 C)] 98.2 F (36.8 C) (06/22 0400) Pulse Rate:  [79-93] 83 (06/22 0700) Resp:  [9-26] 21 (06/22 0700) BP: (102-153)/(43-82) 126/43 mmHg (06/22 0700) SpO2:  [72 %-100 %] 100 % (06/22 0700) FiO2 (%):  [50 %-100 %] 50 % (06/21 1231) Weight:  [247 lb 2.2 oz (112.1 kg)] 247 lb 2.2 oz (112.1 kg) (06/22 0456) Weight change: -6 lb 9.8 oz (-3 kg)  Intake/Output from previous day: 06/21 0701 - 06/22 0700 In: 940 [P.O.:940] Out: 3369 [Urine:255]  Intake/Output from this shift:    Physical Exam: General appearance: alert and no distress Neck: no adenopathy, no carotid bruit, no JVD, supple, symmetrical, trachea midline, thyroid not enlarged, symmetric, no tenderness/mass/nodules and CVVH catheter in place Lungs: clear to auscultation bilaterally Heart: Soft outflow murmur Abdomen: soft, non-tender; bowel sounds normal; no masses,  no organomegaly Extremities: Wrapped  Lab Results: Results for orders placed during the hospital encounter of 12/30/12 (from the past 48 hour(s))  POCT ACTIVATED CLOTTING TIME     Status: None   Collection Time    01/02/13 10:06 AM      Result Value Range   Activated Clotting Time 196    GLUCOSE, CAPILLARY     Status: Abnormal   Collection Time    01/02/13 11:38 AM      Result Value Range   Glucose-Capillary 126 (*) 70 - 99 mg/dL  POCT ACTIVATED CLOTTING TIME     Status: None   Collection Time    01/02/13  2:26 PM      Result Value Range   Activated Clotting Time 196    RENAL FUNCTION PANEL     Status: Abnormal   Collection Time    01/02/13  4:00 PM      Result Value Range   Sodium 136  135 - 145 mEq/L   Potassium 5.1  3.5 - 5.1 mEq/L   Chloride 101  96 - 112 mEq/L   CO2 27  19 - 32 mEq/L   Glucose, Bld 128 (*) 70 - 99 mg/dL   BUN 28 (*) 6 - 23 mg/dL   Creatinine, Ser 4.54 (*) 0.50 - 1.10 mg/dL   Calcium 8.7  8.4 - 09.8 mg/dL   Phosphorus 2.4  2.3 - 4.6 mg/dL   Albumin 2.5 (*) 3.5 - 5.2 g/dL   GFR calc non Af Amer 24 (*) >90 mL/min   GFR calc Af Amer 27 (*) >90 mL/min   Comment:            The eGFR has been calculated     using the CKD EPI equation.     This calculation has not been     validated in all clinical     situations.     eGFR's persistently     <90 mL/min signify     possible Chronic Kidney Disease.  GLUCOSE, CAPILLARY     Status: Abnormal   Collection Time    01/02/13  4:09 PM      Result Value Range   Glucose-Capillary 142 (*) 70 - 99 mg/dL  POCT ACTIVATED CLOTTING TIME     Status: None   Collection Time    01/02/13  6:05 PM      Result Value Range   Activated Clotting Time 191    GLUCOSE, CAPILLARY     Status: None   Collection Time    01/02/13  9:27 PM  Result Value Range   Glucose-Capillary 97  70 - 99 mg/dL  POCT ACTIVATED CLOTTING TIME     Status: None   Collection Time    01/02/13  9:52 PM      Result Value Range   Activated Clotting Time 181    POCT ACTIVATED CLOTTING TIME     Status: None   Collection Time    01/02/13 10:54 PM      Result Value Range   Activated Clotting Time 181    POCT ACTIVATED CLOTTING TIME     Status: None   Collection Time    01/02/13 11:49 PM      Result Value Range   Activated Clotting Time 186    POCT ACTIVATED CLOTTING TIME     Status: None   Collection Time    01/03/13 12:53 AM      Result Value Range   Activated Clotting Time 191    POCT ACTIVATED CLOTTING TIME     Status: None   Collection Time    01/03/13  1:57 AM      Result Value Range   Activated Clotting Time 206    POCT ACTIVATED CLOTTING TIME     Status: None   Collection Time    01/03/13  2:55 AM      Result Value Range   Activated Clotting Time 206    POCT ACTIVATED CLOTTING TIME     Status: None   Collection Time    01/03/13  3:47 AM      Result Value Range   Activated Clotting Time 201    PROTIME-INR     Status: Abnormal   Collection Time    01/03/13  4:00 AM      Result Value Range    Prothrombin Time 38.5 (*) 11.6 - 15.2 seconds   INR 4.29 (*) 0.00 - 1.49  MAGNESIUM     Status: None   Collection Time    01/03/13  4:00 AM      Result Value Range   Magnesium 2.4  1.5 - 2.5 mg/dL  APTT     Status: Abnormal   Collection Time    01/03/13  4:00 AM      Result Value Range   aPTT 65 (*) 24 - 37 seconds   Comment:            IF BASELINE aPTT IS ELEVATED,     SUGGEST PATIENT RISK ASSESSMENT     BE USED TO DETERMINE APPROPRIATE     ANTICOAGULANT THERAPY.  RENAL FUNCTION PANEL     Status: Abnormal   Collection Time    01/03/13  4:00 AM      Result Value Range   Sodium 136  135 - 145 mEq/L   Potassium 5.0  3.5 - 5.1 mEq/L   Chloride 101  96 - 112 mEq/L   CO2 28  19 - 32 mEq/L   Glucose, Bld 91  70 - 99 mg/dL   BUN 24 (*) 6 - 23 mg/dL   Creatinine, Ser 4.09 (*) 0.50 - 1.10 mg/dL   Calcium 9.1  8.4 - 81.1 mg/dL   Phosphorus 2.6  2.3 - 4.6 mg/dL   Albumin 2.6 (*) 3.5 - 5.2 g/dL   GFR calc non Af Amer 27 (*) >90 mL/min   GFR calc Af Amer 31 (*) >90 mL/min   Comment:            The eGFR has been calculated  using the CKD EPI equation.     This calculation has not been     validated in all clinical     situations.     eGFR's persistently     <90 mL/min signify     possible Chronic Kidney Disease.  POCT ACTIVATED CLOTTING TIME     Status: None   Collection Time    01/03/13  8:17 AM      Result Value Range   Activated Clotting Time 211    GLUCOSE, CAPILLARY     Status: None   Collection Time    01/03/13  8:25 AM      Result Value Range   Glucose-Capillary 79  70 - 99 mg/dL  POCT ACTIVATED CLOTTING TIME     Status: None   Collection Time    01/03/13 11:57 AM      Result Value Range   Activated Clotting Time 206    GLUCOSE, CAPILLARY     Status: Abnormal   Collection Time    01/03/13 11:58 AM      Result Value Range   Glucose-Capillary 211 (*) 70 - 99 mg/dL  POCT ACTIVATED CLOTTING TIME     Status: None   Collection Time    01/03/13  3:51 PM       Result Value Range   Activated Clotting Time 217    RENAL FUNCTION PANEL     Status: Abnormal   Collection Time    01/03/13  3:55 PM      Result Value Range   Sodium 134 (*) 135 - 145 mEq/L   Potassium 4.9  3.5 - 5.1 mEq/L   Chloride 100  96 - 112 mEq/L   CO2 26  19 - 32 mEq/L   Glucose, Bld 122 (*) 70 - 99 mg/dL   BUN 23  6 - 23 mg/dL   Creatinine, Ser 1.61 (*) 0.50 - 1.10 mg/dL   Calcium 8.7  8.4 - 09.6 mg/dL   Phosphorus 2.3  2.3 - 4.6 mg/dL   Albumin 2.5 (*) 3.5 - 5.2 g/dL   GFR calc non Af Amer 27 (*) >90 mL/min   GFR calc Af Amer 31 (*) >90 mL/min   Comment:            The eGFR has been calculated     using the CKD EPI equation.     This calculation has not been     validated in all clinical     situations.     eGFR's persistently     <90 mL/min signify     possible Chronic Kidney Disease.  GLUCOSE, CAPILLARY     Status: Abnormal   Collection Time    01/03/13  5:51 PM      Result Value Range   Glucose-Capillary 100 (*) 70 - 99 mg/dL  POCT ACTIVATED CLOTTING TIME     Status: None   Collection Time    01/03/13  7:55 PM      Result Value Range   Activated Clotting Time 206    GLUCOSE, CAPILLARY     Status: Abnormal   Collection Time    01/03/13 10:04 PM      Result Value Range   Glucose-Capillary 123 (*) 70 - 99 mg/dL  POCT ACTIVATED CLOTTING TIME     Status: None   Collection Time    01/03/13 11:57 PM      Result Value Range   Activated Clotting Time 212    POCT ACTIVATED CLOTTING  TIME     Status: None   Collection Time    01/04/13  3:54 AM      Result Value Range   Activated Clotting Time 206    PROTIME-INR     Status: Abnormal   Collection Time    01/04/13  4:00 AM      Result Value Range   Prothrombin Time 33.3 (*) 11.6 - 15.2 seconds   INR 3.52 (*) 0.00 - 1.49  MAGNESIUM     Status: None   Collection Time    01/04/13  4:00 AM      Result Value Range   Magnesium 2.3  1.5 - 2.5 mg/dL  APTT     Status: Abnormal   Collection Time    01/04/13  4:00  AM      Result Value Range   aPTT 80 (*) 24 - 37 seconds   Comment:            IF BASELINE aPTT IS ELEVATED,     SUGGEST PATIENT RISK ASSESSMENT     BE USED TO DETERMINE APPROPRIATE     ANTICOAGULANT THERAPY.  RENAL FUNCTION PANEL     Status: Abnormal   Collection Time    01/04/13  4:00 AM      Result Value Range   Sodium 134 (*) 135 - 145 mEq/L   Potassium 4.8  3.5 - 5.1 mEq/L   Chloride 101  96 - 112 mEq/L   CO2 28  19 - 32 mEq/L   Glucose, Bld 86  70 - 99 mg/dL   BUN 21  6 - 23 mg/dL   Creatinine, Ser 0.98 (*) 0.50 - 1.10 mg/dL   Calcium 8.5  8.4 - 11.9 mg/dL   Phosphorus 2.1 (*) 2.3 - 4.6 mg/dL   Albumin 2.4 (*) 3.5 - 5.2 g/dL   GFR calc non Af Amer 34 (*) >90 mL/min   GFR calc Af Amer 39 (*) >90 mL/min   Comment:            The eGFR has been calculated     using the CKD EPI equation.     This calculation has not been     validated in all clinical     situations.     eGFR's persistently     <90 mL/min signify     possible Chronic Kidney Disease.  GLUCOSE, CAPILLARY     Status: None   Collection Time    01/04/13  7:45 AM      Result Value Range   Glucose-Capillary 81  70 - 99 mg/dL    Imaging: Imaging results have been reviewed  Assessment/Plan:   1. Principal Problem: 2.   Acute on chronic diastolic CHF (congestive heart failure), NYHA class 3, 09/25/12 3. Active Problems: 4.   Sleep apnea on c-pap  5.   Aortic insufficiency: mild 6.   Chronic venous insufficiency with LE ulcers, will be followed at wound center 7.   Diabetes mellitus type 2 in obese 8.   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   CKD (chronic kidney disease) stage 3, GFR 30-59 ml/min 10.   History of Rt brain CVA 11.   Venous stasis of lower extremity 12.   Cardiorenal syndrome with renal failure 13.   Cardiogenic shock 14.   Time Spent Directly with Patient:  20 minutes  Length of Stay:  LOS: 5 days   Still getting CVVH. 5.9 liters neg. Pt clinically improved. Less  SOB. Scr better. OUP still  diminished. VSS. She has severe LVH with mid cavitary obliteration and a 70mm HG gradient. There is no good Rx for this. Suspect she will re accumulate fluid once CVVH D/Cd. Exam w/o change. Lungs clear. Soft out flow tract murmur.   Runell Gess 01/04/2013, 8:08 AM

## 2013-01-04 NOTE — Progress Notes (Signed)
ANTICOAGULATION CONSULT NOTE - Follow-up Consult  Pharmacy Consult for warfarin Indication: hx DVT, afib  No Known Allergies  Patient Measurements: Height: 5\' 5"  (165.1 cm) Weight: 247 lb 2.2 oz (112.1 kg) IBW/kg (Calculated) : 57  Vital Signs: Temp: 98.2 F (36.8 C) (06/22 0400) Temp src: Oral (06/22 0400) BP: 126/43 mmHg (06/22 0700) Pulse Rate: 83 (06/22 0700)  Labs:  Recent Labs  01/02/13 0345  01/03/13 0400 01/03/13 1555 01/04/13 0400  APTT  --   --  65*  --  80*  LABPROT 41.0*  --  38.5*  --  33.3*  INR 4.67*  --  4.29*  --  3.52*  CREATININE 2.11*  < > 1.75* 1.75* 1.45*  < > = values in this interval not displayed.  Estimated Creatinine Clearance: 40.5 ml/min (by C-G formula based on Cr of 1.45).   Medical History: Past Medical History  Diagnosis Date  . Hypertension   . Venous stasis of lower extremity WEARS TEDS    BLE--  CURRENTLY NO ULCERS/ WOUNDS  . Aortic insufficiency     mild/H&P 04/02/2012  . Type II diabetes mellitus   . OSA on CPAP   . Pulmonary hypertension per last echo 03-28-2012  mod.-severe     per dr wert note this related to L ht pressures from diastolic chf  . Hypertrophic obstructive cardiomyopathy(425.11)   . Arthritis   . CKD (chronic kidney disease) stage 3, GFR 30-59 ml/min   . Heart murmur   . History of DVT of lower extremity lower right leg nov 2012  . Hyperlipidemia   . History of gout 06-09-2012  per pt stable  . Chronic diastolic CHF (congestive heart failure), NYHA class 3 CARDIOLOGIST- DR HILTY--  LOV OCT 2013--  REQUESTED NOTE, EGK AND STRESS TEST    PER PT ON 06-09-2012  S&S OF CHF AND WT IS DOWN  . H/O hiatal hernia   . GERD (gastroesophageal reflux disease)   . Lower extremity edema   . PAF (paroxysmal atrial fibrillation), in SR on admit 09/25/2012  . Stroke Jan 2014    Rt brain, Nl CA doppler  . TIA (transient ischemic attack) 08/07/2012    2D Echo - EF 60-65, severe concentric hypertrophy in the left ventricle   . Dyspnea on exertion 11/30/2010    Gated Stress Dipyridamole Myocardial Perfusion - post stress EF-63, normal study, no ischemia demonstrated     Medications:  Prescriptions prior to admission  Medication Sig Dispense Refill  . allopurinol (ZYLOPRIM) 300 MG tablet Take 150 mg by mouth daily.      . cloNIDine (CATAPRES) 0.2 MG tablet Take 0.2 mg by mouth 2 (two) times daily.       . furosemide (LASIX) 40 MG tablet Take 40 mg by mouth 2 (two) times daily.      Marland Kitchen glimepiride (AMARYL) 4 MG tablet Take 4 mg by mouth daily before breakfast.       . insulin lispro protamine-insulin lispro (HUMALOG 75/25) (75-25) 100 UNIT/ML SUSP Inject 5-10 Units into the skin 2 (two) times daily with a meal. 10 units with breakfast and 5 units at dinner      . isosorbide-hydrALAZINE (BIDIL) 20-37.5 MG per tablet Take 1 tablet by mouth 3 (three) times daily.      . metolazone (ZAROXOLYN) 2.5 MG tablet 2.5 mg. Take one tab 3 times a week as needed based on weight- if weight is up 5lbs, take one tablet      . metoprolol succinate (  TOPROL-XL) 25 MG 24 hr tablet Take 37.5 mg by mouth 2 (two) times daily.      Marland Kitchen omeprazole (PRILOSEC) 20 MG capsule Take 20 mg by mouth every morning.       . phenytoin (DILANTIN) 300 MG ER capsule Take 1 capsule (300 mg total) by mouth at bedtime.  30 capsule  2  . potassium chloride SA (K-DUR,KLOR-CON) 20 MEQ tablet Take 20 mEq by mouth 2 (two) times daily.      Marland Kitchen senna-docusate (SENOKOT-S) 8.6-50 MG per tablet Take 1 tablet by mouth at bedtime as needed for constipation.      . simvastatin (ZOCOR) 10 MG tablet Take 10 mg by mouth at bedtime.      . sulfamethoxazole-trimethoprim (BACTRIM DS) 800-160 MG per tablet Take 1 tablet by mouth 2 (two) times daily.      Marland Kitchen warfarin (COUMADIN) 2.5 MG tablet Take 1.25-2.5 mg by mouth every evening. 2.5mg /day except take 1.25mg  on MF        Assessment: 79 yo female with extensive PMH, CHF with HOCM presenting with anasarca and ARF in the setting of  decompensated HF.  Patient with hx DVT and afib pharmacy consulted to continue on warfarin.   INR beginning to trend down, although remains supratherapeutic today (3.52) despite only receiving 1 dose of Coumadin at her home dose on 6/18.  No bleeding or complications noted per chart notes.  Goal of Therapy:  INR 2-3   Plan:  1. No Warfarin today. 2. Daily INR  Thank you for allowing pharmacy to be a part of this patients care team.  Tad Moore, BCPS  Clinical Pharmacist Pager [phone]  01/04/2013 7:41 AM

## 2013-01-04 NOTE — Plan of Care (Signed)
Problem: Phase III Progression Outcomes Goal: Pain controlled on oral analgesia Outcome: Progressing Denies pain except when LE's ( bilat ) are elevated Goal: Activity at appropriate level-compared to baseline (UP IN CHAIR FOR HEMODIALYSIS)  Outcome: Progressing Decreased DOE

## 2013-01-05 LAB — RENAL FUNCTION PANEL
Albumin: 2.4 g/dL — ABNORMAL LOW (ref 3.5–5.2)
BUN: 19 mg/dL (ref 6–23)
CO2: 27 mEq/L (ref 19–32)
GFR calc Af Amer: 46 mL/min — ABNORMAL LOW (ref >90)
GFR calc Af Amer: 46 mL/min — ABNORMAL LOW (ref >90)
GFR calc non Af Amer: 40 mL/min — ABNORMAL LOW (ref >90)
Glucose, Bld: 101 mg/dL — ABNORMAL HIGH (ref 70–99)
Phosphorus: 1.8 mg/dL — ABNORMAL LOW (ref 2.3–4.6)
Potassium: 4.7 mEq/L (ref 3.5–5.1)
Potassium: 4.6 mEq/L (ref 3.5–5.1)
Sodium: 136 mEq/L (ref 135–145)
Sodium: 135 mEq/L (ref 135–145)

## 2013-01-05 LAB — POCT ACTIVATED CLOTTING TIME
Activated Clotting Time: 186 seconds
Activated Clotting Time: 186 seconds
Activated Clotting Time: 191 seconds
Activated Clotting Time: 191 seconds
Activated Clotting Time: 196 seconds
Activated Clotting Time: 201 seconds
Activated Clotting Time: 201 seconds

## 2013-01-05 LAB — GLUCOSE, CAPILLARY: Glucose-Capillary: 149 mg/dL — ABNORMAL HIGH (ref 70–99)

## 2013-01-05 LAB — PROTIME-INR
INR: 2.88 — ABNORMAL HIGH (ref 0.00–1.49)
Prothrombin Time: 28.7 seconds — ABNORMAL HIGH (ref 11.6–15.2)

## 2013-01-05 MED ORDER — WARFARIN 1.25 MG HALF TABLET
1.2500 mg | ORAL_TABLET | Freq: Once | ORAL | Status: AC
  Administered 2013-01-05: 1.25 mg via ORAL
  Filled 2013-01-05: qty 1

## 2013-01-05 MED ORDER — BOOST / RESOURCE BREEZE PO LIQD
1.0000 | ORAL | Status: DC
  Administered 2013-01-05 – 2013-01-11 (×7): 1 via ORAL

## 2013-01-05 MED ORDER — BOOST / RESOURCE BREEZE PO LIQD: 1.0000 | ORAL | Status: DC

## 2013-01-05 MED ORDER — ENSURE COMPLETE PO LIQD
237.0000 mL | ORAL | Status: DC
  Administered 2013-01-05 – 2013-01-11 (×6): 237 mL via ORAL

## 2013-01-05 NOTE — Progress Notes (Signed)
[facility]  DAILY PROGRESS NOTE   Subjective:  She never complains of anything, but she appears to be more comfortable, breathing less labored. Edema is less. There is a remarkable trend towards higher BP as more volume is removed with CVVH. Unfortunately she remains severely oliguric.  Objective:  Temp:  [97.3 F (36.3 C)-98.5 F (36.9 C)] 97.3 F (36.3 C) (06/23 0734) Pulse Rate:  [80-100] 83 (06/23 0800) Resp:  [17-38] 19 (06/23 0800) BP: (106-154)/(44-87) 149/87 mmHg (06/23 0800) SpO2:  [97 %-100 %] 98 % (06/23 0800) Weight:  [238 lb 5.1 oz (108.1 kg)] 238 lb 5.1 oz (108.1 kg) (06/23 0500) Weight change: -8 lb 13.1 oz (-4 kg)  Intake/Output from previous day: 06/22 0701 - 06/23 0700 In: 860 [P.O.:860] Out: 3861 [Urine:264]  Intake/Output from this shift: Total I/O In: 120 [P.O.:120] Out: 140 [Urine:25; Other:115]  Medications: Current Facility-Administered Medications  Medication Dose Route Frequency Provider Last Rate Last Dose  . 0.9 %  sodium chloride infusion  250 mL Intravenous PRN Brittainy Simmons, PA-C 10 mL/hr at 12/31/12 2100 250 mL at 12/31/12 2100  . acetaminophen (TYLENOL) tablet 650 mg  650 mg Oral Q4H PRN Brittainy Simmons, PA-C      . allopurinol (ZYLOPRIM) tablet 150 mg  150 mg Oral Daily Brittainy Simmons, PA-C   150 mg at 01/04/13 1048  . ALPRAZolam Prudy Feeler) tablet 0.25 mg  0.25 mg Oral BID PRN Brittainy Simmons, PA-C      . antiseptic oral rinse (BIOTENE) solution 15 mL  15 mL Mouth Rinse BID Chrystie Nose, MD   15 mL at 01/05/13 0800  . camphor-menthol (SARNA) lotion   Topical PRN Zigmund Gottron, MD      . doxycycline (VIBRA-TABS) tablet 100 mg  100 mg Oral Q12H Sadie Haber, MD   100 mg at 01/04/13 2154  . heparin 10,000 units/ 20 mL infusion syringe  250-3,000 Units/hr CRRT Continuous Sadie Haber, MD 3.8 mL/hr at 01/05/13 0800 1,900 Units/hr at 01/05/13 0800  . heparin bolus via infusion syringe  1,000 Units  1,000 Units CRRT PRN Sadie Haber, MD      . heparin injection 1,000-6,000 Units  1,000-6,000 Units CRRT PRN Sadie Haber, MD      . heparinized saline (2000 units/L) primer fluid for CRRT   CRRT PRN Sadie Haber, MD      . insulin aspart (novoLOG) injection 0-9 Units  0-9 Units Subcutaneous TID WC Chrystie Nose, MD   3 Units at 01/04/13 1233  . insulin aspart protamine- aspart (NOVOLOG 70/30) injection 10 Units  10 Units Subcutaneous Q breakfast Chrystie Nose, MD   10 Units at 01/05/13 [phone]  . insulin aspart protamine- aspart (NOVOLOG 70/30) injection 5 Units  5 Units Subcutaneous Q supper Chrystie Nose, MD   5 Units at 01/04/13 1748  . ondansetron (ZOFRAN) injection 4 mg  4 mg Intravenous Q6H PRN Brittainy Simmons, PA-C      . pantoprazole (PROTONIX) EC tablet 40 mg  40 mg Oral Daily Brittainy Simmons, PA-C   40 mg at 01/04/13 1048  . phenylephrine (NEO-SYNEPHRINE) 40,000 mcg in dextrose 5 % 250 mL infusion  30-200 mcg/min Intravenous Titrated Chrystie Nose, MD   60 mcg/min at 12/31/12 1825  . phenytoin (DILANTIN) ER capsule 300 mg  300 mg Oral QHS Brittainy Simmons, PA-C   300 mg at 01/04/13 2154  . prismasol BGK 4/2.5 5,000 mL dialysis replacement fluid  CRRT Continuous Sadie Haber, MD 300 mL/hr at 01/05/13 0209    . prismasol BGK 4/2.5 5,000 mL dialysis replacement fluid   CRRT Continuous Sadie Haber, MD 200 mL/hr at 01/05/13 0204    . prismasol BGK 4/2.5 5,000 mL dialysis solution   CRRT Continuous Sadie Haber, MD 1,000 mL/hr at 01/04/13 2256    . senna-docusate (Senokot-S) tablet 1 tablet  1 tablet Oral QHS PRN Brittainy Simmons, PA-C      . simvastatin (ZOCOR) tablet 10 mg  10 mg Oral QHS Brittainy Simmons, PA-C   10 mg at 01/04/13 2154  . sodium chloride 0.9 % injection 3 mL  3 mL Intravenous Q12H Brittainy Simmons, PA-C   3 mL at 01/04/13 2154  . sodium chloride 0.9 % injection 3 mL  3 mL Intravenous PRN Brittainy Simmons, PA-C      .  Warfarin - Pharmacist Dosing Inpatient   Does not apply q1800 Drake Leach Rumbarger, RPH      . zolpidem (AMBIEN) tablet 5 mg  5 mg Oral QHS PRN Robbie Lis, PA-C        Physical Exam: General appearance: alert, cooperative and no distress  Neck: no adenopathy, no carotid bruit, no JVD, supple, symmetrical, trachea midline and thyroid not enlarged, symmetric, no tenderness/mass/nodules  Lungs: diminished breath sounds bibasilar  Heart: regular rate and rhythm, S1, S2 normal and distant systolic murmur mid precordium  Abdomen: soft, non-tender; bowel sounds normal; no masses, no organomegaly  Extremities: edema 2+ lower extremities Pulses: 2+ and symmetric  in the upper extremities, Doppler only lower extremities  Skin: Skin color, texture, turgor normal. No rashes or lesions  Neurologic: Grossly normal   Lab Results: Results for orders placed during the hospital encounter of 12/30/12 (from the past 48 hour(s))  POCT ACTIVATED CLOTTING TIME     Status: None   Collection Time    01/03/13  8:17 AM      Result Value Range   Activated Clotting Time 211    GLUCOSE, CAPILLARY     Status: None   Collection Time    01/03/13  8:25 AM      Result Value Range   Glucose-Capillary 79  70 - 99 mg/dL  POCT ACTIVATED CLOTTING TIME     Status: None   Collection Time    01/03/13 11:57 AM      Result Value Range   Activated Clotting Time 206    GLUCOSE, CAPILLARY     Status: Abnormal   Collection Time    01/03/13 11:58 AM      Result Value Range   Glucose-Capillary 211 (*) 70 - 99 mg/dL  POCT ACTIVATED CLOTTING TIME     Status: None   Collection Time    01/03/13  3:51 PM      Result Value Range   Activated Clotting Time 217    RENAL FUNCTION PANEL     Status: Abnormal   Collection Time    01/03/13  3:55 PM      Result Value Range   Sodium 134 (*) 135 - 145 mEq/L   Potassium 4.9  3.5 - 5.1 mEq/L   Chloride 100  96 - 112 mEq/L   CO2 26  19 - 32 mEq/L   Glucose, Bld 122 (*) 70 - 99  mg/dL   BUN 23  6 - 23 mg/dL   Creatinine, Ser 1.61 (*) 0.50 - 1.10 mg/dL   Calcium 8.7  8.4 - 09.6 mg/dL  Phosphorus 2.3  2.3 - 4.6 mg/dL   Albumin 2.5 (*) 3.5 - 5.2 g/dL   GFR calc non Af Amer 27 (*) >90 mL/min   GFR calc Af Amer 31 (*) >90 mL/min   Comment:            The eGFR has been calculated     using the CKD EPI equation.     This calculation has not been     validated in all clinical     situations.     eGFR's persistently     <90 mL/min signify     possible Chronic Kidney Disease.  GLUCOSE, CAPILLARY     Status: Abnormal   Collection Time    01/03/13  5:51 PM      Result Value Range   Glucose-Capillary 100 (*) 70 - 99 mg/dL  POCT ACTIVATED CLOTTING TIME     Status: None   Collection Time    01/03/13  7:55 PM      Result Value Range   Activated Clotting Time 206    GLUCOSE, CAPILLARY     Status: Abnormal   Collection Time    01/03/13 10:04 PM      Result Value Range   Glucose-Capillary 123 (*) 70 - 99 mg/dL  POCT ACTIVATED CLOTTING TIME     Status: None   Collection Time    01/03/13 11:57 PM      Result Value Range   Activated Clotting Time 212    POCT ACTIVATED CLOTTING TIME     Status: None   Collection Time    01/04/13  3:54 AM      Result Value Range   Activated Clotting Time 206    PROTIME-INR     Status: Abnormal   Collection Time    01/04/13  4:00 AM      Result Value Range   Prothrombin Time 33.3 (*) 11.6 - 15.2 seconds   INR 3.52 (*) 0.00 - 1.49  MAGNESIUM     Status: None   Collection Time    01/04/13  4:00 AM      Result Value Range   Magnesium 2.3  1.5 - 2.5 mg/dL  APTT     Status: Abnormal   Collection Time    01/04/13  4:00 AM      Result Value Range   aPTT 80 (*) 24 - 37 seconds   Comment:            IF BASELINE aPTT IS ELEVATED,     SUGGEST PATIENT RISK ASSESSMENT     BE USED TO DETERMINE APPROPRIATE     ANTICOAGULANT THERAPY.  RENAL FUNCTION PANEL     Status: Abnormal   Collection Time    01/04/13  4:00 AM      Result Value  Range   Sodium 134 (*) 135 - 145 mEq/L   Potassium 4.8  3.5 - 5.1 mEq/L   Chloride 101  96 - 112 mEq/L   CO2 28  19 - 32 mEq/L   Glucose, Bld 86  70 - 99 mg/dL   BUN 21  6 - 23 mg/dL   Creatinine, Ser 1.61 (*) 0.50 - 1.10 mg/dL   Calcium 8.5  8.4 - 09.6 mg/dL   Phosphorus 2.1 (*) 2.3 - 4.6 mg/dL   Albumin 2.4 (*) 3.5 - 5.2 g/dL   GFR calc non Af Amer 34 (*) >90 mL/min   GFR calc Af Amer 39 (*) >90 mL/min   Comment:  The eGFR has been calculated     using the CKD EPI equation.     This calculation has not been     validated in all clinical     situations.     eGFR's persistently     <90 mL/min signify     possible Chronic Kidney Disease.  GLUCOSE, CAPILLARY     Status: None   Collection Time    01/04/13  7:45 AM      Result Value Range   Glucose-Capillary 81  70 - 99 mg/dL  POCT ACTIVATED CLOTTING TIME     Status: None   Collection Time    01/04/13  8:20 AM      Result Value Range   Activated Clotting Time 217    POCT ACTIVATED CLOTTING TIME     Status: None   Collection Time    01/04/13 11:49 AM      Result Value Range   Activated Clotting Time 206    GLUCOSE, CAPILLARY     Status: Abnormal   Collection Time    01/04/13 11:51 AM      Result Value Range   Glucose-Capillary 202 (*) 70 - 99 mg/dL  POCT ACTIVATED CLOTTING TIME     Status: None   Collection Time    01/04/13  3:57 PM      Result Value Range   Activated Clotting Time 211    RENAL FUNCTION PANEL     Status: Abnormal   Collection Time    01/04/13  4:00 PM      Result Value Range   Sodium 136  135 - 145 mEq/L   Potassium 4.8  3.5 - 5.1 mEq/L   Chloride 102  96 - 112 mEq/L   CO2 29  19 - 32 mEq/L   Glucose, Bld 90  70 - 99 mg/dL   BUN 19  6 - 23 mg/dL   Creatinine, Ser 4.69 (*) 0.50 - 1.10 mg/dL   Calcium 8.5  8.4 - 62.9 mg/dL   Phosphorus 1.9 (*) 2.3 - 4.6 mg/dL   Albumin 2.4 (*) 3.5 - 5.2 g/dL   GFR calc non Af Amer 39 (*) >90 mL/min   GFR calc Af Amer 46 (*) >90 mL/min   Comment:             The eGFR has been calculated     using the CKD EPI equation.     This calculation has not been     validated in all clinical     situations.     eGFR's persistently     <90 mL/min signify     possible Chronic Kidney Disease.  GLUCOSE, CAPILLARY     Status: Abnormal   Collection Time    01/04/13  4:47 PM      Result Value Range   Glucose-Capillary 110 (*) 70 - 99 mg/dL  POCT ACTIVATED CLOTTING TIME     Status: None   Collection Time    01/04/13  8:18 PM      Result Value Range   Activated Clotting Time 201    GLUCOSE, CAPILLARY     Status: Abnormal   Collection Time    01/04/13  9:44 PM      Result Value Range   Glucose-Capillary 137 (*) 70 - 99 mg/dL  POCT ACTIVATED CLOTTING TIME     Status: None   Collection Time    01/04/13 11:57 PM      Result Value Range  Activated Clotting Time 191    PROTIME-INR     Status: Abnormal   Collection Time    01/05/13  5:00 AM      Result Value Range   Prothrombin Time 28.7 (*) 11.6 - 15.2 seconds   INR 2.88 (*) 0.00 - 1.49  MAGNESIUM     Status: None   Collection Time    01/05/13  5:00 AM      Result Value Range   Magnesium 2.3  1.5 - 2.5 mg/dL  APTT     Status: Abnormal   Collection Time    01/05/13  5:00 AM      Result Value Range   aPTT 55 (*) 24 - 37 seconds   Comment:            IF BASELINE aPTT IS ELEVATED,     SUGGEST PATIENT RISK ASSESSMENT     BE USED TO DETERMINE APPROPRIATE     ANTICOAGULANT THERAPY.  RENAL FUNCTION PANEL     Status: Abnormal   Collection Time    01/05/13  5:00 AM      Result Value Range   Sodium 136  135 - 145 mEq/L   Potassium 4.7  3.5 - 5.1 mEq/L   Chloride 103  96 - 112 mEq/L   CO2 27  19 - 32 mEq/L   Glucose, Bld 101 (*) 70 - 99 mg/dL   BUN 19  6 - 23 mg/dL   Creatinine, Ser 9.52 (*) 0.50 - 1.10 mg/dL   Calcium 8.1 (*) 8.4 - 10.5 mg/dL   Phosphorus 2.5  2.3 - 4.6 mg/dL   Albumin 2.3 (*) 3.5 - 5.2 g/dL   GFR calc non Af Amer 40 (*) >90 mL/min   GFR calc Af Amer 46 (*) >90 mL/min    Comment:            The eGFR has been calculated     using the CKD EPI equation.     This calculation has not been     validated in all clinical     situations.     eGFR's persistently     <90 mL/min signify     possible Chronic Kidney Disease.  POCT ACTIVATED CLOTTING TIME     Status: None   Collection Time    01/05/13  5:23 AM      Result Value Range   Activated Clotting Time 186    POCT ACTIVATED CLOTTING TIME     Status: None   Collection Time    01/05/13  6:57 AM      Result Value Range   Activated Clotting Time 196    POCT ACTIVATED CLOTTING TIME     Status: None   Collection Time    01/05/13  8:10 AM      Result Value Range   Activated Clotting Time 196      Imaging: No results found.  Assessment:  1. Principal Problem: 2.   Acute on chronic diastolic CHF (congestive heart failure), NYHA class 3, 09/25/12 3. Active Problems: 4.   Sleep apnea on c-pap  5.   Aortic insufficiency: mild 6.   Chronic venous insufficiency with LE ulcers, will be followed at wound center 7.   Diabetes mellitus type 2 in obese 8.   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   CKD (chronic kidney disease) stage 3, GFR 30-59 ml/min 10.   History of Rt brain CVA 11.   Venous stasis of lower extremity 12.   Cardiorenal  syndrome with renal failure 13.   Cardiogenic shock 14.   Plan:  1. No longer in shock 2. Severe hypertrophic cardiomyopathy with restrictive physiology (could she have amyloidosis?) - very challenging to manage 3. Concomitant right heart dysfunction and pulmonary HTN (combination of left heart failure and OSA) 4. Oliguric acute on chronic renal failure - consider starting diuretics to see if we can increase UO; may need to go to [facility].  Time Spent Directly with Patient:  30 minutes  Length of Stay:  LOS: 6 days    Tameisha Covell 01/05/2013, 8:16 AM

## 2013-01-05 NOTE — Progress Notes (Signed)
ANTICOAGULATION CONSULT NOTE - Follow-up Consult  Pharmacy Consult:  Coumadin Indication: hx DVT / Afib  No Known Allergies  Patient Measurements: Height: 5\' 5"  (165.1 cm) Weight: 238 lb 5.1 oz (108.1 kg) IBW/kg (Calculated) : 57  Vital Signs: Temp: 97.3 F (36.3 C) (06/23 0734) Temp src: Oral (06/23 0734) BP: 149/87 mmHg (06/23 0800) Pulse Rate: 83 (06/23 0800)  Labs:  Recent Labs  01/03/13 0400  01/04/13 0400 01/04/13 1600 01/05/13 0500  APTT 65*  --  80*  --  55*  LABPROT 38.5*  --  33.3*  --  28.7*  INR 4.29*  --  3.52*  --  2.88*  CREATININE 1.75*  < > 1.45* 1.28* 1.27*  < > = values in this interval not displayed.  Estimated Creatinine Clearance: 45.3 ml/min (by C-G formula based on Cr of 1.27).      Assessment: 79 yo female with CHF with HOCM presenting with anasarca and ARF in the setting of decompensated HF.  Patient also with history of DVT and Afib to continue on Coumadin therapy.  INR has trended down and is now in therapeutic range.  No bleeding reported and last CBC WNL on 01/01/13.   Goal of Therapy:  INR 2-3    Plan:  - Coumadin 1.25 mg PO today - Daily PT / INR - Consider weekly CBC      Huma Imhoff D. Laney Potash, PharmD, BCPS Pager:  [phone] 01/05/2013, 9:07 AM

## 2013-01-05 NOTE — Progress Notes (Signed)
ASSESSMENT/RECOMMENDATIONS:  [name] with DM2, history of CVA, Sz disorder, prior hypertension, baseline CKD3 with baseline creatinine 1.5-1.7 as recently as 09/2012 (never worked up) and diastolic/right sided CHF with HOCM who presents with anasarca and acute renal failure on chronic in the setting of decompensated CHF, hypotension, refractory to IV diuretics.  Acute kidney injury on chronic with significant cardiorenal syndrome and massive diffuse anasarca  Remains oliguric  BP more stable  tolerating -125/hour off. Remains oliguric. I am more optimistic that she could tolerated conventional HD   Plan: will begin IHD in AM 6/24.  DC CRRT then  Subjective: Interval History:SOB after BM  Objective: Vital signs in last 24 hours: Temp:  [97.3 F (36.3 C)-98.2 F (36.8 C)] 97.5 F (36.4 C) (06/23 1200) Pulse Rate:  [81-97] 89 (06/23 1200) Resp:  [17-38] 25 (06/23 1200) BP: (106-189)/(44-88) 189/76 mmHg (06/23 1200) SpO2:  [97 %-100 %] 100 % (06/23 1200) Weight:  [108.1 kg (238 lb 5.1 oz)] 108.1 kg (238 lb 5.1 oz) (06/23 0500) Weight change: -4 kg (-8 lb 13.1 oz)  Intake/Output from previous day: 06/22 0701 - 06/23 0700 In: 860 [P.O.:860] Out: 3861 [Urine:264] Intake/Output this shift: Total I/O In: 490 [P.O.:480; I.V.:10] Out: 1095 [Urine:50; Other:1045]  General appearance: alert and cooperative appears fatigued Lungs clear Cor tachycardic Abdomen soft and distended Ext 2+ edema  Lab Results: No results found for this basename: WBC, HGB, HCT, PLT,  in the last 72 hours BMET:  Recent Labs  01/04/13 1600 01/05/13 0500  NA 136 136  K 4.8 4.7  CL 102 103  CO2 29 27  GLUCOSE 90 101*  BUN 19 19  CREATININE 1.28* 1.27*  CALCIUM 8.5 8.1*   No results found for this basename: PTH,  in the last 72 hours Iron Studies: No results found for this basename: IRON, TIBC, TRANSFERRIN, FERRITIN,  in the last 72 hours Studies/Results: No results found.  Scheduled: .  allopurinol  150 mg Oral Daily  . antiseptic oral rinse  15 mL Mouth Rinse BID  . doxycycline  100 mg Oral Q12H  . feeding supplement  237 mL Oral Q24H  . feeding supplement  1 Container Oral Q24H  . insulin aspart  0-9 Units Subcutaneous TID WC  . insulin aspart protamine- aspart  10 Units Subcutaneous Q breakfast  . insulin aspart protamine- aspart  5 Units Subcutaneous Q supper  . pantoprazole  40 mg Oral Daily  . phenytoin  300 mg Oral QHS  . simvastatin  10 mg Oral QHS  . sodium chloride  3 mL Intravenous Q12H  . warfarin  1.25 mg Oral ONCE-1800  . Warfarin - Pharmacist Dosing Inpatient   Does not apply q1800     LOS: 6 days   Shakina Choy C 01/05/2013,12:07 PM

## 2013-01-05 NOTE — Progress Notes (Signed)
INITIAL NUTRITION ASSESSMENT  DOCUMENTATION CODES Per approved criteria  -Obesity Unspecified   INTERVENTION: 1. Ensure Complete po daily, each supplement provides 350 kcal and 13 grams of protein.  2. Resource Breeze po daily, each supplement provides 250 kcal and 9 grams of protein.   NUTRITION DIAGNOSIS: Inadequate oral intake related to decreased appetite as evidenced by diet recall.   Goal: PO intake to meet >/=90% estimated nutrition needs.   Monitor:  PO intake, weight trends, ongoing HD, labs  Reason for Assessment: Consult- Assessment of nutrition needs  79 y.o. female  Admitting Dx: Acute on chronic diastolic CHF (congestive heart failure), NYHA class 3  ASSESSMENT: Pt admitted with increased shortness of breath and volume overload. Pt with recently d/c'd at 233 lbs and was readmitted at 256 lbs. Pt did not respond well to IV diuretics and renal function poor so was started on CVVHD. Pt weight now trending down. Per Cards, expect that fluid will re-accumulate once CVVHD is stopped.   Pt reports very poor appetite for about 2 months. Eating two small meals per day. Pt weight has been trending up, but likely has lost some body weight that has been masked by fluid gains.   Height: Ht Readings from Last 1 Encounters:  12/31/12 5\' 5"  (1.651 m)    Weight: Wt Readings from Last 1 Encounters:  01/05/13 238 lb 5.1 oz (108.1 kg)    Ideal Body Weight: 125 lbs   % Ideal Body Weight: 190 %  Wt Readings from Last 10 Encounters:  01/05/13 238 lb 5.1 oz (108.1 kg)  12/06/12 243 lb 11.2 oz (110.542 kg)  11/25/12 238 lb 12.8 oz (108.319 kg)  09/30/12 222 lb 12.8 oz (101.061 kg)  08/07/12 224 lb 13.9 oz (102 kg)  06/16/12 242 lb (109.77 kg)  06/16/12 242 lb (109.77 kg)  04/09/12 261 lb 12.8 oz (118.752 kg)  10/31/11 238 lb 9.6 oz (108.228 kg)  10/15/11 249 lb 6.4 oz (113.127 kg)    Usual Body Weight: 233 lbs at last d/c per notes  % Usual Body Weight: 102%  BMI:   Body mass index is 39.66 kg/(m^2). Obesity class 2  Estimated Nutritional Needs: Kcal: 2300-2500 Protein: >/= 118 gm  Fluid: urin output + 1000 cc  Skin: edema, Bilateral lower eg wounds related to edema  Diet Order: Renal  EDUCATION NEEDS: -No education needs identified at this time   Intake/Output Summary (Last 24 hours) at 01/05/13 0921 Last data filed at 01/05/13 0900  Gross per 24 hour  Intake    980 ml  Output   3887 ml  Net  -2907 ml    Last BM: 6/21   Labs:   Recent Labs Lab 01/03/13 0400  01/04/13 0400 01/04/13 1600 01/05/13 0500  NA 136  < > 134* 136 136  K 5.0  < > 4.8 4.8 4.7  CL 101  < > 101 102 103  CO2 28  < > 28 29 27   BUN 24*  < > 21 19 19   CREATININE 1.75*  < > 1.45* 1.28* 1.27*  CALCIUM 9.1  < > 8.5 8.5 8.1*  MG 2.4  --  2.3  --  2.3  PHOS 2.6  < > 2.1* 1.9* 2.5  GLUCOSE 91  < > 86 90 101*  < > = values in this interval not displayed.  CBG (last 3)   Recent Labs  01/04/13 1647 01/04/13 2144 01/05/13 0732  GLUCAP 110* 137* 87    Scheduled Meds: .  allopurinol  150 mg Oral Daily  . antiseptic oral rinse  15 mL Mouth Rinse BID  . doxycycline  100 mg Oral Q12H  . insulin aspart  0-9 Units Subcutaneous TID WC  . insulin aspart protamine- aspart  10 Units Subcutaneous Q breakfast  . insulin aspart protamine- aspart  5 Units Subcutaneous Q supper  . pantoprazole  40 mg Oral Daily  . phenytoin  300 mg Oral QHS  . simvastatin  10 mg Oral QHS  . sodium chloride  3 mL Intravenous Q12H  . warfarin  1.25 mg Oral ONCE-1800  . Warfarin - Pharmacist Dosing Inpatient   Does not apply q1800    Continuous Infusions: . heparin 10,000 units/ 20 mL infusion syringe 1,900 Units/hr (01/05/13 0900)  . phenylephrine (NEO-SYNEPHRINE) Adult infusion Stopped (01/01/13 0600)  . dialysis replacement fluid (prismasate) 300 mL/hr at 01/05/13 0209  . dialysis replacement fluid (prismasate) 200 mL/hr at 01/05/13 0204  . dialysate (PRISMASATE) 1,000 mL/hr at  01/04/13 2256    Past Medical History  Diagnosis Date  . Hypertension   . Venous stasis of lower extremity WEARS TEDS    BLE--  CURRENTLY NO ULCERS/ WOUNDS  . Aortic insufficiency     mild/H&P 04/02/2012  . Type II diabetes mellitus   . OSA on CPAP   . Pulmonary hypertension per last echo 03-28-2012  mod.-severe     per dr wert note this related to L ht pressures from diastolic chf  . Hypertrophic obstructive cardiomyopathy(425.11)   . Arthritis   . CKD (chronic kidney disease) stage 3, GFR 30-59 ml/min   . Heart murmur   . History of DVT of lower extremity lower right leg nov 2012  . Hyperlipidemia   . History of gout 06-09-2012  per pt stable  . Chronic diastolic CHF (congestive heart failure), NYHA class 3 CARDIOLOGIST- DR HILTY--  LOV OCT 2013--  REQUESTED NOTE, EGK AND STRESS TEST    PER PT ON 06-09-2012  S&S OF CHF AND WT IS DOWN  . H/O hiatal hernia   . GERD (gastroesophageal reflux disease)   . Lower extremity edema   . PAF (paroxysmal atrial fibrillation), in SR on admit 09/25/2012  . Stroke Jan 2014    Rt brain, Nl CA doppler  . TIA (transient ischemic attack) 08/07/2012    2D Echo - EF 60-65, severe concentric hypertrophy in the left ventricle  . Dyspnea on exertion 11/30/2010    Gated Stress Dipyridamole Myocardial Perfusion - post stress EF-63, normal study, no ischemia demonstrated     Past Surgical History  Procedure Laterality Date  . Total knee arthroplasty  1988; 1998 X2    right; bilateral  . Tumor excision  1970's    "off my stomach"  . Cataract extraction w/ intraocular lens implant  ~ 2010    right; "put lens in; didn't work; had mass on my eye"  . Transthoracic echocardiogram  04-03-2012   DR KENNETH HILTY    LVSF  EF 80-85%/ HYPERTRONIC CARDIOMYOPATHY WITH MOSTLY MID-CAVITARY OBSTRUCTION AND A SMALLER COMPONENT OF LV OUTFLOW TRACT OBSTRUCTION/   EVIDENCE OF SEVERE DECOMPRESATION OF DIASTOLIC HEART FAILURE AND MODERATE TO SEVERE PULMONARY ARTERIAL  HYPERTENSION  . Joint replacement  '88 and '98    TOTAL RIGHT KNEE X2  1988  &  1998 (DUE TO JOINT MALFUNCTION)   TOTAL LEFT KNEE 1998  . Carpometacarpel suspension plasty  06/16/2012    Procedure: [facility]) SUSPENSION PLASTY;  Surgeon: Jodi Marble, MD;  Location: [facility];  Service: Orthopedics;  Laterality: Right;  RIGHT THUMB SUSPENSION ARTHROPLASTY.  Marland Kitchen Eye surgery Right ~ 2011    "not a cataract; just a growth" (09/26/2012)  . Cardiac catheterization Bilateral 01/19/2004    NL cors. neg Nuc 2012    Clarene Duke RD, LDN Pager [phone] After Hours pager [phone]

## 2013-01-06 DIAGNOSIS — I130 Hypertensive heart and chronic kidney disease with heart failure and stage 1 through stage 4 chronic kidney disease, or unspecified chronic kidney disease: Secondary | ICD-10-CM

## 2013-01-06 LAB — RENAL FUNCTION PANEL
Albumin: 2.5 g/dL — ABNORMAL LOW (ref 3.5–5.2)
BUN: 18 mg/dL (ref 6–23)
CO2: 27 mEq/L (ref 19–32)
Chloride: 102 mEq/L (ref 96–112)
Creatinine, Ser: 1.14 mg/dL — ABNORMAL HIGH (ref 0.50–1.10)

## 2013-01-06 LAB — APTT: aPTT: 60 seconds — ABNORMAL HIGH (ref 24–37)

## 2013-01-06 LAB — GLUCOSE, CAPILLARY
Glucose-Capillary: 114 mg/dL — ABNORMAL HIGH (ref 70–99)
Glucose-Capillary: 94 mg/dL (ref 70–99)
Glucose-Capillary: 163 mg/dL — ABNORMAL HIGH (ref 70–99)

## 2013-01-06 LAB — PROTIME-INR: Prothrombin Time: 25.3 seconds — ABNORMAL HIGH (ref 11.6–15.2)

## 2013-01-06 LAB — HEPATITIS B CORE ANTIBODY, TOTAL: Hep B Core Total Ab: NEGATIVE

## 2013-01-06 MED ORDER — LIDOCAINE HCL (PF) 1 % IJ SOLN: 5.0000 mL | INTRAMUSCULAR | Status: DC | PRN

## 2013-01-06 MED ORDER — ALTEPLASE 2 MG IJ SOLR: 2.0000 mg | Freq: Once | INTRAMUSCULAR | Status: AC | PRN

## 2013-01-06 MED ORDER — PENTAFLUOROPROP-TETRAFLUOROETH EX AERO: 1.0000 "application " | INHALATION_SPRAY | CUTANEOUS | Status: DC | PRN

## 2013-01-06 MED ORDER — NEPRO/CARBSTEADY PO LIQD: 237.0000 mL | ORAL | Status: DC | PRN

## 2013-01-06 MED ORDER — WARFARIN SODIUM 2 MG PO TABS
2.0000 mg | ORAL_TABLET | Freq: Once | ORAL | Status: AC
  Administered 2013-01-06: 2 mg via ORAL
  Filled 2013-01-06: qty 1

## 2013-01-06 MED ORDER — LIDOCAINE-PRILOCAINE 2.5-2.5 % EX CREA: 1.0000 "application " | TOPICAL_CREAM | CUTANEOUS | Status: DC | PRN

## 2013-01-06 MED ORDER — HEPARIN SODIUM (PORCINE) 1000 UNIT/ML DIALYSIS
20.0000 [IU]/kg | INTRAMUSCULAR | Status: DC | PRN
  Administered 2013-01-06 – 2013-01-07 (×2): 2200 [IU] via INTRAVENOUS_CENTRAL

## 2013-01-06 MED ORDER — HEPARIN SODIUM (PORCINE) 1000 UNIT/ML DIALYSIS
1000.0000 [IU] | INTRAMUSCULAR | Status: DC | PRN
  Administered 2013-01-06: 2800 [IU] via INTRAVENOUS_CENTRAL

## 2013-01-06 MED ORDER — SODIUM CHLORIDE 0.9 % IV SOLN: 100.0000 mL | INTRAVENOUS | Status: DC | PRN

## 2013-01-06 NOTE — Plan of Care (Signed)
CRRT machine beeped and observed message: "filter clotted" during bath. Treatment ended. Unable to return blood HD catheter flushed with NS and heparinarized with 1.32ml per volume on each port for total of 2.59mls. HD notified, Spoke with Vonna Kotyk, will make pt 1st case when am HD RN gets here. Will cont to monitor.

## 2013-01-06 NOTE — Progress Notes (Signed)
ASSESSMENT/RECOMMENDATIONS:  [name] with DM2, history of CVA, Sz disorder, prior hypertension, baseline CKD3 with baseline creatinine 1.5-1.7 as recently as 09/2012 (never worked up) and diastolic/right sided CHF with HOCM who presents with anasarca and acute renal failure on chronic in the setting of decompensated CHF, hypotension, refractory to IV diuretics.  Acute kidney injury on chronic with significant cardiorenal syndrome and massive diffuse anasarca-improving  Remains oliguric   Plan: will begin IHD today  Subjective: Interval History: c/o dyspnea  Objective: Vital signs in last 24 hours: Temp:  [97.4 F (36.3 C)-97.8 F (36.6 C)] 97.8 F (36.6 C) (06/24 0800) Pulse Rate:  [78-101] 92 (06/24 0800) Resp:  [18-37] 24 (06/24 0800) BP: (124-189)/(33-103) 128/56 mmHg (06/24 0800) SpO2:  [95 %-100 %] 100 % (06/24 0800) Weight:  [105.9 kg (233 lb 7.5 oz)] 105.9 kg (233 lb 7.5 oz) (06/24 0500) Weight change: -2.2 kg (-4 lb 13.6 oz)  Intake/Output from previous day: 06/23 0701 - 06/24 0700 In: 1040 [P.O.:1020; I.V.:20] Out: 3845 [Urine:206] Intake/Output this shift: Total I/O In: -  Out: 10 [Urine:10]  General appearance: alert and cooperative Chest wall: no tenderness Cardio: regular rate and rhythm, S1, S2 normal, no murmur, click, rub or gallop GI: soft, non-tender; bowel sounds normal; no masses,  no organomegaly Extremities: edema 2+  Lab Results: No results found for this basename: WBC, HGB, HCT, PLT,  in the last 72 hours BMET:  Recent Labs  01/05/13 1600 01/06/13 0420  NA 135 137  K 4.6 4.5  CL 101 102  CO2 27 27  GLUCOSE 102* 106*  BUN 19 18  CREATININE 1.26* 1.14*  CALCIUM 8.5 8.6   No results found for this basename: PTH,  in the last 72 hours Iron Studies: No results found for this basename: IRON, TIBC, TRANSFERRIN, FERRITIN,  in the last 72 hours Studies/Results: No results found.  Scheduled: . allopurinol  150 mg Oral Daily  . antiseptic  oral rinse  15 mL Mouth Rinse BID  . doxycycline  100 mg Oral Q12H  . feeding supplement  237 mL Oral Q24H  . feeding supplement  1 Container Oral Q24H  . insulin aspart  0-9 Units Subcutaneous TID WC  . insulin aspart protamine- aspart  10 Units Subcutaneous Q breakfast  . insulin aspart protamine- aspart  5 Units Subcutaneous Q supper  . pantoprazole  40 mg Oral Daily  . phenytoin  300 mg Oral QHS  . simvastatin  10 mg Oral QHS  . sodium chloride  3 mL Intravenous Q12H  . warfarin  2 mg Oral ONCE-1800  . Warfarin - Pharmacist Dosing Inpatient   Does not apply q1800    LOS: 7 days   Stefanny Pieri C 01/06/2013,9:13 AM

## 2013-01-06 NOTE — Progress Notes (Signed)
ANTICOAGULATION CONSULT NOTE - Follow-up Consult  Pharmacy Consult:  Coumadin Indication: hx DVT / Afib  No Known Allergies  Patient Measurements: Height: 5\' 5"  (165.1 cm) Weight: 233 lb 7.5 oz (105.9 kg) IBW/kg (Calculated) : 57  Vital Signs: Temp: 97.8 F (36.6 C) (06/24 0800) Temp src: Oral (06/24 0800) BP: 124/57 mmHg (06/24 0700) Pulse Rate: 100 (06/24 0700)  Labs:  Recent Labs  01/04/13 0400  01/05/13 0500 01/05/13 1600 01/06/13 0420  APTT 80*  --  55*  --  60*  LABPROT 33.3*  --  28.7*  --  25.3*  INR 3.52*  --  2.88*  --  2.43*  CREATININE 1.45*  < > 1.27* 1.26* 1.14*  < > = values in this interval not displayed.  Estimated Creatinine Clearance: 50 ml/min (by C-G formula based on Cr of 1.14).      Assessment: 79 yo female with CHF with HOCM presenting with anasarca and ARF in the setting of decompensated HF.  Patient also with history of DVT and Afib to continue on Coumadin therapy.  INR remains in therapeutic range although it continues to trend down.  No bleeding reported and last CBC WNL on 01/01/13.   Goal of Therapy:  INR 2-3    Plan:  - Coumadin 2mg  PO today - Daily PT / INR - Consider weekly CBC - F/U abx LOT     Bradey Luzier D. Laney Potash, PharmD, BCPS Pager:  [phone] 01/06/2013, 8:46 AM

## 2013-01-06 NOTE — Progress Notes (Signed)
DAILY PROGRESS NOTE  Subjective:  BP has steadily improved with volume removal - she is no longer in shock. Creatinine has improved, but urine output remains low. I expect that she may be able to tolerate conventional HD.    Objective:  Temp:  [97.4 F (36.3 C)-97.8 F (36.6 C)] 97.8 F (36.6 C) (06/24 0400) Pulse Rate:  [78-101] 100 (06/24 0700) Resp:  [18-37] 20 (06/24 0700) BP: (124-189)/(33-103) 124/57 mmHg (06/24 0700) SpO2:  [95 %-100 %] 100 % (06/24 0700) Weight:  [233 lb 7.5 oz (105.9 kg)] 233 lb 7.5 oz (105.9 kg) (06/24 0500) Weight change: -4 lb 13.6 oz (-2.2 kg)  Intake/Output from previous day: 06/23 0701 - 06/24 0700 In: 1040 [P.O.:1020; I.V.:20] Out: 3845 [Urine:206]  Intake/Output from this shift:    Medications: Current Facility-Administered Medications  Medication Dose Route Frequency Provider Last Rate Last Dose  . 0.9 %  sodium chloride infusion  250 mL Intravenous PRN Brittainy Simmons, PA-C 10 mL/hr at 12/31/12 2100 250 mL at 12/31/12 2100  . 0.9 %  sodium chloride infusion  100 mL Intravenous PRN Lauris Poag, MD      . 0.9 %  sodium chloride infusion  100 mL Intravenous PRN Lauris Poag, MD      . acetaminophen (TYLENOL) tablet 650 mg  650 mg Oral Q4H PRN Brittainy Simmons, PA-C      . allopurinol (ZYLOPRIM) tablet 150 mg  150 mg Oral Daily Brittainy Simmons, PA-C   150 mg at 01/05/13 0911  . ALPRAZolam Prudy Feeler) tablet 0.25 mg  0.25 mg Oral BID PRN Brittainy Simmons, PA-C      . alteplase (CATHFLO ACTIVASE) injection 2 mg  2 mg Intracatheter Once PRN Lauris Poag, MD      . antiseptic oral rinse (BIOTENE) solution 15 mL  15 mL Mouth Rinse BID Chrystie Nose, MD   15 mL at 01/05/13 2000  . camphor-menthol (SARNA) lotion   Topical PRN Zigmund Gottron, MD      . doxycycline (VIBRA-TABS) tablet 100 mg  100 mg Oral Q12H Sadie Haber, MD   100 mg at 01/05/13 2115  . feeding supplement (ENSURE COMPLETE) liquid 237 mL  237 mL Oral Q24H Tonye Becket, RD   237 mL at 01/05/13 1030  . feeding supplement (NEPRO CARB STEADY) liquid 237 mL  237 mL Oral PRN Lauris Poag, MD      . feeding supplement (RESOURCE BREEZE) liquid 1 Container  1 Container Oral Q24H Chrystie Nose, MD   1 Container at 01/05/13 1640  . heparin 10,000 units/ 20 mL infusion syringe  250-3,000 Units/hr CRRT Continuous Sadie Haber, MD 3.9 mL/hr at 01/06/13 0400 1,950 Units/hr at 01/06/13 0400  . heparin bolus via infusion syringe 1,000 Units  1,000 Units CRRT PRN Sadie Haber, MD      . heparin injection 1,000 Units  1,000 Units Dialysis PRN Lauris Poag, MD      . heparin injection 1,000-6,000 Units  1,000-6,000 Units CRRT PRN Sadie Haber, MD   1,400 Units at 01/06/13 0432  . heparin injection 2,200 Units  20 Units/kg Dialysis PRN Lauris Poag, MD      . heparinized saline (2000 units/L) primer fluid for CRRT   CRRT PRN Sadie Haber, MD      . insulin aspart (novoLOG) injection 0-9 Units  0-9 Units Subcutaneous TID WC Chrystie Nose, MD   1 Units at 01/05/13 1122  .  insulin aspart protamine- aspart (NOVOLOG 70/30) injection 10 Units  10 Units Subcutaneous Q breakfast Chrystie Nose, MD   10 Units at 01/05/13 [phone]  . insulin aspart protamine- aspart (NOVOLOG 70/30) injection 5 Units  5 Units Subcutaneous Q supper Chrystie Nose, MD   5 Units at 01/05/13 1715  . lidocaine (PF) (XYLOCAINE) 1 % injection 5 mL  5 mL Intradermal PRN Lauris Poag, MD      . lidocaine-prilocaine (EMLA) cream 1 application  1 application Topical PRN Lauris Poag, MD      . ondansetron [facility]) injection 4 mg  4 mg Intravenous Q6H PRN Brittainy Simmons, PA-C      . pantoprazole (PROTONIX) EC tablet 40 mg  40 mg Oral Daily Brittainy Simmons, PA-C   40 mg at 01/05/13 0911  . pentafluoroprop-tetrafluoroeth (GEBAUERS) aerosol 1 application  1 application Topical PRN Lauris Poag, MD      . phenylephrine (NEO-SYNEPHRINE) 40,000 mcg in dextrose 5 % 250 mL infusion   30-200 mcg/min Intravenous Titrated Chrystie Nose, MD   60 mcg/min at 12/31/12 1825  . phenytoin (DILANTIN) ER capsule 300 mg  300 mg Oral QHS Brittainy Simmons, PA-C   300 mg at 01/05/13 2115  . prismasol BGK 4/2.5 5,000 mL dialysis replacement fluid   CRRT Continuous Sadie Haber, MD 300 mL/hr at 01/05/13 2016    . prismasol BGK 4/2.5 5,000 mL dialysis replacement fluid   CRRT Continuous Sadie Haber, MD 200 mL/hr at 01/05/13 0204    . prismasol BGK 4/2.5 5,000 mL dialysis solution   CRRT Continuous Sadie Haber, MD 1,000 mL/hr at 01/06/13 0121    . senna-docusate (Senokot-S) tablet 1 tablet  1 tablet Oral QHS PRN Robbie Lis, PA-C   1 tablet at 01/05/13 0958  . simvastatin (ZOCOR) tablet 10 mg  10 mg Oral QHS Brittainy Simmons, PA-C   10 mg at 01/05/13 2115  . sodium chloride 0.9 % injection 3 mL  3 mL Intravenous Q12H Brittainy Simmons, PA-C   10 mL at 01/05/13 2116  . sodium chloride 0.9 % injection 3 mL  3 mL Intravenous PRN Brittainy Simmons, PA-C      . Warfarin - Pharmacist Dosing Inpatient   Does not apply q1800 Drake Leach Rumbarger, RPH      . zolpidem (AMBIEN) tablet 5 mg  5 mg Oral QHS PRN Robbie Lis, PA-C        Physical Exam: General appearance: alert and no distress Neck: no adenopathy, no carotid bruit, no JVD, supple, symmetrical, trachea midline and thyroid not enlarged, symmetric, no tenderness/mass/nodules Lungs: clear to auscultation bilaterally Heart: regular rate and rhythm, S1, S2 normal, 3/6 SEM at LUSB Abdomen: soft, obese, non-tender; bowel sounds normal; no masses,  no organomegaly Extremities: extremities with 1-2+ edema, bilateral compression wraps Pulses: 2+ and symmetric Skin: eccyhmosis over the right anterior chest Neurologic: Grossly normal  Lab Results: Results for orders placed during the hospital encounter of 12/30/12 (from the past 48 hour(s))  POCT ACTIVATED CLOTTING TIME     Status: None   Collection Time    01/04/13   8:20 AM      Result Value Range   Activated Clotting Time 217    POCT ACTIVATED CLOTTING TIME     Status: None   Collection Time    01/04/13 11:49 AM      Result Value Range   Activated Clotting Time 206    GLUCOSE, CAPILLARY  Status: Abnormal   Collection Time    01/04/13 11:51 AM      Result Value Range   Glucose-Capillary 202 (*) 70 - 99 mg/dL  POCT ACTIVATED CLOTTING TIME     Status: None   Collection Time    01/04/13  3:57 PM      Result Value Range   Activated Clotting Time 211    RENAL FUNCTION PANEL     Status: Abnormal   Collection Time    01/04/13  4:00 PM      Result Value Range   Sodium 136  135 - 145 mEq/L   Potassium 4.8  3.5 - 5.1 mEq/L   Chloride 102  96 - 112 mEq/L   CO2 29  19 - 32 mEq/L   Glucose, Bld 90  70 - 99 mg/dL   BUN 19  6 - 23 mg/dL   Creatinine, Ser 1.61 (*) 0.50 - 1.10 mg/dL   Calcium 8.5  8.4 - 09.6 mg/dL   Phosphorus 1.9 (*) 2.3 - 4.6 mg/dL   Albumin 2.4 (*) 3.5 - 5.2 g/dL   GFR calc non Af Amer 39 (*) >90 mL/min   GFR calc Af Amer 46 (*) >90 mL/min   Comment:            The eGFR has been calculated     using the CKD EPI equation.     This calculation has not been     validated in all clinical     situations.     eGFR's persistently     <90 mL/min signify     possible Chronic Kidney Disease.  GLUCOSE, CAPILLARY     Status: Abnormal   Collection Time    01/04/13  4:47 PM      Result Value Range   Glucose-Capillary 110 (*) 70 - 99 mg/dL  POCT ACTIVATED CLOTTING TIME     Status: None   Collection Time    01/04/13  8:18 PM      Result Value Range   Activated Clotting Time 201    GLUCOSE, CAPILLARY     Status: Abnormal   Collection Time    01/04/13  9:44 PM      Result Value Range   Glucose-Capillary 137 (*) 70 - 99 mg/dL  POCT ACTIVATED CLOTTING TIME     Status: None   Collection Time    01/04/13 11:57 PM      Result Value Range   Activated Clotting Time 191    PROTIME-INR     Status: Abnormal   Collection Time     01/05/13  5:00 AM      Result Value Range   Prothrombin Time 28.7 (*) 11.6 - 15.2 seconds   INR 2.88 (*) 0.00 - 1.49  MAGNESIUM     Status: None   Collection Time    01/05/13  5:00 AM      Result Value Range   Magnesium 2.3  1.5 - 2.5 mg/dL  APTT     Status: Abnormal   Collection Time    01/05/13  5:00 AM      Result Value Range   aPTT 55 (*) 24 - 37 seconds   Comment:            IF BASELINE aPTT IS ELEVATED,     SUGGEST PATIENT RISK ASSESSMENT     BE USED TO DETERMINE APPROPRIATE     ANTICOAGULANT THERAPY.  RENAL FUNCTION PANEL     Status: Abnormal  Collection Time    01/05/13  5:00 AM      Result Value Range   Sodium 136  135 - 145 mEq/L   Potassium 4.7  3.5 - 5.1 mEq/L   Chloride 103  96 - 112 mEq/L   CO2 27  19 - 32 mEq/L   Glucose, Bld 101 (*) 70 - 99 mg/dL   BUN 19  6 - 23 mg/dL   Creatinine, Ser 1.61 (*) 0.50 - 1.10 mg/dL   Calcium 8.1 (*) 8.4 - 10.5 mg/dL   Phosphorus 2.5  2.3 - 4.6 mg/dL   Albumin 2.3 (*) 3.5 - 5.2 g/dL   GFR calc non Af Amer 40 (*) >90 mL/min   GFR calc Af Amer 46 (*) >90 mL/min   Comment:            The eGFR has been calculated     using the CKD EPI equation.     This calculation has not been     validated in all clinical     situations.     eGFR's persistently     <90 mL/min signify     possible Chronic Kidney Disease.  POCT ACTIVATED CLOTTING TIME     Status: None   Collection Time    01/05/13  5:23 AM      Result Value Range   Activated Clotting Time 186    POCT ACTIVATED CLOTTING TIME     Status: None   Collection Time    01/05/13  6:57 AM      Result Value Range   Activated Clotting Time 196    GLUCOSE, CAPILLARY     Status: None   Collection Time    01/05/13  7:32 AM      Result Value Range   Glucose-Capillary 87  70 - 99 mg/dL  POCT ACTIVATED CLOTTING TIME     Status: None   Collection Time    01/05/13  8:10 AM      Result Value Range   Activated Clotting Time 196    POCT ACTIVATED CLOTTING TIME     Status: None    Collection Time    01/05/13  9:05 AM      Result Value Range   Activated Clotting Time 196    GLUCOSE, CAPILLARY     Status: Abnormal   Collection Time    01/05/13 11:18 AM      Result Value Range   Glucose-Capillary 149 (*) 70 - 99 mg/dL   Comment 1 Documented in Chart     Comment 2 Notify RN    POCT ACTIVATED CLOTTING TIME     Status: None   Collection Time    01/05/13  1:46 PM      Result Value Range   Activated Clotting Time 191    RENAL FUNCTION PANEL     Status: Abnormal   Collection Time    01/05/13  4:00 PM      Result Value Range   Sodium 135  135 - 145 mEq/L   Potassium 4.6  3.5 - 5.1 mEq/L   Chloride 101  96 - 112 mEq/L   CO2 27  19 - 32 mEq/L   Glucose, Bld 102 (*) 70 - 99 mg/dL   BUN 19  6 - 23 mg/dL   Creatinine, Ser 0.96 (*) 0.50 - 1.10 mg/dL   Calcium 8.5  8.4 - 04.5 mg/dL   Phosphorus 1.8 (*) 2.3 - 4.6 mg/dL   Albumin 2.4 (*) 3.5 -  5.2 g/dL   GFR calc non Af Amer 40 (*) >90 mL/min   GFR calc Af Amer 46 (*) >90 mL/min   Comment:            The eGFR has been calculated     using the CKD EPI equation.     This calculation has not been     validated in all clinical     situations.     eGFR's persistently     <90 mL/min signify     possible Chronic Kidney Disease.  GLUCOSE, CAPILLARY     Status: None   Collection Time    01/05/13  4:29 PM      Result Value Range   Glucose-Capillary 94  70 - 99 mg/dL   Comment 1 Documented in Chart     Comment 2 Notify RN    POCT ACTIVATED CLOTTING TIME     Status: None   Collection Time    01/05/13  5:22 PM      Result Value Range   Activated Clotting Time 186    POCT ACTIVATED CLOTTING TIME     Status: None   Collection Time    01/05/13  6:07 PM      Result Value Range   Activated Clotting Time 201    POCT ACTIVATED CLOTTING TIME     Status: None   Collection Time    01/05/13  7:07 PM      Result Value Range   Activated Clotting Time 196    POCT ACTIVATED CLOTTING TIME     Status: None   Collection Time     01/05/13  7:56 PM      Result Value Range   Activated Clotting Time 201    GLUCOSE, CAPILLARY     Status: None   Collection Time    01/05/13  9:30 PM      Result Value Range   Glucose-Capillary 86  70 - 99 mg/dL  POCT ACTIVATED CLOTTING TIME     Status: None   Collection Time    01/05/13 11:59 PM      Result Value Range   Activated Clotting Time 201    POCT ACTIVATED CLOTTING TIME     Status: None   Collection Time    01/06/13  3:38 AM      Result Value Range   Activated Clotting Time 191    PROTIME-INR     Status: Abnormal   Collection Time    01/06/13  4:20 AM      Result Value Range   Prothrombin Time 25.3 (*) 11.6 - 15.2 seconds   INR 2.43 (*) 0.00 - 1.49  MAGNESIUM     Status: None   Collection Time    01/06/13  4:20 AM      Result Value Range   Magnesium 2.2  1.5 - 2.5 mg/dL  APTT     Status: Abnormal   Collection Time    01/06/13  4:20 AM      Result Value Range   aPTT 60 (*) 24 - 37 seconds   Comment:            IF BASELINE aPTT IS ELEVATED,     SUGGEST PATIENT RISK ASSESSMENT     BE USED TO DETERMINE APPROPRIATE     ANTICOAGULANT THERAPY.  RENAL FUNCTION PANEL     Status: Abnormal   Collection Time    01/06/13  4:20 AM      Result Value  Range   Sodium 137  135 - 145 mEq/L   Potassium 4.5  3.5 - 5.1 mEq/L   Chloride 102  96 - 112 mEq/L   CO2 27  19 - 32 mEq/L   Glucose, Bld 106 (*) 70 - 99 mg/dL   BUN 18  6 - 23 mg/dL   Creatinine, Ser 8.41 (*) 0.50 - 1.10 mg/dL   Calcium 8.6  8.4 - 32.4 mg/dL   Phosphorus 2.0 (*) 2.3 - 4.6 mg/dL   Albumin 2.5 (*) 3.5 - 5.2 g/dL   GFR calc non Af Amer 45 (*) >90 mL/min   GFR calc Af Amer 52 (*) >90 mL/min   Comment:            The eGFR has been calculated     using the CKD EPI equation.     This calculation has not been     validated in all clinical     situations.     eGFR's persistently     <90 mL/min signify     possible Chronic Kidney Disease.    Imaging: No results found.  Assessment:  1. Principal  Problem: 2.   Acute on chronic diastolic CHF (congestive heart failure), NYHA class 3, 09/25/12 3. Active Problems: 4.   Sleep apnea on c-pap  5.   Aortic insufficiency: mild 6.   Chronic venous insufficiency with LE ulcers, will be followed at wound center 7.   Diabetes mellitus type 2 in obese 8.   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   CKD (chronic kidney disease) stage 3, GFR 30-59 ml/min 10.   History of Rt brain CVA 11.   PAF (paroxysmal atrial fibrillation),  12.   Long term (current) use of anticoagulants 13.   Cardiorenal syndrome with renal failure 14.   Cardiogenic shock - resolved 15.   Plan:  1. Ms. Kerper's blood pressure appears to be doing better with volume removal, probably due to her severe LV mass and restrictive physiology. Plan for conventional HD today.  She is maintaining sinus rhythm. INR therapeutic on warfarin. Her cardiomyopathy may infiltrative, such as amyloid - cMRI or strain echo may suggest the diagnosis, however, the gold standard is cardiac biopsy. Given the limited treatment options, this may only be academic or helpful for prognosis. If her bp tolerates, it may be helpful to re-introduce low dose b-blocker at some point.  Time Spent Directly with Patient:  15 minutes  Length of Stay:  LOS: 7 days   Chrystie Nose, MD, [facility] Attending Cardiologist [facility]  HILTY,Kenneth C 01/06/2013, 8:03 AM

## 2013-01-06 NOTE — Plan of Care (Signed)
Problem: Phase I Progression Outcomes Goal: EF % per last Echo/documented,Core Reminder form on chart Outcome: Completed/Met Date Met:  01/06/13 80% 12/31/12

## 2013-01-06 NOTE — Progress Notes (Signed)
Met with [name] at bedside. She is currently active with [facility] services. She is currently undergoing HD at bedside. Made her aware that [facility] will continue to follow. Left contact information at bedside.  Raiford Noble, MSN- Ed, RN,BSN- [facility] [phone]

## 2013-01-07 ENCOUNTER — Ambulatory Visit: Payer: Medicare Other | Admitting: Pharmacist Clinician (PhC)/ Clinical Pharmacy Specialist

## 2013-01-07 DIAGNOSIS — N186 End stage renal disease: Secondary | ICD-10-CM

## 2013-01-07 LAB — PROTIME-INR: INR: 2.39 — ABNORMAL HIGH (ref 0.00–1.49)

## 2013-01-07 MED ORDER — NEPRO/CARBSTEADY PO LIQD: 237.0000 mL | ORAL | Status: DC | PRN

## 2013-01-07 MED ORDER — SODIUM CHLORIDE 0.9 % IV SOLN: 100.0000 mL | INTRAVENOUS | Status: DC | PRN

## 2013-01-07 MED ORDER — ALTEPLASE 2 MG IJ SOLR: 2.0000 mg | Freq: Once | INTRAMUSCULAR | Status: DC | PRN

## 2013-01-07 MED ORDER — CARVEDILOL 3.125 MG PO TABS
3.1250 mg | ORAL_TABLET | Freq: Two times a day (BID) | ORAL | Status: DC
  Administered 2013-01-07 – 2013-01-11 (×9): 3.125 mg via ORAL
  Filled 2013-01-07 (×12): qty 1

## 2013-01-07 MED ORDER — HEPARIN SODIUM (PORCINE) 1000 UNIT/ML IJ SOLN
2800.0000 [IU] | Freq: Once | INTRAMUSCULAR | Status: AC
  Administered 2013-01-07: 2800 [IU] via INTRAVENOUS

## 2013-01-07 MED ORDER — LIDOCAINE-PRILOCAINE 2.5-2.5 % EX CREA: 1.0000 "application " | TOPICAL_CREAM | CUTANEOUS | Status: DC | PRN

## 2013-01-07 MED ORDER — WARFARIN SODIUM 2 MG PO TABS
2.0000 mg | ORAL_TABLET | Freq: Once | ORAL | Status: DC
  Filled 2013-01-07: qty 1

## 2013-01-07 NOTE — Progress Notes (Signed)
ANTICOAGULATION CONSULT NOTE - Follow-up Consult  Pharmacy Consult:  Coumadin Indication: hx DVT / Afib  No Known Allergies  Patient Measurements: Height: 5\' 5"  (165.1 cm) Weight: 223 lb 8.7 oz (101.4 kg) IBW/kg (Calculated) : 57  Vital Signs: Temp: 98.5 F (36.9 C) (06/25 0420) Temp src: Oral (06/25 0420) BP: 131/59 mmHg (06/25 0800) Pulse Rate: 87 (06/25 0800)  Labs:  Recent Labs  01/05/13 0500 01/05/13 1600 01/06/13 0420 01/07/13 0430  APTT 55*  --  60*  --   LABPROT 28.7*  --  25.3* 25.0*  INR 2.88*  --  2.43* 2.39*  CREATININE 1.27* 1.26* 1.14*  --     Estimated Creatinine Clearance: 48.8 ml/min (by C-G formula based on Cr of 1.14).      Assessment: 79 yo female with CHF with HOCM presenting with anasarca and ARF in the setting of decompensated HF.  Patient also with history of DVT and Afib to continue on Coumadin therapy.  INR remains in therapeutic range.  No bleeding reported and last CBC WNL on 01/01/13.    Goal of Therapy:   INR 2-3    Plan:  - Coumadin 2mg  PO today - Daily PT / INR - Weekly CBC - F/U doxycycline LOT (today is day #10 of therapy)     Arriyana Rodell D. Laney Potash, PharmD, BCPS Pager:  [phone] 01/07/2013, 8:38 AM

## 2013-01-07 NOTE — Progress Notes (Signed)
PT Cancellation Note  Patient Details Name: [name] MRN: [id number] DOB: [birth year]   Cancelled Treatment:    Reason Eval/Treat Not Completed: Patient at procedure or test/unavailable.  Pt is not currently in room.  PT to check back tomorrow.     Rollene Rotunda Angila Wombles, PT, DPT [phone]   01/07/2013, 4:36 PM

## 2013-01-07 NOTE — Consult Note (Signed)
[facility] Consult  Reason for Consult:  ESRD Referring Physician:  Lowell Guitar  [id number]  History of Present Illness: This is a 79 y.o. female who was admitted on 12/30/12 with significant orthopnea and SOB.  She is followed by Dr. Rennis Golden for diastolic, predominant right heart failure.  She had a 20 lb weigh gain over the week prior to admission.  She was borderline hypotensive on admission.  She has a baseline elevated creatinine, but was more elevated than baseline.  She had been on bactim for LE cellulitis prescribed by her PCP.  She did have elevated troponin on admission, but it is suspected this is due to her heart failure.  She did have a CVA in January 2014.  She states that she does not have any residual from this and denies any weakness in her extremities. Her carotid duplex revealed no significant extracranial carotid stenosis.    Pt is on coumadin and she states that she has been on this for 3 months.  She states that she was originally placed on ASA and that didn't work and then plavix and that didn't work and then finally coumadin.  She does has a history of DVT in her lower extremity.  She has hx of CVA in January.  She denies any hx of irregular heart beat or Afib.    She is right handed, but had surgery on her right thumb in December of 2013 and states that her fingers tingle in her right hand now and that her right hand is now a little weaker.  Past Medical History  Diagnosis Date  . Hypertension   . Venous stasis of lower extremity WEARS TEDS    BLE--  CURRENTLY NO ULCERS/ WOUNDS  . Aortic insufficiency     mild/H&P 04/02/2012  . Type II diabetes mellitus   . OSA on CPAP   . Pulmonary hypertension per last echo 03-28-2012  mod.-severe     per dr wert note this related to L ht pressures from diastolic chf  . Hypertrophic obstructive cardiomyopathy(425.11)   . Arthritis   . CKD (chronic kidney disease) stage 3, GFR 30-59 ml/min   . Heart murmur   . History  of DVT of lower extremity lower right leg nov 2012  . Hyperlipidemia   . History of gout 06-09-2012  per pt stable  . Chronic diastolic CHF (congestive heart failure), NYHA class 3 CARDIOLOGIST- DR HILTY--  LOV OCT 2013--  REQUESTED NOTE, EGK AND STRESS TEST    PER PT ON 06-09-2012  S&S OF CHF AND WT IS DOWN  . H/O hiatal hernia   . GERD (gastroesophageal reflux disease)   . Lower extremity edema   . PAF (paroxysmal atrial fibrillation), in SR on admit 09/25/2012  . Stroke Jan 2014    Rt brain, Nl CA doppler  . TIA (transient ischemic attack) 08/07/2012    2D Echo - EF 60-65, severe concentric hypertrophy in the left ventricle  . Dyspnea on exertion 11/30/2010    Gated Stress Dipyridamole Myocardial Perfusion - post stress EF-63, normal study, no ischemia demonstrated    Past Surgical History  Procedure Laterality Date  . Total knee arthroplasty  1988; 1998 X2    right; bilateral  . Tumor excision  1970's    "off my stomach"  . Cataract extraction w/ intraocular lens implant  ~ 2010    right; "put lens in; didn't work; had mass on my eye"  . Transthoracic echocardiogram  04-03-2012   DR  KENNETH HILTY    LVSF  EF 80-85%/ HYPERTRONIC CARDIOMYOPATHY WITH MOSTLY MID-CAVITARY OBSTRUCTION AND A SMALLER COMPONENT OF LV OUTFLOW TRACT OBSTRUCTION/   EVIDENCE OF SEVERE DECOMPRESATION OF DIASTOLIC HEART FAILURE AND MODERATE TO SEVERE PULMONARY ARTERIAL HYPERTENSION  . Joint replacement  '88 and '98    TOTAL RIGHT KNEE X2  1988  &  1998 (DUE TO JOINT MALFUNCTION)   TOTAL LEFT KNEE 1998  . Carpometacarpel suspension plasty  06/16/2012    Procedure: CARPOMETACARPEL [facility]) SUSPENSION PLASTY;  Surgeon: Jodi Marble, MD;  Location: [facility];  Service: Orthopedics;  Laterality: Right;  RIGHT THUMB SUSPENSION ARTHROPLASTY.  Marland Kitchen Eye surgery Right ~ 2011    "not a cataract; just a growth" (09/26/2012)  . Cardiac catheterization Bilateral 01/19/2004    NL cors. neg Nuc 2012    No Known  Allergies  Prior to Admission medications   Medication Sig Start Date End Date Taking? Authorizing Provider  allopurinol (ZYLOPRIM) 300 MG tablet Take 150 mg by mouth daily.   Yes Historical Provider, MD  cloNIDine (CATAPRES) 0.2 MG tablet Take 0.2 mg by mouth 2 (two) times daily.  09/21/12  Yes Historical Provider, MD  furosemide (LASIX) 40 MG tablet Take 40 mg by mouth 2 (two) times daily.   Yes Historical Provider, MD  glimepiride (AMARYL) 4 MG tablet Take 4 mg by mouth daily before breakfast.    Yes Historical Provider, MD  insulin lispro protamine-insulin lispro (HUMALOG 75/25) (75-25) 100 UNIT/ML SUSP Inject 5-10 Units into the skin 2 (two) times daily with a meal. 10 units with breakfast and 5 units at dinner   Yes Historical Provider, MD  isosorbide-hydrALAZINE (BIDIL) 20-37.5 MG per tablet Take 1 tablet by mouth 3 (three) times daily.   Yes Historical Provider, MD  metolazone (ZAROXOLYN) 2.5 MG tablet 2.5 mg. Take one tab 3 times a week as needed based on weight- if weight is up 5lbs, take one tablet 11/13/12  Yes Historical Provider, MD  metoprolol succinate (TOPROL-XL) 25 MG 24 hr tablet Take 37.5 mg by mouth 2 (two) times daily.   Yes Historical Provider, MD  omeprazole (PRILOSEC) 20 MG capsule Take 20 mg by mouth every morning.    Yes Historical Provider, MD  phenytoin (DILANTIN) 300 MG ER capsule Take 1 capsule (300 mg total) by mouth at bedtime. 12/07/12  Yes Henderson Cloud, MD  potassium chloride SA (K-DUR,KLOR-CON) 20 MEQ tablet Take 20 mEq by mouth 2 (two) times daily.   Yes Historical Provider, MD  senna-docusate (SENOKOT-S) 8.6-50 MG per tablet Take 1 tablet by mouth at bedtime as needed for constipation.   Yes Historical Provider, MD  simvastatin (ZOCOR) 10 MG tablet Take 10 mg by mouth at bedtime.   Yes Historical Provider, MD  sulfamethoxazole-trimethoprim (BACTRIM DS) 800-160 MG per tablet Take 1 tablet by mouth 2 (two) times daily.   Yes Historical Provider, MD   warfarin (COUMADIN) 2.5 MG tablet Take 1.25-2.5 mg by mouth every evening. 2.5mg /day except take 1.25mg  on MF   Yes Historical Provider, MD    History   Social History  . Marital Status: Married    Spouse Name: N/A    Number of Children: 2  . Years of Education: N/A   Occupational History  . Retired    Social History Main Topics  . Smoking status: Never Smoker   . Smokeless tobacco: Never Used  . Alcohol Use: Yes  . Drug Use: No  . Sexually Active: No  Other Topics Concern  . Not on file   Social History Narrative  . No narrative on file     Family History  Problem Relation Age of Onset  . Other Mother   . Coronary artery disease Father     sudden death in his 70's  . Diabetes Father   . Cancer Father   . Diabetes Sister     ROS: [x]  Positive   [ ]  Negative   [ ]  All sytems reviewed and are negative  Cardiovascular: [x]  heart murmur []  chest pain/pressure []  palpitations [x]  SOB lying flat [x]  DOE []  pain in legs while walking []  pain in feet when lying flat [X]  hx of DVT-RLE 11/12 [x]  hx of cellulitis BLE [x]  swelling in legs []  varicose veins  Pulmonary: [x]  OSA on CPAP [x]  dry cough []  asthma []  wheezing  Neurologic: [x]  Hx CVA-no residual [x]  hx seizure 5/23-Negative CT/MRI; was felt to be secondary to prior right cortical stroke-no further occurences []  weakness in []  arms []  legs []  numbness in []  arms []  legs [] difficulty speaking or slurred speech []  temporary loss of vision in one eye []  dizziness  Hematologic: []  bleeding problems []  problems with blood clotting easily  Endocrine:   [x]  diabetes []  thyroid disease  GI: [x]  GERD []  vomiting blood []  blood in stool  GU: [x]  CKD []  burning with urination []  blood in urine  Psychiatric: []  hx of major depression  Musculoskeletal: [x]  arthritis [x]  hx of gout  Integumentary: []  rashes []  ulcers  Constitutional: []  fever []  chills   Physical  Examination  Filed Vitals:   01/07/13 1400  BP: 104/72  Pulse: 86  Temp:   Resp: 16   Body mass index is 37.2 kg/(m^2).  General:  WDWN, obese in NAD Gait: Not observed HENT: WNL, normocephalic Pulmonary: normal non-labored breathing , without Rales, rhonchi,  wheezing Cardiac: RRR, + murmur, rubs or gallops; without carotid bruits on the left side-she has a temporary catheter in the right IJ and cannot access the right Abdomen: soft, NT, no masses Skin: without rashes, without ulcers  Vascular Exam/Pulses:+ palpable bilateral radial pulses; I can palpate her left femoral pulse, but cannot palpate the right. Extremities: bilateral lower extremities wrapped Musculoskeletal: no muscle wasting or atrophy  Neurologic: A&O X 3; Appropriate Affect ; SENSATION: normal; MOTOR FUNCTION:  moving all extremities equally. Speech is fluent/normal; her right grip is slightly less than the left   CBC    Component Value Date/Time   WBC 10.4 01/01/2013 0345   RBC 4.21 01/01/2013 0345   HGB 12.8 01/01/2013 0345   HCT 39.5 01/01/2013 0345   PLT 217 01/01/2013 0345   MCV 93.8 01/01/2013 0345   MCH 30.4 01/01/2013 0345   MCHC 32.4 01/01/2013 0345   RDW 16.6* 01/01/2013 0345   LYMPHSABS 1.3 12/30/2012 1652   MONOABS 0.7 12/30/2012 1652   EOSABS 0.2 12/30/2012 1652   BASOSABS 0.0 12/30/2012 1652    BMET    Component Value Date/Time   NA 137 01/06/2013 0420   K 4.5 01/06/2013 0420   CL 102 01/06/2013 0420   CO2 27 01/06/2013 0420   GLUCOSE 106* 01/06/2013 0420   BUN 18 01/06/2013 0420   CREATININE 1.14* 01/06/2013 0420   CREATININE 1.87* 11/26/2012 1200   CALCIUM 8.6 01/06/2013 0420   GFRNONAA 45* 01/06/2013 0420   GFRAA 52* 01/06/2013 0420     Non-Invasive Vascular Imaging:  Vein mapping ordered today   ASSESSMENT/PLAN: This is a  79 y.o. female with acute on chronic kidney disease.    -pt is on coumadin and it will need to be discontinued for diatek placement.   -Pt may be able to be scheduled for  Friday depending on pt's INR level.  It will need to be 1.6 or less.  Her INR today is 2.43.  Will order coumadin be held. -pt is right handed.  She does not have any IV's in her upper extremities as they have been removed.   Doreatha Massed, PA-C [facility] [phone] Agree with above Discuss situation with Dr. Lowell Guitar and will plan to wait until Monday when INR should be normal and proceed with hemodialysis catheter and AV fistula We'll review vein mapping in a.m. Procedure will be done on Monday per Dr. early

## 2013-01-07 NOTE — Progress Notes (Signed)
Foley removed per protocol. No urinary output for 6 hours. Pt is on dialysis, and is oliguric. Bladder scan performed, results less than 22 mL. Will continue to monitor and assess

## 2013-01-07 NOTE — Progress Notes (Signed)
01/07/13 0140  BiPAP/CPAP/SIPAP  BiPAP/CPAP/SIPAP Pt Type Adult  Mask Type Nasal mask  Mask Size Medium  Respiratory Rate 18 breaths/min  IPAP 20 cmH20 (Max IPAP= 20)  EPAP 5 cmH2O (Min EPAP=5)  Flow Rate 3 lpm  BiPAP/CPAP/SIPAP CPAP  Patient Home Equipment No  Auto Titrate Yes  BiPAP/CPAP /SiPAP Vitals  Pulse Rate 90  Resp 18  SpO2 100 %  Patient placed on CPAP with Nasal Mask on Auto Mode with Max IPAP 20 and Min EPAP 5. She tolerates it very well at this time.

## 2013-01-07 NOTE — Progress Notes (Signed)
Subjective: Slept well.  No SOB, CP, dizziness  Objective: Vital signs in last 24 hours: Temp:  [97.8 F (36.6 C)-98.6 F (37 C)] 98.5 F (36.9 C) (06/25 0420) Pulse Rate:  [30-115] 86 (06/25 0700) Resp:  [15-37] 24 (06/25 0700) BP: (90-150)/(34-92) 119/65 mmHg (06/25 0700) SpO2:  [92 %-100 %] 100 % (06/25 0700) Weight:  [223 lb 8.7 oz (101.4 kg)-235 lb 10.8 oz (106.9 kg)] 223 lb 8.7 oz (101.4 kg) (06/25 0500) Last BM Date: 01/06/13  Intake/Output from previous day: 06/24 0701 - 06/25 0700 In: 540 [P.O.:540] Out: 4089 [Urine:85] Intake/Output this shift:    Medications Current Facility-Administered Medications  Medication Dose Route Frequency Provider Last Rate Last Dose  . 0.9 %  sodium chloride infusion  250 mL Intravenous PRN Brittainy Simmons, PA-C 10 mL/hr at 12/31/12 2100 250 mL at 12/31/12 2100  . 0.9 %  sodium chloride infusion  100 mL Intravenous PRN Lauris Poag, MD      . 0.9 %  sodium chloride infusion  100 mL Intravenous PRN Lauris Poag, MD      . acetaminophen (TYLENOL) tablet 650 mg  650 mg Oral Q4H PRN Brittainy Simmons, PA-C      . allopurinol (ZYLOPRIM) tablet 150 mg  150 mg Oral Daily Brittainy Simmons, PA-C   150 mg at 01/06/13 0956  . ALPRAZolam Prudy Feeler) tablet 0.25 mg  0.25 mg Oral BID PRN Brittainy Simmons, PA-C      . antiseptic oral rinse (BIOTENE) solution 15 mL  15 mL Mouth Rinse BID Chrystie Nose, MD   15 mL at 01/06/13 2000  . camphor-menthol (SARNA) lotion   Topical PRN Zigmund Gottron, MD      . doxycycline (VIBRA-TABS) tablet 100 mg  100 mg Oral Q12H Sadie Haber, MD   100 mg at 01/06/13 2124  . feeding supplement (ENSURE COMPLETE) liquid 237 mL  237 mL Oral Q24H Tonye Becket, RD   237 mL at 01/05/13 1030  . feeding supplement (NEPRO CARB STEADY) liquid 237 mL  237 mL Oral PRN Lauris Poag, MD      . feeding supplement (RESOURCE BREEZE) liquid 1 Container  1 Container Oral Q24H Chrystie Nose, MD   1 Container at  01/06/13 1500  . heparin injection 1,000 Units  1,000 Units Dialysis PRN Lauris Poag, MD   2,800 Units at 01/06/13 1532  . heparin injection 2,200 Units  20 Units/kg Dialysis PRN Lauris Poag, MD   2,200 Units at 01/06/13 1152  . insulin aspart (novoLOG) injection 0-9 Units  0-9 Units Subcutaneous TID WC Chrystie Nose, MD   2 Units at 01/06/13 1752  . insulin aspart protamine- aspart (NOVOLOG 70/30) injection 10 Units  10 Units Subcutaneous Q breakfast Chrystie Nose, MD   10 Units at 01/06/13 [id number]  . insulin aspart protamine- aspart (NOVOLOG 70/30) injection 5 Units  5 Units Subcutaneous Q supper Chrystie Nose, MD   5 Units at 01/06/13 1752  . lidocaine (PF) (XYLOCAINE) 1 % injection 5 mL  5 mL Intradermal PRN Lauris Poag, MD      . lidocaine-prilocaine (EMLA) cream 1 application  1 application Topical PRN Lauris Poag, MD      . ondansetron [facility]) injection 4 mg  4 mg Intravenous Q6H PRN Brittainy Simmons, PA-C      . pantoprazole (PROTONIX) EC tablet 40 mg  40 mg Oral Daily Brittainy Simmons, PA-C   40 mg  at 01/06/13 0956  . pentafluoroprop-tetrafluoroeth (GEBAUERS) aerosol 1 application  1 application Topical PRN Lauris Poag, MD      . phenytoin (DILANTIN) ER capsule 300 mg  300 mg Oral QHS Brittainy Simmons, PA-C   300 mg at 01/06/13 2124  . senna-docusate (Senokot-S) tablet 1 tablet  1 tablet Oral QHS PRN Robbie Lis, PA-C   1 tablet at 01/05/13 0958  . simvastatin (ZOCOR) tablet 10 mg  10 mg Oral QHS Brittainy Simmons, PA-C   10 mg at 01/06/13 2124  . sodium chloride 0.9 % injection 3 mL  3 mL Intravenous Q12H Brittainy Simmons, PA-C   3 mL at 01/06/13 2200  . sodium chloride 0.9 % injection 3 mL  3 mL Intravenous PRN Brittainy Simmons, PA-C      . Warfarin - Pharmacist Dosing Inpatient   Does not apply q1800 Drake Leach Rumbarger, RPH      . zolpidem (AMBIEN) tablet 5 mg  5 mg Oral QHS PRN Robbie Lis, PA-C        PE: General appearance: alert,  cooperative and no distress Lungs: clear to auscultation bilaterally Heart: Reg/Irreg, 2/6 sys MM Abdomen: soft, non-tender; bowel sounds normal; no masses,  no organomegaly Extremities: 1-2+ LEE up through posterior thighs. Pulses: 2+ and symmetric Neurologic: Grossly normal  Lab Results:  No results found for this basename: WBC, HGB, HCT, PLT,  in the last 72 hours BMET  Recent Labs  01/05/13 0500 01/05/13 1600 01/06/13 0420  NA 136 135 137  K 4.7 4.6 4.5  CL 103 101 102  CO2 27 27 27   GLUCOSE 101* 102* 106*  BUN 19 19 18   CREATININE 1.27* 1.26* 1.14*  CALCIUM 8.1* 8.5 8.6   PT/INR  Recent Labs  01/05/13 0500 01/06/13 0420 01/07/13 0430  LABPROT 28.7* 25.3* 25.0*  INR 2.88* 2.43* 2.39*   Cholesterol No results found for this basename: CHOL,  in the last 72 hours Cardiac Enzymes No components found with this basename: TROPONIN,  CKMB,  BNP (last 3 results)  Recent Labs  09/30/12 0535 12/30/12 1652 01/01/13 0345  PROBNP 11933.0* 22049.0* 21019.0*   Studies/Results: @RISRSLT2 @   Assessment/Plan   Principal Problem:   Acute on chronic diastolic CHF (congestive heart failure), NYHA class 3, 09/25/12 Active Problems:   Sleep apnea on c-pap    Aortic insufficiency: mild   Chronic venous insufficiency with LE ulcers, will be followed at wound center   Diabetes mellitus type 2 in obese   Hypertrophic obstructive cardiomyopathy(425.11)   CKD (chronic kidney disease) stage 3, GFR 30-59 ml/min   History of Rt brain CVA   PAF (paroxysmal atrial fibrillation),    Long term (current) use of anticoagulants   Cardiorenal syndrome with renal failure   Cardiogenic shock  Plan:  Net fluids: -3.5L(64ml from urine)/-15.1L(adm).  BP stable.  NSR at 88 bpm.  INR therapeutic.  Need to get physical/occu therapy involved.  She has not been out of bed.  IHD yesterday.   LOS: 8 days    HAGER, BRYAN 01/07/2013 7:45 AM   Patient seen and examined. Agree with  assessment and plan. Clinically she feels better. BP now 130/60, P 88.  With HOCM and cavitary obliteration will try adding back low dose beta-blocker and if tolerates ? Verapamil.  f/u ECG  and BNP, was [id number] on 6/19.   Lennette Bihari, MD, [facility] 01/07/2013 8:50 AM

## 2013-01-07 NOTE — Progress Notes (Signed)
Placed pt. On cpap as per order. Pt. Is tolerating well at this time.Rt to monitor.

## 2013-01-07 NOTE — Progress Notes (Signed)
Second hemodialysis Tx. Ran for 4hrs without problems. Pulled off 3.5L of fluid. Tolerated well.

## 2013-01-07 NOTE — Progress Notes (Signed)
ASSESSMENT/RECOMMENDATIONS:  [name] with DM2, history of CVA, Sz disorder, prior hypertension, baseline CKD3 with baseline creatinine 1.5-1.7 as recently as 09/2012 (never worked up) and diastolic/right sided CHF with HOCM who presents with anasarca and acute renal failure on chronic in the setting of decompensated CHF, hypotension, refractory to IV diuretics.   Acute kidney injury on chronic with significant cardiorenal syndrome and massive diffuse anasarca-improving . Remains oliguric   Plan: will do another HD treatment today and plan for perm cath on Friday (may be an issue with warfarin)> WIll ask VVS to see and also eval for AV access.  Vein map.  Subjective: Interval History: Tolerated dialysis yesterday with 4000 cc of fluid removed and improved breathing  Objective: Vital signs in last 24 hours: Temp:  [97.8 F (36.6 C)-98.6 F (37 C)] 98.6 F (37 C) (06/25 0800) Pulse Rate:  [30-115] 87 (06/25 0800) Resp:  [15-37] 22 (06/25 0800) BP: (90-150)/(34-92) 131/59 mmHg (06/25 0800) SpO2:  [92 %-100 %] 100 % (06/25 0800) Weight:  [101.4 kg (223 lb 8.7 oz)-106.9 kg (235 lb 10.8 oz)] 101.4 kg (223 lb 8.7 oz) (06/25 0500) Weight change: 1 kg (2 lb 3.3 oz)  Intake/Output from previous day: 06/24 0701 - 06/25 0700 In: 540 [P.O.:540] Out: 4089 [Urine:85] Intake/Output this shift:   General appearance: alert, cooperative and more comfortable today Resp: coarse BS bilat Chest wall: no tenderness Cardio: regular rate and rhythm, S1, S2 normal, no murmur, click, rub or gallop and harsh  2-3/6 SEM Extremities: edema improving now 1-2+  Lab Results: No results found for this basename: WBC, HGB, HCT, PLT,  in the last 72 hours BMET:  Recent Labs  01/05/13 1600 01/06/13 0420  NA 135 137  K 4.6 4.5  CL 101 102  CO2 27 27  GLUCOSE 102* 106*  BUN 19 18  CREATININE 1.26* 1.14*  CALCIUM 8.5 8.6   No results found for this basename: PTH,  in the last 72 hours Iron Studies: No  results found for this basename: IRON, TIBC, TRANSFERRIN, FERRITIN,  in the last 72 hours Studies/Results: No results found.  Scheduled: . allopurinol  150 mg Oral Daily  . antiseptic oral rinse  15 mL Mouth Rinse BID  . carvedilol  3.125 mg Oral BID WC  . doxycycline  100 mg Oral Q12H  . feeding supplement  237 mL Oral Q24H  . feeding supplement  1 Container Oral Q24H  . insulin aspart  0-9 Units Subcutaneous TID WC  . insulin aspart protamine- aspart  10 Units Subcutaneous Q breakfast  . insulin aspart protamine- aspart  5 Units Subcutaneous Q supper  . pantoprazole  40 mg Oral Daily  . phenytoin  300 mg Oral QHS  . simvastatin  10 mg Oral QHS  . sodium chloride  3 mL Intravenous Q12H  . warfarin  2 mg Oral ONCE-1800  . Warfarin - Pharmacist Dosing Inpatient   Does not apply q1800     LOS: 8 days   Dunia Pringle C 01/07/2013,9:54 AM

## 2013-01-08 DIAGNOSIS — N184 Chronic kidney disease, stage 4 (severe): Secondary | ICD-10-CM

## 2013-01-08 DIAGNOSIS — I359 Nonrheumatic aortic valve disorder, unspecified: Secondary | ICD-10-CM

## 2013-01-08 DIAGNOSIS — Z7901 Long term (current) use of anticoagulants: Secondary | ICD-10-CM

## 2013-01-08 LAB — PROTIME-INR: INR: 2.55 — ABNORMAL HIGH (ref 0.00–1.49)

## 2013-01-08 LAB — BASIC METABOLIC PANEL
BUN: 15 mg/dL (ref 6–23)
Calcium: 8.3 mg/dL — ABNORMAL LOW (ref 8.4–10.5)
Creatinine, Ser: 1.32 mg/dL — ABNORMAL HIGH (ref 0.50–1.10)
GFR calc Af Amer: 44 mL/min — ABNORMAL LOW (ref >90)
GFR calc non Af Amer: 38 mL/min — ABNORMAL LOW (ref >90)
Potassium: 3.8 mEq/L (ref 3.5–5.1)

## 2013-01-08 LAB — CBC
HCT: 36.5 % (ref 36.0–46.0)
MCH: 30.3 pg (ref 26.0–34.0)
MCHC: 31.5 g/dL (ref 30.0–36.0)
MCV: 96.3 fL (ref 78.0–100.0)
RDW: 17 % — ABNORMAL HIGH (ref 11.5–15.5)

## 2013-01-08 LAB — GLUCOSE, CAPILLARY
Glucose-Capillary: 118 mg/dL — ABNORMAL HIGH (ref 70–99)
Glucose-Capillary: 125 mg/dL — ABNORMAL HIGH (ref 70–99)

## 2013-01-08 MED ORDER — RENA-VITE PO TABS
1.0000 | ORAL_TABLET | Freq: Every day | ORAL | Status: DC
  Administered 2013-01-08 – 2013-01-16 (×7): 1 via ORAL
  Filled 2013-01-08 (×10): qty 1

## 2013-01-08 MED ORDER — WARFARIN SODIUM 2 MG PO TABS
2.0000 mg | ORAL_TABLET | Freq: Once | ORAL | Status: AC
  Administered 2013-01-08: 2 mg via ORAL
  Filled 2013-01-08: qty 1

## 2013-01-08 NOTE — Progress Notes (Signed)
ASSESSMENT/RECOMMENDATIONS:  [name] with DM2, history of CVA, Sz disorder, prior hypertension, baseline CKD3 with baseline creatinine 1.5-1.7 as recently as 09/2012 (never worked up) and diastolic/right sided CHF with HOCM who presents with anasarca and acute renal failure on chronic in the setting of decompensated CHF, hypotension, refractory to IV diuretics.  Acute kidney injury on chronic with significant cardiorenal syndrome and massive diffuse anasarca-improving . Remains oliguric .    I believe she is dialysis dependent; therefore ESRD  Plan: will ask VVS to place both Permcath and AV access at same time.  This can be done next week(w/ lower INR).  Begin intermittent HD Txs.  Next HD Friday.    Subjective: Interval History: 3500cc off with dialysis yesterday. She feels better. Breathing better.  Agreeable to long term dialysis "to live".    Weight  Was 115.1kg on 6/21, wgt 98.7Kg on 9/26 Objective: Vital signs in last 24 hours: Temp:  [97.8 F (36.6 C)-98.6 F (37 C)] 98.6 F (37 C) (06/26 0731) Pulse Rate:  [80-99] 80 (06/26 0600) Resp:  [16-36] 17 (06/26 0600) BP: (90-131)/(30-108) 116/73 mmHg (06/26 0600) SpO2:  [96 %-100 %] 100 % (06/26 0600) Weight:  [97.7 kg (215 lb 6.2 oz)-101.3 kg (223 lb 5.2 oz)] 98.7 kg (217 lb 9.5 oz) (06/26 0450) Weight change: -5.6 kg (-12 lb 5.5 oz)  Intake/Output from previous day: 06/25 0701 - 06/26 0700 In: 840 [P.O.:840] Out: 3500  Intake/Output this shift:    General appearance: alert and cooperative Chest wall: no tenderness Cardio: regular rate and rhythm, S1, S2 normal, no murmur, click, rub or gallop GI: soft, non-tender; bowel sounds normal; no masses,  no organomegaly Extremities: wrinkles now, unna boots on  Lab Results:  Recent Labs  01/08/13 0445  WBC 13.3*  HGB 11.5*  HCT 36.5  PLT 138*   BMET:  Recent Labs  01/06/13 0420 01/08/13 0445  NA 137 139  K 4.5 3.8  CL 102 102  CO2 27 30  GLUCOSE 106* 107*  BUN 18  15  CREATININE 1.14* 1.32*  CALCIUM 8.6 8.3*   No results found for this basename: PTH,  in the last 72 hours Iron Studies: No results found for this basename: IRON, TIBC, TRANSFERRIN, FERRITIN,  in the last 72 hours Studies/Results: No results found.  Scheduled: . allopurinol  150 mg Oral Daily  . antiseptic oral rinse  15 mL Mouth Rinse BID  . carvedilol  3.125 mg Oral BID WC  . doxycycline  100 mg Oral Q12H  . feeding supplement  237 mL Oral Q24H  . feeding supplement  1 Container Oral Q24H  . insulin aspart  0-9 Units Subcutaneous TID WC  . insulin aspart protamine- aspart  10 Units Subcutaneous Q breakfast  . insulin aspart protamine- aspart  5 Units Subcutaneous Q supper  . pantoprazole  40 mg Oral Daily  . phenytoin  300 mg Oral QHS  . simvastatin  10 mg Oral QHS  . sodium chloride  3 mL Intravenous Q12H  . Warfarin - Pharmacist Dosing Inpatient   Does not apply q1800     LOS: 9 days   Shelbylynn Walczyk C 01/08/2013,7:49 AM

## 2013-01-08 NOTE — Progress Notes (Signed)
Subjective: No complaints  Objective: Vital signs in last 24 hours: Temp:  [97.8 F (36.6 C)-98.6 F (37 C)] 98.6 F (37 C) (06/26 0731) Pulse Rate:  [80-99] 80 (06/26 0600) Resp:  [16-36] 17 (06/26 0600) BP: (90-131)/(30-108) 116/73 mmHg (06/26 0600) SpO2:  [96 %-100 %] 100 % (06/26 0600) Weight:  [215 lb 6.2 oz (97.7 kg)-223 lb 5.2 oz (101.3 kg)] 217 lb 9.5 oz (98.7 kg) (06/26 0450) Last BM Date: 01/06/13  Intake/Output from previous day: 06/25 0701 - 06/26 0700 In: 840 [P.O.:840] Out: 3500  Intake/Output this shift:    Medications Current Facility-Administered Medications  Medication Dose Route Frequency Provider Last Rate Last Dose  . 0.9 %  sodium chloride infusion  250 mL Intravenous PRN Brittainy Simmons, PA-C 10 mL/hr at 12/31/12 2100 250 mL at 12/31/12 2100  . acetaminophen (TYLENOL) tablet 650 mg  650 mg Oral Q4H PRN Brittainy Simmons, PA-C      . allopurinol (ZYLOPRIM) tablet 150 mg  150 mg Oral Daily Brittainy Simmons, PA-C   150 mg at 01/07/13 1054  . ALPRAZolam Prudy Feeler) tablet 0.25 mg  0.25 mg Oral BID PRN Brittainy Simmons, PA-C      . antiseptic oral rinse (BIOTENE) solution 15 mL  15 mL Mouth Rinse BID Chrystie Nose, MD   15 mL at 01/07/13 0800  . camphor-menthol (SARNA) lotion   Topical PRN Zigmund Gottron, MD      . carvedilol (COREG) tablet 3.125 mg  3.125 mg Oral BID WC Lennette Bihari, MD   3.125 mg at 01/07/13 2121  . doxycycline (VIBRA-TABS) tablet 100 mg  100 mg Oral Q12H Sadie Haber, MD   100 mg at 01/07/13 2122  . feeding supplement (ENSURE COMPLETE) liquid 237 mL  237 mL Oral Q24H Tonye Becket, RD   237 mL at 01/07/13 1054  . feeding supplement (RESOURCE BREEZE) liquid 1 Container  1 Container Oral Q24H Chrystie Nose, MD   1 Container at 01/07/13 2124  . insulin aspart (novoLOG) injection 0-9 Units  0-9 Units Subcutaneous TID WC Chrystie Nose, MD   1 Units at 01/07/13 1244  . insulin aspart protamine- aspart (NOVOLOG 70/30)  injection 10 Units  10 Units Subcutaneous Q breakfast Chrystie Nose, MD   10 Units at 01/07/13 [phone]  . insulin aspart protamine- aspart (NOVOLOG 70/30) injection 5 Units  5 Units Subcutaneous Q supper Chrystie Nose, MD   5 Units at 01/06/13 1752  . multivitamin (RENA-VIT) tablet 1 tablet  1 tablet Oral Daily Lauris Poag, MD      . ondansetron [facility]) injection 4 mg  4 mg Intravenous Q6H PRN Brittainy Simmons, PA-C      . pantoprazole (PROTONIX) EC tablet 40 mg  40 mg Oral Daily Brittainy Simmons, PA-C   40 mg at 01/07/13 1054  . phenytoin (DILANTIN) ER capsule 300 mg  300 mg Oral QHS Brittainy Simmons, PA-C   300 mg at 01/07/13 2122  . senna-docusate (Senokot-S) tablet 1 tablet  1 tablet Oral QHS PRN Robbie Lis, PA-C   1 tablet at 01/05/13 0958  . simvastatin (ZOCOR) tablet 10 mg  10 mg Oral QHS Brittainy Simmons, PA-C   10 mg at 01/07/13 2122  . sodium chloride 0.9 % injection 3 mL  3 mL Intravenous Q12H Brittainy Simmons, PA-C   3 mL at 01/07/13 2122  . sodium chloride 0.9 % injection 3 mL  3 mL Intravenous PRN Brittainy Simmons, PA-C      .  Warfarin - Pharmacist Dosing Inpatient   Does not apply q1800 Drake Leach Rumbarger, RPH      . zolpidem (AMBIEN) tablet 5 mg  5 mg Oral QHS PRN Robbie Lis, PA-C        PE: General appearance: alert, cooperative and no distress  Lungs: decreased BS bilaterally  Heart: RRR, 2/6 sys MM   Extremities: 1-2+ LEE up through posterior thighs.  Pulses: 2+ and symmetric  Neurologic: Grossly normal   Lab Results:   Recent Labs  01/08/13 0445  WBC 13.3*  HGB 11.5*  HCT 36.5  PLT 138*   BMET  Recent Labs  01/05/13 1600 01/06/13 0420 01/08/13 0445  NA 135 137 139  K 4.6 4.5 3.8  CL 101 102 102  CO2 27 27 30   GLUCOSE 102* 106* 107*  BUN 19 18 15   CREATININE 1.26* 1.14* 1.32*  CALCIUM 8.5 8.6 8.3*   PT/INR  Recent Labs  01/06/13 0420 01/07/13 0430 01/08/13 0445  LABPROT 25.3* 25.0* 26.6*  INR 2.43* 2.39* 2.55*     Cholesterol No results found for this basename: CHOL,  in the last 72 hours Cardiac Enzymes No components found with this basename: TROPONIN,  CKMB,   Studies/Results: @RISRSLT2 @   Assessment/Plan  Principal Problem:   Acute on chronic diastolic CHF (congestive heart failure), NYHA class 3, 09/25/12 Active Problems:   Sleep apnea on c-pap    Aortic insufficiency: mild   Chronic venous insufficiency with LE ulcers, will be followed at wound center   Diabetes mellitus type 2 in obese   Hypertrophic obstructive cardiomyopathy(425.11)   CKD (chronic kidney disease) stage 3, GFR 30-59 ml/min   History of Rt brain CVA   PAF (paroxysmal atrial fibrillation),    Long term (current) use of anticoagulants   Cardiorenal syndrome with renal failure   Cardiogenic shock  Plan:  Net fluids: -2.6L/-17.8L.  BP a little soft and HR stable. Permacath and AV access to be placed a week from tomorrow.  Will need to hold coumadin and start heparin soon.  30 beat NSVT this morning.  Not much room to adjust Coreg.  She had the first dose last night.  Will continue to monitor and titrate as we can.  PT/OT seeing pt now.  Will transfer to [facility] today.     LOS: 9 days    Dmiya Malphrus 01/08/2013 8:01 AM

## 2013-01-08 NOTE — Progress Notes (Signed)
Pt had 27-beat run of VT, pt asymptomatic, VSS, MD paged and notified. No new orders received, will continue to monitor closely.

## 2013-01-08 NOTE — Progress Notes (Signed)
ANTICOAGULATION CONSULT NOTE - Follow-up Consult  Pharmacy Consult:  Coumadin Indication: hx DVT / Afib  No Known Allergies  Patient Measurements: Height: 5\' 5"  (165.1 cm) Weight: 217 lb 9.5 oz (98.7 kg) IBW/kg (Calculated) : 57  Vital Signs: Temp: 98.6 F (37 C) (06/26 0731) Temp src: Oral (06/26 0731) BP: 104/55 mmHg (06/26 0814) Pulse Rate: 82 (06/26 0800)  Labs:  Recent Labs  01/05/13 1600 01/06/13 0420 01/07/13 0430 01/08/13 0445  HGB  --   --   --  11.5*  HCT  --   --   --  36.5  PLT  --   --   --  138*  APTT  --  60*  --   --   LABPROT  --  25.3* 25.0* 26.6*  INR  --  2.43* 2.39* 2.55*  CREATININE 1.26* 1.14*  --  1.32*   Estimated Creatinine Clearance: 41.5 ml/min (by C-G formula based on Cr of 1.32).  Assessment: 79 yo female with CHF with HOCM presenting with anasarca and ARF in the setting of decompensated HF.  Patient also with history of DVT and Afib to continue on Coumadin therapy.  INR remains in therapeutic range.  No bleeding reported and today's CBC WNL.  HOME dose:  She takes 2.5 mg daily except 1.25 mg on Monday and Friday  Drug/Drug interactions:  Hypoprothrombinemic effects of warfarin may be increased by doxycycline and Xanthine Oxidase inhibitors (Allopurinol)   Goal of Therapy:   INR 2-3   Plan:  - Coumadin 2mg  PO today - Daily PT / INR - Weekly CBC - F/U doxycycline LOT (today is day #11 of therapy)  Nadara Mustard, PharmD., MS Clinical Pharmacist Pager:  [phone] Thank you for allowing pharmacy to be part of this patients care team. 01/08/2013, 9:35 AM

## 2013-01-08 NOTE — Progress Notes (Signed)
I have seen and evaluated the patient this AM along with Wilburt Finlay, PA. I agree with his findings, examination as well as impression recommendations.  Continues to be oliguric despite nearly normal Cr (falsely lowerd by HD). But clinically appears to be "better" Able to take off a more significant amount off fluid now.    Prolonged run of NSVT o/n -- would love to get her on a stable BB dose (had a dose held for hypotension -- I would hope that with continued RV afterload reduction with volume removal, the LV would pick up CO enough to keep BP stable  if unable to tolerate BB, may need to consider Amiodarone -- which would also provide potential rhythm control for Afib, as she definitely would not tolerate Afib.    Verapamil may be an option, but hypotension would still be a concern.  ? Switching to Beta 1 selective BB - metoprolol or bisoprolol which have less BP effect.  Will need to have anticoagulation interrupted for HD catheter prior to d/c.  Continue dosing for now due to PAF.  Remains moderately ill, but has improved enough to consider transfer to TCU.  Allopurinol for gout.  Glycemic control is stable on current regimen - 70/30 + SSI.    MD Time with pt: 10 min  Azriella Mattia W, M.D., M.S. [facility] [address] [place], [place]  [address]  [phone] Pager # [phone] 01/08/2013 11:12 AM

## 2013-01-08 NOTE — Evaluation (Signed)
Physical Therapy Evaluation Patient Details Name: [name] MRN: [id number] DOB: 11/05/1933 Today's Date: 01/08/2013 Time: 0811-0840 PT Time Calculation (min): 29 min  PT Assessment / Plan / Recommendation History of Present Illness  SOB, gain +20 lb, CHF, Acute Renal Failure  Clinical Impression  Pt will benefit from acute PT services to improve overall mobility and prepare for safe d/c to next venue.     PT Assessment  Patient needs continued PT services    Follow Up Recommendations  SNF    Does the patient have the potential to tolerate intense rehabilitation      Barriers to Discharge        Equipment Recommendations  None recommended by PT    Recommendations for Other Services     Frequency Min 3X/week    Precautions / Restrictions Precautions Precautions: Fall Restrictions Weight Bearing Restrictions: No   Pertinent Vitals/Pain No c/o pain; vital maintained throughout      Mobility  Bed Mobility Bed Mobility: Supine to Sit Supine to Sit: 4: Min guard Details for Bed Mobility Assistance: Minguard for safety with cues for proper technique Transfers Transfers: Sit to Stand;Stand to Sit Sit to Stand: 1: +2 Total assist;From bed Sit to Stand: Patient Percentage: 60% Stand to Sit: 1: +2 Total assist;To chair/3-in-1 Stand to Sit: Patient Percentage: 60% Stand Pivot Transfers: 1: +2 Total assist;From elevated surface Stand Pivot Transfers: Patient Percentage: 60% Details for Transfer Assistance: +2 (A) for transfers with cues for hand and LE placement and upright posture.  Pt needed (A) to advance LE into chair.  Pt unable to stand completely upright. Ambulation/Gait Ambulation/Gait Assistance: Not tested (comment)    Exercises     PT Diagnosis: Difficulty walking;Generalized weakness  PT Problem List: Decreased strength;Decreased range of motion;Decreased activity tolerance;Decreased balance;Decreased mobility;Decreased coordination;Decreased knowledge of  use of DME PT Treatment Interventions: DME instruction;Gait training;Functional mobility training;Therapeutic activities;Therapeutic exercise;Balance training;Patient/family education     PT Goals(Current goals can be found in the care plan section) Acute Rehab PT Goals Patient Stated Goal: To go to rehab to get stronger before I go home PT Goal Formulation: With patient Potential to Achieve Goals: Good  Visit Information  Last PT Received On: 01/08/13 Assistance Needed: +2 History of Present Illness: SOB, gain +20 lb, CHF, Acute Renal Failure       Prior Functioning  Home Living Family/patient expects to be discharged to:: Private residence Living Arrangements: Spouse/significant other;Children Available Help at Discharge: Neighbor;Family Type of Home: House Home Access: Ramped entrance Home Layout: One level Home Equipment: Environmental consultant - 2 wheels;Cane - single point Prior Function Level of Independence: Independent with assistive device(s) (With RW vs Cane depending on the day) Communication Communication: No difficulties Dominant Hand: Right    Cognition  Cognition Arousal/Alertness: Awake/alert Behavior During Therapy: WFL for tasks assessed/performed Overall Cognitive Status: Within Functional Limits for tasks assessed    Extremity/Trunk Assessment Lower Extremity Assessment Lower Extremity Assessment: RLE deficits/detail;Generalized weakness RLE Deficits / Details: Pt with limited right knee AROM flexion ~ 90 degrees.  Making sit <> stand difficult from low surfaces   Balance Balance Balance Assessed: Yes Static Sitting Balance Static Sitting - Balance Support: Feet supported Static Sitting - Level of Assistance: 5: Stand by assistance  End of Session PT - End of Session Equipment Utilized During Treatment: Gait belt Activity Tolerance: Patient limited by fatigue Patient left: in chair;with call bell/phone within reach Nurse Communication: Mobility status  GP      Katia Hannen 01/08/2013, 9:30 AM  [place], [place] DPT [phone]

## 2013-01-08 NOTE — Progress Notes (Signed)
Placed pt. On cpap as per order. Pt. Tolerating well at this time. Rt to monitor.

## 2013-01-09 DIAGNOSIS — Z01810 Encounter for preprocedural cardiovascular examination: Secondary | ICD-10-CM

## 2013-01-09 LAB — PROTIME-INR
INR: 2.34 — ABNORMAL HIGH (ref 0.00–1.49)
Prothrombin Time: 24.9 seconds — ABNORMAL HIGH (ref 11.6–15.2)

## 2013-01-09 LAB — GLUCOSE, CAPILLARY
Glucose-Capillary: 68 mg/dL — ABNORMAL LOW (ref 70–99)
Glucose-Capillary: 110 mg/dL — ABNORMAL HIGH (ref 70–99)

## 2013-01-09 MED ORDER — SODIUM CHLORIDE 0.9 % IV SOLN: 100.0000 mL | INTRAVENOUS | Status: DC | PRN

## 2013-01-09 MED ORDER — HEPARIN SODIUM (PORCINE) 1000 UNIT/ML IJ SOLN
1400.0000 [IU] | Freq: Once | INTRAMUSCULAR | Status: AC
  Administered 2013-01-09: 1400 [IU] via INTRAVENOUS

## 2013-01-09 MED ORDER — DEXTROSE 5 % IV SOLN
1.5000 g | INTRAVENOUS | Status: AC
  Administered 2013-01-12: 1.5 g via INTRAVENOUS
  Filled 2013-01-09: qty 1.5

## 2013-01-09 MED ORDER — PENTAFLUOROPROP-TETRAFLUOROETH EX AERO: 1.0000 "application " | INHALATION_SPRAY | CUTANEOUS | Status: DC | PRN

## 2013-01-09 MED ORDER — LIDOCAINE HCL (PF) 1 % IJ SOLN: 5.0000 mL | INTRAMUSCULAR | Status: DC | PRN

## 2013-01-09 MED ORDER — NEPRO/CARBSTEADY PO LIQD: 237.0000 mL | ORAL | Status: DC | PRN

## 2013-01-09 MED ORDER — HEPARIN SODIUM (PORCINE) 1000 UNIT/ML DIALYSIS: 1000.0000 [IU] | INTRAMUSCULAR | Status: DC | PRN

## 2013-01-09 MED ORDER — DEXTROSE 5 % IV SOLN: 1.5000 g | INTRAVENOUS | Status: DC

## 2013-01-09 MED ORDER — ALTEPLASE 2 MG IJ SOLR
2.0000 mg | Freq: Once | INTRAMUSCULAR | Status: DC | PRN
  Filled 2013-01-09: qty 2

## 2013-01-09 MED ORDER — LIDOCAINE-PRILOCAINE 2.5-2.5 % EX CREA: 1.0000 "application " | TOPICAL_CREAM | CUTANEOUS | Status: DC | PRN

## 2013-01-09 NOTE — Progress Notes (Addendum)
Clinical Social Work Department CLINICAL SOCIAL WORK PLACEMENT NOTE 01/09/2013  Patient:  [name], [name]  Account Number:  [id number] Admit date:  12/30/2012  Clinical Social Worker:  Leron Croak, CLINICAL SOCIAL WORKER  Date/time:  01/09/2013 02:28 PM  Clinical Social Work is seeking post-discharge placement for this patient at the following level of care:   SKILLED NURSING   (*CSW will update this form in Epic as items are completed)   01/09/2013  Patient/family provided with [facility] Department of Clinical Social Work's list of facilities offering this level of care within the geographic area requested by the patient (or if unable, by the patient's family).  01/09/2013  Patient/family informed of their freedom to choose among providers that offer the needed level of care, that participate in Medicare, Medicaid or managed care program needed by the patient, have an available bed and are willing to accept the patient.  01/09/2013  Patient/family informed of [facility]' ownership interest in [facility], as well as of the fact that they are under no obligation to receive care at this facility.  PASARR submitted to EDS on 01/09/2013 PASARR number received from EDS on 01/09/2013  FL2 transmitted to all facilities in geographic area requested by pt/family on  01/09/2013 FL2 transmitted to all facilities within larger geographic area on 01/09/2013  Patient informed that his/her managed care company has contracts with or will negotiate with  certain facilities, including the following:     Patient/family informed of bed offers received:  01/11/13 Patient chooses bed at [facility] Physician recommends and patient chooses bed at    Patient to be transferred to  on   Patient to be transferred to facility by   The following physician request were entered in Epic:   Additional Comments:  Leron Croak, [facility]  [phone]

## 2013-01-09 NOTE — Evaluation (Signed)
Occupational Therapy Evaluation Patient Details Name: [name] MRN: [id number] DOB: 05-20-1934 Today's Date: 01/09/2013 Time: 8295-6213 OT Time Calculation (min): 31 min  OT Assessment / Plan / Recommendation History of present illness 79 yo female with CHF with HOCM presenting with anasarca and ARF in the setting of decompensated HF.   Clinical Impression   Pt is overall min assist level for transfers and max assist for LB selfcare tasks.  She demonstrates limited endurance and dyspnea 3/4 with activity.  O2 sats at 97% on 2Ls with activity.  Feel she will benefit from continued acute care OT to help increase independence with ADLs.  Recommend short term SNF rehab at discharge in order for pt to reach supervision to modified independent level.    OT Assessment  Patient needs continued OT Services    Follow Up Recommendations  SNF       Equipment Recommendations  3 in 1 bedside comode       Frequency  Min 2X/week    Precautions / Restrictions Precautions Precautions: Fall Restrictions Weight Bearing Restrictions: No   Pertinent Vitals/Pain Pt with no report of pain   HR 82 BPM and O2 sats 97% on room air    ADL  Eating/Feeding: Performed;Independent Where Assessed - Eating/Feeding: Bed level Grooming: Simulated;Supervision/safety Where Assessed - Grooming: Unsupported sitting Upper Body Bathing: Simulated;Set up Where Assessed - Upper Body Bathing: Unsupported sitting Lower Body Bathing: Simulated;Minimal assistance Where Assessed - Lower Body Bathing: Supported sit to stand Upper Body Dressing: Simulated;Supervision/safety Where Assessed - Upper Body Dressing: Unsupported sitting Lower Body Dressing: Performed;Maximal assistance Where Assessed - Lower Body Dressing: Supported sit to stand Toilet Transfer: Simulated;Minimal assistance Toilet Transfer Method: Other (comment) (ambulate with RW) Acupuncturist: Bedside commode Toileting - Clothing  Manipulation and Hygiene: Performed;Minimal assistance Where Assessed - Engineer, mining and Hygiene: Sit to stand from 3-in-1 or toilet Tub/Shower Transfer Method: Not assessed Equipment Used: Rolling walker;Gait belt Transfers/Ambulation Related to ADLs: Pt requires min assist for mobility using the RW. ADL Comments: Pt with limited endurance became dyspnetic 3/4 with mobility around the room to her bedside chair.  Pt demonstrates decreased ability to reach her feet for dressing tasks may benefit from energy conservation strategies for selfcare tasks.    OT Diagnosis: Generalized weakness  OT Problem List: Decreased strength;Decreased range of motion;Decreased activity tolerance;Impaired balance (sitting and/or standing);Pain;Decreased knowledge of use of DME or AE OT Treatment Interventions: Self-care/ADL training;Therapeutic activities;DME and/or AE instruction;Balance training;Patient/family education   OT Goals(Current goals can be found in the care plan section) Acute Rehab OT Goals Patient Stated Goal: To go to rehab to get stronger before I go home OT Goal Formulation: With patient/family Time For Goal Achievement: 01/23/13 Potential to Achieve Goals: Good  Visit Information  Last OT Received On: 01/09/13 Assistance Needed: +1 History of Present Illness: 79 yo female with CHF with HOCM presenting with anasarca and ARF in the setting of decompensated HF.          Vision/Perception Vision - History Baseline Vision: Wears glasses all the time Patient Visual Report: No change from baseline Vision - Assessment Vision Assessment: Vision not tested Perception Perception: Within Functional Limits Praxis Praxis: Intact   Cognition  Cognition Arousal/Alertness: Awake/alert Behavior During Therapy: WFL for tasks assessed/performed Overall Cognitive Status: Within Functional Limits for tasks assessed    Extremity/Trunk Assessment Upper Extremity Assessment Upper  Extremity Assessment: RUE deficits/detail RUE Deficits / Details: Pt with generalized strength at 4/5 throughout.  Demonstrates decreased  full grasp in the right hand to approximately 70% of normal.   Lower Extremity Assessment Lower Extremity Assessment: Defer to PT evaluation Cervical / Trunk Assessment Cervical / Trunk Assessment: Normal     Mobility Bed Mobility Supine to Sit: 4: Min assist;HOB elevated;With rails Transfers Transfers: Sit to Stand Sit to Stand: 4: Min assist;From bed;With upper extremity assist Stand to Sit: 4: Min assist;With upper extremity assist;To bed Details for Transfer Assistance: Pt needs min assist for sit to stand and mod instructional cues to look up where she is going instead of at the floor.          Balance Balance Balance Assessed: Yes Static Standing Balance Static Standing - Balance Support: No upper extremity supported Static Standing - Level of Assistance: 5: Stand by assistance   End of Session OT - End of Session Equipment Utilized During Treatment: Gait belt Activity Tolerance: Patient limited by fatigue Patient left: in chair;with call bell/phone within reach Nurse Communication: Mobility status     Lycia Sachdeva OTR/L Pager number [phone] 01/09/2013, 1:56 PM

## 2013-01-09 NOTE — Progress Notes (Signed)
VASCULAR LAB PRELIMINARY  PRELIMINARY  PRELIMINARY  PRELIMINARY   Right  Upper Extremity Vein Map    Cephalic  Segment Diameter Depth Comment  1. Axilla 3.69mm mm   2. Mid upper arm 2.66mm mm   3. Above AC 3.7mm mm   4. In [facility] 3.68mm mm   5. Below AC 2.70mm mm branch  6. Mid forearm 1.21mm mm   7. Wrist 1.7mm mm    mm mm    mm mm    mm mm    Basilic  Segment Diameter Depth Comment  1. Axilla mm mm   2. Mid upper arm 3.39mm 24mm origin  3. Above [facility] 2.35mm 24.54mm branch  4. In [facility] 2.62mm 14.65mm   5. Below AC 2.61mm 15.70mm   6. Mid forearm mm mm Too small  7. Wrist mm mm Too small   mm mm    mm mm    mm mm      Left Upper Extremity Vein Map    Cephalic  Segment Diameter Depth Comment  1. Axilla 2.70mm mm   2. Mid upper arm 2.33mm mm   3. Above AC 2.81mm mm   4. In AC 3.79mm mm   5. Below AC 2.74mm mm   6. Mid forearm 1.27mm mm thrombus  7. Wrist 1.50mm mm thrombus   mm mm    mm mm    mm mm    Basilic  Segment Diameter Depth Comment  1. Axilla mm mm  too small, multiple small branches  2. Mid upper arm mm mm   ~~~~  3. Above AC mm mm   ~~~~  4. In AC mm mm ~~~~~~  5. Below AC mm mm ~~~~~~  6. Mid forearm mm mm ~~~~~  7. Wrist mm mm ~~~~~~   mm mm    mm mm    mm mm     Farrel Demark, RDMS, RVT Gertie Fey, RVT, RDCS, RDMS 01/09/2013, 3:27 PM

## 2013-01-09 NOTE — Procedures (Signed)
Tolerating hemodialysis.  She is clearly better with fluid removal.  Coumadin not held.  Will notify pharmacy in anticipation of  AV access and Perm cath placement scheduled for Monday by [facility].  Out patient HD being worked on by [employer] office. [name] C

## 2013-01-09 NOTE — Progress Notes (Signed)
DAILY PROGRESS NOTE  Subjective:  Continues to tolerate HD. BNP has come down remarkably with dialysis. Blood pressure has improved enough to tolerate B-blockers.   Objective:  Temp:  [97.8 F (36.6 C)-98.8 F (37.1 C)] 97.8 F (36.6 C) (06/27 0630) Pulse Rate:  [79-105] 87 (06/27 0832) Resp:  [18-30] 20 (06/27 0832) BP: (88-135)/(36-83) 107/68 mmHg (06/27 0832) SpO2:  [96 %-100 %] 96 % (06/27 0630) Weight:  [215 lb 9.8 oz (97.8 kg)-219 lb 9.3 oz (99.6 kg)] 215 lb 9.8 oz (97.8 kg) (06/27 0630) Weight change: -3 lb 12 oz (-1.7 kg)  Intake/Output from previous day: 06/26 0701 - 06/27 0700 In: 785 [P.O.:785] Out: 60 [Urine:60]  Intake/Output from this shift:    Medications: Current Facility-Administered Medications  Medication Dose Route Frequency Provider Last Rate Last Dose  . 0.9 %  sodium chloride infusion  250 mL Intravenous PRN Brittainy Simmons, PA-C 10 mL/hr at 12/31/12 2100 250 mL at 12/31/12 2100  . 0.9 %  sodium chloride infusion  100 mL Intravenous PRN Lauris Poag, MD      . 0.9 %  sodium chloride infusion  100 mL Intravenous PRN Lauris Poag, MD      . acetaminophen (TYLENOL) tablet 650 mg  650 mg Oral Q4H PRN Brittainy Simmons, PA-C      . allopurinol (ZYLOPRIM) tablet 150 mg  150 mg Oral Daily Brittainy Simmons, PA-C   150 mg at 01/08/13 0951  . ALPRAZolam Prudy Feeler) tablet 0.25 mg  0.25 mg Oral BID PRN Brittainy Simmons, PA-C      . alteplase (CATHFLO ACTIVASE) injection 2 mg  2 mg Intracatheter Once PRN Lauris Poag, MD      . antiseptic oral rinse (BIOTENE) solution 15 mL  15 mL Mouth Rinse BID Chrystie Nose, MD   15 mL at 01/08/13 2000  . camphor-menthol (SARNA) lotion   Topical PRN Dorise Hiss Deterding, MD      . carvedilol (COREG) tablet 3.125 mg  3.125 mg Oral BID WC Lennette Bihari, MD   3.125 mg at 01/08/13 1720  . doxycycline (VIBRA-TABS) tablet 100 mg  100 mg Oral Q12H Sadie Haber, MD   100 mg at 01/08/13 2124  . feeding supplement (ENSURE  COMPLETE) liquid 237 mL  237 mL Oral Q24H Tonye Becket, RD   237 mL at 01/08/13 1030  . feeding supplement (NEPRO CARB STEADY) liquid 237 mL  237 mL Oral PRN Lauris Poag, MD      . feeding supplement (RESOURCE BREEZE) liquid 1 Container  1 Container Oral Q24H Chrystie Nose, MD   1 Container at 01/08/13 1455  . heparin injection 1,000 Units  1,000 Units Dialysis PRN Lauris Poag, MD      . insulin aspart (novoLOG) injection 0-9 Units  0-9 Units Subcutaneous TID WC Chrystie Nose, MD   1 Units at 01/08/13 1720  . insulin aspart protamine- aspart (NOVOLOG 70/30) injection 10 Units  10 Units Subcutaneous Q breakfast Chrystie Nose, MD   10 Units at 01/08/13 0900  . insulin aspart protamine- aspart (NOVOLOG 70/30) injection 5 Units  5 Units Subcutaneous Q supper Chrystie Nose, MD   5 Units at 01/08/13 1849  . lidocaine (PF) (XYLOCAINE) 1 % injection 5 mL  5 mL Intradermal PRN Lauris Poag, MD      . lidocaine-prilocaine (EMLA) cream 1 application  1 application Topical PRN Lauris Poag, MD      .  multivitamin (RENA-VIT) tablet 1 tablet  1 tablet Oral Daily Lauris Poag, MD   1 tablet at 01/08/13 1303  . ondansetron (ZOFRAN) injection 4 mg  4 mg Intravenous Q6H PRN Brittainy Simmons, PA-C      . pantoprazole (PROTONIX) EC tablet 40 mg  40 mg Oral Daily Brittainy Simmons, PA-C   40 mg at 01/08/13 0951  . pentafluoroprop-tetrafluoroeth (GEBAUERS) aerosol 1 application  1 application Topical PRN Lauris Poag, MD      . phenytoin (DILANTIN) ER capsule 300 mg  300 mg Oral QHS Brittainy Simmons, PA-C   300 mg at 01/08/13 2116  . senna-docusate (Senokot-S) tablet 1 tablet  1 tablet Oral QHS PRN Robbie Lis, PA-C   1 tablet at 01/05/13 0958  . simvastatin (ZOCOR) tablet 10 mg  10 mg Oral QHS Brittainy Simmons, PA-C   10 mg at 01/08/13 2117  . sodium chloride 0.9 % injection 3 mL  3 mL Intravenous Q12H Brittainy Simmons, PA-C   3 mL at 01/08/13 2117  . sodium chloride 0.9 %  injection 3 mL  3 mL Intravenous PRN Brittainy Simmons, PA-C      . zolpidem (AMBIEN) tablet 5 mg  5 mg Oral QHS PRN Robbie Lis, PA-C        Physical Exam: General appearance: alert, no distress and seen on dialysis Neck: no adenopathy, no carotid bruit, no JVD, supple, symmetrical, trachea midline and thyroid not enlarged, symmetric, no tenderness/mass/nodules Lungs: clear to auscultation bilaterally Heart: regular rate and rhythm, S1, S2 normal, no murmur, click, rub or gallop Abdomen: soft, non-tender; bowel sounds normal; no masses,  no organomegaly Extremities: edema 1-2+ edema Pulses: 2+ and symmetric Skin: Skin color, texture, turgor normal. No rashes or lesions Neurologic: Grossly normal  Lab Results: Results for orders placed during the hospital encounter of 12/30/12 (from the past 48 hour(s))  GLUCOSE, CAPILLARY     Status: Abnormal   Collection Time    01/07/13 12:43 PM      Result Value Range   Glucose-Capillary 142 (*) 70 - 99 mg/dL  GLUCOSE, CAPILLARY     Status: Abnormal   Collection Time    01/07/13 10:07 PM      Result Value Range   Glucose-Capillary 161 (*) 70 - 99 mg/dL  PROTIME-INR     Status: Abnormal   Collection Time    01/08/13  4:45 AM      Result Value Range   Prothrombin Time 26.6 (*) 11.6 - 15.2 seconds   INR 2.55 (*) 0.00 - 1.49  CBC     Status: Abnormal   Collection Time    01/08/13  4:45 AM      Result Value Range   WBC 13.3 (*) 4.0 - 10.5 K/uL   RBC 3.79 (*) 3.87 - 5.11 MIL/uL   Hemoglobin 11.5 (*) 12.0 - 15.0 g/dL   HCT 40.9  81.1 - 91.4 %   MCV 96.3  78.0 - 100.0 fL   MCH 30.3  26.0 - 34.0 pg   MCHC 31.5  30.0 - 36.0 g/dL   RDW 78.2 (*) 95.6 - 21.3 %   Platelets 138 (*) 150 - 400 K/uL  BASIC METABOLIC PANEL     Status: Abnormal   Collection Time    01/08/13  4:45 AM      Result Value Range   Sodium 139  135 - 145 mEq/L   Potassium 3.8  3.5 - 5.1 mEq/L   Chloride 102  96 - 112  mEq/L   CO2 30  19 - 32 mEq/L   Glucose, Bld  107 (*) 70 - 99 mg/dL   BUN 15  6 - 23 mg/dL   Creatinine, Ser 1.61 (*) 0.50 - 1.10 mg/dL   Calcium 8.3 (*) 8.4 - 10.5 mg/dL   GFR calc non Af Amer 38 (*) >90 mL/min   GFR calc Af Amer 44 (*) >90 mL/min   Comment:            The eGFR has been calculated     using the CKD EPI equation.     This calculation has not been     validated in all clinical     situations.     eGFR's persistently     <90 mL/min signify     possible Chronic Kidney Disease.  PRO B NATRIURETIC PEPTIDE     Status: Abnormal   Collection Time    01/08/13  4:45 AM      Result Value Range   Pro B Natriuretic peptide (BNP) 13502.0 (*) 0 - 450 pg/mL  GLUCOSE, CAPILLARY     Status: Abnormal   Collection Time    01/08/13  7:43 AM      Result Value Range   Glucose-Capillary 110 (*) 70 - 99 mg/dL  GLUCOSE, CAPILLARY     Status: Abnormal   Collection Time    01/08/13 12:08 PM      Result Value Range   Glucose-Capillary 118 (*) 70 - 99 mg/dL  GLUCOSE, CAPILLARY     Status: Abnormal   Collection Time    01/08/13  4:54 PM      Result Value Range   Glucose-Capillary 125 (*) 70 - 99 mg/dL  GLUCOSE, CAPILLARY     Status: Abnormal   Collection Time    01/08/13  9:20 PM      Result Value Range   Glucose-Capillary 68 (*) 70 - 99 mg/dL  GLUCOSE, CAPILLARY     Status: None   Collection Time    01/09/13 12:24 AM      Result Value Range   Glucose-Capillary 88  70 - 99 mg/dL  PROTIME-INR     Status: Abnormal   Collection Time    01/09/13  4:05 AM      Result Value Range   Prothrombin Time 24.9 (*) 11.6 - 15.2 seconds   INR 2.34 (*) 0.00 - 1.49    Imaging: No results found.  Assessment:  1. Principal Problem: 2.   Acute on chronic diastolic CHF (congestive heart failure), NYHA class 3, 09/25/12 3. Active Problems: 4.   Sleep apnea on c-pap  5.   Aortic insufficiency: mild 6.   Chronic venous insufficiency with LE ulcers, will be followed at wound center 7.   Diabetes mellitus type 2 in obese 8.   Hypertrophic  obstructive cardiomyopathy(425.11) 9.   CKD (chronic kidney disease) stage 3, GFR 30-59 ml/min 10.   History of Rt brain CVA 11.   PAF (paroxysmal atrial fibrillation),  12.   Long term (current) use of anticoagulants 13.   Cardiorenal syndrome with renal failure 14.   Cardiogenic shock 15.   Plan:  1. BP fairly stable - was administered coreg last night without difficulty. I think this can be safely continued with attempts to uptitrate it as bp tolerates. CPAP has been re-initiated. She has been having NSVT which we need to monitor, however, EF is preserved which is more comforting. The ventricular ectopy is more evidence for a  possible infiltrative cardiomyopathy.   Time Spent Directly with Patient:  15 minutes  Length of Stay:  LOS: 10 days   Chrystie Nose, MD, [facility] Attending Cardiologist [facility]  Josede Cicero C 01/09/2013, 8:35 AM

## 2013-01-09 NOTE — Progress Notes (Signed)
ANTICOAGULATION CONSULT NOTE - Follow-up Consult  Pharmacy Consult:  Coumadin/Heparin Indication: hx DVT / Afib  Assessment: 79 yo female with CHF with HOCM presenting with anasarca and ARF in the setting of decompensated HF.  Patient also with history of DVT and Afib to hold  Coumadin therapy for HD cath placement on Monday if INR 1.5-1.6 range.  INR remains in therapeutic range today at 2.33.  Plan is to start IV heparin when INR < 2.0.   No bleeding reported and today's CBC WNL.  HOME dose:  She takes 2.5 mg daily except 1.25 mg on Monday and Friday  Drug/Drug interactions:  Hypoprothrombinemic effects of warfarin may be increased by doxycycline and Xanthine Oxidase inhibitors (Allopurinol)   Goal of Therapy:   INR 2-3   Plan:  - NO Coumadin until resumed per MD - Start IV heparin when INR < 2.0 - Daily PT / INR - Weekly CBC - F/U doxycycline LOT (today is day #11 of therapy)  Nadara Mustard, PharmD., MS Clinical Pharmacist Pager:  [phone] Thank you for allowing pharmacy to be part of this patients care team. 01/09/2013, 11:09 AM

## 2013-01-09 NOTE — Progress Notes (Signed)
Patient ID: [name], female   DOB: 1933-09-21, 79 y.o.   MRN: [id number] Patient scheduled for dialysis catheter and AV fistula on Monday-Coumadin has not been held as requested Will leave the patient on scheduled and INR will need to be in the 1.5-1.6 range for surgery on Monday  Vein mapping has not been done-will check with vascular lab to get that done today  Will follow along through the weekend to see if the INR will be satisfactory

## 2013-01-09 NOTE — Care Management Note (Signed)
    Page 1 of 2   01/23/2013     4:09:36 PM   CARE MANAGEMENT NOTE 01/23/2013  Patient:  [name], [name]   Account Number:  [id number]  Date Initiated:  12/31/2012  Documentation initiated by:  Alvira Philips Assessment:   79 yr-old female adm with dx of diastolic failure; lives with spouse, has h/o home health services through [facility]     Action/Plan:   Anticipated DC Date:  01/21/2013   Anticipated DC Plan:  SKILLED NURSING FACILITY  In-house referral  Clinical Social Worker      DC Planning Services  CM consult      Choice offered to / List presented to:             Status of service:  Completed, signed off Medicare Important Message given?   (If response is "NO", the following Medicare IM given date fields will be blank) Date Medicare IM given:   Date Additional Medicare IM given:    Discharge Disposition:  SKILLED NURSING FACILITY  Per UR Regulation:  Reviewed for med. necessity/level of care/duration of stay  If discussed at Long Length of Stay Meetings, dates discussed:   01/06/2013  01/08/2013  01/13/2013  01/15/2013  01/20/2013    Comments:  PCP: Dr Andi Devon  01/23/13 Chayton Murata,RN,BSN [phone] PT FOR DC TODAY TO SNF, PER CSW ARRANGEMENTS.  01/19/13 Constanza Mincy,RN,BSN [phone] PT TRANSFERRED TO 2000 TODAY.  CSW FOLLOWING FOR SNF FOR REHAB AT DC.  WILL FOLLOW PROGRESS.  7/1 1233 debbie dowell rn,bsn soc work has been working w pt and husb on short term snf. will let sw know that may be ready soon.  6/27 1013 debbie dowell rn,bsn phy ther saw pt and rec snf. sw ref made. will cont to follow.  6/26 1504 debbie dowell rn,bsn spoke w pt and husband. they have been married 35yrs. pt hopes for dc home. awaits pt/ot eval.  01-01-13 3:20pm Avie Arenas, RNBSN [phone] Tx to ICU on 6-18 - on CRRT - neo.  Lives with husband at home is active with [facility] and has [facility] set up also. When able will need PT/OT evals for possible  rehab post acute.  12/31/12 1230 Henrietta Mayo RN MSN BSN CCM Referral received for HF homehealth screen.  Pt sleeping, is on dopamine/neosynephrine/lasix gtts,venturi mask.

## 2013-01-09 NOTE — Progress Notes (Signed)
Clinical Social Work Department BRIEF PSYCHOSOCIAL ASSESSMENT 01/09/2013  Patient:  [name], [name]     Account Number:  [id number]     Admit date:  12/30/2012  Clinical Social Worker:  Leron Croak, CLINICAL SOCIAL WORKER  Date/Time:  01/09/2013 01:35 PM  Referred by:  Physician  Date Referred:  01/09/2013 Referred for  SNF Placement   Other Referral:   Interview type:  Patient Other interview type:    PSYCHOSOCIAL DATA Living Status:  HUSBAND Admitted from facility:   Level of care:   Primary support name:  Azalyn Sliwa   [phone] Primary support relationship to patient:  SPOUSE Degree of support available:   Pt has good support from family    CURRENT CONCERNS Current Concerns  Post-Acute Placement   Other Concerns:    SOCIAL WORK ASSESSMENT / PLAN CSW met with the Pt at the bedside. CSW introduced self and purpose for visit. Pt was aware that she need rehab and open to searching for a facility. Pt lives with her husband and stated that she has been "getting weaker" and knows "she needs more care".    CSW confirmed from Pt that she is a newly Dx'd renal (dialysis) Pt and that it is an adjustment for her. Pt gave permission for her information to be faxed   Assessment/plan status:  Information/Referral to [facility] Other assessment/ plan:   Information/referral to community resources:   CSW provided Pt with a SNF listing for [employer] area and explained search process.    PATIENT'S/FAMILY'S RESPONSE TO PLAN OF CARE: Pt was appreciative for assistance with placement.       Leron Croak, LCSWA [facility] Emergency Dept.  [phone]

## 2013-01-09 NOTE — Progress Notes (Signed)
Patient refusing CPAP at this time. Instructed to let the nurse know to call me if she changes her mind. No distress noted, RT will continue to monitor.

## 2013-01-10 LAB — HEPARIN LEVEL (UNFRACTIONATED): Heparin Unfractionated: 0.16 IU/mL — ABNORMAL LOW (ref 0.30–0.70)

## 2013-01-10 LAB — GLUCOSE, CAPILLARY: Glucose-Capillary: 123 mg/dL — ABNORMAL HIGH (ref 70–99)

## 2013-01-10 LAB — PROTIME-INR: Prothrombin Time: 21.7 seconds — ABNORMAL HIGH (ref 11.6–15.2)

## 2013-01-10 MED ORDER — DEXTROSE 5 % IV SOLN: 1.5000 g | INTRAVENOUS | Status: DC

## 2013-01-10 MED ORDER — HEPARIN (PORCINE) IN NACL 100-0.45 UNIT/ML-% IJ SOLN
1450.0000 [IU]/h | INTRAMUSCULAR | Status: DC
  Administered 2013-01-10: 1200 [IU]/h via INTRAVENOUS
  Administered 2013-01-11 – 2013-01-12 (×3): 1450 [IU]/h via INTRAVENOUS
  Filled 2013-01-10 (×10): qty 250

## 2013-01-10 NOTE — Progress Notes (Signed)
Pt. Seen and examined. Agree with the NP/PA-C note as written.  She is feeling better. Down 50 lbs since admission. Tolerating low dose coreg, with little room to up-titrate at this time due to blood pressure. On heparin bridge for AV fistula on Monday.  Chrystie Nose, MD, [facility] Attending Cardiologist [facility]

## 2013-01-10 NOTE — Progress Notes (Signed)
ASSESSMENT/RECOMMENDATIONS:  [name] with DM2, history of CVA, Sz disorder, prior hypertension, baseline CKD3 with baseline creatinine 1.5-1.7 as recently as 09/2012 (never worked up) and diastolic/right sided CHF with HOCM who presents with anasarca and acute renal failure on chronic in the setting of decompensated CHF, hypotension, refractory to IV diuretics.   [facility] Plan: VVS to place both Permcath and AV access Monday.  Last HD Friday.  Subjective: Interval History: none. Objective: Vital signs in last 24 hours: Temp:  [97.4 F (36.3 C)-97.8 F (36.6 C)] 97.4 F (36.3 C) (06/28 1131) Pulse Rate:  [72-74] 72 (06/28 1200) Resp:  [12-32] 12 (06/28 1200) BP: (97-134)/(54-71) 116/60 mmHg (06/28 1200) SpO2:  [93 %-100 %] 93 % (06/28 1200) Weight:  [94.8 kg (208 lb 15.9 oz)] 94.8 kg (208 lb 15.9 oz) (06/28 0731) Weight change:   Intake/Output from previous day: 06/27 0701 - 06/28 0700 In: 1080 [P.O.:1080] Out: 130 [Urine:130] Intake/Output this shift: Total I/O In: 410.4 [P.O.:300; I.V.:110.4] Out: 100 [Urine:100]  General appearance: alert and cooperative Resp: clear to auscultation bilaterally Chest wall: no tenderness Cardio: irregularly irregular rhythm GI: soft, non-tender; bowel sounds normal; no masses,  no organomegaly Extremities: legs wrapped, edema minimal  Lab Results:  Recent Labs  01/08/13 0445  WBC 13.3*  HGB 11.5*  HCT 36.5  PLT 138*   BMET:  Recent Labs  01/08/13 0445  NA 139  K 3.8  CL 102  CO2 30  GLUCOSE 107*  BUN 15  CREATININE 1.32*  CALCIUM 8.3*   No results found for this basename: PTH,  in the last 72 hours Iron Studies: No results found for this basename: IRON, TIBC, TRANSFERRIN, FERRITIN,  in the last 72 hours Studies/Results: No results found.  Scheduled: . allopurinol  150 mg Oral Daily  . antiseptic oral rinse  15 mL Mouth Rinse BID  . carvedilol  3.125 mg Oral BID WC  . [START ON 01/12/2013] cefUROXime (ZINACEF)   IV  1.5 g Intravenous On Call to OR  . doxycycline  100 mg Oral Q12H  . feeding supplement  237 mL Oral Q24H  . feeding supplement  1 Container Oral Q24H  . insulin aspart  0-9 Units Subcutaneous TID WC  . insulin aspart protamine- aspart  10 Units Subcutaneous Q breakfast  . insulin aspart protamine- aspart  5 Units Subcutaneous Q supper  . multivitamin  1 tablet Oral Daily  . pantoprazole  40 mg Oral Daily  . phenytoin  300 mg Oral QHS  . simvastatin  10 mg Oral QHS  . sodium chloride  3 mL Intravenous Q12H    LOS: 11 days   Treon Kehl C 01/10/2013,3:42 PM

## 2013-01-10 NOTE — Progress Notes (Signed)
Subjective: Up in chair, no chest pain, no SOB.  Fell asleep while talking.   Objective: Vital signs in last 24 hours: Temp:  [97.5 F (36.4 C)-97.8 F (36.6 C)] 97.6 F (36.4 C) (06/28 0731) Pulse Rate:  [73-87] 73 (06/28 0400) Resp:  [15-32] 20 (06/28 0400) BP: (89-134)/(54-92) 97/63 mmHg (06/28 0731) SpO2:  [98 %-99 %] 98 % (06/28 0731) Weight:  [208 lb 15.9 oz (94.8 kg)] 208 lb 15.9 oz (94.8 kg) (06/28 0731) Weight change:  Last BM Date: 01/09/13 Intake/Output from previous day: +950  (-16130 since admit) wt 208.15 down from 256 on admit. 06/27 0701 - 06/28 0700 In: 1080 [P.O.:1080] Out: 130 [Urine:130] Intake/Output this shift:    PE: General:Pleasant affect, NAD Skin:Warm and dry, brisk capillary refill HEENT:normocephalic, sclera clear, mucus membranes moist Heart:S1S2 RRR without murmur, gallup, rub or click Lungs:clear without rales, rhonchi, or wheezes ZOX:WRUE, non tender, + BS, do not palpate liver spleen or masses Ext:no lower ext edema, 2+ pedal pulses, 2+ radial pulses Neuro:alert and oriented, MAE, follows commands, + facial symmetry   Lab Results:  Recent Labs  01/08/13 0445  WBC 13.3*  HGB 11.5*  HCT 36.5  PLT 138*   BMET  Recent Labs  01/08/13 0445  NA 139  K 3.8  CL 102  CO2 30  GLUCOSE 107*  BUN 15  CREATININE 1.32*  CALCIUM 8.3*   No results found for this basename: TROPONINI, CK, MB,  in the last 72 hours  Lab Results  Component Value Date   CHOL 111 08/08/2012   HDL 30* 08/08/2012   LDLCALC 59 08/08/2012   TRIG 110 08/08/2012   CHOLHDL 3.7 08/08/2012   Lab Results  Component Value Date   HGBA1C 8.1* 08/08/2012     Lab Results  Component Value Date   TSH 0.992 09/25/2012      Studies/Results: No results found.  Medications: I have reviewed the patient's current medications. Scheduled Meds: . allopurinol  150 mg Oral Daily  . antiseptic oral rinse  15 mL Mouth Rinse BID  . carvedilol  3.125 mg Oral BID WC  .  [START ON 01/12/2013] cefUROXime (ZINACEF)  IV  1.5 g Intravenous On Call to OR  . doxycycline  100 mg Oral Q12H  . feeding supplement  237 mL Oral Q24H  . feeding supplement  1 Container Oral Q24H  . insulin aspart  0-9 Units Subcutaneous TID WC  . insulin aspart protamine- aspart  10 Units Subcutaneous Q breakfast  . insulin aspart protamine- aspart  5 Units Subcutaneous Q supper  . multivitamin  1 tablet Oral Daily  . pantoprazole  40 mg Oral Daily  . phenytoin  300 mg Oral QHS  . simvastatin  10 mg Oral QHS  . sodium chloride  3 mL Intravenous Q12H   Continuous Infusions: . heparin 1,200 Units/hr (01/10/13 0743)   PRN Meds:.sodium chloride, acetaminophen, ALPRAZolam, camphor-menthol, ondansetron (ZOFRAN) IV, senna-docusate, sodium chloride, zolpidem  Assessment/Plan: Principal Problem:   Acute on chronic diastolic CHF (congestive heart failure), NYHA class 3, 09/25/12 Active Problems:   Sleep apnea on c-pap    Aortic insufficiency: mild   Chronic venous insufficiency with LE ulcers, will be followed at wound center   Diabetes mellitus type 2 in obese   Hypertrophic obstructive cardiomyopathy(425.11)   History of Rt brain CVA   PAF (paroxysmal atrial fibrillation),    Long term (current) use of anticoagulants   Chronic kidney disease (CKD), stage IV (severe), now with  HD   Cardiorenal syndrome with renal failure   Cardiogenic shock  PLAN:coumadin on hold for dialysis cath and AV fistula, INR <2 begin Heparin.   LOS: 11 days   Time spent with pt. :15 minutes. [facility] R  Nurse Practitioner Certified Pager [phone] 01/10/2013, 8:07 AM

## 2013-01-10 NOTE — Progress Notes (Signed)
ANTICOAGULATION CONSULT NOTE - Follow Up Consult  Pharmacy Consult for heparin Indication: Hx DVT, afib  No Known Allergies  Patient Measurements: Height: 5\' 5"  (165.1 cm) Weight: 208 lb 15.9 oz (94.8 kg) IBW/kg (Calculated) : 57 Heparin Dosing Weight: 78.3kg  Vital Signs: Temp: 97.8 F (36.6 C) (06/28 1547) Temp src: Oral (06/28 1547) BP: 116/60 mmHg (06/28 1200) Pulse Rate: 72 (06/28 1200)  Labs:  Recent Labs  01/08/13 0445 01/09/13 0405 01/10/13 0520 01/10/13 1730  HGB 11.5*  --   --   --   HCT 36.5  --   --   --   PLT 138*  --   --   --   LABPROT 26.6* 24.9* 21.7*  --   INR 2.55* 2.34* 1.96*  --   HEPARINUNFRC  --   --   --  0.16*  CREATININE 1.32*  --   --   --     Estimated Creatinine Clearance: 40.6 ml/min (by C-G formula based on Cr of 1.32).  Assessment: 89 yof continues on IV heparin while coumadin in on hold for HD cath placement on Monday. Initial heparin level is subtherapeutic at 0.16. No bleeding noted.   Goal of Therapy:  Heparin level 0.3-0.7 units/ml Monitor platelets by anticoagulation protocol: Yes   Plan:  1. Increase heparin gtt to 1450 units/hr 2. Check an 8 hour heparin level 3. Continue daily heparin level and CBC  Charene Mccallister, Drake Leach 01/10/2013,6:24 PM

## 2013-01-10 NOTE — Progress Notes (Signed)
ANTICOAGULATION CONSULT NOTE - Follow-up Consult  Pharmacy Consult:  Coumadin/Heparin Indication: hx DVT / Afib  Assessment: 79 yo female with CHF with HOCM presenting with anasarca and ARF in the setting of decompensated HF.  Patient also with history of DVT and Afib to hold  Coumadin therapy for HD cath placement on Monday if INR 1.5-1.6 range.  INR is now at 1.96 and we will start IV heparin for bridge therapy.  No bleeding reported and today's CBC WNL.  HOME dose:  She takes 2.5 mg daily except 1.25 mg on Monday and Friday  Drug/Drug interactions:  Hypoprothrombinemic effects of warfarin may be increased by doxycycline and Xanthine Oxidase inhibitors (Allopurinol)   Goal of Therapy:   INR 2-3 Heparin level 0.3-0.7   Plan:  - NO Coumadin until resumed per MD - Start IV heparin at 1200 units/hr - Will check an 8 hour level to ensure therapeutic goal - Daily CBC - F/U doxycycline LOT (today is day #12 of therapy)  Nadara Mustard, PharmD., MS Clinical Pharmacist Pager:  [phone] Thank you for allowing pharmacy to be part of this patients care team. 01/10/2013, 6:59 AM

## 2013-01-11 LAB — CBC
Hemoglobin: 11.9 g/dL — ABNORMAL LOW (ref 12.0–15.0)
RBC: 3.91 MIL/uL (ref 3.87–5.11)
WBC: 13.8 10*3/uL — ABNORMAL HIGH (ref 4.0–10.5)

## 2013-01-11 LAB — BASIC METABOLIC PANEL
CO2: 28 mEq/L (ref 19–32)
Chloride: 99 mEq/L (ref 96–112)
GFR calc non Af Amer: 26 mL/min — ABNORMAL LOW (ref >90)
Glucose, Bld: 99 mg/dL (ref 70–99)
Potassium: 3.7 mEq/L (ref 3.5–5.1)
Sodium: 136 mEq/L (ref 135–145)

## 2013-01-11 LAB — GLUCOSE, CAPILLARY
Glucose-Capillary: 103 mg/dL — ABNORMAL HIGH (ref 70–99)
Glucose-Capillary: 95 mg/dL (ref 70–99)
Glucose-Capillary: 127 mg/dL — ABNORMAL HIGH (ref 70–99)
Glucose-Capillary: 134 mg/dL — ABNORMAL HIGH (ref 70–99)
Glucose-Capillary: 80 mg/dL (ref 70–99)

## 2013-01-11 MED ORDER — PHYTONADIONE 5 MG PO TABS
2.5000 mg | ORAL_TABLET | Freq: Once | ORAL | Status: AC
  Administered 2013-01-11: 2.5 mg via ORAL
  Filled 2013-01-11: qty 1

## 2013-01-11 NOTE — Clinical Social Work Note (Signed)
Clinical Social Work met with patient at bedside to review bed offers for SNF placement.  Patient verbalized understanding and agreed to have her husband tour facilities.  [name] expressed interest in [facility] due to location and familiarity.  CSW will follow up with patient throughout hospitalization to finalize discharge plans.  Kathrin Penner, MSW, LCSW Clinical Social Worker Weekend coverage

## 2013-01-11 NOTE — Progress Notes (Signed)
ANTICOAGULATION CONSULT NOTE - Follow Up Consult  Pharmacy Consult for heparin Indication: Hx DVT, afib  No Known Allergies  Patient Measurements: Height: 5\' 5"  (165.1 cm) Weight: 209 lb 10.5 oz (95.1 kg) IBW/kg (Calculated) : 57 Heparin Dosing Weight: 78.3kg  Vital Signs: Temp: 97.3 F (36.3 C) (06/29 0736) Temp src: Oral (06/29 0736) BP: 102/50 mmHg (06/29 0736) Pulse Rate: 76 (06/29 0736)  Labs:  Recent Labs  01/09/13 0405 01/10/13 0520 01/10/13 1730 01/11/13 0319 01/11/13 0320 01/11/13 0909  HGB  --   --   --  11.9*  --   --   HCT  --   --   --  36.9  --   --   PLT  --   --   --  220  --   --   LABPROT 24.9* 21.7*  --   --   --  20.8*  INR 2.34* 1.96*  --   --   --  1.85*  HEPARINUNFRC  --   --  0.16*  --  0.58  --   CREATININE  --   --   --  1.82*  --   --    Estimated Creatinine Clearance: 29.5 ml/min (by C-G formula based on Cr of 1.82).  Assessment: 71 yof continues on IV heparin while coumadin is on hold for permanent HD cath placement on Monday. Heparin level is therapeutic today at 0.58 on Iv rate of 1450 units/hr.  She is without noted bleeding complications with her anticoagulation and her CBC is stable.  Her INR is 1.85 and Vitamin K 2.5 mg has been ordered.     Goal of Therapy:  Heparin level 0.3-0.7 units/ml Monitor platelets by anticoagulation protocol: Yes   Plan:  1. Continue IV heparin gtt at 1450 units/hr 2. Continue daily heparin level and CBC 3. Monitor for s/s of bleeding 4. F/U plans to resume Warfarin  Nadara Mustard, PharmD., MS Clinical Pharmacist Pager:  [phone] Thank you for allowing pharmacy to be part of this patients care team. 01/11/2013,11:24 AM

## 2013-01-11 NOTE — Progress Notes (Signed)
Subjective: No complaints,no chest pain, no SOB, up in chair  Objective: Vital signs in last 24 hours: Temp:  [97.3 F (36.3 C)-98.3 F (36.8 C)] 97.3 F (36.3 C) (06/29 0736) Pulse Rate:  [70-144] 76 (06/29 0736) Resp:  [12-26] 24 (06/29 0736) BP: (97-124)/(50-66) 102/50 mmHg (06/29 0736) SpO2:  [93 %-100 %] 94 % (06/29 0736) Weight:  [209 lb 10.5 oz (95.1 kg)] 209 lb 10.5 oz (95.1 kg) (06/29 0630) Weight change:  Last BM Date: 01/10/13 Intake/Output from previous day: +960 (-15144 since admit) wt up 1 pound since yesterday. 06/28 0701 - 06/29 0700 In: 1235.4 [P.O.:720; I.V.:515.4] Out: 250 [Urine:250] Intake/Output this shift:    PE: General:Pleasant affect, NAD Skin:Warm and dry, brisk capillary refill HEENT:normocephalic, sclera clear, mucus membranes moist Heart:S1S2 RRR with 2/6 systolic murmur, no gallup, rub or click Lungs:clear without rales, rhonchi, or wheezes ZOX:WRUE, non tender, + BS, do not palpate liver spleen or masses Ext:no lower ext edema, 2+ pedal pulses, 2+ radial pulses Neuro:alert and oriented, MAE, follows commands, + facial symmetry   Lab Results:  Recent Labs  01/11/13 0319  WBC 13.8*  HGB 11.9*  HCT 36.9  PLT 220   BMET  Recent Labs  01/11/13 0319  NA 136  K 3.7  CL 99  CO2 28  GLUCOSE 99  BUN 35*  CREATININE 1.82*  CALCIUM 8.5   No results found for this basename: TROPONINI, CK, MB,  in the last 72 hours  Lab Results  Component Value Date   CHOL 111 08/08/2012   HDL 30* 08/08/2012   LDLCALC 59 08/08/2012   TRIG 110 08/08/2012   CHOLHDL 3.7 08/08/2012   Lab Results  Component Value Date   HGBA1C 8.1* 08/08/2012     Lab Results  Component Value Date   TSH 0.992 09/25/2012    Hepatic Function Panel Studies/Results: No results found.  Medications: I have reviewed the patient's current medications. Scheduled Meds: . allopurinol  150 mg Oral Daily  . antiseptic oral rinse  15 mL Mouth Rinse BID  . carvedilol  3.125  mg Oral BID WC  . [START ON 01/12/2013] cefUROXime (ZINACEF)  IV  1.5 g Intravenous On Call to OR  . doxycycline  100 mg Oral Q12H  . feeding supplement  237 mL Oral Q24H  . feeding supplement  1 Container Oral Q24H  . insulin aspart  0-9 Units Subcutaneous TID WC  . insulin aspart protamine- aspart  10 Units Subcutaneous Q breakfast  . insulin aspart protamine- aspart  5 Units Subcutaneous Q supper  . multivitamin  1 tablet Oral Daily  . pantoprazole  40 mg Oral Daily  . phenytoin  300 mg Oral QHS  . simvastatin  10 mg Oral QHS  . sodium chloride  3 mL Intravenous Q12H   Continuous Infusions: . heparin 1,450 Units/hr (01/11/13 0330)   PRN Meds:.sodium chloride, acetaminophen, ALPRAZolam, camphor-menthol, ondansetron (ZOFRAN) IV, senna-docusate, sodium chloride, zolpidem  Assessment/Plan: Principal Problem:   Acute on chronic diastolic CHF (congestive heart failure), NYHA class 3, 09/25/12 Active Problems:   Sleep apnea on c-pap    Aortic insufficiency: mild   Chronic venous insufficiency with LE ulcers, will be followed at wound center   Diabetes mellitus type 2 in obese   Hypertrophic obstructive cardiomyopathy(425.11)   History of Rt brain CVA   PAF (paroxysmal atrial fibrillation),    Long term (current) use of anticoagulants   Chronic kidney disease (CKD), stage IV (severe), now with HD  Cardiorenal syndrome with renal failure   Cardiogenic shock  PLAN: Permcath and AV access planned for Monday.  Last HD was Friday. Glucose controlled.  VS stable.  LOS: 12 days   Time spent with pt. :15 minutes. [facility] R  Nurse Practitioner Certified Pager [phone] 01/11/2013, 7:49 AM

## 2013-01-11 NOTE — Progress Notes (Signed)
ASSESSMENT/RECOMMENDATIONS:  [name] with DM2, history of CVA, Sz disorder, prior hypertension, baseline CKD3 with baseline creatinine 1.5-1.7 as recently as 09/2012 (never worked up) and diastolic/right sided CHF with HOCM who presents with anasarca and acute renal failure on chronic in the setting of decompensated CHF, hypotension, refractory to IV diuretics.  +++New [place] ESRD  Plan: VVS to place both Permcath and AV access Monday. Last HD Friday. Next HD Monday. Outpt HD plans pending.  Subjective: Interval History: No complaints  Objective: Vital signs in last 24 hours: Temp:  [97.3 F (36.3 C)-98.3 F (36.8 C)] 97.3 F (36.3 C) (06/29 0736) Pulse Rate:  [70-144] 76 (06/29 0736) Resp:  [12-24] 24 (06/29 0736) BP: (97-124)/(50-66) 102/50 mmHg (06/29 0736) SpO2:  [93 %-97 %] 94 % (06/29 0736) Weight:  [95.1 kg (209 lb 10.5 oz)] 95.1 kg (209 lb 10.5 oz) (06/29 0630) Weight change:   Intake/Output from previous day: 06/28 0701 - 06/29 0700 In: 1235.4 [P.O.:720; I.V.:515.4] Out: 250 [Urine:250] Intake/Output this shift: Total I/O In: 240 [P.O.:240] Out: -   General appearance: alert and cooperative Resp: clear to auscultation bilaterally Chest wall: no tenderness GI: soft, non-tender; bowel sounds normal; no masses,  no organomegaly Extremities: legs wrapped, some edema above wrap  Lab Results:  Recent Labs  01/11/13 0319  WBC 13.8*  HGB 11.9*  HCT 36.9  PLT 220   BMET:  Recent Labs  01/11/13 0319  NA 136  K 3.7  CL 99  CO2 28  GLUCOSE 99  BUN 35*  CREATININE 1.82*  CALCIUM 8.5   No results found for this basename: PTH,  in the last 72 hours Iron Studies: No results found for this basename: IRON, TIBC, TRANSFERRIN, FERRITIN,  in the last 72 hours Studies/Results: No results found.  Scheduled: . allopurinol  150 mg Oral Daily  . antiseptic oral rinse  15 mL Mouth Rinse BID  . carvedilol  3.125 mg Oral BID WC  . [START ON 01/12/2013] cefUROXime  (ZINACEF)  IV  1.5 g Intravenous On Call to OR  . doxycycline  100 mg Oral Q12H  . feeding supplement  237 mL Oral Q24H  . feeding supplement  1 Container Oral Q24H  . insulin aspart  0-9 Units Subcutaneous TID WC  . insulin aspart protamine- aspart  10 Units Subcutaneous Q breakfast  . insulin aspart protamine- aspart  5 Units Subcutaneous Q supper  . multivitamin  1 tablet Oral Daily  . pantoprazole  40 mg Oral Daily  . phenytoin  300 mg Oral QHS  . simvastatin  10 mg Oral QHS  . sodium chloride  3 mL Intravenous Q12H    LOS: 12 days   Ryott Rafferty C 01/11/2013,9:48 AM

## 2013-01-11 NOTE — Progress Notes (Signed)
Pt. Seen and examined. Agree with the NP/PA-C note as written. Seems to be doing fairly well. Vitals stable - no chest pain. Plan for permanent dialysis access on Monday. There is no BP room to uptitrate b-blockers at this time.  Chrystie Nose, MD, [facility] Attending Cardiologist [facility]

## 2013-01-11 NOTE — Progress Notes (Signed)
Patient to just wear oxygen instead of CPAP at rest again tonight, instructed to call if she changes her mind. RT will continue to monitor.

## 2013-01-11 NOTE — Progress Notes (Signed)
[facility] [place]  Subjective  -   No complanits   Physical Exam:  Lungs clear Palpable Left radial and brachial pulse       Assessment/Plan:    I have reviewed her vein map.  She is a candidate for Left Brachiocephalic fistula.  I discussed with the patient the risk and benefits as well as the details of the procedure, including the risk of non-maturity and risk of steal syndrome  INR is 1.8 today, will give Vit K and re-check in am  Hendrix Console IV, V. WELLS 01/11/2013 11:02 AM --  Filed Vitals:   01/11/13 0736  BP: 102/50  Pulse: 76  Temp: 97.3 F (36.3 C)  Resp: 24    Intake/Output Summary (Last 24 hours) at 01/11/13 1102 Last data filed at 01/11/13 0900  Gross per 24 hour  Intake 1296.04 ml  Output    150 ml  Net 1146.04 ml     Laboratory CBC    Component Value Date/Time   WBC 13.8* 01/11/2013 0319   HGB 11.9* 01/11/2013 0319   HCT 36.9 01/11/2013 0319   PLT 220 01/11/2013 0319    BMET    Component Value Date/Time   NA 136 01/11/2013 0319   K 3.7 01/11/2013 0319   CL 99 01/11/2013 0319   CO2 28 01/11/2013 0319   GLUCOSE 99 01/11/2013 0319   BUN 35* 01/11/2013 0319   CREATININE 1.82* 01/11/2013 0319   CREATININE 1.87* 11/26/2012 1200   CALCIUM 8.5 01/11/2013 0319   GFRNONAA 26* 01/11/2013 0319   GFRAA 30* 01/11/2013 0319    COAG Lab Results  Component Value Date   INR 1.85* 01/11/2013   INR 1.96* 01/10/2013   INR 2.34* 01/09/2013   No results found for this basename: PTT    Antibiotics Anti-infectives   Start     Dose/Rate Route Frequency Ordered Stop   01/12/13 0600  cefUROXime (ZINACEF) 1.5 g in dextrose 5 % 50 mL IVPB     1.5 g 100 mL/hr over 30 Minutes Intravenous On call to O.R. 01/09/13 1202 01/13/13 0559   01/12/13 0600  cefUROXime (ZINACEF) 1.5 g in dextrose 5 % 50 mL IVPB  Status:  Discontinued     1.5 g 100 mL/hr over 30 Minutes Intravenous On call to O.R. 01/10/13 4098 01/10/13 0909   01/10/13 0600  cefUROXime  (ZINACEF) 1.5 g in dextrose 5 % 50 mL IVPB  Status:  Discontinued     1.5 g 100 mL/hr over 30 Minutes Intravenous On call to O.R. 01/09/13 1159 01/09/13 1202   12/31/12 2200  doxycycline (VIBRA-TABS) tablet 100 mg     100 mg Oral Every 12 hours 12/31/12 1335     12/31/12 1000  sulfamethoxazole-trimethoprim (BACTRIM DS) 800-160 MG per tablet 1 tablet  Status:  Discontinued     1 tablet Oral Daily 12/31/12 0901 12/31/12 1334   12/30/12 2200  sulfamethoxazole-trimethoprim (BACTRIM DS) 800-160 MG per tablet 1 tablet  Status:  Discontinued     1 tablet Oral 2 times daily 12/30/12 2114 12/31/12 0901       V. Charlena Cross, M.D. [facility] [place] Office: [phone] Pager:  [phone]

## 2013-01-12 ENCOUNTER — Telehealth: Payer: Self-pay | Admitting: Vascular Surgery

## 2013-01-12 ENCOUNTER — Encounter (HOSPITAL_COMMUNITY): Payer: Self-pay | Admitting: Anesthesiology

## 2013-01-12 ENCOUNTER — Encounter (HOSPITAL_COMMUNITY)
Admission: EM | Disposition: A | Discharge status: Skilled Nursing Facility | Payer: Self-pay | Source: Home / Self Care | Attending: Internal Medicine

## 2013-01-12 ENCOUNTER — Inpatient Hospital Stay (HOSPITAL_COMMUNITY): Payer: Medicare Other | Admitting: Anesthesiology

## 2013-01-12 ENCOUNTER — Inpatient Hospital Stay (HOSPITAL_COMMUNITY): Payer: Medicare Other

## 2013-01-12 DIAGNOSIS — R609 Edema, unspecified: Secondary | ICD-10-CM

## 2013-01-12 HISTORY — PX: INSERTION OF DIALYSIS CATHETER: SHX1324

## 2013-01-12 HISTORY — PX: AV FISTULA PLACEMENT: SHX1204

## 2013-01-12 LAB — BASIC METABOLIC PANEL
CO2: 27 mEq/L (ref 19–32)
Calcium: 8.7 mg/dL (ref 8.4–10.5)
GFR calc non Af Amer: 25 mL/min — ABNORMAL LOW (ref >90)
Potassium: 4.1 mEq/L (ref 3.5–5.1)
Sodium: 137 mEq/L (ref 135–145)

## 2013-01-12 LAB — GLUCOSE, CAPILLARY
Glucose-Capillary: 98 mg/dL (ref 70–99)
Glucose-Capillary: 101 mg/dL — ABNORMAL HIGH (ref 70–99)
Glucose-Capillary: 88 mg/dL (ref 70–99)

## 2013-01-12 LAB — CBC
HCT: 37.5 % (ref 36.0–46.0)
RBC: 3.98 MIL/uL (ref 3.87–5.11)
RDW: 16.7 % — ABNORMAL HIGH (ref 11.5–15.5)
WBC: 12.2 10*3/uL — ABNORMAL HIGH (ref 4.0–10.5)

## 2013-01-12 LAB — HEPARIN LEVEL (UNFRACTIONATED): Heparin Unfractionated: 0.69 IU/mL (ref 0.30–0.70)

## 2013-01-12 LAB — PROTIME-INR: INR: 1.58 — ABNORMAL HIGH (ref 0.00–1.49)

## 2013-01-12 SURGERY — ARTERIOVENOUS (AV) FISTULA CREATION
Anesthesia: Monitor Anesthesia Care | Site: Neck | Wound class: Clean

## 2013-01-12 MED ORDER — WARFARIN - PHARMACIST DOSING INPATIENT
Freq: Every day | Status: DC
  Administered 2013-01-14: 18:00:00

## 2013-01-12 MED ORDER — SODIUM CHLORIDE 0.9 % IV SOLN: 100.0000 mL | INTRAVENOUS | Status: DC | PRN

## 2013-01-12 MED ORDER — PENTAFLUOROPROP-TETRAFLUOROETH EX AERO: 1.0000 "application " | INHALATION_SPRAY | CUTANEOUS | Status: DC | PRN

## 2013-01-12 MED ORDER — PROPOFOL 10 MG/ML IV BOLUS
INTRAVENOUS | Status: DC | PRN
  Administered 2013-01-12 (×3): 20 mg via INTRAVENOUS

## 2013-01-12 MED ORDER — SODIUM CHLORIDE 0.9 % IR SOLN
Status: DC | PRN
  Administered 2013-01-12: 11:00:00

## 2013-01-12 MED ORDER — WARFARIN SODIUM 5 MG PO TABS
5.0000 mg | ORAL_TABLET | Freq: Once | ORAL | Status: AC
  Administered 2013-01-12: 5 mg via ORAL
  Filled 2013-01-12 (×2): qty 1

## 2013-01-12 MED ORDER — FENTANYL CITRATE 0.05 MG/ML IJ SOLN: 25.0000 ug | INTRAMUSCULAR | Status: DC | PRN

## 2013-01-12 MED ORDER — HEPARIN SODIUM (PORCINE) 1000 UNIT/ML IJ SOLN
INTRAMUSCULAR | Status: AC
  Filled 2013-01-12: qty 1

## 2013-01-12 MED ORDER — SODIUM CHLORIDE 0.9 % IV SOLN
INTRAVENOUS | Status: DC
  Administered 2013-01-12: 10:00:00 via INTRAVENOUS

## 2013-01-12 MED ORDER — LIDOCAINE HCL (PF) 1 % IJ SOLN: 5.0000 mL | INTRAMUSCULAR | Status: DC | PRN

## 2013-01-12 MED ORDER — LIDOCAINE HCL (CARDIAC) 20 MG/ML IV SOLN
INTRAVENOUS | Status: DC | PRN
  Administered 2013-01-12: 50 mg via INTRAVENOUS

## 2013-01-12 MED ORDER — LIDOCAINE-EPINEPHRINE 0.5 %-1:200000 IJ SOLN
INTRAMUSCULAR | Status: DC | PRN
  Administered 2013-01-12: 50 mL

## 2013-01-12 MED ORDER — ALTEPLASE 2 MG IJ SOLR
2.0000 mg | Freq: Once | INTRAMUSCULAR | Status: DC | PRN
  Filled 2013-01-12: qty 2

## 2013-01-12 MED ORDER — ONDANSETRON HCL 4 MG/2ML IJ SOLN
INTRAMUSCULAR | Status: DC | PRN
  Administered 2013-01-12: 4 mg via INTRAVENOUS

## 2013-01-12 MED ORDER — HEPARIN SODIUM (PORCINE) 1000 UNIT/ML IJ SOLN
INTRAMUSCULAR | Status: DC | PRN
  Administered 2013-01-12: 1000 [IU]

## 2013-01-12 MED ORDER — NEPRO/CARBSTEADY PO LIQD
237.0000 mL | ORAL | Status: DC | PRN
  Filled 2013-01-12: qty 237

## 2013-01-12 MED ORDER — SODIUM CHLORIDE 0.9 % IV SOLN
INTRAVENOUS | Status: DC | PRN
  Administered 2013-01-12: 11:00:00 via INTRAVENOUS

## 2013-01-12 MED ORDER — MIDAZOLAM HCL 5 MG/5ML IJ SOLN
INTRAMUSCULAR | Status: DC | PRN
  Administered 2013-01-12 (×2): 1 mg via INTRAVENOUS

## 2013-01-12 MED ORDER — HEPARIN SODIUM (PORCINE) 1000 UNIT/ML DIALYSIS
1000.0000 [IU] | INTRAMUSCULAR | Status: DC | PRN
  Administered 2013-01-12: 2000 [IU] via INTRAVENOUS_CENTRAL

## 2013-01-12 MED ORDER — LIDOCAINE-EPINEPHRINE 0.5 %-1:200000 IJ SOLN
INTRAMUSCULAR | Status: AC
  Filled 2013-01-12: qty 1

## 2013-01-12 MED ORDER — OXYCODONE HCL 5 MG PO TABS: 5.0000 mg | ORAL_TABLET | ORAL | Status: DC | PRN

## 2013-01-12 MED ORDER — BOOST / RESOURCE BREEZE PO LIQD
1.0000 | Freq: Two times a day (BID) | ORAL | Status: DC
  Administered 2013-01-13 – 2013-01-23 (×11): 1 via ORAL

## 2013-01-12 MED ORDER — 0.9 % SODIUM CHLORIDE (POUR BTL) OPTIME
TOPICAL | Status: DC | PRN
  Administered 2013-01-12: 1000 mL

## 2013-01-12 MED ORDER — HEPARIN SODIUM (PORCINE) 1000 UNIT/ML DIALYSIS
20.0000 [IU]/kg | INTRAMUSCULAR | Status: DC | PRN
  Administered 2013-01-13: 1900 [IU] via INTRAVENOUS_CENTRAL

## 2013-01-12 MED ORDER — LIDOCAINE-PRILOCAINE 2.5-2.5 % EX CREA
1.0000 "application " | TOPICAL_CREAM | CUTANEOUS | Status: DC | PRN
  Filled 2013-01-12: qty 5

## 2013-01-12 MED ORDER — FENTANYL CITRATE 0.05 MG/ML IJ SOLN
INTRAMUSCULAR | Status: DC | PRN
  Administered 2013-01-12: 25 ug via INTRAVENOUS
  Administered 2013-01-12 (×2): 50 ug via INTRAVENOUS

## 2013-01-12 MED ORDER — ALTEPLASE 2 MG IJ SOLR
2.0000 mg | Freq: Once | INTRAMUSCULAR | Status: AC
  Administered 2013-01-12: 2 mg
  Filled 2013-01-12 (×2): qty 2

## 2013-01-12 SURGICAL SUPPLY — 67 items
APL SKNCLS STERI-STRIP NONHPOA (GAUZE/BANDAGES/DRESSINGS) ×2
ARMBAND PINK RESTRICT EXTREMIT (MISCELLANEOUS) ×3 IMPLANT
BAG DECANTER FOR FLEXI CONT (MISCELLANEOUS) ×2 IMPLANT
BENZOIN TINCTURE PRP APPL 2/3 (GAUZE/BANDAGES/DRESSINGS) ×3 IMPLANT
CANISTER SUCTION 2500CC (MISCELLANEOUS) ×3 IMPLANT
CATH CANNON HEMO 15F 50CM (CATHETERS) IMPLANT
CATH CANNON HEMO 15FR 19 (HEMODIALYSIS SUPPLIES) IMPLANT
CATH CANNON HEMO 15FR 23CM (HEMODIALYSIS SUPPLIES) IMPLANT
CATH CANNON HEMO 15FR 31CM (HEMODIALYSIS SUPPLIES) IMPLANT
CATH CANNON HEMO 15FR 32 (HEMODIALYSIS SUPPLIES) IMPLANT
CATH CANNON HEMO 15FR 32CM (HEMODIALYSIS SUPPLIES) ×3 IMPLANT
CLIP LIGATING EXTRA MED SLVR (CLIP) ×3 IMPLANT
CLIP LIGATING EXTRA SM BLUE (MISCELLANEOUS) ×3 IMPLANT
CLOTH BEACON ORANGE TIMEOUT ST (SAFETY) ×3 IMPLANT
COVER PROBE W GEL 5X96 (DRAPES) ×4 IMPLANT
COVER SURGICAL LIGHT HANDLE (MISCELLANEOUS) ×3 IMPLANT
DECANTER SPIKE VIAL GLASS SM (MISCELLANEOUS) ×3 IMPLANT
DRAPE C-ARM 42X72 X-RAY (DRAPES) ×3 IMPLANT
DRAPE CHEST BREAST 15X10 FENES (DRAPES) ×3 IMPLANT
ELECT REM PT RETURN 9FT ADLT (ELECTROSURGICAL) ×3
ELECTRODE REM PT RTRN 9FT ADLT (ELECTROSURGICAL) ×2 IMPLANT
GAUZE SPONGE 2X2 8PLY STRL LF (GAUZE/BANDAGES/DRESSINGS) ×2 IMPLANT
GAUZE SPONGE 4X4 16PLY XRAY LF (GAUZE/BANDAGES/DRESSINGS) ×2 IMPLANT
GEL ULTRASOUND 20GR AQUASONIC (MISCELLANEOUS) IMPLANT
GLOVE BIO SURGEON STRL SZ7.5 (GLOVE) ×2 IMPLANT
GLOVE BIOGEL PI IND STRL 6.5 (GLOVE) IMPLANT
GLOVE BIOGEL PI IND STRL 7.0 (GLOVE) IMPLANT
GLOVE BIOGEL PI IND STRL 8 (GLOVE) IMPLANT
GLOVE BIOGEL PI INDICATOR 6.5 (GLOVE) ×1
GLOVE BIOGEL PI INDICATOR 7.0 (GLOVE) ×3
GLOVE BIOGEL PI INDICATOR 8 (GLOVE) ×3
GLOVE ECLIPSE 6.5 STRL STRAW (GLOVE) ×1 IMPLANT
GLOVE SS BIOGEL STRL SZ 6.5 (GLOVE) IMPLANT
GLOVE SS BIOGEL STRL SZ 7.5 (GLOVE) ×2 IMPLANT
GLOVE SUPERSENSE BIOGEL SZ 6.5 (GLOVE) ×1
GLOVE SUPERSENSE BIOGEL SZ 7.5 (GLOVE) ×1
GOWN STRL NON-REIN LRG LVL3 (GOWN DISPOSABLE) ×10 IMPLANT
KIT BASIN OR (CUSTOM PROCEDURE TRAY) ×3 IMPLANT
KIT ROOM TURNOVER OR (KITS) ×3 IMPLANT
NDL 18GX1X1/2 (RX/OR ONLY) (NEEDLE) ×2 IMPLANT
NDL HYPO 25GX1X1/2 BEV (NEEDLE) ×2 IMPLANT
NEEDLE 18GX1X1/2 (RX/OR ONLY) (NEEDLE) ×3 IMPLANT
NEEDLE 22X1 1/2 (OR ONLY) (NEEDLE) ×3 IMPLANT
NEEDLE HYPO 25GX1X1/2 BEV (NEEDLE) IMPLANT
NS IRRIG 1000ML POUR BTL (IV SOLUTION) ×3 IMPLANT
PACK CV ACCESS (CUSTOM PROCEDURE TRAY) ×3 IMPLANT
PACK SURGICAL SETUP 50X90 (CUSTOM PROCEDURE TRAY) ×2 IMPLANT
PAD ARMBOARD 7.5X6 YLW CONV (MISCELLANEOUS) ×6 IMPLANT
SOAP 2 % CHG 4 OZ (WOUND CARE) ×3 IMPLANT
SPONGE GAUZE 2X2 STER 10/PKG (GAUZE/BANDAGES/DRESSINGS) ×1
SPONGE GAUZE 4X4 12PLY (GAUZE/BANDAGES/DRESSINGS) ×3 IMPLANT
STRIP CLOSURE SKIN 1/2X4 (GAUZE/BANDAGES/DRESSINGS) ×3 IMPLANT
SUT ETHILON 3 0 PS 1 (SUTURE) ×3 IMPLANT
SUT PROLENE 6 0 CC (SUTURE) ×3 IMPLANT
SUT VIC AB 3-0 SH 27 (SUTURE) ×3
SUT VIC AB 3-0 SH 27X BRD (SUTURE) ×2 IMPLANT
SUT VICRYL 4-0 PS2 18IN ABS (SUTURE) ×3 IMPLANT
SYR 20CC LL (SYRINGE) ×3 IMPLANT
SYR 30ML LL (SYRINGE) IMPLANT
SYR 5ML LL (SYRINGE) ×6 IMPLANT
SYR CONTROL 10ML LL (SYRINGE) ×2 IMPLANT
SYRINGE 10CC LL (SYRINGE) ×3 IMPLANT
TAPE CLOTH SURG 4X10 WHT LF (GAUZE/BANDAGES/DRESSINGS) ×2 IMPLANT
TOWEL OR 17X24 6PK STRL BLUE (TOWEL DISPOSABLE) ×3 IMPLANT
TOWEL OR 17X26 10 PK STRL BLUE (TOWEL DISPOSABLE) ×3 IMPLANT
UNDERPAD 30X30 INCONTINENT (UNDERPADS AND DIAPERS) ×3 IMPLANT
WATER STERILE IRR 1000ML POUR (IV SOLUTION) ×3 IMPLANT

## 2013-01-12 NOTE — Progress Notes (Signed)
01/12/2013 2:22 PM Hemodialysis Outpatient Note; this patient has been accepted at the [facility] on a Tues/Thurs/Sat 2nd shift schedule. Whether patient begins Tues or Thurs July 1st or the 3rd please instruct patient to arrive at the center at 10:30 AM to sign paperwork and begin treatment at 11:45. Thank you. Tilman Neat

## 2013-01-12 NOTE — Transfer of Care (Signed)
Immediate Anesthesia Transfer of Care Note  Patient: [name]  Procedure(s) Performed: Procedure(s): ARTERIOVENOUS (AV) FISTULA CREATION (N/A) INSERTION OF DIALYSIS CATHETER (N/A)  Patient Location: PACU  Anesthesia Type:MAC  Level of Consciousness: awake, alert , oriented and patient cooperative  Airway & Oxygen Therapy: Patient Spontanous Breathing and Patient connected to nasal cannula oxygen  Post-op Assessment: Report given to PACU RN, Post -op Vital signs reviewed and stable and Patient moving all extremities  Post vital signs: Reviewed and stable  Complications: No apparent anesthesia complications

## 2013-01-12 NOTE — Telephone Encounter (Addendum)
Message copied by Rosalyn Charters on Mon Jan 12, 2013  4:26 PM ------      Message from: Marlowe Shores      Created: Mon Jan 12, 2013 12:44 PM       1 month F/U AVF - early ------ unable to reach patient by phone mailed letter tonotified patient of fu appt. with dr. early on 02-17-13 9:30 am

## 2013-01-12 NOTE — Op Note (Signed)
OPERATIVE REPORT  DATE OF SURGERY: 01/12/2013  PATIENT: [name], 79 y.o. female MRN: [id number]  DOB: Feb 19, 1934  PRE-OPERATIVE DIAGNOSIS: End-stage renal disease  POST-OPERATIVE DIAGNOSIS:  Same  PROCEDURE: #1 left upper arm AV fistula creation #2 right IJ hemodialysis catheter  SURGEON:  Gretta Began, M.D.  PHYSICIAN ASSISTANT: Roczniak  ANESTHESIA:  Local with sedation  EBL: Minimal ml  Total I/O In: 973 [P.O.:120; I.V.:350; IV Piggyback:503] Out: -   BLOOD ADMINISTERED: None  DRAINS: None  SPECIMEN: None  COUNTS CORRECT:  YES  PLAN OF CARE: PACU with chest x-ray pending   PATIENT DISPOSITION:  PACU - hemodynamically stable  PROCEDURE DETAILS: The patient was taken to the operating placed supine position where the area the left arm was prepped and draped in usual sterile fashion. Incision was made over the antecubital space using local anesthesia and the cephalic vein was identified. Tributary branches were ligated with 30 and 4-0 silk ties and divided divided. The vein was mobilized and was ligated distally and brought into approximation with the brachial artery. The brachial artery was of normal size with minimal atherosclerotic change. The artery was occluded proximally and distally and opened with an 11 blade and extended longitudinally with Potts scissors. The vein was cut to appropriate length and spatulated. The vein was sewn end-to-side to the artery with a running 6-0 Prolene suture. Clamps removed and good thrill was noted. Wound irrigated with saline. Hemostasis electrocautery. The patient did maintain a loop left radial pulse.  Next the right neck was prepped and draped in usual sterile fashion. The patient had a temporary IJ catheter in place. This was prepped as well. The guidewire was passed down to the existing temporary catheter and the catheter was removed. A dilator and peel-away sheath was passed over the guidewire. The dilator and guidewire removed. A  27 cm catheter was passed through the peel-away sheath and the tips were positioned the level of the distal right atrium. The peel-away sheath was removed. Catheter was brought through a subcutaneous tunnel through a separate stab incision. A 2 lm ports were attached in both lumens flushed and aspirated easily and were locked with 1000 unit per cc heparin. The catheter was secured to the skin with a 3-0 nylon stitch and the entry site was closed with a 4-0 subcuticular Vicryl stitch. Sterile dressing was applied and the patient was taken to the recovery room in stable condition   Gretta Began, M.D. 01/12/2013 12:55 PM

## 2013-01-12 NOTE — Progress Notes (Signed)
PT Cancellation Note  Patient Details Name: [name] MRN: [id number] DOB: 30-Aug-1933   Cancelled Treatment:    Reason Eval/Treat Not Completed: Patient at procedure or test/unavailable. Pt off the floor at DIALYSIS. PT to re-attempt when able.   Marcene Brawn 01/12/2013, 3:22 PM

## 2013-01-12 NOTE — H&P (View-Only) (Signed)
[facility] [place]  Subjective  -   No complanits   Physical Exam:  Lungs clear Palpable Left radial and brachial pulse       Assessment/Plan:    I have reviewed her vein map.  She is a candidate for Left Brachiocephalic fistula.  I discussed with the patient the risk and benefits as well as the details of the procedure, including the risk of non-maturity and risk of steal syndrome  INR is 1.8 today, will give Vit K and re-check in am  Cicely Ortner IV, V. WELLS 01/11/2013 11:02 AM --  Filed Vitals:   01/11/13 0736  BP: 102/50  Pulse: 76  Temp: 97.3 F (36.3 C)  Resp: 24    Intake/Output Summary (Last 24 hours) at 01/11/13 1102 Last data filed at 01/11/13 0900  Gross per 24 hour  Intake 1296.04 ml  Output    150 ml  Net 1146.04 ml     Laboratory CBC    Component Value Date/Time   WBC 13.8* 01/11/2013 0319   HGB 11.9* 01/11/2013 0319   HCT 36.9 01/11/2013 0319   PLT 220 01/11/2013 0319    BMET    Component Value Date/Time   NA 136 01/11/2013 0319   K 3.7 01/11/2013 0319   CL 99 01/11/2013 0319   CO2 28 01/11/2013 0319   GLUCOSE 99 01/11/2013 0319   BUN 35* 01/11/2013 0319   CREATININE 1.82* 01/11/2013 0319   CREATININE 1.87* 11/26/2012 1200   CALCIUM 8.5 01/11/2013 0319   GFRNONAA 26* 01/11/2013 0319   GFRAA 30* 01/11/2013 0319    COAG Lab Results  Component Value Date   INR 1.85* 01/11/2013   INR 1.96* 01/10/2013   INR 2.34* 01/09/2013   No results found for this basename: PTT    Antibiotics Anti-infectives   Start     Dose/Rate Route Frequency Ordered Stop   01/12/13 0600  cefUROXime (ZINACEF) 1.5 g in dextrose 5 % 50 mL IVPB     1.5 g 100 mL/hr over 30 Minutes Intravenous On call to O.R. 01/09/13 1202 01/13/13 0559   01/12/13 0600  cefUROXime (ZINACEF) 1.5 g in dextrose 5 % 50 mL IVPB  Status:  Discontinued     1.5 g 100 mL/hr over 30 Minutes Intravenous On call to O.R. 01/10/13 0904 01/10/13 0909   01/10/13 0600  cefUROXime  (ZINACEF) 1.5 g in dextrose 5 % 50 mL IVPB  Status:  Discontinued     1.5 g 100 mL/hr over 30 Minutes Intravenous On call to O.R. 01/09/13 1159 01/09/13 1202   12/31/12 2200  doxycycline (VIBRA-TABS) tablet 100 mg     100 mg Oral Every 12 hours 12/31/12 1335     12/31/12 1000  sulfamethoxazole-trimethoprim (BACTRIM DS) 800-160 MG per tablet 1 tablet  Status:  Discontinued     1 tablet Oral Daily 12/31/12 0901 12/31/12 1334   12/30/12 2200  sulfamethoxazole-trimethoprim (BACTRIM DS) 800-160 MG per tablet 1 tablet  Status:  Discontinued     1 tablet Oral 2 times daily 12/30/12 2114 12/31/12 0901       V. Wells Mickal Meno IV, M.D. [facility] [place] Office: [phone] Pager:  [phone]  

## 2013-01-12 NOTE — Progress Notes (Addendum)
ANTICOAGULATION CONSULT NOTE - Follow Up Consult  Pharmacy Consult for heparin Indication: Hx DVT, afib  No Known Allergies  Patient Measurements: Height: 5\' 5"  (165.1 cm) Weight: 210 lb 5.1 oz (95.4 kg) IBW/kg (Calculated) : 57 Heparin Dosing Weight: 78.3kg  Vital Signs: Temp: 97.5 F (36.4 C) (06/30 0815) Temp src: Oral (06/30 0815) BP: 137/62 mmHg (06/30 0815) Pulse Rate: 77 (06/30 0815)  Labs:  Recent Labs  01/10/13 0520 01/10/13 1730 01/11/13 0319 01/11/13 0320 01/11/13 0909 01/12/13 0500  HGB  --   --  11.9*  --   --  12.1  HCT  --   --  36.9  --   --  37.5  PLT  --   --  220  --   --  258  LABPROT 21.7*  --   --   --  20.8* 18.4*  INR 1.96*  --   --   --  1.85* 1.58*  HEPARINUNFRC  --  0.16*  --  0.58  --  0.69  CREATININE  --   --  1.82*  --   --  1.87*   Estimated Creatinine Clearance: 28.8 ml/min (by C-G formula based on Cr of 1.87).  Assessment: 61 yof continues on IV heparin 1450 uts/hr HL 0.69  while coumadin is on hold for permanent HD cath placement on Monday.  No noted bleeding complications with her anticoagulation and her CBC is stable.  Her INR is 1.58 after Vitamin K 2.5 mg given 6/29.  Heparin is off now for cath placement - f/u later today for restart.  Goal of Therapy:  Heparin level 0.3-0.7 units/ml Monitor platelets by anticoagulation protocol: Yes  INR 2-3 Plan:  1. Continue IV heparin gtt at 1450 units/hr 2. Continue daily heparin level and CBC 3. Monitor for s/s of bleeding 4. F/U plans to resume Heparin and  Warfarin  Leota Sauers Pharm.D. CPP, BCPS Clinical Pharmacist [phone] 01/12/2013 10:36 AM    PM addendum Per RN restarting heparin drip post procedure Will continue daily HL and CBC  Warfarin to restart today 6/30 - Home dose 2.5mg  daily except, 1.25 mg MF Will give Coumadin 5mg  x1 as INR will probably continue to trendi down d/t Vit K administration  Leota Sauers Pharm.D. CPP, BCPS Clinical  Pharmacist [phone] 01/12/2013 2:21 PM

## 2013-01-12 NOTE — Progress Notes (Signed)
[facility]  Subjective: No complaints. Feeling better.  Objective: Vital signs in last 24 hours: Temp:  [97.5 F (36.4 C)-98 F (36.7 C)] 97.8 F (36.6 C) (06/30 0336) Pulse Rate:  [73-76] 73 (06/30 0336) Resp:  [17-20] 17 (06/30 0336) BP: (91-126)/(45-71) 115/59 mmHg (06/30 0336) SpO2:  [92 %-98 %] 98 % (06/30 0336) Weight:  [210 lb 5.1 oz (95.4 kg)] 210 lb 5.1 oz (95.4 kg) (06/30 0505) Last BM Date: 01/10/13  Intake/Output from previous day: 06/29 0701 - 06/30 0700 In: 1173.5 [P.O.:720; I.V.:453.5] Out: 325 [Urine:325] Intake/Output this shift:    Medications Current Facility-Administered Medications  Medication Dose Route Frequency Provider Last Rate Last Dose  . 0.9 %  sodium chloride infusion  250 mL Intravenous PRN Brittainy Simmons, PA-C 10 mL/hr at 01/11/13 2000 250 mL at 01/11/13 2000  . acetaminophen (TYLENOL) tablet 650 mg  650 mg Oral Q4H PRN Brittainy Simmons, PA-C      . allopurinol (ZYLOPRIM) tablet 150 mg  150 mg Oral Daily Brittainy Simmons, PA-C   150 mg at 01/11/13 0917  . ALPRAZolam Prudy Feeler) tablet 0.25 mg  0.25 mg Oral BID PRN Brittainy Simmons, PA-C      . antiseptic oral rinse (BIOTENE) solution 15 mL  15 mL Mouth Rinse BID Chrystie Nose, MD   15 mL at 01/11/13 2000  . camphor-menthol (SARNA) lotion   Topical PRN Dorise Hiss Deterding, MD      . carvedilol (COREG) tablet 3.125 mg  3.125 mg Oral BID WC Lennette Bihari, MD   3.125 mg at 01/11/13 1748  . cefUROXime (ZINACEF) 1.5 g in dextrose 5 % 50 mL IVPB  1.5 g Intravenous On Call to OR Amelia Jo Roczniak, PA-C      . doxycycline (VIBRA-TABS) tablet 100 mg  100 mg Oral Q12H Sadie Haber, MD   100 mg at 01/11/13 2210  . feeding supplement (ENSURE COMPLETE) liquid 237 mL  237 mL Oral Q24H Tonye Becket, RD   237 mL at 01/11/13 1025  . feeding supplement (RESOURCE BREEZE) liquid 1 Container  1 Container Oral Q24H Chrystie Nose, MD   1 Container at 01/11/13 1500  .  heparin ADULT infusion 100 units/mL (25000 units/250 mL)  1,450 Units/hr Intravenous Continuous Drake Leach Rumbarger, RPH 14.5 mL/hr at 01/12/13 0056 1,450 Units/hr at 01/12/13 0056  . insulin aspart (novoLOG) injection 0-9 Units  0-9 Units Subcutaneous TID WC Chrystie Nose, MD   1 Units at 01/11/13 1748  . insulin aspart protamine- aspart (NOVOLOG 70/30) injection 10 Units  10 Units Subcutaneous Q breakfast Chrystie Nose, MD   10 Units at 01/11/13 [phone]  . insulin aspart protamine- aspart (NOVOLOG 70/30) injection 5 Units  5 Units Subcutaneous Q supper Chrystie Nose, MD   5 Units at 01/11/13 1749  . multivitamin (RENA-VIT) tablet 1 tablet  1 tablet Oral Daily Lauris Poag, MD   1 tablet at 01/11/13 [phone]  . ondansetron (ZOFRAN) injection 4 mg  4 mg Intravenous Q6H PRN Brittainy Simmons, PA-C      . pantoprazole (PROTONIX) EC tablet 40 mg  40 mg Oral Daily Brittainy Simmons, PA-C   40 mg at 01/11/13 0917  . phenytoin (DILANTIN) ER capsule 300 mg  300 mg Oral QHS Brittainy Simmons, PA-C   300 mg at 01/11/13 2210  . senna-docusate (Senokot-S) tablet 1 tablet  1 tablet Oral QHS PRN Robbie Lis, PA-C   1 tablet at 01/05/13 0958  .  simvastatin (ZOCOR) tablet 10 mg  10 mg Oral QHS Brittainy Simmons, PA-C   10 mg at 01/11/13 2210  . sodium chloride 0.9 % injection 3 mL  3 mL Intravenous Q12H Brittainy Simmons, PA-C      . sodium chloride 0.9 % injection 3 mL  3 mL Intravenous PRN Brittainy Simmons, PA-C      . zolpidem (AMBIEN) tablet 5 mg  5 mg Oral QHS PRN Robbie Lis, PA-C        PE: General appearance: alert, cooperative and no distress Lungs: clear to auscultation bilaterally Heart: regular rate and rhythm, S1, S2 normal, no murmur, click, rub or gallop Extremities: no LEE Pulses: 2+ and symmetric Skin: warm and dry Neurologic: Grossly normal  Lab Results:   Recent Labs  01/11/13 0319 01/12/13 0500  WBC 13.8* 12.2*  HGB 11.9* 12.1  HCT 36.9 37.5  PLT 220 258    BMET  Recent Labs  01/11/13 0319 01/12/13 0500  NA 136 137  K 3.7 4.1  CL 99 99  CO2 28 27  GLUCOSE 99 102*  BUN 35* 40*  CREATININE 1.82* 1.87*  CALCIUM 8.5 8.7   PT/INR  Recent Labs  01/10/13 0520 01/11/13 0909 01/12/13 0500  LABPROT 21.7* 20.8* 18.4*  INR 1.96* 1.85* 1.58*   Assessment/Plan  Principal Problem:   Acute on chronic diastolic CHF (congestive heart failure), NYHA class 3, 09/25/12 Active Problems:   Sleep apnea on c-pap    Aortic insufficiency: mild   Chronic venous insufficiency with LE ulcers, will be followed at wound center   Diabetes mellitus type 2 in obese   Hypertrophic obstructive cardiomyopathy(425.11)   History of Rt brain CVA   PAF (paroxysmal atrial fibrillation),    Long term (current) use of anticoagulants   Chronic kidney disease (CKD), stage IV (severe), now with HD   Cardiorenal syndrome with renal failure   Cardiogenic shock  Plan: BP has improved. Most recent SPB is 115. ? Increasing Coreg dose today. Plan for Permcath and AV access today for HD.    LOS: 13 days   Brittainy M. Delmer Islam 01/12/2013 7:46 AM  I have seen and evaluated the patient this AM along with Boyce Medici, PA. I agree with her findings, examination as well as impression recommendations.  Looks remarkably well - but is a bit Net + since her last HD. I agree that, while it would be nice to titrate up her BB, we simply do not have a consistently high enough BP to do so.  Plan is for more permanent access today.  Will need warfarin re-started post-procedure.  IV Heparin bridging. No further NSVT.   MD Time with pt: 5 min  HARDING,DAVID W, M.D., M.S. [facility] [address] [place], [place]  [address]  [phone] Pager # [phone] 01/12/2013 9:01 AM

## 2013-01-12 NOTE — Progress Notes (Signed)
Patient placed on auto CPAP with a low pressure of 5 cmH2O and a high pressure of 20 cmH2O and 3 liters of oxygen bled in.  Patient tolerating well at this time. RT will continue to monitor.

## 2013-01-12 NOTE — Interval H&P Note (Signed)
History and Physical Interval Note:  01/12/2013 7:19 AM  Leafy Half  has presented today for surgery, with the diagnosis of End Stage Renal Disease  The various methods of treatment have been discussed with the patient and family. After consideration of risks, benefits and other options for treatment, the patient has consented to  Procedure(s): ARTERIOVENOUS (AV) FISTULA CREATION (N/A) INSERTION OF DIALYSIS CATHETER (N/A) as a surgical intervention .  The patient's history has been reviewed, patient examined, no change in status, stable for surgery.  I have reviewed the patient's chart and labs.  Questions were answered to the patient's satisfaction.     Hamzeh Tall

## 2013-01-12 NOTE — Progress Notes (Signed)
NUTRITION FOLLOW UP  Intervention:   1. D/c Ensure 2. Resource Breeze now BID  Nutrition Dx:   Inadequate oral intake related to decreased appetite as evidenced by diet recall.  Ongoing  Goal:   PO intake to meet >/=90% estimated nutrition needs. Unmet  Monitor:   PO intake, weight trends, labs, I/O's  Assessment:   Pt has transitioned to traditional HD, now HD dependant  and s/p Permcath and AV access today.   Pt reports her appetite continues to be poor. Meals not well documented but pt reports she is not eating much. Does like the [employer]. Will d/c Ensure now that she is on HD. Increase Resource to BID. Has Nepro shakes PRN.   Height: Ht Readings from Last 1 Encounters:  01/12/13 5\' 5"  (1.651 m)    Weight Status:   Wt Readings from Last 1 Encounters:  01/12/13 210 lb 5.1 oz (95.4 kg)  Down from 256 on admission.   Re-estimated needs:  Kcal: 2300-2500 Protein: >/=103 gm Fluid: 1.2 L   Skin: bilateral lower leg wounds related to edema, new incisions r/t access  Diet Order: NPO   Intake/Output Summary (Last 24 hours) at 01/12/13 1412 Last data filed at 01/12/13 1233  Gross per 24 hour  Intake   1530 ml  Output    325 ml  Net   1205 ml  -13.3 L this admission   Last BM: 6/30   Labs:   Recent Labs Lab 01/05/13 1600 01/06/13 0420 01/08/13 0445 01/11/13 0319 01/12/13 0500  NA 135 137 139 136 137  K 4.6 4.5 3.8 3.7 4.1  CL 101 102 102 99 99  CO2 27 27 30 28 27   BUN 19 18 15  35* 40*  CREATININE 1.26* 1.14* 1.32* 1.82* 1.87*  CALCIUM 8.5 8.6 8.3* 8.5 8.7  MG  --  2.2  --   --   --   PHOS 1.8* 2.0*  --   --   --   GLUCOSE 102* 106* 107* 99 102*    CBG (last 3)   Recent Labs  01/11/13 2142 01/12/13 0818 01/12/13 1250  GLUCAP 80 98 101*    Scheduled Meds: . allopurinol  150 mg Oral Daily  . antiseptic oral rinse  15 mL Mouth Rinse BID  . doxycycline  100 mg Oral Q12H  . feeding supplement  237 mL Oral Q24H  . feeding  supplement  1 Container Oral Q24H  . insulin aspart  0-9 Units Subcutaneous TID WC  . insulin aspart protamine- aspart  10 Units Subcutaneous Q breakfast  . insulin aspart protamine- aspart  5 Units Subcutaneous Q supper  . multivitamin  1 tablet Oral Daily  . pantoprazole  40 mg Oral Daily  . phenytoin  300 mg Oral QHS  . simvastatin  10 mg Oral QHS  . sodium chloride  3 mL Intravenous Q12H    Continuous Infusions: . sodium chloride 10 mL/hr at 01/12/13 0949  . heparin Stopped (01/12/13 0900)    Clarene Duke RD, LDN Pager [phone] After Hours pager [phone]

## 2013-01-12 NOTE — Progress Notes (Signed)
Subjective: Interval History: none.  Objective: Vital signs in last 24 hours: Temp:  [97.5 F (36.4 C)-98 F (36.7 C)] 97.8 F (36.6 C) (06/30 0336) Pulse Rate:  [73-76] 73 (06/30 0336) Resp:  [17-20] 17 (06/30 0336) BP: (91-126)/(45-71) 115/59 mmHg (06/30 0336) SpO2:  [92 %-98 %] 98 % (06/30 0336) Weight:  [95.4 kg (210 lb 5.1 oz)] 95.4 kg (210 lb 5.1 oz) (06/30 0505) Weight change: 0.6 kg (1 lb 5.2 oz)  Intake/Output from previous day: 06/29 0701 - 06/30 0700 In: 1173.5 [P.O.:720; I.V.:453.5] Out: 325 [Urine:325] Intake/Output this shift:    General appearance: alert, cooperative and moderately obese Neck: R IJ Cath Resp: diminished breath sounds bilaterally and rales bibasilar Cardio: regular rate and rhythm GI: obese,pos bs,liver down 5 cm Extremities: edema 3+  Lab Results:  Recent Labs  01/11/13 0319 01/12/13 0500  WBC 13.8* 12.2*  HGB 11.9* 12.1  HCT 36.9 37.5  PLT 220 258   BMET:  Recent Labs  01/11/13 0319 01/12/13 0500  NA 136 137  K 3.7 4.1  CL 99 99  CO2 28 27  GLUCOSE 99 102*  BUN 35* 40*  CREATININE 1.82* 1.87*  CALCIUM 8.5 8.7   No results found for this basename: PTH,  in the last 72 hours Iron Studies: No results found for this basename: IRON, TIBC, TRANSFERRIN, FERRITIN,  in the last 72 hours  Studies/Results: No results found.  I have reviewed the patient's current medications.  Assessment/Plan: 1 ESRD per Dr. Lowell Guitar. Will cont to lower vol.  NO room for Coreg now but in future may use. Need to get secondrary issues addressed 2 DM controlle 3 Obesity 4 CHF controlled with HD 5 HTN not an issue 6 HPTH check PTH 7 CVA 8 OSA P HD, access, check PTH, check Fe    LOS: 13 days   Marcene Laskowski L 01/12/2013,8:05 AM

## 2013-01-12 NOTE — Anesthesia Postprocedure Evaluation (Signed)
  Anesthesia Post-op Note  Patient: [name]  Procedure(s) Performed: Procedure(s): ARTERIOVENOUS (AV) FISTULA CREATION (N/A) INSERTION OF DIALYSIS CATHETER (N/A)  Patient Location: PACU  Anesthesia Type:General  Level of Consciousness: awake  Airway and Oxygen Therapy: Patient Spontanous Breathing  Post-op Pain: mild  Post-op Assessment: Post-op Vital signs reviewed  Post-op Vital Signs: Reviewed  Complications: No apparent anesthesia complications

## 2013-01-12 NOTE — Procedures (Signed)
I was present at this session.  I have reviewed the session itself and made appropriate changes.  Prob with cath, accept low flow, add hep.  [name] 6/30/20143:28 PM

## 2013-01-12 NOTE — OR Nursing (Signed)
End of procedure 1 @1202 .

## 2013-01-12 NOTE — Anesthesia Preprocedure Evaluation (Signed)
Anesthesia Evaluation  Patient identified by MRN, date of birth, ID band Patient awake    Reviewed: Allergy & Precautions, H&P , NPO status , Patient's Chart, lab work & pertinent test results  Airway Mallampati: II      Dental   Pulmonary shortness of breath and with exertion, sleep apnea and Continuous Positive Airway Pressure Ventilation ,  breath sounds clear to auscultation        Cardiovascular hypertension, + Peripheral Vascular Disease and +CHF Rhythm:Regular Rate:Normal     Neuro/Psych Seizures -,  TIA   GI/Hepatic Neg liver ROS, GERD-  Controlled,  Endo/Other  diabetes  Renal/GU Renal disease     Musculoskeletal   Abdominal   Peds  Hematology   Anesthesia Other Findings   Reproductive/Obstetrics                           Anesthesia Physical Anesthesia Plan  ASA: IV  Anesthesia Plan: MAC   Post-op Pain Management:    Induction: Intravenous  Airway Management Planned: Simple Face Mask  Additional Equipment:   Intra-op Plan:   Post-operative Plan:   Informed Consent:   Plan Discussed with: CRNA, Anesthesiologist and Surgeon  Anesthesia Plan Comments:         Anesthesia Quick Evaluation

## 2013-01-12 NOTE — Progress Notes (Signed)
6:26 AM 01/12/2013     Patient on OR schedule for 10 am per [facility]. Elizabeth Sauer RN in HD notified. HD rescheduled per HD nurse.   Carlyon Prows RN

## 2013-01-13 LAB — APTT: aPTT: >200 seconds (ref 24–37)

## 2013-01-13 LAB — RENAL FUNCTION PANEL
Albumin: 2.2 g/dL — ABNORMAL LOW (ref 3.5–5.2)
BUN: 44 mg/dL — ABNORMAL HIGH (ref 6–23)
CO2: 24 mEq/L (ref 19–32)
Calcium: 8.2 mg/dL — ABNORMAL LOW (ref 8.4–10.5)
Creatinine, Ser: 1.94 mg/dL — ABNORMAL HIGH (ref 0.50–1.10)
GFR calc non Af Amer: 24 mL/min — ABNORMAL LOW (ref >90)

## 2013-01-13 LAB — CBC
MCH: 30.7 pg (ref 26.0–34.0)
MCHC: 31.9 g/dL (ref 30.0–36.0)
MCV: 96.2 fL (ref 78.0–100.0)
Platelets: 183 10*3/uL (ref 150–400)

## 2013-01-13 LAB — HEPARIN LEVEL (UNFRACTIONATED): Heparin Unfractionated: >2 IU/mL — ABNORMAL HIGH (ref 0.30–0.70)

## 2013-01-13 MED ORDER — WARFARIN SODIUM 1 MG PO TABS
1.0000 mg | ORAL_TABLET | Freq: Once | ORAL | Status: AC
  Administered 2013-01-13: 1 mg via ORAL
  Filled 2013-01-13: qty 1

## 2013-01-13 MED ORDER — SODIUM CHLORIDE 0.9 % IV BOLUS (SEPSIS)
200.0000 mL | Freq: Once | INTRAVENOUS | Status: AC
  Administered 2013-01-13: 200 mL via INTRAVENOUS

## 2013-01-13 MED ORDER — MIDODRINE HCL 5 MG PO TABS
10.0000 mg | ORAL_TABLET | Freq: Once | ORAL | Status: DC
  Filled 2013-01-13: qty 2

## 2013-01-13 MED ORDER — SODIUM CHLORIDE 0.9 % IV SOLN
125.0000 mg | INTRAVENOUS | Status: DC
  Filled 2013-01-13: qty 10

## 2013-01-13 MED ORDER — MIDODRINE HCL 5 MG PO TABS: 10.0000 mg | ORAL_TABLET | Freq: Once | ORAL | Status: DC

## 2013-01-13 MED ORDER — DOPAMINE-DEXTROSE 3.2-5 MG/ML-% IV SOLN
INTRAVENOUS | Status: AC
  Administered 2013-01-13: 5 ug/kg/min via INTRAVENOUS
  Filled 2013-01-13: qty 250

## 2013-01-13 MED ORDER — MIDODRINE HCL 5 MG PO TABS
10.0000 mg | ORAL_TABLET | Freq: Once | ORAL | Status: AC
  Administered 2013-01-13: 10 mg via ORAL
  Filled 2013-01-13: qty 2

## 2013-01-13 MED ORDER — SODIUM CHLORIDE 0.9 % IV SOLN
125.0000 mg | INTRAVENOUS | Status: AC
  Administered 2013-01-13 – 2013-01-20 (×4): 125 mg via INTRAVENOUS
  Filled 2013-01-13 (×8): qty 10

## 2013-01-13 MED ORDER — DOPAMINE-DEXTROSE 3.2-5 MG/ML-% IV SOLN: 2.0000 ug/kg/min | INTRAVENOUS | Status: DC

## 2013-01-13 NOTE — Plan of Care (Signed)
Problem: Phase III Progression Outcomes Goal: Activity at appropriate level-compared to baseline (UP IN CHAIR FOR HEMODIALYSIS)  Outcome: Not Progressing SBP = 70'S

## 2013-01-13 NOTE — Progress Notes (Signed)
Physical Therapy Treatment Patient Details Name: [name] MRN: [id number] DOB: 12-18-1933 Today's Date: 01/13/2013 Time: 2841-3244 PT Time Calculation (min): 33 min  PT Assessment / Plan / Recommendation  PT Comments   Pt making progress toward PT goals. Pt required less assistance to perform bed mobility and transfers. Pt continues to be weak needing frequent rest breaks between exercise. Pt did not require supplemental 02 during treatment. Her Sp02 stayed above 91% the entire treatment. She would greatly benefit from SNF at d/c in order to increase her strength, exercise tolerance, and functional abilities.   Follow Up Recommendations  SNF     Does the patient have the potential to tolerate intense rehabilitation     Barriers to Discharge        Equipment Recommendations  None recommended by PT    Recommendations for Other Services    Frequency Min 3X/week   Progress towards PT Goals Progress towards PT goals: Progressing toward goals  Plan Current plan remains appropriate    Precautions / Restrictions Precautions Precautions: Fall Restrictions Weight Bearing Restrictions: No   Pertinent Vitals/Pain Patient BP prior to Tx 71/46 (Brachial) Patient BP after Tx 92/37 (Brachial) Nursing was notified of pt's BP prior to and after PT treatment session. Upon arrival BP cuff was located at radial artery site. BP 71/46 supine radial artery site. Notified RN who requested to position cuff differently. BP 73/12 brachia artery site supine, 90/49 sitting EOB, & 92/37 after transferring to recliner.  The value was increased but the pt's BP remained low. Pt was asymptomatic for the duration of the therapy session. 02 Sats above 91% on RA entire Tx session.    Mobility  Bed Mobility Bed Mobility: Supine to Sit;Sitting - Scoot to Edge of Bed Supine to Sit: 4: Min assist;With rails;HOB elevated Sitting - Scoot to [place] of Bed: 4: Min assist;With rail Details for Bed Mobility Assistance: pt  required cueing for hand placement and safety. She required min physical assistance to get to EOB. Transfers Transfers: Sit to Stand;Stand to Sit;Stand Pivot Transfers Sit to Stand: 1: +2 Total assist;From bed Sit to Stand: Patient Percentage: 60% Stand to Sit: 1: +2 Total assist;With upper extremity assist;With armrests;To chair/3-in-1 Stand to Sit: Patient Percentage: 70% Stand Pivot Transfers: 1: +2 Total assist;From elevated surface Stand Pivot Transfers: Patient Percentage: 70% Details for Transfer Assistance: pt needed physical assistance to achieve an upright posture when standing. Cueing to pivot within the RW while performing SPT. Ambulation/Gait Ambulation/Gait Assistance: Not tested (comment)    Exercises General Exercises - Lower Extremity Ankle Circles/Pumps: AROM;Both;10 reps;Supine Heel Slides: AROM;Both;10 reps;Supine Hip ABduction/ADduction: AROM;Both;10 reps;Supine Straight Leg Raises: AROM;Both;10 reps;Supine      PT Goals (current goals can now be found in the care plan section) Acute Rehab PT Goals Potential to Achieve Goals: Good  Visit Information  Last PT Received On: 01/13/13 Assistance Needed: +1 History of Present Illness: 79 yo female with CHF with HOCM presenting with anasarca and ARF in the setting of decompensated HF.    Subjective Data      Cognition  Cognition Arousal/Alertness: Awake/alert Behavior During Therapy: WFL for tasks assessed/performed (appeared very tired) Overall Cognitive Status: Within Functional Limits for tasks assessed    Balance     End of Session PT - End of Session Equipment Utilized During Treatment: Gait belt Activity Tolerance: Patient limited by fatigue Patient left: in chair;with call bell/phone within reach;with family/visitor present Nurse Communication: Mobility status;Precautions   GP  [name], SPTA 01/13/2013, 2:32 PM     [name], [place] [phone] 01/13/2013

## 2013-01-13 NOTE — Clinical Social Work Note (Signed)
Clinical Social Worker submitted [employer] clinicals for prior approval request for SNF placement at discharge.   Rozetta Nunnery MSW, [employer] [phone]

## 2013-01-13 NOTE — Progress Notes (Signed)
DAILY PROGRESS NOTE  Subjective:  No events last night. Had successful placement of left upper arm fistula and right IJ hemodialysis catheter yesterday. No chest pain or worsening shortness of breath.  Objective:  Temp:  [96.8 F (36 C)-98.6 F (37 C)] 96.8 F (36 C) (07/01 0738) Pulse Rate:  [73-95] 82 (07/01 1000) Resp:  [14-26] 20 (07/01 1000) BP: (87-132)/(33-77) 93/52 mmHg (07/01 1000) SpO2:  [92 %-100 %] 99 % (07/01 0738) Weight:  [212 lb 1.3 oz (96.2 kg)-215 lb 9.8 oz (97.8 kg)] 215 lb 9.8 oz (97.8 kg) (07/01 0738) Weight change: 3 lb 15.5 oz (1.8 kg)  Intake/Output from previous day: 06/30 0701 - 07/01 0700 In: 1873.7 [P.O.:480; I.V.:890.7; IV Piggyback:503] Out: 160 [Urine:160]  Intake/Output from this shift: Total I/O In: 29 [I.V.:29] Out: -   Medications: Current Facility-Administered Medications  Medication Dose Route Frequency Provider Last Rate Last Dose  . 0.9 %  sodium chloride infusion  250 mL Intravenous PRN Brittainy Simmons, PA-C 10 mL/hr at 01/12/13 1900 250 mL at 01/12/13 1900  . 0.9 %  sodium chloride infusion  100 mL Intravenous PRN Lauris Poag, MD      . 0.9 %  sodium chloride infusion  100 mL Intravenous PRN Lauris Poag, MD      . 0.9 %  sodium chloride infusion   Intravenous Continuous Judie Petit, MD 10 mL/hr at 01/12/13 1900    . acetaminophen (TYLENOL) tablet 650 mg  650 mg Oral Q4H PRN Brittainy Simmons, PA-C      . allopurinol (ZYLOPRIM) tablet 150 mg  150 mg Oral Daily Brittainy Simmons, PA-C   150 mg at 01/12/13 0855  . ALPRAZolam Prudy Feeler) tablet 0.25 mg  0.25 mg Oral BID PRN Brittainy Simmons, PA-C      . antiseptic oral rinse (BIOTENE) solution 15 mL  15 mL Mouth Rinse BID Chrystie Nose, MD   15 mL at 01/12/13 2004  . camphor-menthol (SARNA) lotion   Topical PRN Zigmund Gottron, MD      . doxycycline (VIBRA-TABS) tablet 100 mg  100 mg Oral Q12H Sadie Haber, MD   100 mg at 01/12/13 2154  . feeding supplement (NEPRO  CARB STEADY) liquid 237 mL  237 mL Oral PRN Lauris Poag, MD      . feeding supplement (RESOURCE BREEZE) liquid 1 Container  1 Container Oral BID BM Tonye Becket, RD      . ferric gluconate (NULECIT) 125 mg in sodium chloride 0.9 % 100 mL IVPB  125 mg Intravenous Q T,Th,Sa-HD Trevor Iha, MD   125 mg at 01/13/13 0944  . heparin ADULT infusion 100 units/mL (25000 units/250 mL)  1,450 Units/hr Intravenous Continuous Drake Leach Rumbarger, RPH 14.5 mL/hr at 01/12/13 1900 1,450 Units/hr at 01/12/13 1900  . heparin injection 1,000 Units  1,000 Units Dialysis PRN Lauris Poag, MD   2,000 Units at 01/12/13 1547  . heparin injection 1,900 Units  20 Units/kg Dialysis PRN Lauris Poag, MD   1,900 Units at 01/13/13 0730  . insulin aspart (novoLOG) injection 0-9 Units  0-9 Units Subcutaneous TID WC Chrystie Nose, MD   1 Units at 01/11/13 1748  . insulin aspart protamine- aspart (NOVOLOG 70/30) injection 10 Units  10 Units Subcutaneous Q breakfast Chrystie Nose, MD   10 Units at 01/11/13 [phone]  . insulin aspart protamine- aspart (NOVOLOG 70/30) injection 5 Units  5 Units Subcutaneous Q supper Chrystie Nose, MD  5 Units at 01/11/13 1749  . lidocaine (PF) (XYLOCAINE) 1 % injection 5 mL  5 mL Intradermal PRN Lauris Poag, MD      . lidocaine-prilocaine (EMLA) cream 1 application  1 application Topical PRN Lauris Poag, MD      . multivitamin (RENA-VIT) tablet 1 tablet  1 tablet Oral Daily Lauris Poag, MD   1 tablet at 01/11/13 [phone]  . ondansetron (ZOFRAN) injection 4 mg  4 mg Intravenous Q6H PRN Brittainy Simmons, PA-C      . oxyCODONE (Oxy IR/ROXICODONE) immediate release tablet 5-10 mg  5-10 mg Oral Q2H PRN Regina J Roczniak, PA-C      . pantoprazole (PROTONIX) EC tablet 40 mg  40 mg Oral Daily Brittainy Simmons, PA-C   40 mg at 01/12/13 0855  . pentafluoroprop-tetrafluoroeth (GEBAUERS) aerosol 1 application  1 application Topical PRN Lauris Poag, MD      . phenytoin (DILANTIN)  ER capsule 300 mg  300 mg Oral QHS Brittainy Simmons, PA-C   300 mg at 01/12/13 2154  . senna-docusate (Senokot-S) tablet 1 tablet  1 tablet Oral QHS PRN Robbie Lis, PA-C   1 tablet at 01/05/13 0958  . simvastatin (ZOCOR) tablet 10 mg  10 mg Oral QHS Brittainy Simmons, PA-C   10 mg at 01/12/13 2154  . sodium chloride 0.9 % injection 3 mL  3 mL Intravenous Q12H Brittainy Simmons, PA-C   3 mL at 01/12/13 2154  . sodium chloride 0.9 % injection 3 mL  3 mL Intravenous PRN Brittainy Simmons, PA-C      . Warfarin - Pharmacist Dosing Inpatient   Does not apply q1800 Chrystie Nose, MD      . zolpidem (AMBIEN) tablet 5 mg  5 mg Oral QHS PRN Robbie Lis, PA-C        Physical Exam: General appearance: alert and no distress Neck: no adenopathy, no carotid bruit, no JVD, supple, symmetrical, trachea midline and thyroid not enlarged, symmetric, no tenderness/mass/nodules Lungs: clear to auscultation bilaterally Heart: regular rate and rhythm, S1, S2 normal, 2/6 holosystolic murmur at the apex, no click, rub or gallop Abdomen: soft, non-tender; bowel sounds normal; no masses,  no organomegaly and obese Extremities: edema trace to 1+ bilateral LE edema, right leg is wrapped Pulses: 2+ and symmetric Skin: Skin color, texture, turgor normal. No rashes or lesions Neurologic: Grossly normal  Lab Results: Results for orders placed during the hospital encounter of 12/30/12 (from the past 48 hour(s))  GLUCOSE, CAPILLARY     Status: Abnormal   Collection Time    01/11/13 12:10 PM      Result Value Range   Glucose-Capillary 127 (*) 70 - 99 mg/dL  GLUCOSE, CAPILLARY     Status: Abnormal   Collection Time    01/11/13  4:55 PM      Result Value Range   Glucose-Capillary 134 (*) 70 - 99 mg/dL  GLUCOSE, CAPILLARY     Status: None   Collection Time    01/11/13  9:42 PM      Result Value Range   Glucose-Capillary 80  70 - 99 mg/dL  CBC     Status: Abnormal   Collection Time    01/12/13  5:00  AM      Result Value Range   WBC 12.2 (*) 4.0 - 10.5 K/uL   RBC 3.98  3.87 - 5.11 MIL/uL   Hemoglobin 12.1  12.0 - 15.0 g/dL   HCT 29.5  28.4 - 13.2 %  MCV 94.2  78.0 - 100.0 fL   MCH 30.4  26.0 - 34.0 pg   MCHC 32.3  30.0 - 36.0 g/dL   RDW 16.1 (*) 09.6 - 04.5 %   Platelets 258  150 - 400 K/uL  PROTIME-INR     Status: Abnormal   Collection Time    01/12/13  5:00 AM      Result Value Range   Prothrombin Time 18.4 (*) 11.6 - 15.2 seconds   INR 1.58 (*) 0.00 - 1.49  BASIC METABOLIC PANEL     Status: Abnormal   Collection Time    01/12/13  5:00 AM      Result Value Range   Sodium 137  135 - 145 mEq/L   Potassium 4.1  3.5 - 5.1 mEq/L   Chloride 99  96 - 112 mEq/L   CO2 27  19 - 32 mEq/L   Glucose, Bld 102 (*) 70 - 99 mg/dL   BUN 40 (*) 6 - 23 mg/dL   Creatinine, Ser 4.09 (*) 0.50 - 1.10 mg/dL   Calcium 8.7  8.4 - 81.1 mg/dL   GFR calc non Af Amer 25 (*) >90 mL/min   GFR calc Af Amer 29 (*) >90 mL/min   Comment:            The eGFR has been calculated     using the CKD EPI equation.     This calculation has not been     validated in all clinical     situations.     eGFR's persistently     <90 mL/min signify     possible Chronic Kidney Disease.  HEPARIN LEVEL (UNFRACTIONATED)     Status: None   Collection Time    01/12/13  5:00 AM      Result Value Range   Heparin Unfractionated 0.69  0.30 - 0.70 IU/mL   Comment:            IF HEPARIN RESULTS ARE BELOW     EXPECTED VALUES, AND PATIENT     DOSAGE HAS BEEN CONFIRMED,     SUGGEST FOLLOW UP TESTING     OF ANTITHROMBIN III LEVELS.  GLUCOSE, CAPILLARY     Status: None   Collection Time    01/12/13  8:18 AM      Result Value Range   Glucose-Capillary 98  70 - 99 mg/dL  IRON AND TIBC     Status: Abnormal   Collection Time    01/12/13  9:03 AM      Result Value Range   Iron 67  42 - 135 ug/dL   TIBC 914 (*) 782 - 956 ug/dL   Saturation Ratios 28  20 - 55 %   UIBC 176  125 - 400 ug/dL  GLUCOSE, CAPILLARY     Status:  Abnormal   Collection Time    01/12/13 12:50 PM      Result Value Range   Glucose-Capillary 101 (*) 70 - 99 mg/dL   Comment 1 Notify RN    GLUCOSE, CAPILLARY     Status: None   Collection Time    01/12/13  5:09 PM      Result Value Range   Glucose-Capillary 88  70 - 99 mg/dL  GLUCOSE, CAPILLARY     Status: Abnormal   Collection Time    01/12/13  9:23 PM      Result Value Range   Glucose-Capillary 134 (*) 70 - 99 mg/dL  CBC  Status: Abnormal   Collection Time    01/13/13  3:44 AM      Result Value Range   WBC 14.7 (*) 4.0 - 10.5 K/uL   RBC 3.65 (*) 3.87 - 5.11 MIL/uL   Hemoglobin 11.2 (*) 12.0 - 15.0 g/dL   HCT 40.9 (*) 81.1 - 91.4 %   MCV 96.2  78.0 - 100.0 fL   MCH 30.7  26.0 - 34.0 pg   MCHC 31.9  30.0 - 36.0 g/dL   RDW 78.2 (*) 95.6 - 21.3 %   Platelets 183  150 - 400 K/uL   Comment: DELTA CHECK NOTED  HEPARIN LEVEL (UNFRACTIONATED)     Status: Abnormal   Collection Time    01/13/13  3:44 AM      Result Value Range   Heparin Unfractionated >2.00 (*) 0.30 - 0.70 IU/mL   Comment:            IF HEPARIN RESULTS ARE BELOW     EXPECTED VALUES, AND PATIENT     DOSAGE HAS BEEN CONFIRMED,     SUGGEST FOLLOW UP TESTING     OF ANTITHROMBIN III LEVELS.  RENAL FUNCTION PANEL     Status: Abnormal   Collection Time    01/13/13  3:44 AM      Result Value Range   Sodium 136  135 - 145 mEq/L   Potassium 4.3  3.5 - 5.1 mEq/L   Chloride 100  96 - 112 mEq/L   CO2 24  19 - 32 mEq/L   Glucose, Bld 110 (*) 70 - 99 mg/dL   BUN 44 (*) 6 - 23 mg/dL   Creatinine, Ser 0.86 (*) 0.50 - 1.10 mg/dL   Calcium 8.2 (*) 8.4 - 10.5 mg/dL   Phosphorus 5.1 (*) 2.3 - 4.6 mg/dL   Albumin 2.2 (*) 3.5 - 5.2 g/dL   GFR calc non Af Amer 24 (*) >90 mL/min   GFR calc Af Amer 28 (*) >90 mL/min   Comment:            The eGFR has been calculated     using the CKD EPI equation.     This calculation has not been     validated in all clinical     situations.     eGFR's persistently     <90 mL/min  signify     possible Chronic Kidney Disease.  PROTIME-INR     Status: Abnormal   Collection Time    01/13/13  3:44 AM      Result Value Range   Prothrombin Time 24.5 (*) 11.6 - 15.2 seconds   INR 2.29 (*) 0.00 - 1.49  APTT     Status: Abnormal   Collection Time    01/13/13  7:43 AM      Result Value Range   aPTT >200 (*) 24 - 37 seconds   Comment:            IF BASELINE aPTT IS ELEVATED,     SUGGEST PATIENT RISK ASSESSMENT     BE USED TO DETERMINE APPROPRIATE     ANTICOAGULANT THERAPY.     REPEATED TO VERIFY     CRITICAL RESULT CALLED TO, READ BACK BY AND VERIFIED WITH:     L.FLADGER,RN 01/13/13 0937 BY BSLADE  GLUCOSE, CAPILLARY     Status: Abnormal   Collection Time    01/13/13  8:03 AM      Result Value Range   Glucose-Capillary 113 (*) 70 -  99 mg/dL    Imaging: Dg Chest Port 1 View  01/12/2013   *RADIOLOGY REPORT*  Clinical Data: 79 year old female dialysis catheter placement.  PORTABLE CHEST - 1 VIEW  Comparison: 12/30/2012 and earlier.  Findings: Portable AP view at 1307 hours.  Right IJ approach dual lumen dialysis type catheter in place.  Tips project at the lower SVC and superior right atrium level.  Lower lung volumes.  The patient is more rotated to the left.  Increased pulmonary vascular congestion.  No pneumothorax identified.  Possible small pleural effusions.  IMPRESSION: 1.  Right IJ dialysis catheter placed with no adverse features. 2.  Increased pulmonary vascular congestion and probable small pleural effusions.   Original Report Authenticated By: Erskine Speed, M.D.   Dg Fluoro Guide Cv Line-no Report  01/12/2013   CLINICAL DATA: Exchange Dialysis Catheter   FLOURO GUIDE CV LINE  Fluoroscopy was utilized by the requesting physician.  No radiographic  interpretation.     Assessment:  1. Principal Problem: 2.   Acute on chronic diastolic CHF (congestive heart failure), NYHA class 3, 09/25/12 3. Active Problems: 4.   Sleep apnea on c-pap  5.   Aortic  insufficiency: mild 6.   Chronic venous insufficiency with LE ulcers, will be followed at wound center 7.   Diabetes mellitus type 2 in obese 8.   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   History of Rt brain CVA 10.   PAF (paroxysmal atrial fibrillation),  11.   Long term (current) use of anticoagulants 12.   Chronic kidney disease (CKD), stage IV (severe), now with HD 13.   Cardiorenal syndrome with renal failure 14.   Cardiogenic shock 15.   Plan:  1. [name] did well with her vascular procedures yesterday. She is tolerating dialysis today and maintaining an adequate blood pressure. ?disposition soon - I suspect she will need rehabilitation due to extended hospital stay. Will ask PT and social work to see and evaluate for recommendations.  Time Spent Directly with Patient:  15 minutes  Length of Stay:  LOS: 14 days   Chrystie Nose, MD, [facility] Attending Cardiologist [facility]  HILTY,Kenneth C 01/13/2013, 10:13 AM

## 2013-01-13 NOTE — Progress Notes (Signed)
Subjective: Interval History: none.  Objective: Vital signs in last 24 hours: Temp:  [96.8 F (36 C)-98.6 F (37 C)] 96.8 F (36 C) (07/01 0738) Pulse Rate:  [73-95] 88 (07/01 0830) Resp:  [14-26] 16 (07/01 0830) BP: (88-132)/(33-77) 88/61 mmHg (07/01 0830) SpO2:  [92 %-100 %] 99 % (07/01 0738) Weight:  [96.2 kg (212 lb 1.3 oz)-97.8 kg (215 lb 9.8 oz)] 97.8 kg (215 lb 9.8 oz) (07/01 0738) Weight change: 1.8 kg (3 lb 15.5 oz)  Intake/Output from previous day: 06/30 0701 - 07/01 0700 In: 1859.2 [P.O.:480; I.V.:876.2; IV Piggyback:503] Out: 160 [Urine:160] Intake/Output this shift:    General appearance: cooperative, moderately obese and slowed mentation Resp: diminished breath sounds bilaterally and rales bibasilar Chest wall: R IJ cath Cardio: regular rate and rhythm and systolic murmur: holosystolic 2/6, blowing at apex GI: obese, pos bs, liver down 5 cm Extremities: Homans sign is negative, no sign of DVT 3 + edema  Lab Results:  Recent Labs  01/12/13 0500 01/13/13 0344  WBC 12.2* 14.7*  HGB 12.1 11.2*  HCT 37.5 35.1*  PLT 258 183   BMET:  Recent Labs  01/12/13 0500 01/13/13 0344  NA 137 136  K 4.1 4.3  CL 99 100  CO2 27 24  GLUCOSE 102* 110*  BUN 40* 44*  CREATININE 1.87* 1.94*  CALCIUM 8.7 8.2*   No results found for this basename: PTH,  in the last 72 hours Iron Studies:  Recent Labs  01/12/13 0903  IRON 67  TIBC 243*    Studies/Results: Dg Chest Port 1 View  01/12/2013   *RADIOLOGY REPORT*  Clinical Data: 79 year old female dialysis catheter placement.  PORTABLE CHEST - 1 VIEW  Comparison: 12/30/2012 and earlier.  Findings: Portable AP view at 1307 hours.  Right IJ approach dual lumen dialysis type catheter in place.  Tips project at the lower SVC and superior right atrium level.  Lower lung volumes.  The patient is more rotated to the left.  Increased pulmonary vascular congestion.  No pneumothorax identified.  Possible small pleural effusions.   IMPRESSION: 1.  Right IJ dialysis catheter placed with no adverse features. 2.  Increased pulmonary vascular congestion and probable small pleural effusions.   Original Report Authenticated By: Erskine Speed, M.D.   Dg Fluoro Guide Cv Line-no Report  01/12/2013   CLINICAL DATA: Exchange Dialysis Catheter   FLOURO GUIDE CV LINE  Fluoroscopy was utilized by the requesting physician.  No radiographic  interpretation.     I have reviewed the patient's current medications.  Assessment/Plan: 1 CRF has some GFR, not able to clear vol. Needs chronic HD.  AVF marginal. Vol xs. 2 Anemia Fe ok. 3 DM controlled 4 OSA 5 Obesity 6 CVA 7 HPTH p P HD, Dispo, mobilize   LOS: 14 days   Lugene Hitt L 01/13/2013,8:53 AM

## 2013-01-13 NOTE — Procedures (Signed)
I was present at this session.  I have reviewed the session itself and made appropriate changes.  Cath working well today. Vol xs but bp marginal.  Faron Tudisco L 7/1/20148:53 AM

## 2013-01-13 NOTE — Progress Notes (Signed)
RT note, late entry: Shortly after placing pt. On CPAP RT received call from RN stating that pt. Refuses to wear CPAP at this time. Pt. Was taken off CPAP & placed back on O2 by RN.

## 2013-01-13 NOTE — Progress Notes (Signed)
Night Cardiology [facility] due to low blood pressures in the 70s systolic. Reportedly, during hemodialysis today, first in 3 days after new catheter placed yesterday, was also hypotensive requiring midodrine administration. 2500 taken off during dialysis. Given 200 ml NS bolus, without response. Dopamine started due to persistently low blood pressure. Noted that BP cuff is on R forearm- ?accuracy?.  History includes pulmonary HTN (55 by echo), severe LVH with outflow obstructing physiology, gradient of 70 mmHg. Also with paroxysmal afib. Likely related to fluid removal during dialysis in a pt extremely sensitive to volume (pulm htn, HOCM). Will give back another 250 ml and try to minimize dopamine.  Dopamine may be less than ideal given her HOCM as an increase in contractility may result in worsening outflow obstruction. Will consider change to more pure vasoconstrictor if needed though peripheral access only at this point.

## 2013-01-13 NOTE — Progress Notes (Signed)
  VASCULAR AND VEIN SURGERY PROGRESS NOTE  POST-OP HEMODIALYSIS ACCESS  Date of Surgery: 12/30/2012 - 01/12/2013 Surgeon: Moishe Spice): Larina Earthly, MD 1 Day Post-Op Left  ARTERIOVENOUS (AV) FISTULA CREATION INSERTION OF DIALYSIS CATHETER   HPI: [name] is a 79 y.o. female who is 1 Day Post-Op creation/revision of left upper extremity Hemodialysis access. The patient denies symptoms of numbness, tingling, weakness; complains of pain in the operative limb at incision   Significant Diagnostic Studies: CBC Lab Results  Component Value Date   WBC 14.7* 01/13/2013   HGB 11.2* 01/13/2013   HCT 35.1* 01/13/2013   MCV 96.2 01/13/2013   PLT 183 01/13/2013    BMET    Component Value Date/Time   NA 136 01/13/2013 0344   K 4.3 01/13/2013 0344   CL 100 01/13/2013 0344   CO2 24 01/13/2013 0344   GLUCOSE 110* 01/13/2013 0344   BUN 44* 01/13/2013 0344   CREATININE 1.94* 01/13/2013 0344   CREATININE 1.87* 11/26/2012 1200   CALCIUM 8.2* 01/13/2013 0344   GFRNONAA 24* 01/13/2013 0344   GFRAA 28* 01/13/2013 0344    COAG Lab Results  Component Value Date   INR 2.29* 01/13/2013   INR 1.58* 01/12/2013   INR 1.85* 01/11/2013   No results found for this basename: PTT    Vital Signs  BP Readings from Last 3 Encounters:  01/13/13 102/78  01/13/13 102/78  12/23/12 112/56   Temp Readings from Last 3 Encounters:  01/13/13 98.2 F (36.8 C) Oral  01/13/13 98.2 F (36.8 C) Oral  12/07/12 97.3 F (36.3 C) Oral   SpO2 Readings from Last 3 Encounters:  01/13/13 95%  01/13/13 95%  12/07/12 99%   Pulse Readings from Last 3 Encounters:  01/13/13 82  01/13/13 82  12/23/12 76     Physical Examination  left upper Incision is clean, dry, intact, Ecchymosis at [facility] near incision hand grip is 5/5, sensation in digits is intact;  There is a good thrill and good bruit in the left AVF.  Assessment/Plan [name] is a 79 y.o. year old female who presents s/p creation/revision of left upper extremity  Hemodialysis access. Will recheck wound tomorrow Follow-up in 4-6 weeks  The patient's access will be ready for use in 12 weeks.  Xavius Spadafore J 01/13/2013 12:01 PM

## 2013-01-13 NOTE — Progress Notes (Signed)
DR paged re persistently low b/p's ( 70's - 80's / 40's ,map in 23's)

## 2013-01-13 NOTE — Progress Notes (Signed)
ANTICOAGULATION CONSULT NOTE - Follow Up Consult  Pharmacy Consult for heparin>>coumadin  Indication: Hx DVT, afib  No Known Allergies  Patient Measurements: Height: 5\' 5"  (165.1 cm) Weight: 215 lb 9.8 oz (97.8 kg) IBW/kg (Calculated) : 57 Heparin Dosing Weight: 78.3kg  Vital Signs: Temp: 96.8 F (36 C) (07/01 0738) Temp src: Oral (07/01 0738) BP: 93/52 mmHg (07/01 1000) Pulse Rate: 82 (07/01 1000)  Labs:  Recent Labs  01/11/13 0319 01/11/13 0320 01/11/13 0909 01/12/13 0500 01/13/13 0344 01/13/13 0743  HGB 11.9*  --   --  12.1 11.2*  --   HCT 36.9  --   --  37.5 35.1*  --   PLT 220  --   --  258 183  --   APTT  --   --   --   --   --  >200*  LABPROT  --   --  20.8* 18.4* 24.5*  --   INR  --   --  1.85* 1.58* 2.29*  --   HEPARINUNFRC  --  0.58  --  0.69 >2.00*  --   CREATININE 1.82*  --   --  1.87* 1.94*  --    Estimated Creatinine Clearance: 28.1 ml/min (by C-G formula based on Cr of 1.94).  Assessment: 34 yof continues on IV heparin this morning at 1450 uts/hr HL, coumadin was restarted post permanent HD cath placement yesterday.  No noted bleeding complications but her coag studies are very elevated this morning. AM HL was >2 (drawn same arm as heparin but repeat aPTT was >200. INR also significantly increased overnight 1.58>>2.29? Patient missed three doses 6/27-29 and received 2.5 of vitamin K on 6/30 so unsure how INR has responded overnight. Diet fairly stable except for 6/29, would not expect doxy to play role in this given she has been on abx since 6/18.   D/w Brittainy with [facility] and given elevated coags/INR will d/c heparin bridge and continue with low dose of warfarin tonight. I have also notified the HD nurse who had called d/t panic lab value.  Goal of Therapy:  INR 2-3 Monitor platelets by anticoagulation protocol: Yes  Plan:  1. Stop heparin for now given INR>2 2. Monitor for s/s of bleeding 3. Warfarin 1mg  tonight 4. Daily INR  Sheppard Coil  PharmD., BCPS Clinical Pharmacist Pager [phone] 01/13/2013 10:33 AM

## 2013-01-13 NOTE — Progress Notes (Signed)
Dr Darrick Penna notified of gross edema, ecchymosis,,erythema hematoma and ,increased warmed to left UE AVG site placed on 6.30.14. Faint thrill and bruit.  Doppler radial pulse.  Denies Numbness, tingling to LUE.  States painful site to touch.

## 2013-01-14 ENCOUNTER — Inpatient Hospital Stay (HOSPITAL_COMMUNITY): Payer: Medicare Other

## 2013-01-14 ENCOUNTER — Telehealth: Payer: Self-pay | Admitting: *Deleted

## 2013-01-14 ENCOUNTER — Encounter (HOSPITAL_COMMUNITY): Payer: Self-pay | Admitting: Vascular Surgery

## 2013-01-14 DIAGNOSIS — I959 Hypotension, unspecified: Secondary | ICD-10-CM | POA: Diagnosis present

## 2013-01-14 DIAGNOSIS — D62 Acute posthemorrhagic anemia: Secondary | ICD-10-CM | POA: Diagnosis not present

## 2013-01-14 LAB — CBC
HCT: 26.9 % — ABNORMAL LOW (ref 36.0–46.0)
HCT: 24 % — ABNORMAL LOW (ref 36.0–46.0)
Hemoglobin: 8.6 g/dL — ABNORMAL LOW (ref 12.0–15.0)
Hemoglobin: 7.9 g/dL — ABNORMAL LOW (ref 12.0–15.0)
MCH: 31.3 pg (ref 26.0–34.0)
MCH: 30.9 pg (ref 26.0–34.0)
MCHC: 32.6 g/dL (ref 30.0–36.0)
MCHC: 32 g/dL (ref 30.0–36.0)
MCHC: 32.9 g/dL (ref 30.0–36.0)
MCV: 96 fL (ref 78.0–100.0)
Platelets: 138 10*3/uL — ABNORMAL LOW (ref 150–400)
RDW: 17.3 % — ABNORMAL HIGH (ref 11.5–15.5)
RDW: 17.2 % — ABNORMAL HIGH (ref 11.5–15.5)
WBC: 25.1 10*3/uL — ABNORMAL HIGH (ref 4.0–10.5)

## 2013-01-14 LAB — URINALYSIS, ROUTINE W REFLEX MICROSCOPIC
Nitrite: NEGATIVE
Protein, ur: NEGATIVE mg/dL
Specific Gravity, Urine: 1.019 (ref 1.005–1.030)
Urobilinogen, UA: 0.2 mg/dL (ref 0.0–1.0)

## 2013-01-14 LAB — GLUCOSE, CAPILLARY
Glucose-Capillary: 184 mg/dL — ABNORMAL HIGH (ref 70–99)
Glucose-Capillary: 89 mg/dL (ref 70–99)
Glucose-Capillary: 124 mg/dL — ABNORMAL HIGH (ref 70–99)

## 2013-01-14 LAB — URINE MICROSCOPIC-ADD ON

## 2013-01-14 MED ORDER — WARFARIN SODIUM 3 MG PO TABS
3.0000 mg | ORAL_TABLET | Freq: Once | ORAL | Status: AC
  Administered 2013-01-14: 3 mg via ORAL
  Filled 2013-01-14: qty 1

## 2013-01-14 MED ORDER — DARBEPOETIN ALFA-POLYSORBATE 200 MCG/0.4ML IJ SOLN
200.0000 ug | INTRAMUSCULAR | Status: DC
Start: 2013-01-15 — End: 2013-01-23
  Administered 2013-01-15: 200 ug via INTRAVENOUS
  Filled 2013-01-14 (×3): qty 0.4

## 2013-01-14 MED ORDER — MIDODRINE HCL 5 MG PO TABS
10.0000 mg | ORAL_TABLET | Freq: Once | ORAL | Status: AC
  Administered 2013-01-14: 10 mg via ORAL
  Filled 2013-01-14: qty 2

## 2013-01-14 MED ORDER — SODIUM CHLORIDE 0.9 % IV BOLUS (SEPSIS)
250.0000 mL | Freq: Once | INTRAVENOUS | Status: AC
  Administered 2013-01-14: 250 mL via INTRAVENOUS

## 2013-01-14 MED ORDER — SODIUM CHLORIDE 0.9 % IV BOLUS (SEPSIS)
100.0000 mL | Freq: Once | INTRAVENOUS | Status: AC
  Administered 2013-01-14: 100 mL via INTRAVENOUS

## 2013-01-14 MED ORDER — MIDODRINE HCL 5 MG PO TABS
10.0000 mg | ORAL_TABLET | Freq: Three times a day (TID) | ORAL | Status: DC
  Administered 2013-01-14 (×2): 10 mg via ORAL
  Filled 2013-01-14 (×6): qty 2

## 2013-01-14 NOTE — Progress Notes (Signed)
Subjective: Interval History: none.  Objective: Vital signs in last 24 hours: Temp:  [97.3 F (36.3 C)-98.2 F (36.8 C)] 97.3 F (36.3 C) (07/02 0749) Pulse Rate:  [78-93] 78 (07/02 0353) Resp:  [12-34] 18 (07/02 0353) BP: (51-123)/(10-78) 101/21 mmHg (07/02 0330) SpO2:  [95 %-100 %] 100 % (07/02 0353) Weight:  [95.1 kg (209 lb 10.5 oz)-95.5 kg (210 lb 8.6 oz)] 95.5 kg (210 lb 8.6 oz) (07/02 0353) Weight change: 0.6 kg (1 lb 5.2 oz)  Intake/Output from previous day: 07/01 0701 - 07/02 0700 In: 1364.5 [P.O.:860; I.V.:54.5; IV Piggyback:450] Out: 2600 [Urine:100] Intake/Output this shift:    General appearance: cooperative and slowed mentation Resp: diminished breath sounds bilaterally Cardio: S1, S2 normal and systolic murmur: holosystolic 3/6, blowing at apex GI: obese,pos bs, liver down 5 cm Extremities: edema 2+ and AVF LUA, / Hematoma  Lab Results:  Recent Labs  01/14/13 0435 01/14/13 0705  WBC 23.1* 22.9*  HGB 8.6* 8.6*  HCT 26.4* 26.9*  PLT 138* 144*   BMET:  Recent Labs  01/12/13 0500 01/13/13 0344  NA 137 136  K 4.1 4.3  CL 99 100  CO2 27 24  GLUCOSE 102* 110*  BUN 40* 44*  CREATININE 1.87* 1.94*  CALCIUM 8.7 8.2*    Recent Labs  01/12/13 0903  PTH 120.1*   Iron Studies:  Recent Labs  01/12/13 0903  IRON 67  TIBC 243*    Studies/Results: Dg Chest Port 1 View  01/12/2013   *RADIOLOGY REPORT*  Clinical Data: 79 year old female dialysis catheter placement.  PORTABLE CHEST - 1 VIEW  Comparison: 12/30/2012 and earlier.  Findings: Portable AP view at 1307 hours.  Right IJ approach dual lumen dialysis type catheter in place.  Tips project at the lower SVC and superior right atrium level.  Lower lung volumes.  The patient is more rotated to the left.  Increased pulmonary vascular congestion.  No pneumothorax identified.  Possible small pleural effusions.  IMPRESSION: 1.  Right IJ dialysis catheter placed with no adverse features. 2.  Increased  pulmonary vascular congestion and probable small pleural effusions.   Original Report Authenticated By: Erskine Speed, M.D.   Dg Fluoro Guide Cv Line-no Report  01/12/2013   CLINICAL DATA: Exchange Dialysis Catheter   FLOURO GUIDE CV LINE  Fluoroscopy was utilized by the requesting physician.  No radiographic  interpretation.     I have reviewed the patient's current medications.  Assessment/Plan: 1 CRF vol xs but low bp, will limit removal 2 Anemia blood loss ? LUA 3 DM controlled 4 Low bp suspect blood loss and cardiac not tol vol off at HD despite vol xs. 5 HTPH 6 CAD 7 COPD/OSA refusing mask 8 Obesity P HD,epo, follow Hb, follow arm, midodrine    LOS: 15 days   Tally Mckinnon L 01/14/2013,9:02 AM

## 2013-01-14 NOTE — Progress Notes (Signed)
Patient ID: [name], female   DOB: 10-05-1933, 79 y.o.   MRN: [id number] Patient reports discomfort in the antecubital space of her left arm. No hand none was numbness, tingling or other evidence of steal. She does have a audible bruit over her upper arm AV fistula. She does not have any evidence of a false aneurysm but does have a significant hematoma in an cubital space. Most likely related to chronic anticoagulation therapy. No evidence of skin breakdown. Explained that this will continue to resolve and she resorbs the blood.

## 2013-01-14 NOTE — Progress Notes (Addendum)
ANTICOAGULATION CONSULT NOTE - Follow Up Consult  Pharmacy Consult for Coumadin Indication: Hx DVT, afib  No Known Allergies  Labs:  Recent Labs  01/12/13 0500 01/13/13 0344 01/13/13 0743 01/14/13 0435 01/14/13 0705  HGB 12.1 11.2*  --  8.6* 8.6*  HCT 37.5 35.1*  --  26.4* 26.9*  PLT 258 183  --  138* 144*  APTT  --   --  >200*  --   --   LABPROT 18.4* 24.5*  --  19.1*  --   INR 1.58* 2.29*  --  1.66*  --   HEPARINUNFRC 0.69 >2.00*  --   --   --   CREATININE 1.87* 1.94*  --   --   --    Estimated Creatinine Clearance: 27.8 ml/min (by C-G formula based on Cr of 1.94).  Assessment: 79 year old female continue on Coumadin for DVT history, Afib.  S/p HD catheter placement, INR = 1.66 (believe labs yesterday drawn incorrectly).  CBC significantly decreased this AM (?bleeding at cath site), very tired this AM.  Goal of Therapy:  INR 2-3 Monitor platelets by anticoagulation protocol: Yes  Plan:  1. Heparin on hold for now 2. Monitor for s/s of bleeding 3. Warfarin 3 mg tonight 4. Daily INR  Thank you. Okey Regal, PharmD [phone]  01/14/2013 8:35 AM

## 2013-01-14 NOTE — Progress Notes (Signed)
PT Cancellation Note  Patient Details Name: DELANEE XIN MRN: [id number] DOB: 11-Jul-1934   Cancelled Treatment:    Reason Eval/Treat Not Completed: Patient not medically ready. RN asked to hold due to pt's BP at 98/41 currently lying supine in bed. RN reports pt's BP to be low all day 51/10, 76/38. PT to return as able.   Marcene Brawn 01/14/2013, 4:38 PM

## 2013-01-14 NOTE — Clinical Social Work Note (Addendum)
Clinical Social Worker met with patient and spoke with husband regarding confirmation of SNF. Patient reported that [facility] was preferred, but also stated that there was another facility that was mentioned, but she could not remember the name of that facility. Patient gave CSW permission to speak with husband to confirm SNF placement, and husband reported that [facility] is the confirmed facility. CSW spoke with facility to inform of confirmation and they reported that they will need to know the name of the HD center she will be going to and that they do not transport patient to HD. CSW called husband and relayed information to husband and he reported that he will be able to transport patient if needed. CSW will continue to follow.   14:10pm CSW received voice message from husband and CSW returned call. Husband stated that he would like to look into facilities that will transport patient to HD. Husband reported that he will have difficulty transporting patient. Husband was in room with patient and reported that he is interested in [facility], if they can transport patient. CSW will speak with [facility] facility to see if that is something they can accommodate.   14:30pm CSW spoke with admissions coordinator at [facility] and they do transport to HD. CSW informed husband and patient and they are agreeable to proceed with this facility, [facility]. CSW notified previously confirmed facility, [facility] that patient is confirming a different facility.   Rozetta Nunnery MSW, [employer] [phone]

## 2013-01-14 NOTE — Progress Notes (Signed)
RT note, late entry: Pt. Refuses to wear CPAP at this time. Pt. Was made aware to let RT know if she changes her mind anytime during the night & decides to wear CPAP.

## 2013-01-14 NOTE — Progress Notes (Signed)
Pt BP 76/31. Notified Nada Boozer PA-C. Orders received.NS 100cc given. Barbera Setters

## 2013-01-14 NOTE — Telephone Encounter (Signed)
Faxed paperwork (Medical Update, Plan of Care) to [facility] on 01/14/2013

## 2013-01-14 NOTE — Progress Notes (Addendum)
Subjective: Arm swelling at cath site, hypotensive during the night Very tired this AM, no SOB no chest pain.  Objective: Vital signs in last 24 hours: Temp:  [97.3 F (36.3 C)-98.2 F (36.8 C)] 97.3 F (36.3 C) (07/02 0749) Pulse Rate:  [78-93] 78 (07/02 0353) Resp:  [12-34] 18 (07/02 0353) BP: (51-123)/(10-78) 101/21 mmHg (07/02 0330) SpO2:  [95 %-100 %] 100 % (07/02 0353) Weight:  [209 lb 10.5 oz (95.1 kg)-210 lb 8.6 oz (95.5 kg)] 210 lb 8.6 oz (95.5 kg) (07/02 0353) Weight change: 1 lb 5.2 oz (0.6 kg) Last BM Date: 01/12/13 Intake/Output from previous day: -1221 (-13823 since admit) wt 210.8 up from 209 post dialysis but overall down from 46 pounds since admit 07/01 0701 - 07/02 0700 In: 1364.5 [P.O.:860; I.V.:54.5; IV Piggyback:450] Out: 2600 [Urine:100] Intake/Output this shift:    PE: General:Pleasant affect, NAD Skin:Warm and dry, brisk capillary refill HEENT:normocephalic, sclera clear, mucus membranes moist Neck:supple, no JVD, no bruits  Heart:S1S2 RRR without murmur, gallup, rub or click Lungs:clear without rales, rhonchi, or wheezes RUE:AVWU, non tender, + BS, do not palpate liver spleen or masses Ext:no lower ext edema, legs wrapped.  Lt arm with edema at elbow, very painful, + pulse at graft, ? Radial pulse Neuro:alert and oriented, MAE, follows commands, + facial symmetry   Lab Results:  Recent Labs  01/14/13 0435 01/14/13 0705  WBC 23.1* 22.9*  HGB 8.6* 8.6*  HCT 26.4* 26.9*  PLT 138* 144*   BMET  Recent Labs  01/12/13 0500 01/13/13 0344  NA 137 136  K 4.1 4.3  CL 99 100  CO2 27 24  GLUCOSE 102* 110*  BUN 40* 44*  CREATININE 1.87* 1.94*  CALCIUM 8.7 8.2*   No results found for this basename: TROPONINI, CK, MB,  in the last 72 hours  Lab Results  Component Value Date   CHOL 111 08/08/2012   HDL 30* 08/08/2012   LDLCALC 59 08/08/2012   TRIG 110 08/08/2012   CHOLHDL 3.7 08/08/2012   Lab Results  Component Value Date   HGBA1C 8.1*  08/08/2012     Lab Results  Component Value Date   TSH 0.992 09/25/2012    Hepatic Function Panel  Recent Labs  01/13/13 0344  ALBUMIN 2.2*     Studies/Results: Dg Chest Port 1 View  01/12/2013   *RADIOLOGY REPORT*  Clinical Data: 79 year old female dialysis catheter placement.  PORTABLE CHEST - 1 VIEW  Comparison: 12/30/2012 and earlier.  Findings: Portable AP view at 1307 hours.  Right IJ approach dual lumen dialysis type catheter in place.  Tips project at the lower SVC and superior right atrium level.  Lower lung volumes.  The patient is more rotated to the left.  Increased pulmonary vascular congestion.  No pneumothorax identified.  Possible small pleural effusions.  IMPRESSION: 1.  Right IJ dialysis catheter placed with no adverse features. 2.  Increased pulmonary vascular congestion and probable small pleural effusions.   Original Report Authenticated By: Erskine Speed, M.D.   Dg Fluoro Guide Cv Line-no Report  01/12/2013   CLINICAL DATA: Exchange Dialysis Catheter   FLOURO GUIDE CV LINE  Fluoroscopy was utilized by the requesting physician.  No radiographic  interpretation.     Medications: I have reviewed the patient's current medications. Scheduled Meds: . allopurinol  150 mg Oral Daily  . antiseptic oral rinse  15 mL Mouth Rinse BID  . doxycycline  100 mg Oral Q12H  . feeding supplement  1 Container  Oral BID BM  . ferric gluconate (FERRLECIT/NULECIT) IV  125 mg Intravenous Q T,Th,Sa-HD  . insulin aspart  0-9 Units Subcutaneous TID WC  . insulin aspart protamine- aspart  10 Units Subcutaneous Q breakfast  . insulin aspart protamine- aspart  5 Units Subcutaneous Q supper  . multivitamin  1 tablet Oral Daily  . pantoprazole  40 mg Oral Daily  . phenytoin  300 mg Oral QHS  . simvastatin  10 mg Oral QHS  . sodium chloride  3 mL Intravenous Q12H  . Warfarin - Pharmacist Dosing Inpatient   Does not apply q1800   Continuous Infusions: . sodium chloride 10 mL/hr at 01/12/13 1900   . DOPamine Stopped (01/14/13 0124)   PRN Meds:.sodium chloride, acetaminophen, ALPRAZolam, camphor-menthol, ondansetron (ZOFRAN) IV, oxyCODONE, senna-docusate, sodium chloride, zolpidem  Assessment/Plan: Principal Problem:   Acute on chronic diastolic CHF (congestive heart failure), NYHA class 3, 09/25/12 Active Problems:   Hypertrophic obstructive cardiomyopathy(425.11)   Cardiogenic shock   Acute blood loss anemia   Sleep apnea on c-pap    Chronic venous insufficiency with LE ulcers, will be followed at wound center   Diabetes mellitus type 2 in obese   Aortic insufficiency: mild   History of Rt brain CVA   PAF (paroxysmal atrial fibrillation),    Long term (current) use of anticoagulants   Chronic kidney disease (CKD), stage IV (severe), now with HD   Cardiorenal syndrome with renal failure   Hypotension  PLAN: BP down to 83 systolic, last pm, rec'd NS, then dopamine, but with midodrine BP improved.  + Arm swelling -Hematoma-at cath site with drop in H/H from 11.2/35.1 to 8.6/26.8 - rec'd 1 mg of coumadin yesterday. May need to hold coreg today.  Continue to monitor HGB  LOS: 15 days   Time spent with pt. :20 minutes. Pol.Mix R  Nurse Practitioner Certified Pager [phone] 01/14/2013, 8:31 AM  Was otherwise doing well s/p L Arm fistula placement, but was hypotensive during HD yesterday, then again o/n -- BB held & short course of Dopamine initiated.  Thankfully no further NSVT.  She seems very exhausted & less chipper today -- H/H dropped significantly & WBC is up. Afebrile.   IND was > 2 yesterday - ? If she bled @ Op site - arm is swollen & tender.   Would strongly consider at least 1 Unit PRBC with HD tomorrow. Check stool guiac (but op site bleed + dilution with IVF bolus more likley)  Will also check blood Cx (UA if making any urine) & recheck CXR - exam not very helpful.  Will continue to hold BB dose today - hope to restart tomorrow  Off of anticoagulation for  now until concern of bleed resolve.    Hopefullly, she will not require midodrine as standing medication.    Was hoping to have her up & mobilizing today, but las PM events preclude this.  Can be OOB- chair.  Glycemic control stable.  Marykay Lex, M.D., M.S. [facility] [address] [place], [place]  [address]  [phone] Pager # [phone] 01/14/2013 8:31 AM

## 2013-01-15 ENCOUNTER — Inpatient Hospital Stay (HOSPITAL_COMMUNITY): Payer: Medicare Other

## 2013-01-15 DIAGNOSIS — R609 Edema, unspecified: Secondary | ICD-10-CM

## 2013-01-15 DIAGNOSIS — I959 Hypotension, unspecified: Secondary | ICD-10-CM

## 2013-01-15 DIAGNOSIS — R578 Other shock: Secondary | ICD-10-CM

## 2013-01-15 DIAGNOSIS — M79609 Pain in unspecified limb: Secondary | ICD-10-CM

## 2013-01-15 LAB — GLUCOSE, CAPILLARY: Glucose-Capillary: 85 mg/dL (ref 70–99)

## 2013-01-15 LAB — RENAL FUNCTION PANEL
Albumin: 2.1 g/dL — ABNORMAL LOW (ref 3.5–5.2)
BUN: 45 mg/dL — ABNORMAL HIGH (ref 6–23)
Creatinine, Ser: 2.44 mg/dL — ABNORMAL HIGH (ref 0.50–1.10)
GFR calc non Af Amer: 18 mL/min — ABNORMAL LOW (ref >90)
Phosphorus: 5.2 mg/dL — ABNORMAL HIGH (ref 2.3–4.6)
Potassium: 4.4 mEq/L (ref 3.5–5.1)

## 2013-01-15 LAB — CBC
HCT: 25.3 % — ABNORMAL LOW (ref 36.0–46.0)
MCV: 96.2 fL (ref 78.0–100.0)
Platelets: 183 10*3/uL (ref 150–400)
RBC: 2.63 MIL/uL — ABNORMAL LOW (ref 3.87–5.11)
RDW: 17.3 % — ABNORMAL HIGH (ref 11.5–15.5)
WBC: 26.4 10*3/uL — ABNORMAL HIGH (ref 4.0–10.5)

## 2013-01-15 LAB — PROTIME-INR: INR: 2.05 — ABNORMAL HIGH (ref 0.00–1.49)

## 2013-01-15 LAB — PREPARE RBC (CROSSMATCH)

## 2013-01-15 LAB — ABO/RH: ABO/RH(D): A POS

## 2013-01-15 MED ORDER — ALTEPLASE 2 MG IJ SOLR: 2.0000 mg | Freq: Once | INTRAMUSCULAR | Status: AC | PRN

## 2013-01-15 MED ORDER — ALBUMIN HUMAN 25 % IV SOLN
INTRAVENOUS | Status: AC
  Filled 2013-01-15: qty 100

## 2013-01-15 MED ORDER — LIDOCAINE-PRILOCAINE 2.5-2.5 % EX CREA
1.0000 | TOPICAL_CREAM | CUTANEOUS | Status: DC | PRN
Start: 2013-01-15 — End: 2013-01-17

## 2013-01-15 MED ORDER — ALBUMIN HUMAN 25 % IV SOLN
12.5000 g | Freq: Once | INTRAVENOUS | Status: AC
  Administered 2013-01-15: 12.5 g via INTRAVENOUS

## 2013-01-15 MED ORDER — HEPARIN SODIUM (PORCINE) 1000 UNIT/ML DIALYSIS: 1000.0000 [IU] | INTRAMUSCULAR | Status: DC | PRN

## 2013-01-15 MED ORDER — LIDOCAINE HCL (PF) 1 % IJ SOLN
INTRAMUSCULAR | Status: AC
  Administered 2013-01-15: 5 mL
  Filled 2013-01-15: qty 5

## 2013-01-15 MED ORDER — PHENYLEPHRINE HCL 10 MG/ML IJ SOLN
30.0000 ug/min | INTRAVENOUS | Status: DC
  Administered 2013-01-15 (×3): 200 ug/min via INTRAVENOUS
  Administered 2013-01-16: 100 ug/min via INTRAVENOUS
  Administered 2013-01-16 (×2): 200 ug/min via INTRAVENOUS
  Filled 2013-01-15 (×10): qty 4

## 2013-01-15 MED ORDER — SODIUM CHLORIDE 0.9 % IV SOLN: 100.0000 mL | INTRAVENOUS | Status: DC | PRN

## 2013-01-15 MED ORDER — NEPRO/CARBSTEADY PO LIQD: 237.0000 mL | ORAL | Status: DC | PRN

## 2013-01-15 MED ORDER — PHENYLEPHRINE HCL 10 MG/ML IJ SOLN
30.0000 ug/min | INTRAVENOUS | Status: DC
  Administered 2013-01-15: 40 ug/min via INTRAVENOUS
  Filled 2013-01-15 (×2): qty 1

## 2013-01-15 MED ORDER — PENTAFLUOROPROP-TETRAFLUOROETH EX AERO: 1.0000 "application " | INHALATION_SPRAY | CUTANEOUS | Status: DC | PRN

## 2013-01-15 MED ORDER — LIDOCAINE HCL (PF) 1 % IJ SOLN: 5.0000 mL | INTRAMUSCULAR | Status: DC | PRN

## 2013-01-15 NOTE — Progress Notes (Signed)
ANTICOAGULATION CONSULT NOTE - Follow Up Consult  Pharmacy Consult for Coumadin Indication: Hx DVT, afib  No Known Allergies  Labs:  Recent Labs  01/13/13 0344 01/13/13 0743 01/14/13 0435 01/14/13 0705 01/14/13 1951 01/15/13 0940  HGB 11.2*  --  8.6* 8.6* 7.9* 8.2*  HCT 35.1*  --  26.4* 26.9* 24.0* 25.3*  PLT 183  --  138* 144* 161 183  APTT  --  >200*  --   --   --   --   LABPROT 24.5*  --  19.1*  --   --   --   INR 2.29*  --  1.66*  --   --   --   HEPARINUNFRC >2.00*  --   --   --   --   --   CREATININE 1.94*  --   --   --   --   --    Estimated Creatinine Clearance: 27.8 ml/min (by C-G formula based on Cr of 1.94).  Assessment: 79 year old female continue on Coumadin for DVT history, Afib.   HgB=8.2 this AM, but hypotensive and started on pressors Goal of Therapy:  INR 2-3 Monitor platelets by anticoagulation protocol: Yes  Plan:  1. Hold Coumadin for today 2. Re-assess in AM  Thank you. Okey Regal, PharmD [phone]  01/15/2013 10:14 AM

## 2013-01-15 NOTE — Progress Notes (Signed)
Accessed HD cath per order Dr Rennis Golden who consulted with Dr Detterding for IV access/labs. Heparin exchanaged to red port after blood , scant return noted from blue port but flushed easily.

## 2013-01-15 NOTE — Progress Notes (Signed)
Pt. Seen and examined. Agree with the NP/PA-C note as written.  She is somnolent this morning. Blood pressure continues to be low - systolics are in the 40's/50's - she is mentating, but somnolent. This is concerning for possible hypovolemic shock. She has little reserve given her  I'm not sure what her actual blood pressure is. Will start IV phenylephrine, due to her HOCM/small LV cavity features. Will need to get an arterial line for accurate blood pressure monitoring. Agree with transfusion (she is 7.9/24 yest) - lab was unable to get blood this morning. We need to access the dialysis cath to get labs +/- transfuse her. I spoke with Dr. Darrick Penna, plan for dialysis without volume removal - this may be due to blood loss around the fistula in the left arm.  CRITICAL CARE:  The patient is critically ill with multi-organ system failure and requires high complexity decision making for assessment and support, frequent evaluation and titration of therapies, application of advanced monitoring technologies and extensive interpretation of multiple databases.  Time spent on critical care directly with the patient: 60 minutes  Chrystie Nose, MD, [facility] Attending Cardiologist [facility]

## 2013-01-15 NOTE — Procedures (Signed)
PROCEDURE NOTE: L IJ CVL PLACEMENT  INDICATION:    Monitoring of central venous pressures and/or administration of medications optimally administered in central vein  CONSENT:   Risks of procedure as well as the alternatives were explained to the patient or surrogate. Consent for procedure obtained. A time out was performed to review patient identification, procedure to be performed, correct patient position, medications/allergies/relevent history, required imaging and test results.  PROCEDURE  Maximum sterile technique was used including antiseptics, cap, gloves, gown, hand hygiene, mask and sheet.  Skin prep: Chlorhexidine; local anesthetic administered  A antimicrobial bonded/coated triple lumen catheter was placed in the L IJ vein using the Seldinger technique.  Ultrasound was used for vessel identification and guidance.   EVALUATION:  Blood flow good  Complications: No apparent complications  Patient tolerated the procedure well.  Chest X-ray ordered to verify placement and is pending    Procedure was performed by ACNP Veleta Miners under my direct supervision  Billy Fischer, MD PCCM service Mobile [phone]

## 2013-01-15 NOTE — Progress Notes (Addendum)
VASCULAR PROGRESS NOTE  SUBJECTIVE: complains of some left upper arm pain.  PHYSICAL EXAM: Filed Vitals:   01/15/13 0920 01/15/13 1010 01/15/13 1025 01/15/13 1030  BP: 72/17 74/42    Pulse:  227 151 71  Temp:      TempSrc:      Resp: 22 15 18 19   Height:      Weight:      SpO2:  71% 65% 0%   Ecchymosis and swelling in the left upper arm along the medial aspect. The incision is intact. There is no significant swelling under the incision.  LABS: Lab Results  Component Value Date   WBC 26.4* 01/15/2013   HGB 8.2* 01/15/2013   HCT 25.3* 01/15/2013   MCV 96.2 01/15/2013   PLT 183 01/15/2013   Lab Results  Component Value Date   CREATININE 1.94* 01/13/2013   Lab Results  Component Value Date   INR 1.66* 01/14/2013   CBG (last 3)   Recent Labs  01/14/13 1755 01/14/13 2201 01/15/13 0723  GLUCAP 89 124* 114*    Principal Problem:   Acute on chronic diastolic CHF (congestive heart failure), NYHA class 3, 09/25/12 Active Problems:   Sleep apnea on c-pap    Aortic insufficiency: mild   Chronic venous insufficiency with LE ulcers, will be followed at wound center   Diabetes mellitus type 2 in obese   Hypertrophic obstructive cardiomyopathy(425.11)   History of Rt brain CVA   PAF (paroxysmal atrial fibrillation),    Long term (current) use of anticoagulants   Chronic kidney disease (CKD), stage IV (severe), now with HD   Cardiorenal syndrome with renal failure   Cardiogenic shock   Hypotension   Acute blood loss anemia   ASSESSMENT AND PLAN: 1. 3 Days Post-Op s/p: left brachiocephalic AV fistula. 2. I think it would be unusual for the blood to dissect up the entire arm if she were bleeding from her anastomosis. However, I will obtain a duplex to further evaluate the swelling.  Cari Caraway Beeper: [phone] 01/15/2013  Addendum: Duplex shows hematoma in upper arm. Not sure if this is related to the surgery at the antecubital level. I suppose blood could have dissected  proximally, but it seems unlikely. Will follow.   Waverly Ferrari, MD, FACS Beeper [phone] 01/15/2013

## 2013-01-15 NOTE — Progress Notes (Signed)
Pt clotted lines off with remaining in hemodialysis Tx. Unable to pull fluid during Tx due to low BP's. Pt maxed out on Neo. Ending up giving pt 950cc during hemodialysis Tx (pt got 2UPRB's). Dr. Helyn Numbers aware of Tx being stopped early due to clotting

## 2013-01-15 NOTE — Progress Notes (Signed)
[facility]  Subjective: No complaints. Pt is without symptoms.   Objective: Vital signs in last 24 hours: Temp:  [97.4 F (36.3 C)-97.8 F (36.6 C)] 97.8 F (36.6 C) (07/03 0721) Pulse Rate:  [67-200] 80 (07/03 0700) Resp:  [0-32] 24 (07/03 0700) BP: (74-118)/(18-69) 79/38 mmHg (07/03 0700) SpO2:  [60 %-100 %] 86 % (07/03 0700) Weight:  [211 lb 13.8 oz (96.1 kg)] 211 lb 13.8 oz (96.1 kg) (07/03 0500) Last BM Date: 01/12/13  Intake/Output from previous day: 07/02 0701 - 07/03 0700 In: 120 [P.O.:120] Out: 200 [Urine:200] Intake/Output this shift:    Medications Current Facility-Administered Medications  Medication Dose Route Frequency Provider Last Rate Last Dose  . 0.9 %  sodium chloride infusion  250 mL Intravenous PRN Brittainy Simmons, PA-C 10 mL/hr at 01/12/13 1900 250 mL at 01/12/13 1900  . 0.9 %  sodium chloride infusion   Intravenous Continuous Judie Petit, MD 10 mL/hr at 01/12/13 1900    . acetaminophen (TYLENOL) tablet 650 mg  650 mg Oral Q4H PRN Brittainy Simmons, PA-C      . allopurinol (ZYLOPRIM) tablet 150 mg  150 mg Oral Daily Brittainy Simmons, PA-C   150 mg at 01/14/13 0926  . ALPRAZolam Prudy Feeler) tablet 0.25 mg  0.25 mg Oral BID PRN Brittainy Simmons, PA-C      . antiseptic oral rinse (BIOTENE) solution 15 mL  15 mL Mouth Rinse BID Chrystie Nose, MD   15 mL at 01/14/13 2000  . camphor-menthol (SARNA) lotion   Topical PRN Zigmund Gottron, MD      . darbepoetin [facility]) injection 200 mcg  200 mcg Intravenous Q Thu-HD Trevor Iha, MD      . DOPamine (INTROPIN) 800 mg in dextrose 5 % 250 mL infusion  2-20 mcg/kg/min Intravenous Titrated Ardis Rowan, MD   3 mcg/kg/min at 01/14/13 0011  . doxycycline (VIBRA-TABS) tablet 100 mg  100 mg Oral Q12H Sadie Haber, MD   100 mg at 01/14/13 2215  . feeding supplement (RESOURCE BREEZE) liquid 1 Container  1 Container Oral BID BM Tonye Becket, RD   1 Container at  01/14/13 [phone]  . ferric gluconate (NULECIT) 125 mg in sodium chloride 0.9 % 100 mL IVPB  125 mg Intravenous Q T,Th,Sa-HD Trevor Iha, MD   125 mg at 01/13/13 0944  . insulin aspart (novoLOG) injection 0-9 Units  0-9 Units Subcutaneous TID WC Chrystie Nose, MD   2 Units at 01/14/13 1230  . insulin aspart protamine- aspart (NOVOLOG 70/30) injection 10 Units  10 Units Subcutaneous Q breakfast Chrystie Nose, MD   10 Units at 01/14/13 1231  . insulin aspart protamine- aspart (NOVOLOG 70/30) injection 5 Units  5 Units Subcutaneous Q supper Chrystie Nose, MD   5 Units at 01/13/13 1818  . midodrine (PROAMATINE) tablet 10 mg  10 mg Oral TID WC Trevor Iha, MD   10 mg at 01/14/13 1613  . multivitamin (RENA-VIT) tablet 1 tablet  1 tablet Oral Daily Lauris Poag, MD   1 tablet at 01/14/13 [phone]  . ondansetron (ZOFRAN) injection 4 mg  4 mg Intravenous Q6H PRN Brittainy Simmons, PA-C   4 mg at 01/14/13 0113  . oxyCODONE (Oxy IR/ROXICODONE) immediate release tablet 5-10 mg  5-10 mg Oral Q2H PRN Regina J Roczniak, PA-C      . pantoprazole (PROTONIX) EC tablet 40 mg  40 mg Oral Daily Brittainy Simmons, PA-C   40  mg at 01/14/13 0926  . phenytoin (DILANTIN) ER capsule 300 mg  300 mg Oral QHS Brittainy Simmons, PA-C   300 mg at 01/14/13 2214  . senna-docusate (Senokot-S) tablet 1 tablet  1 tablet Oral QHS PRN Robbie Lis, PA-C   1 tablet at 01/05/13 0958  . simvastatin (ZOCOR) tablet 10 mg  10 mg Oral QHS Brittainy Simmons, PA-C   10 mg at 01/14/13 2215  . sodium chloride 0.9 % injection 3 mL  3 mL Intravenous Q12H Brittainy Simmons, PA-C   3 mL at 01/14/13 2200  . sodium chloride 0.9 % injection 3 mL  3 mL Intravenous PRN Brittainy Simmons, PA-C      . Warfarin - Pharmacist Dosing Inpatient   Does not apply q1800 Chrystie Nose, MD      . zolpidem (AMBIEN) tablet 5 mg  5 mg Oral QHS PRN Robbie Lis, PA-C        PE: General appearance: alert, cooperative and no distress Lungs:  clear to auscultation bilaterally Heart: regular rate and rhythm and 2/6 SM Extremities: no LEE Pulses: 2+ and symmetric Skin: warm and dry Neurologic: Grossly normal  Lab Results:   Recent Labs  01/14/13 0435 01/14/13 0705 01/14/13 1951  WBC 23.1* 22.9* 25.1*  HGB 8.6* 8.6* 7.9*  HCT 26.4* 26.9* 24.0*  PLT 138* 144* 161   BMET  Recent Labs  01/13/13 0344  NA 136  K 4.3  CL 100  CO2 24  GLUCOSE 110*  BUN 44*  CREATININE 1.94*  CALCIUM 8.2*   PT/INR  Recent Labs  01/13/13 0344 01/14/13 0435  LABPROT 24.5* 19.1*  INR 2.29* 1.66*    Assessment/Plan  Principal Problem:   Acute on chronic diastolic CHF (congestive heart failure), NYHA class 3, 09/25/12 Active Problems:   Sleep apnea on c-pap    Aortic insufficiency: mild   Chronic venous insufficiency with LE ulcers, will be followed at wound center   Diabetes mellitus type 2 in obese   Hypertrophic obstructive cardiomyopathy(425.11)   History of Rt brain CVA   PAF (paroxysmal atrial fibrillation),    Long term (current) use of anticoagulants   Chronic kidney disease (CKD), stage IV (severe), now with HD   Cardiorenal syndrome with renal failure   Cardiogenic shock   Hypotension   Acute blood loss anemia  Plan: Pt is extremely hypotensive with with SBPs in the 50s-70s. Most recent BP is 60/28. Pt denies any symptoms and states that she feels no different than she has before. Will start phenylephrine. Will need to get CBC to see if H/H is dropping. Blood could not be drawn earlier today, due to difficulty getting access. ? If blood can be collected from dialysis catheter. Will likely need to delay HD due to hypotension. Dr. Rennis Golden to see.     LOS: 16 days    Brittainy M. Sharol Harness, PA-C 01/15/2013 8:29 AM

## 2013-01-15 NOTE — Progress Notes (Signed)
Pt. Refused cpap for tonight. 

## 2013-01-15 NOTE — Progress Notes (Signed)
VASCULAR LAB PRELIMINARY  PRELIMINARY  PRELIMINARY  PRELIMINARY  Left AVF duplex completed.    Preliminary report:  Hematoma measuring 5cm x 2cm in the left upper arm.  Smaller hematoma in the ante cubitus/forearm. No evidence of active bleeding.  Fistula patent.    Morrie Daywalt, RVT 01/15/2013, 11:32 AM

## 2013-01-15 NOTE — Procedures (Signed)
Arterial Catheter Insertion Procedure Note [name] [id number] 05-15-1934  Procedure: Insertion of Arterial Catheter  Indications: Blood pressure monitoring  Procedure Details Consent: Risks of procedure as well as the alternatives and risks of each were explained to the (patient/caregiver).  Consent for procedure obtained. Time Out: Verified patient identification, verified procedure, site/side was marked, verified correct patient position, special equipment/implants available, medications/allergies/relevent history reviewed, required imaging and test results available.  Performed  Maximum sterile technique was used including antiseptics, cap, gloves, gown, hand hygiene, mask and sheet. Skin prep: Chlorhexidine; local anesthetic administered 20 gauge catheter was inserted into right radial artery using the Seldinger technique.  Evaluation Blood flow good; BP tracing good. Complications: No apparent complications.   Koren Bound 01/15/2013

## 2013-01-15 NOTE — Procedures (Signed)
I was present at this session.  I have reviewed the session itself and made appropriate changes.  Bp low, for some reason T&S not sent as instructed 2 h agol.  Give alb as temporizing measure, and the PRBC.  Bp 80s   Deagen Krass L 7/3/201412:26 PM

## 2013-01-15 NOTE — Progress Notes (Signed)
OT Cancellation Note  Patient Details Name: TYHESHA DUTSON MRN: [id number] DOB: 1933/09/26   Cancelled Treatment:    Reason Eval/Treat Not Completed: Medical issues which prohibited therapy  Per nursing pt is not appropriate for OT tx today.  Will check back 7/4 if time permits.  Tandy Lewin 01/15/2013, 1:14 PM

## 2013-01-15 NOTE — Progress Notes (Signed)
Subjective: Interval History: No C/o  Objective: Vital signs in last 24 hours: Temp:  [97.4 F (36.3 C)-97.8 F (36.6 C)] 97.8 F (36.6 C) (07/03 0721) Pulse Rate:  [67-200] 80 (07/03 0700) Resp:  [0-32] 24 (07/03 0700) BP: (74-118)/(18-69) 79/38 mmHg (07/03 0700) SpO2:  [60 %-100 %] 86 % (07/03 0700) Weight:  [96.1 kg (211 lb 13.8 oz)] 96.1 kg (211 lb 13.8 oz) (07/03 0500) Weight change: -1.7 kg (-3 lb 12 oz)  Intake/Output from previous day: 07/02 0701 - 07/03 0700 In: 120 [P.O.:120] Out: 200 [Urine:200] Intake/Output this shift:    General appearance: alert, cooperative and moderately obese Resp: diminished breath sounds bilaterally and rhonchi bibasilar Cardio: regularly irregular rhythm, S1, S2 normal and systolic murmur: holosystolic 3/6, blowing at apex GI: pos bs, soft, liver down 4 cm Extremities: AVF LUA,some swelling, pos B&T  Lab Results:  Recent Labs  01/14/13 0705 01/14/13 1951  WBC 22.9* 25.1*  HGB 8.6* 7.9*  HCT 26.9* 24.0*  PLT 144* 161   BMET:  Recent Labs  01/13/13 0344  NA 136  K 4.3  CL 100  CO2 24  GLUCOSE 110*  BUN 44*  CREATININE 1.94*  CALCIUM 8.2*    Recent Labs  01/12/13 0903  PTH 120.1*   Iron Studies:  Recent Labs  01/12/13 0903  IRON 67  TIBC 243*    Studies/Results: Dg Chest Port 1 View  01/14/2013   *RADIOLOGY REPORT*  Clinical Data: Shortness of breath.  PORTABLE CHEST - 1 VIEW  Comparison: 01/12/2013.  Findings: Trachea is midline.  Right IJ dialysis catheter tips project over the SVC and SVC/RA junction, respectively.  Heart is enlarged, stable.  Mild bibasilar air space disease, minimally improved from 01/12/2013.  Probable tiny bilateral pleural effusions.  IMPRESSION: Favor minimal improvement in congestive heart failure.   Original Report Authenticated By: Leanna Battles, M.D.    I have reviewed the patient's current medications.  Assessment/Plan: 1 CRF for HD.  Will transfuse,keep even, vol xs yet but  resp ok. 2 Anemia worsening, will transfuse 3 HPTH 4 Dm controlled 5 OSA 6 COPD 7 Pulm HTN P HD, transfuse,follow bp , midodrine    LOS: 16 days   Eero Dini L 01/15/2013,8:18 AM

## 2013-01-15 NOTE — Progress Notes (Signed)
PT Cancellation Note  Patient Details Name: EMALEY APPLIN MRN: [id number] DOB: 1933-11-05   Cancelled Treatment:    Reason Eval/Treat Not Completed: Medical issues which prohibited therapy.  Spoke with RN who states pt not appropriate at this time for PT session.       Verdell Face, [place] [phone] 01/15/2013

## 2013-01-16 LAB — GLUCOSE, CAPILLARY
Glucose-Capillary: 108 mg/dL — ABNORMAL HIGH (ref 70–99)
Glucose-Capillary: 120 mg/dL — ABNORMAL HIGH (ref 70–99)
Glucose-Capillary: 107 mg/dL — ABNORMAL HIGH (ref 70–99)
Glucose-Capillary: 99 mg/dL (ref 70–99)

## 2013-01-16 LAB — TYPE AND SCREEN

## 2013-01-16 LAB — CBC
MCH: 30.6 pg (ref 26.0–34.0)
Platelets: 196 10*3/uL (ref 150–400)
RBC: 3.33 MIL/uL — ABNORMAL LOW (ref 3.87–5.11)
WBC: 24.5 10*3/uL — ABNORMAL HIGH (ref 4.0–10.5)

## 2013-01-16 LAB — URINE CULTURE: Colony Count: 40000

## 2013-01-16 LAB — PROTIME-INR: Prothrombin Time: 23.3 seconds — ABNORMAL HIGH (ref 11.6–15.2)

## 2013-01-16 MED ORDER — RENA-VITE PO TABS
1.0000 | ORAL_TABLET | Freq: Every day | ORAL | Status: DC
  Administered 2013-01-17: 22:00:00 via ORAL
  Administered 2013-01-18: 1 via ORAL
  Administered 2013-01-19: 22:00:00 via ORAL
  Administered 2013-01-20 – 2013-01-22 (×3): 1 via ORAL
  Filled 2013-01-16 (×7): qty 1

## 2013-01-16 NOTE — Progress Notes (Signed)
Pt. Refused cpap for tonight. 

## 2013-01-16 NOTE — Progress Notes (Signed)
[facility]  Subjective: The left upper arm is sore, but otherwise no complaints.   Objective: Vital signs in last 24 hours: Temp:  [97.3 F (36.3 C)-98.5 F (36.9 C)] 97.7 F (36.5 C) (07/04 0400) Pulse Rate:  [69-227] 70 (07/04 0400) Resp:  [13-29] 20 (07/04 0400) BP: (60-107)/(17-48) 95/41 mmHg (07/03 1545) SpO2:  [65 %-100 %] 100 % (07/04 0400) Arterial Line BP: (79-115)/(36-54) 115/54 mmHg (07/04 0600) Weight:  [210 lb 12.2 oz (95.6 kg)-219 lb 12.8 oz (99.7 kg)] 219 lb 12.8 oz (99.7 kg) (07/04 0456) Last BM Date: 01/12/13  Intake/Output from previous day: 07/03 0701 - 07/04 0700 In: 3017.5 [I.V.:2097.5; Blood:700; IV Piggyback:220] Out: -938  Intake/Output this shift:    Medications Current Facility-Administered Medications  Medication Dose Route Frequency Provider Last Rate Last Dose  . 0.9 %  sodium chloride infusion  250 mL Intravenous PRN Brittainy Simmons, PA-C 10 mL/hr at 01/12/13 1900 250 mL at 01/12/13 1900  . 0.9 %  sodium chloride infusion   Intravenous Continuous Judie Petit, MD 10 mL/hr at 01/12/13 1900    . 0.9 %  sodium chloride infusion  100 mL Intravenous PRN Llana Aliment Deterding, MD      . 0.9 %  sodium chloride infusion  100 mL Intravenous PRN Trevor Iha, MD      . acetaminophen (TYLENOL) tablet 650 mg  650 mg Oral Q4H PRN Brittainy Simmons, PA-C      . allopurinol (ZYLOPRIM) tablet 150 mg  150 mg Oral Daily Brittainy Simmons, PA-C   150 mg at 01/14/13 0926  . ALPRAZolam Prudy Feeler) tablet 0.25 mg  0.25 mg Oral BID PRN Brittainy Simmons, PA-C      . antiseptic oral rinse (BIOTENE) solution 15 mL  15 mL Mouth Rinse BID Chrystie Nose, MD   15 mL at 01/15/13 1934  . camphor-menthol (SARNA) lotion   Topical PRN Zigmund Gottron, MD      . darbepoetin [facility]) injection 200 mcg  200 mcg Intravenous Q Thu-HD Trevor Iha, MD   200 mcg at 01/15/13 1446  . doxycycline (VIBRA-TABS) tablet 100 mg  100 mg Oral Q12H  Sadie Haber, MD   100 mg at 01/15/13 2116  . feeding supplement (NEPRO CARB STEADY) liquid 237 mL  237 mL Oral PRN Trevor Iha, MD      . feeding supplement (RESOURCE BREEZE) liquid 1 Container  1 Container Oral BID BM Tonye Becket, RD   1 Container at 01/15/13 1400  . ferric gluconate (NULECIT) 125 mg in sodium chloride 0.9 % 100 mL IVPB  125 mg Intravenous Q T,Th,Sa-HD Trevor Iha, MD   125 mg at 01/15/13 1719  . heparin injection 1,000 Units  1,000 Units Dialysis PRN Trevor Iha, MD      . insulin aspart (novoLOG) injection 0-9 Units  0-9 Units Subcutaneous TID WC Chrystie Nose, MD   2 Units at 01/14/13 1230  . insulin aspart protamine- aspart (NOVOLOG 70/30) injection 10 Units  10 Units Subcutaneous Q breakfast Chrystie Nose, MD   10 Units at 01/15/13 1053  . insulin aspart protamine- aspart (NOVOLOG 70/30) injection 5 Units  5 Units Subcutaneous Q supper Chrystie Nose, MD   5 Units at 01/13/13 1818  . lidocaine (PF) (XYLOCAINE) 1 % injection 5 mL  5 mL Intradermal PRN Trevor Iha, MD      . lidocaine-prilocaine (EMLA) cream 1 application  1 application Topical  PRN Trevor Iha, MD      . multivitamin (RENA-VIT) tablet 1 tablet  1 tablet Oral Daily Lauris Poag, MD   1 tablet at 01/14/13 [phone]  . ondansetron (ZOFRAN) injection 4 mg  4 mg Intravenous Q6H PRN Brittainy Simmons, PA-C   4 mg at 01/14/13 0113  . oxyCODONE (Oxy IR/ROXICODONE) immediate release tablet 5-10 mg  5-10 mg Oral Q2H PRN Regina J Roczniak, PA-C      . pantoprazole (PROTONIX) EC tablet 40 mg  40 mg Oral Daily Brittainy Simmons, PA-C   40 mg at 01/15/13 1750  . pentafluoroprop-tetrafluoroeth (GEBAUERS) aerosol 1 application  1 application Topical PRN Trevor Iha, MD      . phenylephrine (NEO-SYNEPHRINE) 40,000 mcg in dextrose 5 % 250 mL infusion  30-200 mcg/min Intravenous Titrated Chrystie Nose, MD 75 mL/hr at 01/16/13 0500 200 mcg/min at 01/16/13 0500  . phenytoin  (DILANTIN) ER capsule 300 mg  300 mg Oral QHS Brittainy Simmons, PA-C   300 mg at 01/15/13 2116  . senna-docusate (Senokot-S) tablet 1 tablet  1 tablet Oral QHS PRN Robbie Lis, PA-C   1 tablet at 01/05/13 0958  . simvastatin (ZOCOR) tablet 10 mg  10 mg Oral QHS Brittainy Simmons, PA-C   10 mg at 01/15/13 2116  . sodium chloride 0.9 % injection 3 mL  3 mL Intravenous Q12H Brittainy Simmons, PA-C   3 mL at 01/14/13 2200  . sodium chloride 0.9 % injection 3 mL  3 mL Intravenous PRN Brittainy Simmons, PA-C      . zolpidem (AMBIEN) tablet 5 mg  5 mg Oral QHS PRN Robbie Lis, PA-C        PE: General appearance: alert, cooperative and no distress Lungs: clear to auscultation bilaterally Heart: regular rate and rhythm Extremities: Left upper extremity has ecchymosis along the anterior aspect, + swelling and mild tenderness Pulses: 2+ and symmetric Skin: warm and dry Neurologic: Grossly normal  Lab Results:   Recent Labs  01/14/13 1951 01/15/13 0940 01/16/13 0500  WBC 25.1* 26.4* 24.5*  HGB 7.9* 8.2* 10.2*  HCT 24.0* 25.3* 30.0*  PLT 161 183 196   BMET  Recent Labs  01/15/13 1139  NA 133*  K 4.4  CL 98  CO2 22  GLUCOSE 188*  BUN 45*  CREATININE 2.44*  CALCIUM 8.1*   PT/INR  Recent Labs  01/14/13 0435 01/15/13 1030 01/16/13 0500  LABPROT 19.1* 22.5* 23.3*  INR 1.66* 2.05* 2.15*   Cholesterol No results found for this basename: CHOL,  in the last 72 hours Cardiac Enzymes No components found with this basename: TROPONIN,  CKMB,   Studies/Results:  Duplex ultrasound of left upper extremity Preliminary report: Hematoma measuring 5cm x 2cm in the left upper arm. Smaller hematoma in the ante cubitus/forearm. No evidence of active bleeding. Fistula patent.  CESTONE, HELENE, RVT  01/15/2013, 11:32 AM   Assessment/Plan  Principal Problem:   Acute on chronic diastolic CHF (congestive heart failure), NYHA class 3, 09/25/12 Active Problems:   Sleep apnea on  c-pap    Aortic insufficiency: mild   Chronic venous insufficiency with LE ulcers, will be followed at wound center   Diabetes mellitus type 2 in obese   Hypertrophic obstructive cardiomyopathy(425.11)   History of Rt brain CVA   PAF (paroxysmal atrial fibrillation),    Long term (current) use of anticoagulants   Chronic kidney disease (CKD), stage IV (severe), now with HD   Cardiorenal syndrome with renal failure  Cardiogenic shock   Hypotension   Acute blood loss anemia  Plan: Her BP is improved. She remains on 200 mcg/min of IV neo-synephrine. Most recenct BP is 115/54. She was transfused yesterday. H/H improved to 10/30. Will continue to follow daily CBCs.  Continue with HD as BP allows. Renal following. Duplex ultrasound of the left upper extremity demonstrated a hematoma measuring 5cm x 2 cm and a smaller hematoma in the ante cubitus/forearm. No evidence of active bleeding. Fistula patent. Vascular surgery is following. Will continue to monitor closely.   LOS: 17 days    Brittainy M. Delmer Islam 01/16/2013 7:45 AM  I have seen and evaluated the patient this AM along with Boyce Medici, PA. I agree with her findings, examination as well as impression recommendations.  Thankfully, her SBP has improved with transfusion & Phenylephrine.  Pain from hematoma - care per Vasc Sgx recs. - H/H improved.  I think we will need to begin a slow wean of pressors. MAP of 70 may not be achievable based upon her history of SBPs in 90s-100s.  Using Midodrine to assist with BP per Nephrology.  Continues to tolerate HD yesterday.  I think her volume status, from a volume removal standpoint is appropriate, but CXR still suggest mild Pulmonary vascular congestion. WBC remains elevated - no overt sign of infection (but with hypotension, need to be sure no indolent infectious etiology) -- blood & urine Cx NGTD, but Yeast & few bacteria with Leukocytes noted in urine (? If contaminant vs.  Infection)  MD Time with pt: 10 min  HARDING,DAVID W, M.D., M.S. [facility] [address] [place], [place]  [address]  [phone] Pager # [phone] 01/16/2013 8:47 AM

## 2013-01-16 NOTE — Progress Notes (Addendum)
  VASCULAR AND VEIN SURGERY PROGRESS NOTE  POST-OP HEMODIALYSIS ACCESS  Date of Surgery: 12/30/2012 - 01/12/2013 Surgeon: Moishe Spice): Larina Earthly, MD 4 Days Post-Op Left  ARTERIOVENOUS (AV) FISTULA CREATION INSERTION OF DIALYSIS CATHETER   HPI: [name] is a 79 y.o. female who is 4 Days Post-Op creation/revision of left upper extremity Hemodialysis access. The patient denies symptoms of numbness, tingling, weakness; states pain in the operative limb is better.   Significant Diagnostic Studies: CBC Lab Results  Component Value Date   WBC 24.5* 01/16/2013   HGB 10.2* 01/16/2013   HCT 30.0* 01/16/2013   MCV 90.1 01/16/2013   PLT 196 01/16/2013    Vital Signs  BP Readings from Last 3 Encounters:  01/15/13 95/41  01/15/13 95/41  12/23/12 112/56   Temp Readings from Last 3 Encounters:  01/16/13 97.9 F (36.6 C) Oral  01/16/13 97.9 F (36.6 C) Oral  12/07/12 97.3 F (36.3 C) Oral   SpO2 Readings from Last 3 Encounters:  01/16/13 98%  01/16/13 98%  12/07/12 99%   Pulse Readings from Last 3 Encounters:  01/16/13 72  01/16/13 72  12/23/12 76     Physical Examination  left upper Incision is ecchymotic but healing well, skin color is normal , hand grip is 5/5, sensation in digits is intact;  There is a good thrill and good bruit in the AVF The area around [facility] space and post arm are very soft, non tender and ecchymosis is resolving.  Assessment/Plan [name] is a 79 y.o. year old female who presents s/p creation/revision of left upper extremity Hemodialysis access. Ecchymosis/ hematoma resolving in left arm Dr. Hart Rochester suggests leaving pt off coumadin one more day Will continue to follow Follow-up in 4 weeks  The patient's access will be ready for use in 12 weeks.  [name],[name] 01/16/2013 10:36 AM   Left upper extremity stable-evidence of hematoma - proximal upper arm Fistula functioning well next continue current treatment and Dr. early will followup with  patient on Monday

## 2013-01-16 NOTE — Progress Notes (Signed)
ANTICOAGULATION CONSULT NOTE - Follow Up Consult  Pharmacy Consult for Warfarin (current holding) Indication: Hx Afib/DVT  No Known Allergies  Patient Measurements: Height: 5\' 5"  (165.1 cm) Weight: 219 lb 12.8 oz (99.7 kg) IBW/kg (Calculated) : 57  Vital Signs: Temp: 97.9 F (36.6 C) (07/04 1200) Temp src: Oral (07/04 1200) Pulse Rate: 72 (07/04 0730)  Labs:  Recent Labs  01/14/13 0435  01/14/13 1951 01/15/13 0940 01/15/13 1030 01/15/13 1139 01/16/13 0500  HGB 8.6*  < > 7.9* 8.2*  --   --  10.2*  HCT 26.4*  < > 24.0* 25.3*  --   --  30.0*  PLT 138*  < > 161 183  --   --  196  LABPROT 19.1*  --   --   --  22.5*  --  23.3*  INR 1.66*  --   --   --  2.05*  --  2.15*  CREATININE  --   --   --   --   --  2.44*  --   < > = values in this interval not displayed.  Estimated Creatinine Clearance: 22.6 ml/min (by C-G formula based on Cr of 2.44).   Assessment: 79 y.o. F with warfarin resumed from PTA for hx Afib/DVT. Pt with AVF placed 6/30 and noted to have large LUE hematoma and small forearm hematoma on 7/3. Warfarin was held on 7/3 evening. Per VVS to continue holding for 1 more day. INR this morning remains therapeutic despite holding doses (INR 2.15 << 2.05, goal of 2-3). Hgb/Hct up after 2 units PRBC on 6/3, plts wnl.   Goal of Therapy:  INR 2-3   Plan:  1. Hold warfarin dose again today (per VVS) 2. Will continue to monitor for any signs/symptoms of bleeding and will follow up with PT/INR in the a.m.   Georgina Pillion, PharmD, BCPS Clinical Pharmacist Pager: [phone] 01/16/2013 1:51 PM

## 2013-01-16 NOTE — Progress Notes (Signed)
Subjective: Interval History: none.  Objective: Vital signs in last 24 hours: Temp:  [97.3 F (36.3 C)-98.5 F (36.9 C)] 97.9 F (36.6 C) (07/04 0730) Pulse Rate:  [69-227] 72 (07/04 0730) Resp:  [12-29] 12 (07/04 0800) BP: (70-107)/(17-48) 95/41 mmHg (07/03 1545) SpO2:  [65 %-100 %] 98 % (07/04 0730) Arterial Line BP: (79-126)/(36-58) 126/58 mmHg (07/04 0800) Weight:  [95.6 kg (210 lb 12.2 oz)-99.7 kg (219 lb 12.8 oz)] 99.7 kg (219 lb 12.8 oz) (07/04 0456) Weight change: -0.5 kg (-1 lb 1.6 oz)  Intake/Output from previous day: 07/03 0701 - 07/04 0700 In: 3017.5 [I.V.:2097.5; Blood:700; IV Piggyback:220] Out: -938  Intake/Output this shift: Total I/O In: 170 [I.V.:170] Out: -   General appearance: alert, cooperative and moderately obese Resp: diminished breath sounds bilaterally Cardio: S1, S2 normal and systolic murmur: holosystolic 2/6, blowing at apex GI: obese,pos bs,liver down 5 cm Extremities: AVF, LUA B&T, tender not tight, 1+ edema.  Lab Results:  Recent Labs  01/15/13 0940 01/16/13 0500  WBC 26.4* 24.5*  HGB 8.2* 10.2*  HCT 25.3* 30.0*  PLT 183 196   BMET:  Recent Labs  01/15/13 1139  NA 133*  K 4.4  CL 98  CO2 22  GLUCOSE 188*  BUN 45*  CREATININE 2.44*  CALCIUM 8.1*   No results found for this basename: PTH,  in the last 72 hours Iron Studies: No results found for this basename: IRON, TIBC, TRANSFERRIN, FERRITIN,  in the last 72 hours  Studies/Results: Dg Chest Port 1 View  01/15/2013   *RADIOLOGY REPORT*  Clinical Data: Central venous catheter placement.  PORTABLE CHEST - 1 VIEW  Comparison: Chest x-ray 01/14/2013.  Findings: There is a left-sided internal jugular central venous catheter with tip terminating in the mid to distal superior vena cava. Right internal jugular PermCath remains in position with the tips terminating in the distal superior vena cava and superior aspect of the right atrium.  Lung volumes are normal.  No acute  consolidative airspace disease.  No pneumothorax.  No pleural effusions.  Mild pulmonary venous congestion, without frank pulmonary edema.  Mild cardiomegaly. The patient is rotated to the right on today's exam, resulting in distortion of the mediastinal contours and reduced diagnostic sensitivity and specificity for mediastinal pathology.  Atherosclerosis in the thoracic aorta.  IMPRESSION: 1.  Support apparatus, as above. 2.  No pneumothorax following placement of left-sided internal jugular catheter. 3.  Mild cardiomegaly with pulmonary venous congestion, but no frank pulmonary edema.   Original Report Authenticated By: Trudie Reed, M.D.   Dg Chest Port 1 View  01/14/2013   *RADIOLOGY REPORT*  Clinical Data: Shortness of breath.  PORTABLE CHEST - 1 VIEW  Comparison: 01/12/2013.  Findings: Trachea is midline.  Right IJ dialysis catheter tips project over the SVC and SVC/RA junction, respectively.  Heart is enlarged, stable.  Mild bibasilar air space disease, minimally improved from 01/12/2013.  Probable tiny bilateral pleural effusions.  IMPRESSION: Favor minimal improvement in congestive heart failure.   Original Report Authenticated By: Leanna Battles, M.D.    I have reviewed the patient's current medications.  Assessment/Plan: 1 CRF for HD in am. Did well after blood 2 Hypotension on pressors ? Wean 3 Anemia better after blood 4 DM controlled 5 Pulm HTN 6 HPTH 7 OSA 8 Obesity P HD, epo, wean pressors, check CVP    LOS: 17 days   Dola Lunsford L 01/16/2013,8:59 AM

## 2013-01-17 LAB — CBC
HCT: 30.1 % — ABNORMAL LOW (ref 36.0–46.0)
Hemoglobin: 10.2 g/dL — ABNORMAL LOW (ref 12.0–15.0)
MCH: 31.3 pg (ref 26.0–34.0)
MCHC: 33.9 g/dL (ref 30.0–36.0)
MCV: 92.3 fL (ref 78.0–100.0)
Platelets: 131 K/uL — ABNORMAL LOW (ref 150–400)
RBC: 3.26 MIL/uL — ABNORMAL LOW (ref 3.87–5.11)
RDW: 19.7 % — ABNORMAL HIGH (ref 11.5–15.5)
WBC: 17.5 K/uL — ABNORMAL HIGH (ref 4.0–10.5)

## 2013-01-17 LAB — COMPREHENSIVE METABOLIC PANEL
AST: 51 U/L — ABNORMAL HIGH (ref 0–37)
Albumin: 2.3 g/dL — ABNORMAL LOW (ref 3.5–5.2)
BUN: 39 mg/dL — ABNORMAL HIGH (ref 6–23)
Calcium: 8.4 mg/dL (ref 8.4–10.5)
Chloride: 96 mEq/L (ref 96–112)
Creatinine, Ser: 2.67 mg/dL — ABNORMAL HIGH (ref 0.50–1.10)
GFR calc non Af Amer: 16 mL/min — ABNORMAL LOW (ref >90)
Total Bilirubin: 1.2 mg/dL (ref 0.3–1.2)

## 2013-01-17 LAB — PROTIME-INR
INR: 1.97 — ABNORMAL HIGH (ref 0.00–1.49)
Prothrombin Time: 21.8 s — ABNORMAL HIGH (ref 11.6–15.2)

## 2013-01-17 LAB — GLUCOSE, CAPILLARY
Glucose-Capillary: 94 mg/dL (ref 70–99)
Glucose-Capillary: 93 mg/dL (ref 70–99)
Glucose-Capillary: 151 mg/dL — ABNORMAL HIGH (ref 70–99)

## 2013-01-17 LAB — PHOSPHORUS: Phosphorus: 4.1 mg/dL (ref 2.3–4.6)

## 2013-01-17 MED ORDER — HEPARIN SODIUM (PORCINE) 1000 UNIT/ML DIALYSIS
40.0000 [IU]/kg | Freq: Once | INTRAMUSCULAR | Status: AC
  Administered 2013-01-17: 4000 [IU] via INTRAVENOUS_CENTRAL
  Filled 2013-01-17: qty 4

## 2013-01-17 MED ORDER — SODIUM CHLORIDE 0.9 % IV SOLN: 100.0000 mL | INTRAVENOUS | Status: DC | PRN

## 2013-01-17 MED ORDER — WARFARIN - PHARMACIST DOSING INPATIENT
Freq: Every day | Status: DC
  Administered 2013-01-17 – 2013-01-23 (×4)

## 2013-01-17 MED ORDER — ALTEPLASE 2 MG IJ SOLR
2.0000 mg | Freq: Once | INTRAMUSCULAR | Status: AC | PRN
  Filled 2013-01-17: qty 2

## 2013-01-17 MED ORDER — NEPRO/CARBSTEADY PO LIQD
237.0000 mL | ORAL | Status: DC | PRN
  Filled 2013-01-17: qty 237

## 2013-01-17 MED ORDER — LIDOCAINE HCL (PF) 1 % IJ SOLN: 5.0000 mL | INTRAMUSCULAR | Status: DC | PRN

## 2013-01-17 MED ORDER — LIDOCAINE-PRILOCAINE 2.5-2.5 % EX CREA
1.0000 "application " | TOPICAL_CREAM | CUTANEOUS | Status: DC | PRN
  Filled 2013-01-17: qty 5

## 2013-01-17 MED ORDER — PENTAFLUOROPROP-TETRAFLUOROETH EX AERO: 1.0000 "application " | INHALATION_SPRAY | CUTANEOUS | Status: DC | PRN

## 2013-01-17 MED ORDER — WARFARIN SODIUM 2 MG PO TABS
2.0000 mg | ORAL_TABLET | Freq: Once | ORAL | Status: AC
  Administered 2013-01-17: 2 mg via ORAL
  Filled 2013-01-17: qty 1

## 2013-01-17 MED ORDER — HEPARIN SODIUM (PORCINE) 1000 UNIT/ML DIALYSIS: 1000.0000 [IU] | INTRAMUSCULAR | Status: DC | PRN

## 2013-01-17 NOTE — Progress Notes (Signed)
Pt is refusing to wear CPAP tonight. RT made pt aware that if she changed her mind to call.  

## 2013-01-17 NOTE — Progress Notes (Signed)
Subjective: Interval History: none.  Objective: Vital signs in last 24 hours: Temp:  [97.1 F (36.2 C)-97.9 F (36.6 C)] 97.1 F (36.2 C) (07/05 0400) Pulse Rate:  [25-138] 138 (07/05 0000) Resp:  [0-28] 14 (07/05 0700) SpO2:  [95 %-100 %] 96 % (07/05 0700) Arterial Line BP: (117-160)/(47-58) 151/48 mmHg (07/05 0700) Weight:  [99.8 kg (220 lb 0.3 oz)] 99.8 kg (220 lb 0.3 oz) (07/05 0444) Weight change: 4.2 kg (9 lb 4.2 oz)  Intake/Output from previous day: 07/04 0701 - 07/05 0700 In: 1678.5 [P.O.:609; I.V.:1069.5] Out: -  Intake/Output this shift:    General appearance: cooperative, moderately obese and slowed mentation Resp: diminished breath sounds bilaterally Cardio: regular rate and rhythm and systolic murmur: holosystolic 2/6, blowing at apex GI: obese, pos bs, liver down 5 cm Extremities: edema 2+ AVF LUA B&T, arm swollen  Lab Results:  Recent Labs  01/16/13 0500 01/17/13 0345  WBC 24.5* 17.5*  HGB 10.2* 10.2*  HCT 30.0* 30.1*  PLT 196 131*   BMET:  Recent Labs  01/15/13 1139  NA 133*  K 4.4  CL 98  CO2 22  GLUCOSE 188*  BUN 45*  CREATININE 2.44*  CALCIUM 8.1*   No results found for this basename: PTH,  in the last 72 hours Iron Studies: No results found for this basename: IRON, TIBC, TRANSFERRIN, FERRITIN,  in the last 72 hours  Studies/Results: Dg Chest Port 1 View  01/15/2013   *RADIOLOGY REPORT*  Clinical Data: Central venous catheter placement.  PORTABLE CHEST - 1 VIEW  Comparison: Chest x-ray 01/14/2013.  Findings: There is a left-sided internal jugular central venous catheter with tip terminating in the mid to distal superior vena cava. Right internal jugular PermCath remains in position with the tips terminating in the distal superior vena cava and superior aspect of the right atrium.  Lung volumes are normal.  No acute consolidative airspace disease.  No pneumothorax.  No pleural effusions.  Mild pulmonary venous congestion, without frank  pulmonary edema.  Mild cardiomegaly. The patient is rotated to the right on today's exam, resulting in distortion of the mediastinal contours and reduced diagnostic sensitivity and specificity for mediastinal pathology.  Atherosclerosis in the thoracic aorta.  IMPRESSION: 1.  Support apparatus, as above. 2.  No pneumothorax following placement of left-sided internal jugular catheter. 3.  Mild cardiomegaly with pulmonary venous congestion, but no frank pulmonary edema.   Original Report Authenticated By: Trudie Reed, M.D.    I have reviewed the patient's current medications.  Assessment/Plan: 1 ESRD  For HD, keep even today 2 Anemia stable. 3 COPD 4 OSA 5 DM controlled 6 Obesity 7 Hx CVA 8 HPTH meds P HD, epo, vit D    LOS: 18 days   Arif Amendola L 01/17/2013,8:13 AM

## 2013-01-17 NOTE — Progress Notes (Signed)
[facility]  Subjective: No complaints. Left arm pain and swelling has improved.  Objective: Vital signs in last 24 hours: Temp:  [97.1 F (36.2 C)-97.9 F (36.6 C)] 97.4 F (36.3 C) (07/05 0800) Pulse Rate:  [25-138] 138 (07/05 0000) Resp:  [0-28] 25 (07/05 0800) SpO2:  [95 %-100 %] 96 % (07/05 0800) Arterial Line BP: (122-160)/(46-58) 143/46 mmHg (07/05 0800) Weight:  [220 lb 0.3 oz (99.8 kg)] 220 lb 0.3 oz (99.8 kg) (07/05 0444) Last BM Date: 01/12/13  Intake/Output from previous day: 07/04 0701 - 07/05 0700 In: 1678.5 [P.O.:609; I.V.:1069.5] Out: -  Intake/Output this shift: Total I/O In: 20 [I.V.:20] Out: -   Medications Current Facility-Administered Medications  Medication Dose Route Frequency Provider Last Rate Last Dose  . 0.9 %  sodium chloride infusion  250 mL Intravenous PRN Brittainy Simmons, PA-C 10 mL/hr at 01/12/13 1900 250 mL at 01/12/13 1900  . 0.9 %  sodium chloride infusion   Intravenous Continuous Judie Petit, MD      . 0.9 %  sodium chloride infusion  100 mL Intravenous PRN Llana Aliment Deterding, MD      . 0.9 %  sodium chloride infusion  100 mL Intravenous PRN Trevor Iha, MD      . acetaminophen (TYLENOL) tablet 650 mg  650 mg Oral Q4H PRN Brittainy Simmons, PA-C      . allopurinol (ZYLOPRIM) tablet 150 mg  150 mg Oral Daily Brittainy Simmons, PA-C   150 mg at 01/16/13 0959  . ALPRAZolam Prudy Feeler) tablet 0.25 mg  0.25 mg Oral BID PRN Brittainy Simmons, PA-C      . antiseptic oral rinse (BIOTENE) solution 15 mL  15 mL Mouth Rinse BID Chrystie Nose, MD   15 mL at 01/17/13 0800  . camphor-menthol (SARNA) lotion   Topical PRN Zigmund Gottron, MD      . darbepoetin [facility]) injection 200 mcg  200 mcg Intravenous Q Thu-HD Trevor Iha, MD   200 mcg at 01/15/13 1446  . doxycycline (VIBRA-TABS) tablet 100 mg  100 mg Oral Q12H Sadie Haber, MD   100 mg at 01/16/13 2203  . feeding supplement (NEPRO CARB  STEADY) liquid 237 mL  237 mL Oral PRN Trevor Iha, MD      . feeding supplement (RESOURCE BREEZE) liquid 1 Container  1 Container Oral BID BM Tonye Becket, RD   1 Container at 01/16/13 1403  . ferric gluconate (NULECIT) 125 mg in sodium chloride 0.9 % 100 mL IVPB  125 mg Intravenous Q T,Th,Sa-HD Trevor Iha, MD   125 mg at 01/15/13 1719  . heparin injection 1,000 Units  1,000 Units Dialysis PRN Trevor Iha, MD      . insulin aspart (novoLOG) injection 0-9 Units  0-9 Units Subcutaneous TID WC Chrystie Nose, MD   2 Units at 01/14/13 1230  . insulin aspart protamine- aspart (NOVOLOG 70/30) injection 10 Units  10 Units Subcutaneous Q breakfast Chrystie Nose, MD   10 Units at 01/16/13 0848  . insulin aspart protamine- aspart (NOVOLOG 70/30) injection 5 Units  5 Units Subcutaneous Q supper Chrystie Nose, MD   5 Units at 01/16/13 1759  . lidocaine (PF) (XYLOCAINE) 1 % injection 5 mL  5 mL Intradermal PRN Trevor Iha, MD      . lidocaine-prilocaine (EMLA) cream 1 application  1 application Topical PRN Trevor Iha, MD      . multivitamin (  RENA-VIT) tablet 1 tablet  1 tablet Oral QHS Chrystie Nose, MD      . ondansetron [facility]) injection 4 mg  4 mg Intravenous Q6H PRN Brittainy Simmons, PA-C   4 mg at 01/14/13 0113  . oxyCODONE (Oxy IR/ROXICODONE) immediate release tablet 5-10 mg  5-10 mg Oral Q2H PRN Regina J Roczniak, PA-C      . pantoprazole (PROTONIX) EC tablet 40 mg  40 mg Oral Daily Brittainy Simmons, PA-C   40 mg at 01/16/13 0959  . pentafluoroprop-tetrafluoroeth (GEBAUERS) aerosol 1 application  1 application Topical PRN Trevor Iha, MD      . phenylephrine (NEO-SYNEPHRINE) 40,000 mcg in dextrose 5 % 250 mL infusion  30-200 mcg/min Intravenous Titrated Chrystie Nose, MD   20 mcg/min at 01/17/13 0000  . phenytoin (DILANTIN) ER capsule 300 mg  300 mg Oral QHS Brittainy Simmons, PA-C   300 mg at 01/16/13 2203  . senna-docusate (Senokot-S) tablet 1  tablet  1 tablet Oral QHS PRN Robbie Lis, PA-C   1 tablet at 01/05/13 0958  . simvastatin (ZOCOR) tablet 10 mg  10 mg Oral QHS Brittainy Simmons, PA-C   10 mg at 01/16/13 2203  . sodium chloride 0.9 % injection 3 mL  3 mL Intravenous Q12H Brittainy Simmons, PA-C   3 mL at 01/16/13 2204  . sodium chloride 0.9 % injection 3 mL  3 mL Intravenous PRN Brittainy Simmons, PA-C      . zolpidem (AMBIEN) tablet 5 mg  5 mg Oral QHS PRN Robbie Lis, PA-C        PE: General appearance: alert, appears stated age and no distress Lungs: clear to auscultation bilaterally Heart: regular rate and rhythm and 2/6 Murmur Extremities: no LEE Pulses: 2+ and symmetric Skin: warm and dry Neurologic: Grossly normal  Lab Results:   Recent Labs  01/15/13 0940 01/16/13 0500 01/17/13 0345  WBC 26.4* 24.5* 17.5*  HGB 8.2* 10.2* 10.2*  HCT 25.3* 30.0* 30.1*  PLT 183 196 131*   BMET  Recent Labs  01/15/13 1139  NA 133*  K 4.4  CL 98  CO2 22  GLUCOSE 188*  BUN 45*  CREATININE 2.44*  CALCIUM 8.1*   PT/INR  Recent Labs  01/15/13 1030 01/16/13 0500 01/17/13 0345  LABPROT 22.5* 23.3* 21.8*  INR 2.05* 2.15* 1.97*    Assessment/Plan  Principal Problem:   Acute on chronic diastolic CHF (congestive heart failure), NYHA class 3, 09/25/12 Active Problems:   Sleep apnea on c-pap    Aortic insufficiency: mild   Chronic venous insufficiency with LE ulcers, will be followed at wound center   Diabetes mellitus type 2 in obese   Hypertrophic obstructive cardiomyopathy(425.11)   History of Rt brain CVA   PAF (paroxysmal atrial fibrillation),    Long term (current) use of anticoagulants   Chronic kidney disease (CKD), stage IV (severe), now with HD   Cardiorenal syndrome with renal failure   Cardiogenic shock   Hypotension   Acute blood loss anemia  Plan: Significant improvement in last 24 hours.  Hypotension has resolved. She is currently off of neo-synepherine. Will discontinue  A-line. She has been a hypertensive as well. Plan for HD today. H/H is stable. Left upper extremity pain and swelling is improved.     LOS: 18 days    Brittainy M. Sharol Harness, PA-C 01/17/2013 9:07 AM  I have seen and evaluated the patient this AM along with Boyce Medici, PA. I agree with her findings, examination as well  as impression recommendations.  Looks much better today.  BP more stable.  Off of pressors.  Can d/c A line & send for HD - maybe able to pull some volume. But mostly seems euvolemic. IF BP still stable post HD - txfr to Tele.  OOB to chair.   Would like to re-institute BB, but will hold until BP proves stable x 24 hr.  Rhythm stable - no further VT Afib.   MD Time with pt: 10 min  Zigmond Trela W, M.D., M.S. [facility] [address] [place], [place]  [address]  [phone] Pager # [phone] 01/17/2013 9:30 AM

## 2013-01-17 NOTE — Progress Notes (Signed)
ANTICOAGULATION CONSULT NOTE - Follow Up Consult  Pharmacy Consult for Warfarin (resuming today) Indication: Hx Afib/DVT  No Known Allergies  Patient Measurements: Height: 5\' 5"  (165.1 cm) Weight: 221 lb 9 oz (100.5 kg) IBW/kg (Calculated) : 57  Vital Signs: Temp: 96.8 F (36 C) (07/05 1247) Temp src: Oral (07/05 1247) BP: 92/43 mmHg (07/05 1307) Pulse Rate: 72 (07/05 1307)  Labs:  Recent Labs  01/15/13 0940 01/15/13 1030 01/15/13 1139 01/16/13 0500 01/17/13 0345  HGB 8.2*  --   --  10.2* 10.2*  HCT 25.3*  --   --  30.0* 30.1*  PLT 183  --   --  196 131*  LABPROT  --  22.5*  --  23.3* 21.8*  INR  --  2.05*  --  2.15* 1.97*  CREATININE  --   --  2.44*  --   --     Estimated Creatinine Clearance: 22.7 ml/min (by C-G formula based on Cr of 2.44).   Assessment: 79 y.o. F with warfarin resumed from PTA for hx Afib/DVT. Pt with AVF placed 6/30 and noted to have large LUE hematoma and small forearm hematoma on 7/3. Warfarin was held on 7/3 and 7/4 evening per VVS. INR this morning is slightly SUBtherapeutic (INR 1.97 << 2.15, goal of 2-3). Hgb/Hct stable, plts 131 << 196, pt is s/p 2 units PRBC on 6/3. Hematoma appears to be stable -- no new s/sx of bleeding noted at this time.   Goal of Therapy:  INR 2-3   Plan:  1. Warfarin 2 mg x 1 dose at 1800 today 2. Will continue to monitor for any signs/symptoms of bleeding and will follow up with PT/INR in the a.m.   Georgina Pillion, PharmD, BCPS Clinical Pharmacist Pager: [phone] 01/17/2013 1:17 PM

## 2013-01-18 LAB — CBC
HCT: 31 % — ABNORMAL LOW (ref 36.0–46.0)
MCHC: 32.9 g/dL (ref 30.0–36.0)
RDW: 20.2 % — ABNORMAL HIGH (ref 11.5–15.5)
WBC: 19 10*3/uL — ABNORMAL HIGH (ref 4.0–10.5)

## 2013-01-18 LAB — BASIC METABOLIC PANEL
Calcium: 8.1 mg/dL — ABNORMAL LOW (ref 8.4–10.5)
Creatinine, Ser: 1.78 mg/dL — ABNORMAL HIGH (ref 0.50–1.10)
GFR calc Af Amer: 31 mL/min — ABNORMAL LOW (ref >90)
GFR calc non Af Amer: 26 mL/min — ABNORMAL LOW (ref >90)
Sodium: 134 mEq/L — ABNORMAL LOW (ref 135–145)

## 2013-01-18 LAB — GLUCOSE, CAPILLARY
Glucose-Capillary: 117 mg/dL — ABNORMAL HIGH (ref 70–99)
Glucose-Capillary: 131 mg/dL — ABNORMAL HIGH (ref 70–99)
Glucose-Capillary: 83 mg/dL (ref 70–99)

## 2013-01-18 LAB — PROTIME-INR
INR: 2.22 — ABNORMAL HIGH (ref 0.00–1.49)
Prothrombin Time: 23.9 seconds — ABNORMAL HIGH (ref 11.6–15.2)

## 2013-01-18 MED ORDER — WARFARIN SODIUM 1 MG PO TABS
1.0000 mg | ORAL_TABLET | Freq: Once | ORAL | Status: AC
  Administered 2013-01-18: 1 mg via ORAL
  Filled 2013-01-18: qty 1

## 2013-01-18 MED ORDER — SODIUM CHLORIDE 0.9 % IJ SOLN
10.0000 mL | Freq: Two times a day (BID) | INTRAMUSCULAR | Status: DC
  Administered 2013-01-18 (×2): 10 mL via INTRAVENOUS
  Administered 2013-01-19: 3 mL via INTRAVENOUS
  Administered 2013-01-19 – 2013-01-20 (×2): 10 mL via INTRAVENOUS
  Administered 2013-01-20: 30 mL via INTRAVENOUS
  Administered 2013-01-21 – 2013-01-22 (×2): 10 mL via INTRAVENOUS

## 2013-01-18 NOTE — Progress Notes (Signed)
RT was told pt refused CPAP for multiple night. RT asked patient and she has refused for 3rd night. CPAP is at bedside. RT explained importance of CPAP and pt continued to refuse. RT will pull machine at later time tonight. RT will also continue to monitor patient.

## 2013-01-18 NOTE — Progress Notes (Signed)
Subjective: Interval History: has complaints hungry.  Objective: Vital signs in last 24 hours: Temp:  [96.8 F (36 C)-97.5 F (36.4 C)] 97.5 F (36.4 C) (07/06 0400) Pulse Rate:  [72-92] 78 (07/06 0700) Resp:  [10-28] 11 (07/06 0700) BP: (78-116)/(22-55) 99/37 mmHg (07/06 0700) SpO2:  [92 %-100 %] 99 % (07/06 0700) Arterial Line BP: (171)/(57) 171/57 mmHg (07/05 0900) Weight:  [98.9 kg (218 lb 0.6 oz)-100.5 kg (221 lb 9 oz)] 98.9 kg (218 lb 0.6 oz) (07/06 0443) Weight change: 0.7 kg (1 lb 8.7 oz)  Intake/Output from previous day: 07/05 0701 - 07/06 0700 In: 655 [P.O.:525; I.V.:20; IV Piggyback:110] Out: 1 [Urine:1] Intake/Output this shift:    General appearance: alert, cooperative and moderately obese Resp: diminished breath sounds bilaterally and rales bibasilar Cardio: regular rate and rhythm and systolic murmur: holosystolic 2/6, blowing at apex GI: obese, pos bs, liver down 5 cm Extremities: edema 2+ Arm henatoma not tight,extends to LA, Pos B&T  Lab Results:  Recent Labs  01/17/13 0345 01/18/13 0400  WBC 17.5* 19.0*  HGB 10.2* 10.2*  HCT 30.1* 31.0*  PLT 131* 96*   BMET:  Recent Labs  01/17/13 1256 01/18/13 0400  NA 132* 134*  K 3.9 4.3  CL 96 99  CO2 24 26  GLUCOSE 104* 130*  BUN 39* 22  CREATININE 2.67* 1.78*  CALCIUM 8.4 8.1*   No results found for this basename: PTH,  in the last 72 hours Iron Studies: No results found for this basename: IRON, TIBC, TRANSFERRIN, FERRITIN,  in the last 72 hours  Studies/Results: No results found.  I have reviewed the patient's current medications.  Assessment/Plan: 1CRF stable at HD. Very vol sensitive 2 RHF as above 3 CAD stable 4 DM controlled 5 Anemia stable on epo/fe 6 Obesity 7 COPD/OSA 8 HPTH P HD TTS, mobilize, epo/fe,      LOS: 19 days   Tarrah Furuta L 01/18/2013,8:26 AM

## 2013-01-18 NOTE — Progress Notes (Signed)
ANTICOAGULATION CONSULT NOTE - Follow Up Consult  Pharmacy Consult for Warfarin Indication: Hx Afib/DVT  No Known Allergies  Patient Measurements: Height: 5\' 5"  (165.1 cm) Weight: 218 lb 0.6 oz (98.9 kg) IBW/kg (Calculated) : 57  Vital Signs: Temp: 97.8 F (36.6 C) (07/06 0800) Temp src: Axillary (07/06 0800) BP: 99/37 mmHg (07/06 0700) Pulse Rate: 78 (07/06 0700)  Labs:  Recent Labs  01/15/13 1139  01/16/13 0500 01/17/13 0345 01/17/13 1256 01/18/13 0400  HGB  --   < > 10.2* 10.2*  --  10.2*  HCT  --   --  30.0* 30.1*  --  31.0*  PLT  --   --  196 131*  --  96*  LABPROT  --   --  23.3* 21.8*  --  23.9*  INR  --   --  2.15* 1.97*  --  2.22*  CREATININE 2.44*  --   --   --  2.67* 1.78*  < > = values in this interval not displayed.  Estimated Creatinine Clearance: 30.8 ml/min (by C-G formula based on Cr of 1.78).   Assessment: 79 y.o. F with warfarin resumed from PTA for hx Afib/DVT. Pt with AVF placed 6/30 and noted to have large LUE hematoma and small forearm hematoma on 7/3. Warfarin was held on 7/3 and 7/4 evening per VVS and resumed 7/5. INR this morning is therapeutic (INR 2.22 << 1.97, goal of 2-3). Hgb/Hct stable, plts 96 << 131, pt is s/p 2 units PRBC on 6/3. The patient is moderate-high risk for HIT due to onset, % drop (pt still receiving heparin with HD treatments)-- discussed with Dr. Herbie Baltimore and HIT panel sent. Hematoma appears to be stable/improved -- no new s/sx of bleeding noted at this time.   Goal of Therapy:  INR 2-3   Plan:  1. Warfarin 1 mg x 1 dose at 1800 today 2. Will continue to monitor for any signs/symptoms of bleeding and will follow up with PT/INR in the a.m.   Georgina Pillion, PharmD, BCPS Clinical Pharmacist Pager: [phone] 01/18/2013 10:06 AM

## 2013-01-18 NOTE — Progress Notes (Addendum)
[facility]  Subjective: No complaints. Left arm pain and swelling has improved.  Objective: Vital signs in last 24 hours: Temp:  [96.8 F (36 C)-97.5 F (36.4 C)] 97.5 F (36.4 C) (07/06 0400) Pulse Rate:  [72-92] 78 (07/06 0700) Resp:  [10-28] 11 (07/06 0700) BP: (78-116)/(22-55) 99/37 mmHg (07/06 0700) SpO2:  [92 %-100 %] 99 % (07/06 0700) Arterial Line BP: (171)/(57) 171/57 mmHg (07/05 0900) Weight:  [218 lb 0.6 oz (98.9 kg)-221 lb 9 oz (100.5 kg)] 218 lb 0.6 oz (98.9 kg) (07/06 0443) Last BM Date: 01/12/13  Intake/Output from previous day: 07/05 0701 - 07/06 0700 In: 655 [P.O.:525; I.V.:20; IV Piggyback:110] Out: 1 [Urine:1] Intake/Output this shift:    Medications Current Facility-Administered Medications  Medication Dose Route Frequency Provider Last Rate Last Dose  . 0.9 %  sodium chloride infusion  250 mL Intravenous PRN Brittainy Simmons, PA-C 10 mL/hr at 01/12/13 1900 250 mL at 01/12/13 1900  . 0.9 %  sodium chloride infusion   Intravenous Continuous Judie Petit, MD      . 0.9 %  sodium chloride infusion  100 mL Intravenous PRN Llana Aliment Deterding, MD      . 0.9 %  sodium chloride infusion  100 mL Intravenous PRN Llana Aliment Deterding, MD      . 0.9 %  sodium chloride infusion  100 mL Intravenous PRN Llana Aliment Deterding, MD      . 0.9 %  sodium chloride infusion  100 mL Intravenous PRN Trevor Iha, MD      . acetaminophen (TYLENOL) tablet 650 mg  650 mg Oral Q4H PRN Brittainy Simmons, PA-C      . allopurinol (ZYLOPRIM) tablet 150 mg  150 mg Oral Daily Brittainy Simmons, PA-C   150 mg at 01/17/13 0908  . ALPRAZolam Prudy Feeler) tablet 0.25 mg  0.25 mg Oral BID PRN Brittainy Simmons, PA-C      . antiseptic oral rinse (BIOTENE) solution 15 mL  15 mL Mouth Rinse BID Chrystie Nose, MD   15 mL at 01/17/13 2000  . camphor-menthol (SARNA) lotion   Topical PRN Zigmund Gottron, MD      . darbepoetin [facility]) injection 200 mcg  200 mcg  Intravenous Q Thu-HD Trevor Iha, MD   200 mcg at 01/15/13 1446  . doxycycline (VIBRA-TABS) tablet 100 mg  100 mg Oral Q12H Sadie Haber, MD   100 mg at 01/17/13 2139  . feeding supplement (NEPRO CARB STEADY) liquid 237 mL  237 mL Oral PRN Trevor Iha, MD      . feeding supplement (RESOURCE BREEZE) liquid 1 Container  1 Container Oral BID BM Tonye Becket, RD   1 Container at 01/17/13 1000  . ferric gluconate (NULECIT) 125 mg in sodium chloride 0.9 % 100 mL IVPB  125 mg Intravenous Q T,Th,Sa-HD Trevor Iha, MD   125 mg at 01/17/13 1454  . heparin injection 1,000 Units  1,000 Units Dialysis PRN Trevor Iha, MD      . heparin injection 1,000 Units  1,000 Units Dialysis PRN Trevor Iha, MD      . insulin aspart (novoLOG) injection 0-9 Units  0-9 Units Subcutaneous TID WC Chrystie Nose, MD   2 Units at 01/14/13 1230  . insulin aspart protamine- aspart (NOVOLOG 70/30) injection 10 Units  10 Units Subcutaneous Q breakfast Chrystie Nose, MD   10 Units at 01/16/13 0848  . insulin aspart protamine- aspart (  NOVOLOG 70/30) injection 5 Units  5 Units Subcutaneous Q supper Chrystie Nose, MD   5 Units at 01/16/13 1759  . lidocaine (PF) (XYLOCAINE) 1 % injection 5 mL  5 mL Intradermal PRN Trevor Iha, MD      . lidocaine-prilocaine (EMLA) cream 1 application  1 application Topical PRN Trevor Iha, MD      . multivitamin (RENA-VIT) tablet 1 tablet  1 tablet Oral QHS Chrystie Nose, MD      . ondansetron [facility]) injection 4 mg  4 mg Intravenous Q6H PRN Brittainy Simmons, PA-C   4 mg at 01/14/13 0113  . oxyCODONE (Oxy IR/ROXICODONE) immediate release tablet 5-10 mg  5-10 mg Oral Q2H PRN Regina J Roczniak, PA-C      . pantoprazole (PROTONIX) EC tablet 40 mg  40 mg Oral Daily Brittainy Simmons, PA-C   40 mg at 01/17/13 0908  . pentafluoroprop-tetrafluoroeth (GEBAUERS) aerosol 1 application  1 application Topical PRN Trevor Iha, MD      . phenylephrine  (NEO-SYNEPHRINE) 40,000 mcg in dextrose 5 % 250 mL infusion  30-200 mcg/min Intravenous Titrated Chrystie Nose, MD   20 mcg/min at 01/17/13 0000  . phenytoin (DILANTIN) ER capsule 300 mg  300 mg Oral QHS Brittainy Simmons, PA-C   300 mg at 01/17/13 2139  . senna-docusate (Senokot-S) tablet 1 tablet  1 tablet Oral QHS PRN Robbie Lis, PA-C   1 tablet at 01/05/13 0958  . simvastatin (ZOCOR) tablet 10 mg  10 mg Oral QHS Brittainy Simmons, PA-C   10 mg at 01/17/13 2139  . sodium chloride 0.9 % injection 3 mL  3 mL Intravenous Q12H Brittainy Simmons, PA-C   3 mL at 01/17/13 2140  . sodium chloride 0.9 % injection 3 mL  3 mL Intravenous PRN Brittainy Simmons, PA-C      . Warfarin - Pharmacist Dosing Inpatient   Does not apply q1800 Ann Held, RPH      . zolpidem (AMBIEN) tablet 5 mg  5 mg Oral QHS PRN Robbie Lis, PA-C        PE: General appearance: alert, appears stated age and no distress Lungs: clear to auscultation bilaterally; non-labored. Heart: regular rate and rhythm and 2-3/6 Systolic Murmur Extremities: no LEE; L UE 0 still with notable hematoma, but overall swelling seems diminished. Pulses: 2+ and symmetric Skin: warm and dry Neurologic: Grossly normal Abd: soft, NT/ND/NABS  Lab Results:   Recent Labs  01/16/13 0500 01/17/13 0345 01/18/13 0400  WBC 24.5* 17.5* 19.0*  HGB 10.2* 10.2* 10.2*  HCT 30.0* 30.1* 31.0*  PLT 196 131* 96*   BMET  Recent Labs  01/15/13 1139 01/17/13 1256 01/18/13 0400  NA 133* 132* 134*  K 4.4 3.9 4.3  CL 98 96 99  CO2 22 24 26   GLUCOSE 188* 104* 130*  BUN 45* 39* 22  CREATININE 2.44* 2.67* 1.78*  CALCIUM 8.1* 8.4 8.1*   PT/INR  Recent Labs  01/16/13 0500 01/17/13 0345 01/18/13 0400  LABPROT 23.3* 21.8* 23.9*  INR 2.15* 1.97* 2.22*    Assessment/Plan  Principal Problem:   Acute on chronic diastolic CHF (congestive heart failure), NYHA class 3, 09/25/12 Active Problems:   Hypertrophic obstructive  cardiomyopathy(425.11)   Cardiogenic shock   Acute blood loss anemia   Sleep apnea on c-pap    Chronic venous insufficiency with LE ulcers, will be followed at wound center   Diabetes mellitus type 2 in obese   Aortic insufficiency: mild  History of Rt brain CVA   PAF (paroxysmal atrial fibrillation),    Long term (current) use of anticoagulants   Chronic kidney disease (CKD), stage IV (severe), now with HD   Cardiorenal syndrome with renal failure   Hypotension  Continues to be stable off pressors.  Still with borderlin BP, but this is her baseline.  HD today - with filtration only, no volume removal.  She seems euvolemic.  Just weak. H/H is stable x 3 days.  No further sign of bleed. Left upper extremity pain and swelling is improved.  Rhythm stable - no further VT Afib. She will need low dose BB - will restart today, while monitored on ~stepdown status.    Can d/c Doxycycline - for cellulitis. Glycemic control is excellent on current regimen, will need to start moving towards a d/c regimen.  OOB-Chair & begin to mobilize. - PT/OT. INR remains therapeutic  Platelet count has been dropping - will check HITT panel   LOS: 19 days    MD Time with pt: 10 min  Emilianna Barlowe W, M.D., M.S. [facility] [address] [place], [place]  [address]  [phone] Pager # [phone] 01/18/2013 8:07 AM

## 2013-01-19 LAB — CBC
HCT: 31.9 % — ABNORMAL LOW (ref 36.0–46.0)
Hemoglobin: 10.4 g/dL — ABNORMAL LOW (ref 12.0–15.0)
RBC: 3.28 MIL/uL — ABNORMAL LOW (ref 3.87–5.11)
RDW: 21.7 % — ABNORMAL HIGH (ref 11.5–15.5)
WBC: 17.9 10*3/uL — ABNORMAL HIGH (ref 4.0–10.5)

## 2013-01-19 LAB — BASIC METABOLIC PANEL
CO2: 27 mEq/L (ref 19–32)
Calcium: 8.4 mg/dL (ref 8.4–10.5)
Chloride: 100 mEq/L (ref 96–112)
Sodium: 135 mEq/L (ref 135–145)

## 2013-01-19 LAB — PROTIME-INR: INR: 2.44 — ABNORMAL HIGH (ref 0.00–1.49)

## 2013-01-19 LAB — GLUCOSE, CAPILLARY
Glucose-Capillary: 122 mg/dL — ABNORMAL HIGH (ref 70–99)
Glucose-Capillary: 93 mg/dL (ref 70–99)
Glucose-Capillary: 111 mg/dL — ABNORMAL HIGH (ref 70–99)

## 2013-01-19 MED ORDER — WARFARIN 1.25 MG HALF TABLET
1.2500 mg | ORAL_TABLET | Freq: Once | ORAL | Status: AC
  Administered 2013-01-19: 1.25 mg via ORAL
  Filled 2013-01-19: qty 1

## 2013-01-19 NOTE — Progress Notes (Signed)
NUTRITION FOLLOW UP  Intervention:   1. Supplements; Resource Breeze po BID, each supplement provides 250 kcal and 9 grams of protein.  Nepro Shake po daily, each supplement provides 425 kcal and 19 grams protein. 2. Brief education; initiated discussion regarding diet order and potential for long-term need to follow diet.  Discussed goals of current diet order. 3.  Meals/snacks; RD to assist with ordering meals to increase variety.   Nutrition Dx:   Inadequate oral intake related to decreased appetite as evidenced by diet recall.  Ongoing  Goal:   PO intake to meet >/=90% estimated nutrition needs. Unmet  Monitor:   PO intake, weight trends, labs, I/O's  Assessment:   Pt HD dependent.  RD consulted for poor PO intake.  Met with pt who reports good appetite, however poor intake r/t food preferences and lack of variety with current diet order.  RD offered assistance in ordering which pt accepts.  Pt is drinking Breeze BID consistently and is agreeable to Nepro daily.  Height: Ht Readings from Last 1 Encounters:  01/12/13 5\' 5"  (1.651 m)    Weight Status:   Wt Readings from Last 1 Encounters:  01/19/13 220 lb 0.3 oz (99.8 kg)  Down from 256 on admission.   Re-estimated needs:  Kcal: 2300-2500 Protein: >/=103 gm Fluid: 1.2 L   Skin: bilateral lower leg wounds related to edema, new incisions r/t access  Diet Order: Renal 80-90   Intake/Output Summary (Last 24 hours) at 01/19/13 1429 Last data filed at 01/19/13 0900  Gross per 24 hour  Intake    490 ml  Output     50 ml  Net    440 ml   Last BM: 7/6   Labs:   Recent Labs Lab 01/13/13 0344 01/15/13 1139 01/17/13 1256 01/18/13 0400 01/19/13 0451  NA 136 133* 132* 134* 135  K 4.3 4.4 3.9 4.3 4.2  CL 100 98 96 99 100  CO2 24 22 24 26 27   BUN 44* 45* 39* 22 34*  CREATININE 1.94* 2.44* 2.67* 1.78* 2.43*  CALCIUM 8.2* 8.1* 8.4 8.1* 8.4  PHOS 5.1* 5.2* 4.1  --   --   GLUCOSE 110* 188* 104* 130* 121*    CBG  (last 3)   Recent Labs  01/18/13 2127 01/19/13 0744 01/19/13 1136  GLUCAP 83 103* 122*    Scheduled Meds: . allopurinol  150 mg Oral Daily  . antiseptic oral rinse  15 mL Mouth Rinse BID  . darbepoetin (ARANESP) injection - DIALYSIS  200 mcg Intravenous Q Thu-HD  . feeding supplement  1 Container Oral BID BM  . ferric gluconate (FERRLECIT/NULECIT) IV  125 mg Intravenous Q T,Th,Sa-HD  . insulin aspart  0-9 Units Subcutaneous TID WC  . insulin aspart protamine- aspart  10 Units Subcutaneous Q breakfast  . insulin aspart protamine- aspart  5 Units Subcutaneous Q supper  . multivitamin  1 tablet Oral QHS  . pantoprazole  40 mg Oral Daily  . phenytoin  300 mg Oral QHS  . simvastatin  10 mg Oral QHS  . sodium chloride  10 mL Intravenous Q12H  . warfarin  1.25 mg Oral ONCE-1800  . Warfarin - Pharmacist Dosing Inpatient   Does not apply q1800    Continuous Infusions: . sodium chloride Stopped (01/17/13 0800)  . phenylephrine (NEO-SYNEPHRINE) Adult infusion Stopped (01/17/13 0100)

## 2013-01-19 NOTE — Progress Notes (Signed)
Physical Therapy Treatment Patient Details Name: [name] MRN: [id number] DOB: 08/30/1933 Today's Date: 01/19/2013 Time: 4098-1191 PT Time Calculation (min): 23 min  PT Assessment / Plan / Recommendation  PT Comments   Pt beginning to tolerate OOB mobility this date. Pt motivated. Pt remains deconditioned but demo's improved activity tolerance. Pt remains appropriate for SNF to address mentioned impairments to achieve safe mod I function for safe transition home.   Follow Up Recommendations  SNF     Does the patient have the potential to tolerate intense rehabilitation     Barriers to Discharge        Equipment Recommendations  Rolling walker with 5" wheels    Recommendations for Other Services    Frequency Min 3X/week   Progress towards PT Goals Progress towards PT goals: Progressing toward goals  Plan Current plan remains appropriate    Precautions / Restrictions Precautions Precautions: Fall Restrictions Weight Bearing Restrictions: No   Pertinent Vitals/Pain Pt c/o of L UE pain however alleviated once elevated on a pillow.    Mobility  Bed Mobility Bed Mobility: Supine to Sit;Sitting - Scoot to Edge of Bed Supine to Sit: 4: Min assist;With rails;HOB elevated Sitting - Scoot to [place] of Bed: 4: Min assist;With rail Details for Bed Mobility Assistance: assist at hips to sit EOB Transfers Transfers: Sit to Stand;Stand to Sit Sit to Stand: 2: Max assist;With upper extremity assist;From bed Stand to Sit: 2: Max assist;With upper extremity assist;To chair/3-in-1 Details for Transfer Assistance: pt with uncontrolled descent into chair, maxA to achieve anterior weight shift to achieve upright standing Ambulation/Gait Ambulation/Gait Assistance: 4: Min assist Ambulation Distance (Feet): 10 Feet Assistive device: Rolling walker Ambulation/Gait Assistance Details: 2nd person for line managment. pt with + SOB however first time ambulating in over a week Gait Pattern:  Step-through pattern;Decreased stride length;Shuffle Gait velocity: slow Stairs: No    Exercises General Exercises - Lower Extremity Ankle Circles/Pumps: AROM;Both;10 reps Quad Sets: AROM;Both;10 reps Gluteal Sets: AROM;5 reps;Both Heel Slides: AROM;Both;10 reps   PT Diagnosis:    PT Problem List:   PT Treatment Interventions:     PT Goals (current goals can now be found in the care plan section)    Visit Information  Last PT Received On: 01/19/13 Assistance Needed: +1 History of Present Illness: 79 yo female with CHF with HOCM presenting with anasarca and ARF in the setting of decompensated HF.    Subjective Data  Subjective: Pt received supine in bed agreeable to PT   Cognition  Cognition Arousal/Alertness: Awake/alert Behavior During Therapy: [facility] for tasks assessed/performed Overall Cognitive Status: Within Functional Limits for tasks assessed    Balance     End of Session PT - End of Session Equipment Utilized During Treatment: Gait belt;Oxygen (2Lo2 via [place]) Activity Tolerance: Patient limited by fatigue Patient left: in chair;with call bell/phone within reach;with family/visitor present Nurse Communication: Mobility status   GP     Marcene Brawn 01/19/2013, 1:36 PM  Lewis Shock, PT, DPT Pager #: [phone] Office #: [phone]

## 2013-01-19 NOTE — Clinical Social Work Note (Signed)
Clinical Social Worker is continuing to follow for SNF placement at [facility].   Rozetta Nunnery MSW, [employer] [phone]

## 2013-01-19 NOTE — Progress Notes (Signed)
Patient ID: SIGNORA ZUCCO, female   DOB: January 31, 1934, 79 y.o.   MRN: [id number]   [facility] Progress Note    Subjective:   Reports that she cannot find a comfortable position this morning-somnolent. Events overnight noted, she refused her CPAP (when asked about this-she states that she does not like it).    Objective:   BP 114/44  Pulse 92  Temp(Src) 97.3 F (36.3 C) (Oral)  Resp 21  Ht 5\' 5"  (1.651 m)  Wt 99.8 kg (220 lb 0.3 oz)  BMI 36.61 kg/m2  SpO2 100%  Physical Exam: Gen: Somnolent resting in bed-and drifts to sleep halfway through conversation CVS: Pulse regular in rate and rhythm, heart sounds S1 and S2 normal Resp: Diminished breath sounds over both bases-no rales Abd: Soft, obese, nontender and bowel sounds are normal Ext: bilateral lower extremity and compression wraps  Labs: BMET  Recent Labs Lab 01/13/13 0344 01/15/13 1139 01/17/13 1256 01/18/13 0400 01/19/13 0451  NA 136 133* 132* 134* 135  K 4.3 4.4 3.9 4.3 4.2  CL 100 98 96 99 100  CO2 24 22 24 26 27   GLUCOSE 110* 188* 104* 130* 121*  BUN 44* 45* 39* 22 34*  CREATININE 1.94* 2.44* 2.67* 1.78* 2.43*  CALCIUM 8.2* 8.1* 8.4 8.1* 8.4  PHOS 5.1* 5.2* 4.1  --   --    CBC  Recent Labs Lab 01/16/13 0500 01/17/13 0345 01/18/13 0400 01/19/13 0451  WBC 24.5* 17.5* 19.0* 17.9*  HGB 10.2* 10.2* 10.2* 10.4*  HCT 30.0* 30.1* 31.0* 31.9*  MCV 90.1 92.3 95.7 97.3  PLT 196 131* 96* 123*   Medications:    . allopurinol  150 mg Oral Daily  . antiseptic oral rinse  15 mL Mouth Rinse BID  . darbepoetin (ARANESP) injection - DIALYSIS  200 mcg Intravenous Q Thu-HD  . feeding supplement  1 Container Oral BID BM  . ferric gluconate (FERRLECIT/NULECIT) IV  125 mg Intravenous Q T,Th,Sa-HD  . insulin aspart  0-9 Units Subcutaneous TID WC  . insulin aspart protamine- aspart  10 Units Subcutaneous Q breakfast  . insulin aspart protamine- aspart  5 Units Subcutaneous Q supper  . multivitamin  1  tablet Oral QHS  . pantoprazole  40 mg Oral Daily  . phenytoin  300 mg Oral QHS  . simvastatin  10 mg Oral QHS  . sodium chloride  10 mL Intravenous Q12H  . Warfarin - Pharmacist Dosing Inpatient   Does not apply q1800    Assessment/ Plan:   1. Hypotension: Hemorrhagic shock with bleed into arm post-AVF placement. Now improved hemodynamically post correction. overall blood pressures have improved however intra-hemodialysis drop noted. 2. ESRD: on TTS dialysis schedule and set up for continued HD upon DC at [facility]. may possibly be suitable enough for discharge later today or tomorrow after dialysis depending on how she does. 3. Anemia:Hgb stable and improved s/p PRBCs- now on IV Fe and ESA.  4. CKD-MBD: PTH levels at goal for ESRD, will monitor phosphorus levels. 5. Nutrition: continue supplementation for now. Remains hypoalbuminemic.    Zetta Bills, MD 01/19/2013, 7:59 AM

## 2013-01-19 NOTE — Progress Notes (Signed)
Patient ID: [name], female   DOB: [birth year], 79 y.o.   MRN: [id number] Denies arm pain or steal symptoms.  Hematoma in anticubital space now diffusing into forearm and upper arm.  Stable from vasc standpoint

## 2013-01-19 NOTE — Progress Notes (Signed)
Pt refuses CPAP, saying it chokes her.  Rt informed pt it was available if she changes her mind.

## 2013-01-19 NOTE — Progress Notes (Signed)
[facility]  Subjective: Pt states she feels "OK". Her left arm is less painful. No complaints.   Objective: Vital signs in last 24 hours: Temp:  [97.3 F (36.3 C)-98.3 F (36.8 C)] 97.3 F (36.3 C) (07/07 0741) Pulse Rate:  [76-92] 92 (07/07 0741) Resp:  [12-27] 21 (07/07 0341) BP: (91-124)/(33-89) 114/44 mmHg (07/07 0741) SpO2:  [85 %-100 %] 100 % (07/07 0741) Weight:  [220 lb 0.3 oz (99.8 kg)] 220 lb 0.3 oz (99.8 kg) (07/07 0500) Last BM Date: 01/18/13  Intake/Output from previous day: 07/06 0701 - 07/07 0700 In: 850 [P.O.:820; I.V.:30] Out: 250 [Urine:250] Intake/Output this shift:    Medications Current Facility-Administered Medications  Medication Dose Route Frequency Provider Last Rate Last Dose  . 0.9 %  sodium chloride infusion   Intravenous Continuous Judie Petit, MD      . acetaminophen (TYLENOL) tablet 650 mg  650 mg Oral Q4H PRN Cheresa Siers, PA-C      . allopurinol (ZYLOPRIM) tablet 150 mg  150 mg Oral Daily Maragret Vanacker, PA-C   150 mg at 01/18/13 1021  . ALPRAZolam Prudy Feeler) tablet 0.25 mg  0.25 mg Oral BID PRN Nettie Cromwell, PA-C      . antiseptic oral rinse (BIOTENE) solution 15 mL  15 mL Mouth Rinse BID Chrystie Nose, MD   15 mL at 01/18/13 2000  . camphor-menthol (SARNA) lotion   Topical PRN Zigmund Gottron, MD      . darbepoetin [facility]) injection 200 mcg  200 mcg Intravenous Q Thu-HD Trevor Iha, MD   200 mcg at 01/15/13 1446  . feeding supplement (RESOURCE BREEZE) liquid 1 Container  1 Container Oral BID BM Tonye Becket, RD   1 Container at 01/18/13 1400  . ferric gluconate (NULECIT) 125 mg in sodium chloride 0.9 % 100 mL IVPB  125 mg Intravenous Q T,Th,Sa-HD Trevor Iha, MD   125 mg at 01/17/13 1454  . insulin aspart (novoLOG) injection 0-9 Units  0-9 Units Subcutaneous TID WC Chrystie Nose, MD   1 Units at 01/18/13 1736  . insulin aspart protamine- aspart (NOVOLOG 70/30)  injection 10 Units  10 Units Subcutaneous Q breakfast Chrystie Nose, MD   10 Units at 01/18/13 0800  . insulin aspart protamine- aspart (NOVOLOG 70/30) injection 5 Units  5 Units Subcutaneous Q supper Chrystie Nose, MD   5 Units at 01/18/13 1735  . multivitamin (RENA-VIT) tablet 1 tablet  1 tablet Oral QHS Chrystie Nose, MD   1 tablet at 01/18/13 2150  . ondansetron (ZOFRAN) injection 4 mg  4 mg Intravenous Q6H PRN Clotine Heiner, PA-C   4 mg at 01/14/13 0113  . oxyCODONE (Oxy IR/ROXICODONE) immediate release tablet 5-10 mg  5-10 mg Oral Q2H PRN Regina J Roczniak, PA-C      . pantoprazole (PROTONIX) EC tablet 40 mg  40 mg Oral Daily Myliyah Rebuck, PA-C   40 mg at 01/18/13 1021  . phenylephrine (NEO-SYNEPHRINE) 40,000 mcg in dextrose 5 % 250 mL infusion  30-200 mcg/min Intravenous Titrated Chrystie Nose, MD   20 mcg/min at 01/17/13 0000  . phenytoin (DILANTIN) ER capsule 300 mg  300 mg Oral QHS Jabria Loos, PA-C   300 mg at 01/18/13 2149  . senna-docusate (Senokot-S) tablet 1 tablet  1 tablet Oral QHS PRN Robbie Lis, PA-C   1 tablet at 01/05/13 0958  . simvastatin (ZOCOR) tablet 10 mg  10 mg Oral QHS Robbie Lis,  PA-C   10 mg at 01/18/13 2150  . sodium chloride 0.9 % injection 10 mL  10 mL Intravenous Q12H Marykay Lex, MD   10 mL at 01/18/13 2100  . Warfarin - Pharmacist Dosing Inpatient   Does not apply q1800 Ann Held, [facility]      . zolpidem [facility]) tablet 5 mg  5 mg Oral QHS PRN Robbie Lis, PA-C   5 mg at 01/18/13 2245    PE: General appearance: cooperative, no distress and somnolent Lungs: clear to auscultation bilaterally Heart: regular rate and rhythm, 2/6 SM Extremities: no LEE Pulses: 2+ and symmetric Skin: warm and dry Neurologic: Grossly normal  Lab Results:   Recent Labs  01/17/13 0345 01/18/13 0400 01/19/13 0451  WBC 17.5* 19.0* 17.9*  HGB 10.2* 10.2* 10.4*  HCT 30.1* 31.0* 31.9*  PLT 131* 96* 123*   BMET  Recent  Labs  01/17/13 1256 01/18/13 0400 01/19/13 0451  NA 132* 134* 135  K 3.9 4.3 4.2  CL 96 99 100  CO2 24 26 27   GLUCOSE 104* 130* 121*  BUN 39* 22 34*  CREATININE 2.67* 1.78* 2.43*  CALCIUM 8.4 8.1* 8.4   PT/INR  Recent Labs  01/17/13 0345 01/18/13 0400 01/19/13 0451  LABPROT 21.8* 23.9* 25.7*  INR 1.97* 2.22* 2.44*     Assessment/Plan  Principal Problem:   Acute on chronic diastolic CHF (congestive heart failure), NYHA class 3, 09/25/12 Active Problems:   Sleep apnea on c-pap    Aortic insufficiency: mild   Chronic venous insufficiency with LE ulcers, will be followed at wound center   Diabetes mellitus type 2 in obese   Hypertrophic obstructive cardiomyopathy(425.11)   History of Rt brain CVA   PAF (paroxysmal atrial fibrillation),    Long term (current) use of anticoagulants   Chronic kidney disease (CKD), stage IV (severe), now with HD   Cardiorenal syndrome with renal failure   Cardiogenic shock   Hypotension   Acute blood loss anemia  Plan: Her BP remains stable off pressors. Most recent BP is 114/44.  NSR on telemetry. No further arrthymias. Dr. Herbie Baltimore mentioned in his note yesterday restarting on low dose BB, however do not see that it has been restarted. Will defer decision with her primary Dr. Rennis Golden. Her BP may be able to tolerate, but the patient does appear a bit somnolent today, which may be reason to hold off further. H/H remains stable and is trending up slightly at 10.4/31.9. Nephrology is following for anemia and ESRD. Continue with IV Fe and HD, per nephrology. Left arm appears to be improving.      LOS: 20 days    Sevan Mcbroom M. Sharol Harness, PA-C 01/19/2013 8:28 AM

## 2013-01-19 NOTE — Progress Notes (Signed)
Pt. Seen and examined. Agree with the NP/PA-C note as written. H/H stable - off pressors. I'm okay with a HR <100 if BP stable. No further VT. Would hold on b-blocker due to restrictive LV physiology.  If she has further VT, would recommend amiodarone for rhythm control. Will need to work toward discharge. As social work to evaluate and PT as well. Ok to move to telemetry floor.  Chrystie Nose, MD, [facility] Attending Cardiologist [facility]

## 2013-01-19 NOTE — Progress Notes (Signed)
ANTICOAGULATION CONSULT NOTE - Follow Up Consult  Pharmacy Consult for Coumadin Indication: Hx Afib/DVT  Allergies  Allergen Reactions  . Heparin     HIT panel pending    Patient Measurements: Height: 5\' 5"  (165.1 cm) Weight: 220 lb 0.3 oz (99.8 kg) IBW/kg (Calculated) : 57  Vital Signs: Temp: 97.3 F (36.3 C) (07/07 0741) Temp src: Oral (07/07 0741) BP: 114/44 mmHg (07/07 0741) Pulse Rate: 92 (07/07 0741)  Labs:  Recent Labs  01/17/13 0345 01/17/13 1256 01/18/13 0400 01/19/13 0451  HGB 10.2*  --  10.2* 10.4*  HCT 30.1*  --  31.0* 31.9*  PLT 131*  --  96* 123*  LABPROT 21.8*  --  23.9* 25.7*  INR 1.97*  --  2.22* 2.44*  CREATININE  --  2.67* 1.78* 2.43*    Estimated Creatinine Clearance: 22.7 ml/min (by C-G formula based on Cr of 2.43).  Assessment: 79yof on coumadin pta for hx Afib/DVT. Vitamin K 2.5mg  po x 1 given 6/29 for AVF placement. AVF placed 6/30 and patient noted to have large LUE hematoma and small forearm hematoma on 7/3. Coumadin was held on 7/3 and 7/4 evening per VVS and resumed 7/5. INR this morning is therapeutic at 2.44. Hgb/Hct stable, plts 96-->123. The patient is moderate-high risk for HIT due to onset, % drop -  HIT panel pending. Hematoma appears to be stable/improved -- no new s/sx of bleeding noted at this time.   Goal of Therapy:  INR 2-3 Monitor platelets by anticoagulation protocol: Yes   Plan:  1) Coumadin 1.25mg  x 1 tonight 2) Follow up INR in AM 3) Follow up HIT panel  Fredrik Rigger 01/19/2013,9:28 AM

## 2013-01-20 LAB — BASIC METABOLIC PANEL
CO2: 25 mEq/L (ref 19–32)
Chloride: 99 mEq/L (ref 96–112)
Creatinine, Ser: 2.69 mg/dL — ABNORMAL HIGH (ref 0.50–1.10)
Potassium: 4 mEq/L (ref 3.5–5.1)

## 2013-01-20 LAB — GLUCOSE, CAPILLARY
Glucose-Capillary: 92 mg/dL (ref 70–99)
Glucose-Capillary: 140 mg/dL — ABNORMAL HIGH (ref 70–99)
Glucose-Capillary: 132 mg/dL — ABNORMAL HIGH (ref 70–99)

## 2013-01-20 LAB — RENAL FUNCTION PANEL
Albumin: 2.4 g/dL — ABNORMAL LOW (ref 3.5–5.2)
BUN: 42 mg/dL — ABNORMAL HIGH (ref 6–23)
Chloride: 99 mEq/L (ref 96–112)
GFR calc Af Amer: 19 mL/min — ABNORMAL LOW (ref >90)
GFR calc non Af Amer: 16 mL/min — ABNORMAL LOW (ref >90)
Potassium: 4 mEq/L (ref 3.5–5.1)
Sodium: 135 mEq/L (ref 135–145)

## 2013-01-20 LAB — CULTURE, BLOOD (ROUTINE X 2): Culture: NO GROWTH

## 2013-01-20 LAB — CBC
HCT: 33 % — ABNORMAL LOW (ref 36.0–46.0)
Hemoglobin: 10.7 g/dL — ABNORMAL LOW (ref 12.0–15.0)
MCH: 32 pg (ref 26.0–34.0)
MCHC: 32.4 g/dL (ref 30.0–36.0)

## 2013-01-20 LAB — PROTIME-INR: INR: 2.34 — ABNORMAL HIGH (ref 0.00–1.49)

## 2013-01-20 MED ORDER — WARFARIN SODIUM 2.5 MG PO TABS
2.5000 mg | ORAL_TABLET | Freq: Once | ORAL | Status: AC
  Administered 2013-01-20: 2.5 mg via ORAL
  Filled 2013-01-20: qty 1

## 2013-01-20 MED ORDER — SODIUM CHLORIDE 0.9 % IJ SOLN
10.0000 mL | INTRAMUSCULAR | Status: DC | PRN
  Administered 2013-01-21: 10 mL
  Administered 2013-01-21: 30 mL
  Administered 2013-01-21: 10 mL
  Administered 2013-01-22 (×3): 30 mL

## 2013-01-20 NOTE — Procedures (Signed)
Patient seen on Hemodialysis. QB 400 (RIJ TDC), UF goal 3L Treatment adjusted as needed.  Zetta Bills MD [facility] Office # [phone] Pager # [phone] 8:03 AM

## 2013-01-20 NOTE — Progress Notes (Signed)
[facility]  Subjective: Pt has returned from HD. Stated that HD was "rough" today and she is tired.   Objective: Vital signs in last 24 hours: Temp:  [97.5 F (36.4 C)-99.1 F (37.3 C)] 99.1 F (37.3 C) (07/08 1400) Pulse Rate:  [77-129] 88 (07/08 1400) Resp:  [16-18] 18 (07/08 1400) BP: (108-127)/(43-70) 108/48 mmHg (07/08 1400) SpO2:  [98 %-100 %] 99 % (07/08 1400) Weight:  [212 lb 15.4 oz (96.6 kg)-218 lb 4.1 oz (99 kg)] 212 lb 15.4 oz (96.6 kg) (07/08 1131) Last BM Date: 01/18/13  Intake/Output from previous day: 07/07 0701 - 07/08 0700 In: 240 [P.O.:240] Out: -  Intake/Output this shift: Total I/O In: -  Out: 2653 [Other:2653]  Medications Current Facility-Administered Medications  Medication Dose Route Frequency Provider Last Rate Last Dose  . 0.9 %  sodium chloride infusion   Intravenous Continuous Judie Petit, MD      . acetaminophen (TYLENOL) tablet 650 mg  650 mg Oral Q4H PRN Tully Mcinturff, PA-C      . allopurinol (ZYLOPRIM) tablet 150 mg  150 mg Oral Daily Jamison Yuhasz, PA-C   150 mg at 01/19/13 0912  . ALPRAZolam Prudy Feeler) tablet 0.25 mg  0.25 mg Oral BID PRN Robbie Lis, PA-C   0.25 mg at 01/19/13 2139  . antiseptic oral rinse (BIOTENE) solution 15 mL  15 mL Mouth Rinse BID Chrystie Nose, MD   15 mL at 01/19/13 1944  . camphor-menthol (SARNA) lotion   Topical PRN Zigmund Gottron, MD      . darbepoetin [facility]) injection 200 mcg  200 mcg Intravenous Q Thu-HD Trevor Iha, MD   200 mcg at 01/15/13 1446  . feeding supplement (RESOURCE BREEZE) liquid 1 Container  1 Container Oral BID BM Tonye Becket, RD   1 Container at 01/19/13 0913  . insulin aspart (novoLOG) injection 0-9 Units  0-9 Units Subcutaneous TID WC Chrystie Nose, MD   1 Units at 01/19/13 1156  . insulin aspart protamine- aspart (NOVOLOG 70/30) injection 10 Units  10 Units Subcutaneous Q breakfast Chrystie Nose, MD   10 Units at  01/19/13 0800  . insulin aspart protamine- aspart (NOVOLOG 70/30) injection 5 Units  5 Units Subcutaneous Q supper Chrystie Nose, MD   5 Units at 01/18/13 1735  . multivitamin (RENA-VIT) tablet 1 tablet  1 tablet Oral QHS Chrystie Nose, MD      . ondansetron [facility]) injection 4 mg  4 mg Intravenous Q6H PRN Alanis Clift, PA-C   4 mg at 01/14/13 0113  . oxyCODONE (Oxy IR/ROXICODONE) immediate release tablet 5-10 mg  5-10 mg Oral Q2H PRN Regina J Roczniak, PA-C      . pantoprazole (PROTONIX) EC tablet 40 mg  40 mg Oral Daily Malikai Gut, PA-C   40 mg at 01/19/13 0912  . phenylephrine (NEO-SYNEPHRINE) 40,000 mcg in dextrose 5 % 250 mL infusion  30-200 mcg/min Intravenous Titrated Chrystie Nose, MD   20 mcg/min at 01/17/13 0000  . phenytoin (DILANTIN) ER capsule 300 mg  300 mg Oral QHS Armie Moren, PA-C   300 mg at 01/19/13 2139  . senna-docusate (Senokot-S) tablet 1 tablet  1 tablet Oral QHS PRN Robbie Lis, PA-C   1 tablet at 01/05/13 0958  . simvastatin (ZOCOR) tablet 10 mg  10 mg Oral QHS Kori Colin, PA-C   10 mg at 01/19/13 2140  . sodium chloride 0.9 % injection 10 mL  10 mL  Intravenous Q12H Marykay Lex, MD   3 mL at 01/19/13 2144  . sodium chloride 0.9 % injection 10-40 mL  10-40 mL Intracatheter PRN Chrystie Nose, MD      . warfarin (COUMADIN) tablet 2.5 mg  2.5 mg Oral ONCE-1800 Gwenlyn Found Carney, RPH      . Warfarin - Pharmacist Dosing Inpatient   Does not apply q1800 Ann Held, [facility]      . zolpidem [facility]) tablet 5 mg  5 mg Oral QHS PRN Robbie Lis, PA-C   5 mg at 01/19/13 2139    PE: General appearance: alert, cooperative and no distress Lungs: clear to auscultation bilaterally Heart: regular rate and rhythm and 2/6 murmur Extremities: no LEE, the left hand is swollent, the left forearm is softer and less tender Pulses: 2+ and symmetric Skin: warm and dry Neurologic: Grossly normal  Lab Results:   Recent Labs   01/18/13 0400 01/19/13 0451 01/20/13 0500  WBC 19.0* 17.9* 14.0*  HGB 10.2* 10.4* 10.7*  HCT 31.0* 31.9* 33.0*  PLT 96* 123* 132*   BMET  Recent Labs  01/19/13 0451 01/20/13 0500 01/20/13 0746  NA 135 136 135  K 4.2 4.0 4.0  CL 100 99 99  CO2 27 25 25   GLUCOSE 121* 111* 114*  BUN 34* 42* 42*  CREATININE 2.43* 2.69* 2.69*  CALCIUM 8.4 8.6 8.4   PT/INR  Recent Labs  01/18/13 0400 01/19/13 0451 01/20/13 0500  LABPROT 23.9* 25.7* 24.9*  INR 2.22* 2.44* 2.34*     Assessment/Plan    Principal Problem:   Acute on chronic diastolic CHF (congestive heart failure), NYHA class 3, 09/25/12 Active Problems:   Sleep apnea on c-pap    Aortic insufficiency: mild   Chronic venous insufficiency with LE ulcers, will be followed at wound center   Diabetes mellitus type 2 in obese   Hypertrophic obstructive cardiomyopathy(425.11)   History of Rt brain CVA   PAF (paroxysmal atrial fibrillation),    Long term (current) use of anticoagulants   Chronic kidney disease (CKD), stage IV (severe), now with HD   Cardiorenal syndrome with renal failure   Cardiogenic shock   Hypotension   Acute blood loss anemia  Plan: BP continues to hold stable off pressor support. H/H continues to trend upward, slowly. H/H is 10.7/33. The left forearm is softer and less tender, however the left hand is noticeably more swollen today. No complaints of pain. Will continue to monitor. Continue with PT/OT. Social work is involved with finding SNF. ? Discharge later this week.     LOS: 21 days    Donn Wilmot M. Delmer Islam 01/20/2013 2:56 PM

## 2013-01-20 NOTE — Progress Notes (Signed)
Physical Therapy Treatment Patient Details Name: [name] MRN: [id number] DOB: 10/24/1933 Today's Date: 01/20/2013 Time: 5784-6962 PT Time Calculation (min): 24 min  PT Assessment / Plan / Recommendation  PT Comments   Pt demonstrates increased fatigue and weakness today. May be secondary to HD. Patient was able to tolerated there ex at EOB but did require some manual assist for some exercises. Pt did complain of neck pain and tightness; performed cervical stretches and shoulder retraction to ease pain and provide comfort. Will continue to work with patient to progress activity as tolerated.   Follow Up Recommendations  SNF     Does the patient have the potential to tolerate intense rehabilitation     Barriers to Discharge        Equipment Recommendations  Rolling walker with 5" wheels    Recommendations for Other Services    Frequency Min 3X/week   Progress towards PT Goals Progress towards PT goals: Progressing toward goals  Plan Current plan remains appropriate    Precautions / Restrictions Precautions Precautions: Fall   Pertinent Vitals/Pain No pain prior to session,  some minor neck discomfort    Mobility  Bed Mobility Bed Mobility: Supine to Sit;Sitting - Scoot to Edge of Bed;Sit to Supine Supine to Sit: 3: Mod assist Sitting - Scoot to Edge of Bed: 3: Mod assist Sit to Supine: 3: Mod assist Details for Bed Mobility Assistance: increased assist (may be secondary to HD) Transfers Transfers: Not assessed Ambulation/Gait Ambulation/Gait Assistance: Not tested (comment)    Exercises General Exercises - Lower Extremity Ankle Circles/Pumps: AROM;Both;10 reps Quad Sets: AROM;Both;10 reps Long Arc Quad: AROM;Both;10 reps (difficulty with RLE (knee extension limited)) Hip ABduction/ADduction: AROM;Both;10 reps;Supine Straight Leg Raises: AROM;AAROM;Both;10 reps;Supine (limited ROM ) Other Exercises Other Exercises: Manual Stretching for Cervical extension seated  EOB with shoulder retraction, 5 reps 5 second holds      PT Goals (current goals can now be found in the care plan section) Acute Rehab PT Goals Patient Stated Goal: To go to rehab to get stronger before I go home PT Goal Formulation: With patient Potential to Achieve Goals: Good  Visit Information  Last PT Received On: 01/20/13 Assistance Needed: +2 (for OOB mobility) History of Present Illness: 79 yo female with CHF with HOCM presenting with anasarca and ARF in the setting of decompensated HF.    Subjective Data  Subjective: Pt received supine in bed agreeable to PT Patient Stated Goal: To go to rehab to get stronger before I go home   Cognition  Cognition Arousal/Alertness: Awake/alert Behavior During Therapy: WFL for tasks assessed/performed Overall Cognitive Status: Within Functional Limits for tasks assessed    Balance  Static Sitting Balance Static Sitting - Balance Support: Feet supported Static Sitting - Level of Assistance: 5: Stand by assistance Static Sitting - Comment/# of Minutes: 14 minutes for ther-ex   End of Session PT - End of Session Equipment Utilized During Treatment: Gait belt;Oxygen (2Lo2 via [place]) Activity Tolerance: Patient limited by fatigue Patient left: in chair;with call bell/phone within reach;with family/visitor present Nurse Communication: Mobility status   GP     Fabio Asa 01/20/2013, 4:09 PM Charlotte Crumb, PT DPT  [phone]

## 2013-01-20 NOTE — Progress Notes (Signed)
Patient ID: [name], female   DOB: 04-06-1934, 79 y.o.   MRN: [id number]   [facility] Progress Note    Subjective:   Reports to be feeling better- refused CPAP last night again. Denies any CP/SOB   Objective:   BP 127/63  Pulse 80  Temp(Src) 97.6 F (36.4 C) (Oral)  Resp 21  Ht 5\' 5"  (1.651 m)  Wt 99 kg (218 lb 4.1 oz)  BMI 36.32 kg/m2  SpO2 100%  Intake/Output Summary (Last 24 hours) at 01/20/13 0758 Last data filed at 01/19/13 0900  Gross per 24 hour  Intake    240 ml  Output      0 ml  Net    240 ml   Weight change: -1.279 kg (-2 lb 13.1 oz)  Physical Exam: Gen: Comfortable on HD YQM:VHQIO RRR, normal S1 and S2  Resp:CTA bilaterally, no rales/no wheeze NGE:XBMW, obese, NT, BS normal Ext:LEs in wraps- 2+ edema over proximal leg/thigh  Imaging: No results found.  Labs: BMET  Recent Labs Lab 01/15/13 1139 01/17/13 1256 01/18/13 0400 01/19/13 0451 01/20/13 0500  NA 133* 132* 134* 135 136  K 4.4 3.9 4.3 4.2 4.0  CL 98 96 99 100 99  CO2 22 24 26 27 25   GLUCOSE 188* 104* 130* 121* 111*  BUN 45* 39* 22 34* 42*  CREATININE 2.44* 2.67* 1.78* 2.43* 2.69*  CALCIUM 8.1* 8.4 8.1* 8.4 8.6  PHOS 5.2* 4.1  --   --   --    CBC  Recent Labs Lab 01/17/13 0345 01/18/13 0400 01/19/13 0451 01/20/13 0500  WBC 17.5* 19.0* 17.9* 14.0*  HGB 10.2* 10.2* 10.4* 10.7*  HCT 30.1* 31.0* 31.9* 33.0*  MCV 92.3 95.7 97.3 98.8  PLT 131* 96* 123* 132*    Medications:    . allopurinol  150 mg Oral Daily  . antiseptic oral rinse  15 mL Mouth Rinse BID  . darbepoetin (ARANESP) injection - DIALYSIS  200 mcg Intravenous Q Thu-HD  . feeding supplement  1 Container Oral BID BM  . ferric gluconate (FERRLECIT/NULECIT) IV  125 mg Intravenous Q T,Th,Sa-HD  . insulin aspart  0-9 Units Subcutaneous TID WC  . insulin aspart protamine- aspart  10 Units Subcutaneous Q breakfast  . insulin aspart protamine- aspart  5 Units Subcutaneous Q supper  . multivitamin  1  tablet Oral QHS  . pantoprazole  40 mg Oral Daily  . phenytoin  300 mg Oral QHS  . simvastatin  10 mg Oral QHS  . sodium chloride  10 mL Intravenous Q12H  . Warfarin - Pharmacist Dosing Inpatient   Does not apply q1800     Assessment/ Plan:   1. Hypotension: with bleed into left arm post-AVF placement. Now improved hemodynamically post PRBCs. Overall blood pressures have improved but remains deconditioned- planned DC to SNF.  2. ESRD: on TTS dialysis schedule and set up for continued HD upon DC at [facility]. Ongoing HD today- will be able to DC to SNF when bed available to start OP HD.  3. Anemia:Hgb stable and improved s/p PRBCs- now on IV Fe and ESA.  4. CKD-MBD: PTH levels at goal for ESRD, will monitor phosphorus levels.  5. Nutrition: continue supplementation for now. Remains hypoalbuminemic.   Zetta Bills, MD 01/20/2013, 7:58 AM

## 2013-01-20 NOTE — Progress Notes (Signed)
Pt seen & examined late this PM after HD.  I agree with Ms. Sharol Harness' findings, exam & recommendations.  Had HD today - feels all tired out.  Doesn't feel like eating any of the hospital food.  For now keep off BB to allow BP room for HD volume removal.  H/H stable to improving -- given Iron in HD today.   SW/CM assisting with movement towards d/c.  Need PT/OT assistance. Also need nutrition supplementation since she is not really eating her meals well.  Marykay Lex, M.D., M.S. [facility] [address] [place], [place]  [address]  [phone] Pager # [phone] 01/20/2013 7:01 PM

## 2013-01-20 NOTE — Progress Notes (Signed)
Occupational Therapy Treatment Patient Details Name: [name] MRN: [id number] DOB: 06/11/1934 Today's Date: 01/20/2013 Time: 1210-1223 OT Time Calculation (min): 13 min  OT Assessment / Plan / Recommendation  OT comments  Pt making steady progress. Educated pt/family in theraband exercises for UB strengthening. Husband present for education.DIscussed D/C plans and husband is interested in pt going to  [facility]. Left SW message.  Follow Up Recommendations  SNF    Barriers to Discharge       Equipment Recommendations  3 in 1 bedside comode    Recommendations for Other Services    Frequency Min 2X/week   Progress towards OT Goals Progress towards OT goals: Progressing toward goals  Plan Discharge plan remains appropriate    Precautions / Restrictions Precautions Precautions: Fall   Pertinent Vitals/Pain no apparent distress     ADL  ADL Comments: Focus of session on educatin husband on theraband exercises. Also discussed njeed for pt to do as much as possible for herself, as in feeding herself, instead of husband feeding her.    OT Diagnosis:    OT Problem List:   OT Treatment Interventions:     OT Goals(current goals can now be found in the care plan section) Acute Rehab OT Goals Patient Stated Goal: To go to rehab to get stronger before I go home OT Goal Formulation: With patient/family Time For Goal Achievement: 01/23/13 Potential to Achieve Goals: Good ADL Goals Pt Will Perform Grooming: with supervision;standing Pt Will Perform Lower Body Bathing: with supervision;with adaptive equipment;sit to/from stand Pt Will Perform Lower Body Dressing: with supervision;with adaptive equipment;sit to/from stand Pt Will Transfer to Toilet: with supervision;ambulating;bedside commode Pt Will Perform Toileting - Clothing Manipulation and hygiene: with supervision;sit to/from stand Additional ADL Goal #1: Pt will transfer supine to sit EOB with supervision in preparation  for selfcare tasks. Additional ADL Goal #2: Pt will verbalize at least 3 energy conservation strategies for selfcare tasks with independence.  Visit Information  Last OT Received On: 01/20/13 Assistance Needed: +1 History of Present Illness: 79 yo female with CHF with HOCM presenting with anasarca and ARF in the setting of decompensated HF.    Subjective Data      Prior Functioning       Cognition  Cognition Arousal/Alertness: Awake/alert Behavior During Therapy: WFL for tasks assessed/performed Overall Cognitive Status: Within Functional Limits for tasks assessed    Mobility       Exercises  Other Exercises Other Exercises: BUE theraband exercises level 1 - instructed husband   Balance     End of Session OT - End of Session Activity Tolerance: Patient limited by fatigue Patient left: in bed;with call bell/phone within reach;with family/visitor present Nurse Communication: Mobility status  GO     Rannie Craney,HILLARY 01/20/2013, 12:27 PM [facility], OTR/L  [phone] 01/20/2013

## 2013-01-20 NOTE — Progress Notes (Signed)
ANTICOAGULATION CONSULT NOTE - Follow Up Consult  Pharmacy Consult for Coumadin Indication: Hx Afib/DVT  Allergies  Allergen Reactions  . Heparin     HIT panel pending    Patient Measurements: Height: 5\' 5"  (165.1 cm) Weight: 218 lb 4.1 oz (99 kg) IBW/kg (Calculated) : 57  Vital Signs: Temp: 97.6 F (36.4 C) (07/08 0711) Temp src: Oral (07/08 0711) BP: 124/58 mmHg (07/08 0830) Pulse Rate: 84 (07/08 0830)  Labs:  Recent Labs  01/18/13 0400 01/19/13 0451 01/20/13 0500 01/20/13 0746  HGB 10.2* 10.4* 10.7*  --   HCT 31.0* 31.9* 33.0*  --   PLT 96* 123* 132*  --   LABPROT 23.9* 25.7* 24.9*  --   INR 2.22* 2.44* 2.34*  --   CREATININE 1.78* 2.43* 2.69* 2.69*    Estimated Creatinine Clearance: 20.4 ml/min (by C-G formula based on Cr of 2.69).  Assessment: 79yof on coumadin pta for hx Afib/DVT. Vitamin K 2.5mg  po x 1 given 6/29 for AVF placement. AVF placed 6/30 and patient noted to have large LUE hematoma and small forearm hematoma on 7/3. Coumadin was held on 7/3 and 7/4 evening per VVS and resumed 7/5. INR this morning is therapeutic at 2.34. CBC stable, platelet count rising. The patient is moderate-high risk for HIT due to onset, % drop -  HIT panel pending. Hematoma appears to be stable/improved -- no new s/sx of bleeding noted at this time.   Goal of Therapy:  INR 2-3 Monitor platelets by anticoagulation protocol: Yes   Plan:  1) Coumadin 2.5 mg x 1 tonight (will start attempting to reinstate her home dose) 2) Follow up INR in AM 3) Follow up HIT panel  Tad Moore, BCPS  Clinical Pharmacist Pager [phone]  01/20/2013 9:16 AM

## 2013-01-21 LAB — CBC
MCHC: 32 g/dL (ref 30.0–36.0)
Platelets: 94 10*3/uL — ABNORMAL LOW (ref 150–400)
RDW: 22.7 % — ABNORMAL HIGH (ref 11.5–15.5)

## 2013-01-21 LAB — BASIC METABOLIC PANEL
BUN: 23 mg/dL (ref 6–23)
Calcium: 8.1 mg/dL — ABNORMAL LOW (ref 8.4–10.5)
Creatinine, Ser: 1.89 mg/dL — ABNORMAL HIGH (ref 0.50–1.10)
GFR calc Af Amer: 28 mL/min — ABNORMAL LOW (ref >90)

## 2013-01-21 LAB — PROTIME-INR
INR: 2.59 — ABNORMAL HIGH (ref 0.00–1.49)
Prothrombin Time: 26.9 seconds — ABNORMAL HIGH (ref 11.6–15.2)

## 2013-01-21 LAB — CULTURE, BLOOD (ROUTINE X 2)

## 2013-01-21 LAB — GLUCOSE, CAPILLARY
Glucose-Capillary: 108 mg/dL — ABNORMAL HIGH (ref 70–99)
Glucose-Capillary: 151 mg/dL — ABNORMAL HIGH (ref 70–99)

## 2013-01-21 MED ORDER — WARFARIN 1.25 MG HALF TABLET
1.2500 mg | ORAL_TABLET | Freq: Once | ORAL | Status: AC
  Administered 2013-01-21: 1.25 mg via ORAL
  Filled 2013-01-21: qty 1

## 2013-01-21 NOTE — Progress Notes (Signed)
ANTICOAGULATION CONSULT NOTE - Follow Up Consult  Pharmacy Consult for Coumadin Indication: atrial fibrillation  Allergies  Allergen Reactions  . Heparin     HIT panel pending    Patient Measurements: Height: 5\' 5"  (165.1 cm) Weight: 215 lb 12.2 oz (97.87 kg) IBW/kg (Calculated) : 57  Vital Signs: Temp: 98 F (36.7 C) (07/09 1359) Temp src: Oral (07/09 1359) BP: 128/61 mmHg (07/09 1359) Pulse Rate: 83 (07/09 1359)  Labs:  Recent Labs  01/19/13 0451 01/20/13 0500 01/20/13 0746 01/21/13 0500  HGB 10.4* 10.7*  --  10.9*  HCT 31.9* 33.0*  --  34.1*  PLT 123* 132*  --  94*  LABPROT 25.7* 24.9*  --  26.9*  INR 2.44* 2.34*  --  2.59*  CREATININE 2.43* 2.69* 2.69* 1.89*    Estimated Creatinine Clearance: 28.9 ml/min (by C-G formula based on Cr of 1.89).  Assessment:  79yof on coumadin pta for hx Afib/DVT. Vitamin K 2.5mg  po x 1 given 6/29 for AVF placement. AVF placed 6/30 and patient noted to have large LUE hematoma and small forearm hematoma on 7/3. Coumadin was held on 7/3 and 7/4 evening per VVS and resumed 7/5. INR is therapeutic today, but platelet count has dropped to 94K.  The patient is moderate-high risk for HIT due to onset, % drop - HIT panel pending. Last dose of heparin in HD was on 7/5. Hematoma noted improved -- no new s/sx of bleeding noted at this time.   Home Coumadin regimen: 2.5 mg daily except 1.25 mg on Mondays and Fridays.   Goal of Therapy:  INR 2-3, but will aim for 2-2.5 range for now Monitor platelets by anticoagulation protocol: Yes   Plan:   Will decrease today's Coumadin dose to 1.25 mg, to try to keep INR at lower end of therapeutic range.   Continue daily PT/INR.   Follow up HIT panel.  Dennie Fetters, [place] Pager: [phone] 01/21/2013,2:54 PM

## 2013-01-21 NOTE — Progress Notes (Signed)
Patient ID: [name], female   DOB: 1934-01-27, 79 y.o.   MRN: [id number]  [facility] Progress Note    Subjective:   Reports to be fatigued this morning-states that she has poor appetite due to the hospital food. Attempted to eat with help however had early satiety. She denies any chest pain or shortness of breath. PT notes/cardiology notes noted from overnight    Objective:   BP 119/58  Pulse 82  Temp(Src) 97.4 F (36.3 C) (Oral)  Resp 20  Ht 5\' 5"  (1.651 m)  Wt 97.87 kg (215 lb 12.2 oz)  BMI 35.91 kg/m2  SpO2 100%  Physical Exam: Gen: Appears restless laying in bed CVS: Pulse regular in rate and rhythm, heart sounds S1 and S2 normal Resp: Coarse breath sounds bilaterally-no distinct rales or rhonchi Abd: Soft, obese, nontender and bowel sounds are normal Ext: Lower extremity is in wraps-proximal 1-2+ edema  Labs: BMET  Recent Labs Lab 01/15/13 1139 01/17/13 1256 01/18/13 0400 01/19/13 0451 01/20/13 0500 01/20/13 0746 01/21/13 0500  NA 133* 132* 134* 135 136 135 137  K 4.4 3.9 4.3 4.2 4.0 4.0 3.8  CL 98 96 99 100 99 99 101  CO2 22 24 26 27 25 25 30   GLUCOSE 188* 104* 130* 121* 111* 114* 134*  BUN 45* 39* 22 34* 42* 42* 23  CREATININE 2.44* 2.67* 1.78* 2.43* 2.69* 2.69* 1.89*  CALCIUM 8.1* 8.4 8.1* 8.4 8.6 8.4 8.1*  PHOS 5.2* 4.1  --   --   --  3.7  --    CBC  Recent Labs Lab 01/18/13 0400 01/19/13 0451 01/20/13 0500 01/21/13 0500  WBC 19.0* 17.9* 14.0* 12.3*  HGB 10.2* 10.4* 10.7* 10.9*  HCT 31.0* 31.9* 33.0* 34.1*  MCV 95.7 97.3 98.8 98.8  PLT 96* 123* 132* 94*    Medications:    . allopurinol  150 mg Oral Daily  . antiseptic oral rinse  15 mL Mouth Rinse BID  . darbepoetin (ARANESP) injection - DIALYSIS  200 mcg Intravenous Q Thu-HD  . feeding supplement  1 Container Oral BID BM  . insulin aspart  0-9 Units Subcutaneous TID WC  . insulin aspart protamine- aspart  10 Units Subcutaneous Q breakfast  . insulin aspart protamine-  aspart  5 Units Subcutaneous Q supper  . multivitamin  1 tablet Oral QHS  . pantoprazole  40 mg Oral Daily  . phenytoin  300 mg Oral QHS  . simvastatin  10 mg Oral QHS  . sodium chloride  10 mL Intravenous Q12H  . Warfarin - Pharmacist Dosing Inpatient   Does not apply q1800     Assessment/ Plan:   1. Hypotension: Remains with fair blood pressures of antihypertensive therapy. No further evidence of ongoing bleeding. Ongoing physical therapy with the plan of placement to the skilled nursing facility for ongoing rehabilitation. 2. ESRD: on TTS dialysis schedule and set up for continued HD upon DC at [facility]. Plan for next hemodialysis tomorrow. 3. Anemia:Hgb stable and improved s/p PRBCs- now on IV Fe and ESA.  4. CKD-MBD: PTH levels at goal for ESRD, phosphorus levels at goal off binders.  5. Nutrition: continue supplementation for now. Remains hypoalbuminemic (likely malnutrition of uremia/CKD).    Zetta Bills, MD 01/21/2013, 8:11 AM

## 2013-01-21 NOTE — Progress Notes (Signed)
[facility]  Subjective: Somnolent. Pt states that she is very sleepy today. No pain.    Objective: Vital signs in last 24 hours: Temp:  [97.3 F (36.3 C)-99.1 F (37.3 C)] 97.4 F (36.3 C) (07/09 0456) Pulse Rate:  [82-129] 82 (07/09 0456) Resp:  [16-20] 20 (07/09 0456) BP: (108-125)/(48-66) 119/58 mmHg (07/09 0456) SpO2:  [99 %-100 %] 100 % (07/09 0456) Weight:  [212 lb 15.4 oz (96.6 kg)-215 lb 12.2 oz (97.87 kg)] 215 lb 12.2 oz (97.87 kg) (07/09 0456) Last BM Date: 01/21/13  Intake/Output from previous day: 07/08 0701 - 07/09 0700 In: 360 [P.O.:360] Out: 2653  Intake/Output this shift: Total I/O In: 120 [P.O.:120] Out: -   Medications Current Facility-Administered Medications  Medication Dose Route Frequency Provider Last Rate Last Dose  . 0.9 %  sodium chloride infusion   Intravenous Continuous Judie Petit, MD      . acetaminophen (TYLENOL) tablet 650 mg  650 mg Oral Q4H PRN Brittainy Simmons, PA-C      . allopurinol (ZYLOPRIM) tablet 150 mg  150 mg Oral Daily Brittainy Simmons, PA-C   150 mg at 01/21/13 1058  . ALPRAZolam Prudy Feeler) tablet 0.25 mg  0.25 mg Oral BID PRN Robbie Lis, PA-C   0.25 mg at 01/19/13 2139  . antiseptic oral rinse (BIOTENE) solution 15 mL  15 mL Mouth Rinse BID Chrystie Nose, MD   15 mL at 01/21/13 0851  . camphor-menthol (SARNA) lotion   Topical PRN Zigmund Gottron, MD      . darbepoetin [facility]) injection 200 mcg  200 mcg Intravenous Q Thu-HD Trevor Iha, MD   200 mcg at 01/15/13 1446  . feeding supplement (RESOURCE BREEZE) liquid 1 Container  1 Container Oral BID BM Tonye Becket, RD   1 Container at 01/21/13 1057  . insulin aspart (novoLOG) injection 0-9 Units  0-9 Units Subcutaneous TID WC Chrystie Nose, MD   1 Units at 01/21/13 [phone]  . insulin aspart protamine- aspart (NOVOLOG 70/30) injection 10 Units  10 Units Subcutaneous Q breakfast Chrystie Nose, MD   10 Units at 01/21/13  [phone]  . insulin aspart protamine- aspart (NOVOLOG 70/30) injection 5 Units  5 Units Subcutaneous Q supper Chrystie Nose, MD   5 Units at 01/18/13 1735  . multivitamin (RENA-VIT) tablet 1 tablet  1 tablet Oral QHS Chrystie Nose, MD   1 tablet at 01/20/13 2029  . ondansetron (ZOFRAN) injection 4 mg  4 mg Intravenous Q6H PRN Brittainy Simmons, PA-C   4 mg at 01/14/13 0113  . oxyCODONE (Oxy IR/ROXICODONE) immediate release tablet 5-10 mg  5-10 mg Oral Q2H PRN Regina J Roczniak, PA-C      . pantoprazole (PROTONIX) EC tablet 40 mg  40 mg Oral Daily Brittainy Simmons, PA-C   40 mg at 01/21/13 1058  . phenylephrine (NEO-SYNEPHRINE) 40,000 mcg in dextrose 5 % 250 mL infusion  30-200 mcg/min Intravenous Titrated Chrystie Nose, MD   20 mcg/min at 01/17/13 0000  . phenytoin (DILANTIN) ER capsule 300 mg  300 mg Oral QHS Brittainy Simmons, PA-C   300 mg at 01/20/13 2027  . senna-docusate (Senokot-S) tablet 1 tablet  1 tablet Oral QHS PRN Robbie Lis, PA-C   1 tablet at 01/21/13 0206  . simvastatin (ZOCOR) tablet 10 mg  10 mg Oral QHS Brittainy Simmons, PA-C   10 mg at 01/20/13 2031  . sodium chloride 0.9 % injection 10 mL  10 mL  Intravenous Q12H Marykay Lex, MD   10 mL at 01/21/13 1058  . sodium chloride 0.9 % injection 10-40 mL  10-40 mL Intracatheter PRN Chrystie Nose, MD   10 mL at 01/21/13 0512  . Warfarin - Pharmacist Dosing Inpatient   Does not apply q1800 Ann Held, [facility]      . zolpidem [facility]) tablet 5 mg  5 mg Oral QHS PRN Robbie Lis, PA-C   5 mg at 01/19/13 2139    PE: General appearance: alert, cooperative and no distress Lungs: clear to auscultation bilaterally Heart: regular rate and rhythm and 2/6 murmur Extremities: no bilateral pedal edema  Pulses: 2+ and symmetric Skin: warm and dry Neurologic: Grossly normal  Lab Results:   Recent Labs  01/19/13 0451 01/20/13 0500 01/21/13 0500  WBC 17.9* 14.0* 12.3*  HGB 10.4* 10.7* 10.9*  HCT 31.9* 33.0*  34.1*  PLT 123* 132* 94*   BMET  Recent Labs  01/20/13 0500 01/20/13 0746 01/21/13 0500  NA 136 135 137  K 4.0 4.0 3.8  CL 99 99 101  CO2 25 25 30   GLUCOSE 111* 114* 134*  BUN 42* 42* 23  CREATININE 2.69* 2.69* 1.89*  CALCIUM 8.6 8.4 8.1*   PT/INR  Recent Labs  01/19/13 0451 01/20/13 0500 01/21/13 0500  LABPROT 25.7* 24.9* 26.9*  INR 2.44* 2.34* 2.59*     Assessment/Plan    Principal Problem:   Acute on chronic diastolic CHF (congestive heart failure), NYHA class 3, 09/25/12 Active Problems:   Sleep apnea on c-pap    Aortic insufficiency: mild   Chronic venous insufficiency with LE ulcers, will be followed at wound center   Diabetes mellitus type 2 in obese   Hypertrophic obstructive cardiomyopathy(425.11)   History of Rt brain CVA   PAF (paroxysmal atrial fibrillation),    Long term (current) use of anticoagulants   Chronic kidney disease (CKD), stage IV (severe), now with HD   Cardiorenal syndrome with renal failure   Cardiogenic shock   Hypotension   Acute blood loss anemia  Plan: BP continues to hold stable off pressor support. BB is being held to allow BP room for HD volume removal. Most recent BP is 119/58. Continue with current HD schedule.  H/H continues to trend upward, slowly. H/H is 10.9/34. She has been receiving iron at HD. The left forearm continues to improve. No complaints of pain. Continue with daily PT/OT. Social work is involved with finding SNF. ? Discharge later this week.     LOS: 22 days    Brittainy M. Sharol Harness, PA-C 01/21/2013 11:10 AM   Patient seen and examined. Agree with assessment and plan. Breathing comfortably; somewhat lethargic. Stable hemodynamics. No bleeding. For dialysis tomorrow. SNF post dc for rehab.   Lennette Bihari, MD, [facility] 01/21/2013 11:49 AM

## 2013-01-21 NOTE — Progress Notes (Signed)
Spoke with husband at bedside. He indicates [facility] is interested and they are as well. Message left at [facility] to confirm bed. Anticipate d/c tomorrow. CSW will follow up.  Maree Krabbe, MSW, [facility] [phone]

## 2013-01-21 NOTE — Progress Notes (Signed)
Patient continues to refuse the use of nocturnal CPAP. She was asleep upon RT arrival to room. VSS at this time. Patient informed that CPAP is available if she decided to try.

## 2013-01-22 LAB — CBC
HCT: 34.3 % — ABNORMAL LOW (ref 36.0–46.0)
HCT: 34 % — ABNORMAL LOW (ref 36.0–46.0)
MCHC: 32.1 g/dL (ref 30.0–36.0)
MCV: 98.6 fL (ref 78.0–100.0)
Platelets: 121 10*3/uL — ABNORMAL LOW (ref 150–400)
RBC: 3.48 MIL/uL — ABNORMAL LOW (ref 3.87–5.11)
RDW: 22.5 % — ABNORMAL HIGH (ref 11.5–15.5)
WBC: 11.9 10*3/uL — ABNORMAL HIGH (ref 4.0–10.5)
WBC: 12.6 10*3/uL — ABNORMAL HIGH (ref 4.0–10.5)

## 2013-01-22 LAB — BASIC METABOLIC PANEL
CO2: 27 mEq/L (ref 19–32)
GFR calc non Af Amer: 21 mL/min — ABNORMAL LOW (ref >90)
Glucose, Bld: 118 mg/dL — ABNORMAL HIGH (ref 70–99)
Potassium: 3.8 mEq/L (ref 3.5–5.1)
Sodium: 138 mEq/L (ref 135–145)

## 2013-01-22 LAB — RENAL FUNCTION PANEL
Albumin: 2.2 g/dL — ABNORMAL LOW (ref 3.5–5.2)
BUN: 30 mg/dL — ABNORMAL HIGH (ref 6–23)
Chloride: 100 mEq/L (ref 96–112)
GFR calc Af Amer: 24 mL/min — ABNORMAL LOW (ref >90)
GFR calc non Af Amer: 21 mL/min — ABNORMAL LOW (ref >90)
Phosphorus: 2.8 mg/dL (ref 2.3–4.6)
Potassium: 4 mEq/L (ref 3.5–5.1)
Sodium: 137 mEq/L (ref 135–145)

## 2013-01-22 LAB — PROTIME-INR: INR: 2.87 — ABNORMAL HIGH (ref 0.00–1.49)

## 2013-01-22 LAB — GLUCOSE, CAPILLARY
Glucose-Capillary: 117 mg/dL — ABNORMAL HIGH (ref 70–99)
Glucose-Capillary: 84 mg/dL (ref 70–99)
Glucose-Capillary: 133 mg/dL — ABNORMAL HIGH (ref 70–99)

## 2013-01-22 MED ORDER — WARFARIN 0.5 MG HALF TABLET
0.5000 mg | ORAL_TABLET | Freq: Once | ORAL | Status: AC
  Administered 2013-01-22: 0.5 mg via ORAL
  Filled 2013-01-22: qty 1

## 2013-01-22 MED ORDER — ALTEPLASE 2 MG IJ SOLR
5.0000 mg | Freq: Once | INTRAMUSCULAR | Status: AC
  Administered 2013-01-22: 5 mg
  Filled 2013-01-22: qty 6

## 2013-01-22 MED ORDER — DARBEPOETIN ALFA-POLYSORBATE 200 MCG/0.4ML IJ SOLN
INTRAMUSCULAR | Status: AC
  Administered 2013-01-22: 200 ug via INTRAVENOUS
  Filled 2013-01-22: qty 0.4

## 2013-01-22 NOTE — Progress Notes (Signed)
Patient ID: [name], female   DOB: 1934-02-28, 79 y.o.   MRN: [id number]    [facility]   Subjective:   Report she had a restless night-confused as to when she had her last dialysis. Notes reviewed-she again refused CPAP.    Objective:   BP 111/58  Pulse 80  Temp(Src) 97.7 F (36.5 C) (Oral)  Resp 24  Ht 5\' 5"  (1.651 m)  Wt 98.3 kg (216 lb 11.4 oz)  BMI 36.06 kg/m2  SpO2 100%  Physical Exam: Gen: Comfortably resting in bed, breakfast tray at bedside CVS: Pulse regular in rate and rhythm, heart sounds S1 and S2 normal Resp: Coarse breath sounds bilaterally without any distinct rales or rhonchi Abd: Soft, obese, nontender and bowel sounds are normal Ext: Lower extremities in wraps-proximal edema appreciated  Labs: BMET  Recent Labs Lab 01/15/13 1139 01/17/13 1256 01/18/13 0400 01/19/13 0451 01/20/13 0500 01/20/13 0746 01/21/13 0500  NA 133* 132* 134* 135 136 135 137  K 4.4 3.9 4.3 4.2 4.0 4.0 3.8  CL 98 96 99 100 99 99 101  CO2 22 24 26 27 25 25 30   GLUCOSE 188* 104* 130* 121* 111* 114* 134*  BUN 45* 39* 22 34* 42* 42* 23  CREATININE 2.44* 2.67* 1.78* 2.43* 2.69* 2.69* 1.89*  CALCIUM 8.1* 8.4 8.1* 8.4 8.6 8.4 8.1*  PHOS 5.2* 4.1  --   --   --  3.7  --    CBC  Recent Labs Lab 01/19/13 0451 01/20/13 0500 01/21/13 0500 01/22/13 0530  WBC 17.9* 14.0* 12.3* 11.9*  HGB 10.4* 10.7* 10.9* 10.9*  HCT 31.9* 33.0* 34.1* 34.3*  MCV 97.3 98.8 98.8 98.6  PLT 123* 132* 94* 121*   Medications:    . allopurinol  150 mg Oral Daily  . antiseptic oral rinse  15 mL Mouth Rinse BID  . darbepoetin (ARANESP) injection - DIALYSIS  200 mcg Intravenous Q Thu-HD  . feeding supplement  1 Container Oral BID BM  . insulin aspart  0-9 Units Subcutaneous TID WC  . insulin aspart protamine- aspart  10 Units Subcutaneous Q breakfast  . insulin aspart protamine- aspart  5 Units Subcutaneous Q supper  . multivitamin  1 tablet Oral QHS  . pantoprazole   40 mg Oral Daily  . phenytoin  300 mg Oral QHS  . simvastatin  10 mg Oral QHS  . sodium chloride  10 mL Intravenous Q12H  . Warfarin - Pharmacist Dosing Inpatient   Does not apply q1800   Assessment/ Plan:   1. Hypotension: Remains with fair blood pressures of antihypertensive therapy. No further evidence of ongoing bleeding. Ongoing physical therapy with the plan of placement to a skilled nursing facility for ongoing rehabilitation (possibly discharged to [facility] today).  2. ESRD: on TTS dialysis schedule and set up for continued HD upon DC at [facility]. Scheduled hemodialysis today with likely discharge thereafter if she remains clinically stable.  3. Anemia:Hgb stable and improved s/p PRBCs- now on IV Fe and ESA.  4. CKD-MBD: PTH levels at goal for ESRD, phosphorus levels at goal off binders.  5. Nutrition: continue supplementation for now. Remains hypoalbuminemic (likely malnutrition of uremia/CKD).   Zetta Bills, MD 01/22/2013, 7:38 AM

## 2013-01-22 NOTE — Procedures (Signed)
Patient seen on Hemodialysis. QB 300, UF goal 2.8L Treatment adjusted as needed.  Zetta Bills MD [facility]. Office # [phone] Pager # [phone] 1:29 PM

## 2013-01-22 NOTE — Progress Notes (Signed)
ANTICOAGULATION CONSULT NOTE - Follow Up Consult  Pharmacy Consult for Coumadin Indication: hx Afib/DVT  Allergies  Allergen Reactions  . Heparin     HIT panel pending    Patient Measurements: Height: 5\' 5"  (165.1 cm) Weight: 216 lb 11.4 oz (98.3 kg) IBW/kg (Calculated) : 57  Vital Signs: Temp: 97.7 F (36.5 C) (07/10 0523) Temp src: Oral (07/10 0523) BP: 111/58 mmHg (07/10 0616) Pulse Rate: 80 (07/10 0523)  Labs:  Recent Labs  01/20/13 0500 01/20/13 0746 01/21/13 0500 01/22/13 0530  HGB 10.7*  --  10.9* 10.9*  HCT 33.0*  --  34.1* 34.3*  PLT 132*  --  94* 121*  LABPROT 24.9*  --  26.9* 29.1*  INR 2.34*  --  2.59* 2.87*  CREATININE 2.69* 2.69* 1.89* 2.18*    Estimated Creatinine Clearance: 25.1 ml/min (by C-G formula based on Cr of 2.18).   Assessment: 79yof on coumadin pta for hx Afib/DVT. Vitamin K 2.5mg  po x 1 given 6/29 for AVF placement. AVF placed 6/30 and patient noted to have large LUE hematoma and small forearm hematoma on 7/3. Coumadin was held on 7/3 and 7/4 evening per VVS and resumed 7/5. INR this morning is therapeutic at 2.87. CBC stable, platelet count improved. The patient is moderate-high risk for HIT due to onset, % drop - HIT panel pending. Hematoma appears to be stable/improved -- no new s/sx of bleeding noted at this time.   Goal of Therapy:  INR 2-3 Monitor platelets by anticoagulation protocol: Yes   Plan:  1) Coumadin 0.5mg  x 1 tonight (try to keep INR 2-2.5 instead of up towards 3) 2) Follow up INR, CBC, HIT panel  Fredrik Rigger 01/22/2013,8:50 AM

## 2013-01-22 NOTE — Progress Notes (Signed)
Pt does not wish to wear CPAP tonight. Pt encouraged to call RT if pt changes mind. No distress noted at this time. 

## 2013-01-22 NOTE — Progress Notes (Signed)
[facility]  Subjective: Fells ok. More alert today.   Objective: Vital signs in last 24 hours: Temp:  [97.7 F (36.5 C)-98 F (36.7 C)] 97.7 F (36.5 C) (07/10 0523) Pulse Rate:  [80-95] 80 (07/10 0523) Resp:  [18-24] 24 (07/10 0523) BP: (86-128)/(58-62) 111/58 mmHg (07/10 0616) SpO2:  [97 %-100 %] 100 % (07/10 0523) Weight:  [216 lb 11.4 oz (98.3 kg)] 216 lb 11.4 oz (98.3 kg) (07/10 0523) Last BM Date: 01/21/13  Intake/Output from previous day: 07/09 0701 - 07/10 0700 In: 480 [P.O.:480] Out: -  Intake/Output this shift: Total I/O In: 120 [P.O.:120] Out: -   Medications Current Facility-Administered Medications  Medication Dose Route Frequency Provider Last Rate Last Dose  . 0.9 %  sodium chloride infusion   Intravenous Continuous Judie Petit, MD      . acetaminophen (TYLENOL) tablet 650 mg  650 mg Oral Q4H PRN Brittainy Simmons, PA-C      . allopurinol (ZYLOPRIM) tablet 150 mg  150 mg Oral Daily Brittainy Simmons, PA-C   150 mg at 01/21/13 1058  . ALPRAZolam Prudy Feeler) tablet 0.25 mg  0.25 mg Oral BID PRN Robbie Lis, PA-C   0.25 mg at 01/19/13 2139  . antiseptic oral rinse (BIOTENE) solution 15 mL  15 mL Mouth Rinse BID Chrystie Nose, MD   15 mL at 01/21/13 2017  . camphor-menthol (SARNA) lotion   Topical PRN Zigmund Gottron, MD      . darbepoetin [facility]) injection 200 mcg  200 mcg Intravenous Q Thu-HD Trevor Iha, MD   200 mcg at 01/15/13 1446  . feeding supplement (RESOURCE BREEZE) liquid 1 Container  1 Container Oral BID BM Tonye Becket, RD   1 Container at 01/21/13 1057  . insulin aspart (novoLOG) injection 0-9 Units  0-9 Units Subcutaneous TID WC Chrystie Nose, MD   1 Units at 01/21/13 [phone]  . insulin aspart protamine- aspart (NOVOLOG 70/30) injection 10 Units  10 Units Subcutaneous Q breakfast Chrystie Nose, MD   10 Units at 01/22/13 0754  . insulin aspart protamine- aspart (NOVOLOG 70/30) injection 5  Units  5 Units Subcutaneous Q supper Chrystie Nose, MD   5 Units at 01/18/13 1735  . multivitamin (RENA-VIT) tablet 1 tablet  1 tablet Oral QHS Chrystie Nose, MD   1 tablet at 01/21/13 2217  . ondansetron (ZOFRAN) injection 4 mg  4 mg Intravenous Q6H PRN Brittainy Simmons, PA-C   4 mg at 01/14/13 0113  . oxyCODONE (Oxy IR/ROXICODONE) immediate release tablet 5-10 mg  5-10 mg Oral Q2H PRN Regina J Roczniak, PA-C      . pantoprazole (PROTONIX) EC tablet 40 mg  40 mg Oral Daily Brittainy Simmons, PA-C   40 mg at 01/21/13 1058  . phenytoin (DILANTIN) ER capsule 300 mg  300 mg Oral QHS Brittainy Simmons, PA-C   300 mg at 01/21/13 2217  . senna-docusate (Senokot-S) tablet 1 tablet  1 tablet Oral QHS PRN Robbie Lis, PA-C   1 tablet at 01/21/13 0206  . simvastatin (ZOCOR) tablet 10 mg  10 mg Oral QHS Brittainy Simmons, PA-C   10 mg at 01/21/13 2218  . sodium chloride 0.9 % injection 10 mL  10 mL Intravenous Q12H Marykay Lex, MD   10 mL at 01/21/13 1058  . sodium chloride 0.9 % injection 10-40 mL  10-40 mL Intracatheter PRN Chrystie Nose, MD   30 mL at 01/22/13 0537  . Warfarin -  Pharmacist Dosing Inpatient   Does not apply q1800 Ann Held, [facility]      . zolpidem [facility]) tablet 5 mg  5 mg Oral QHS PRN Robbie Lis, PA-C   5 mg at 01/19/13 2139    PE: General appearance: alert, cooperative and no distress Lungs: clear to auscultation bilaterally Heart: regular rate and rhythm and 2/6 murmur Extremities: bilateral pedal edema Pulses: 2+ and symmetric Skin: warm and dry Neurologic: Grossly normal  Lab Results:   Recent Labs  01/20/13 0500 01/21/13 0500 01/22/13 0530  WBC 14.0* 12.3* 11.9*  HGB 10.7* 10.9* 10.9*  HCT 33.0* 34.1* 34.3*  PLT 132* 94* 121*   BMET  Recent Labs  01/20/13 0746 01/21/13 0500 01/22/13 0530  NA 135 137 138  K 4.0 3.8 3.8  CL 99 101 99  CO2 25 30 27   GLUCOSE 114* 134* 118*  BUN 42* 23 29*  CREATININE 2.69* 1.89* 2.18*   CALCIUM 8.4 8.1* 8.4   PT/INR  Recent Labs  01/20/13 0500 01/21/13 0500 01/22/13 0530  LABPROT 24.9* 26.9* 29.1*  INR 2.34* 2.59* 2.87*    Assessment/Plan  Principal Problem:   Acute on chronic diastolic CHF (congestive heart failure), NYHA class 3, 09/25/12 Active Problems:   Sleep apnea on c-pap    Aortic insufficiency: mild   Chronic venous insufficiency with LE ulcers, will be followed at wound center   Diabetes mellitus type 2 in obese   Hypertrophic obstructive cardiomyopathy(425.11)   History of Rt brain CVA   PAF (paroxysmal atrial fibrillation),    Long term (current) use of anticoagulants   Chronic kidney disease (CKD), stage IV (severe), now with HD   Cardiorenal syndrome with renal failure   Cardiogenic shock   Hypotension   Acute blood loss anemia  Plan: Both H/H and BP holding steady. Plan for HD today. Nephrology is treating anemia with Fe and ESA. The patient and her husband have decided on [facility] for SNF. Social Worker is making arrangements. Bed pending. Can likely discharge once placement is secured. Nephrology will arrange TTS OP HD.     LOS: 23 days    Brittainy M. Sharol Harness, PA-C 01/22/2013 8:44 AM  Continues to look better & H/H, BP/HR stable.  Seems to be tolerating HD.    At this point I do think that she should be close to d/c.   Sugars well controlled. Arm stable.    Will defer to Dr. Rennis Golden re: final decision re +/- BB or Amiodarone.  Marykay Lex, M.D., M.S. [facility] [address] [place], [place]  [address]  [phone] Pager # [phone] 01/22/2013 5:18 PM

## 2013-01-22 NOTE — Progress Notes (Signed)
SNF bed secured for patient at [facility]- they can accept her and have rec'd Insurance auth. Awaiting MD for d/c order and d/c summary-possibly for d/c tomorrow- will f/u tomorrow for further planning-  Reece Levy, MSW [phone]

## 2013-01-23 ENCOUNTER — Encounter (HOSPITAL_COMMUNITY): Payer: Self-pay | Admitting: Cardiology

## 2013-01-23 DIAGNOSIS — T148XXA Other injury of unspecified body region, initial encounter: Secondary | ICD-10-CM

## 2013-01-23 HISTORY — DX: Other injury of unspecified body region, initial encounter: T14.8XXA

## 2013-01-23 LAB — GLUCOSE, CAPILLARY
Glucose-Capillary: 120 mg/dL — ABNORMAL HIGH (ref 70–99)
Glucose-Capillary: 114 mg/dL — ABNORMAL HIGH (ref 70–99)

## 2013-01-23 LAB — BASIC METABOLIC PANEL
CO2: 28 mEq/L (ref 19–32)
Calcium: 8 mg/dL — ABNORMAL LOW (ref 8.4–10.5)
Creatinine, Ser: 1.56 mg/dL — ABNORMAL HIGH (ref 0.50–1.10)
GFR calc non Af Amer: 31 mL/min — ABNORMAL LOW (ref >90)
Sodium: 137 mEq/L (ref 135–145)

## 2013-01-23 LAB — CBC
HCT: 34.5 % — ABNORMAL LOW (ref 36.0–46.0)
Hemoglobin: 11.1 g/dL — ABNORMAL LOW (ref 12.0–15.0)
MCH: 31.7 pg (ref 26.0–34.0)
MCHC: 31.6 g/dL (ref 30.0–36.0)
Platelets: 77 10*3/uL — ABNORMAL LOW (ref 150–400)
RBC: 3.5 MIL/uL — ABNORMAL LOW (ref 3.87–5.11)
RDW: 23.1 % — ABNORMAL HIGH (ref 11.5–15.5)
WBC: 11 10*3/uL — ABNORMAL HIGH (ref 4.0–10.5)
WBC: 11.3 10*3/uL — ABNORMAL HIGH (ref 4.0–10.5)

## 2013-01-23 LAB — PROTIME-INR
INR: 2.68 — ABNORMAL HIGH (ref 0.00–1.49)
Prothrombin Time: 27.6 seconds — ABNORMAL HIGH (ref 11.6–15.2)

## 2013-01-23 MED ORDER — DARBEPOETIN ALFA-POLYSORBATE 200 MCG/0.4ML IJ SOLN: 200.0000 ug | INTRAMUSCULAR | Status: AC

## 2013-01-23 MED ORDER — BOOST / RESOURCE BREEZE PO LIQD: 1.0000 | Freq: Two times a day (BID) | ORAL | Status: AC

## 2013-01-23 MED ORDER — INSULIN ASPART PROT & ASPART (70-30 MIX) 100 UNIT/ML ~~LOC~~ SUSP: 10.0000 [IU] | Freq: Every day | SUBCUTANEOUS | Status: DC

## 2013-01-23 MED ORDER — ALTEPLASE 2 MG IJ SOLR
5.0000 mg | Freq: Once | INTRAMUSCULAR | Status: AC
  Administered 2013-01-23: 5 mg
  Filled 2013-01-23 (×2): qty 6

## 2013-01-23 MED ORDER — LOPERAMIDE HCL 2 MG PO CAPS: 2.0000 mg | ORAL_CAPSULE | ORAL | Status: AC | PRN

## 2013-01-23 MED ORDER — ZOLPIDEM TARTRATE 5 MG PO TABS: 5.0000 mg | ORAL_TABLET | Freq: Every evening | ORAL | Status: DC | PRN

## 2013-01-23 MED ORDER — INSULIN ASPART PROT & ASPART (70-30 MIX) 100 UNIT/ML ~~LOC~~ SUSP: 5.0000 [IU] | Freq: Every day | SUBCUTANEOUS | Status: DC

## 2013-01-23 MED ORDER — OXYCODONE HCL 5 MG PO TABS: 5.0000 mg | ORAL_TABLET | ORAL | Status: DC | PRN

## 2013-01-23 MED ORDER — ALPRAZOLAM 0.25 MG PO TABS: 0.2500 mg | ORAL_TABLET | Freq: Two times a day (BID) | ORAL | Status: AC | PRN

## 2013-01-23 MED ORDER — WARFARIN 0.5 MG HALF TABLET
0.5000 mg | ORAL_TABLET | Freq: Once | ORAL | Status: AC
  Administered 2013-01-23: 0.5 mg via ORAL
  Filled 2013-01-23: qty 1

## 2013-01-23 MED ORDER — WARFARIN SODIUM 2.5 MG PO TABS: 1.2500 mg | ORAL_TABLET | Freq: Every evening | ORAL | Status: DC

## 2013-01-23 MED ORDER — INSULIN ASPART 100 UNIT/ML ~~LOC~~ SOLN: 0.0000 [IU] | Freq: Three times a day (TID) | SUBCUTANEOUS | Status: DC

## 2013-01-23 MED ORDER — LOPERAMIDE HCL 2 MG PO CAPS
2.0000 mg | ORAL_CAPSULE | ORAL | Status: DC | PRN
  Administered 2013-01-23: 2 mg via ORAL
  Filled 2013-01-23: qty 1

## 2013-01-23 MED ORDER — CAMPHOR-MENTHOL 0.5-0.5 % EX LOTN: TOPICAL_LOTION | CUTANEOUS | Status: DC | PRN

## 2013-01-23 MED ORDER — RENA-VITE PO TABS: 1.0000 | ORAL_TABLET | Freq: Every day | ORAL | Status: DC

## 2013-01-23 MED ORDER — ACETAMINOPHEN 325 MG PO TABS: 650.0000 mg | ORAL_TABLET | ORAL | Status: DC | PRN

## 2013-01-23 MED ORDER — WARFARIN SODIUM 1 MG PO TABS: 0.5000 mg | ORAL_TABLET | ORAL | Status: DC

## 2013-01-23 MED ORDER — POTASSIUM CHLORIDE CRYS ER 20 MEQ PO TBCR
20.0000 meq | EXTENDED_RELEASE_TABLET | Freq: Once | ORAL | Status: AC
  Administered 2013-01-23: 20 meq via ORAL
  Filled 2013-01-23: qty 1

## 2013-01-23 NOTE — Discharge Summary (Signed)
Physician Discharge Summary      Patient ID: [name] MRN: [id number] DOB/AGE: [birth year] 77 y.o.  Admit date: 12/30/2012 Discharge date: 01/23/2013  Discharge Diagnoses:  Principal Problem:   Acute on chronic diastolic CHF (congestive heart failure), NYHA class 3, 09/25/12 Active Problems:   Sleep apnea on c-pap    Morbid obesity   Aortic insufficiency: mild   Chronic venous insufficiency with LE ulcers, will be followed at wound center, currently with wraps   Diabetes mellitus type 2 in obese   Diastolic dysfunction stage 2 EF 60-65%, with outflow track obstruction Jan 2014   Hypertrophic obstructive cardiomyopathy(425.11)   History of Rt brain CVA   PAF (paroxysmal atrial fibrillation),    Long term (current) use of anticoagulants   Hyperlipidemia   Lower extremity edema, improved with dialysis   Seizure disorder on dilatin   Chronic kidney disease (CKD), stage IV (severe), now with HD   Cardiorenal syndrome with renal failure   Cardiogenic shock-resolved   Hypotension   Acute blood loss anemia   Hematoma, lt upper arm   Discharged Condition: good  Procedures: 01/12/13  PROCEDURE: #1 left upper arm AV fistula creation  #2 right IJ hemodialysis catheter  By Dr. Tawanna Cooler Early  Dialysis:  Last dialysis 01/23/13  Needs INR 01/26/13 GOAL 2-2.5 Needs PT and OT Dialysis 01/27/13 at [facility], Tuesday at 10:45 am--will need CBC with dialysis     Hospital Course: 79 y/o AAF, followed at [facility] by Dr. Rennis Golden. Her history is significant for chronic diastolic dysfunction, DM, HTN, obesity and OSA. She was recently admitted in March of this year for A/C CHF. During that admission, she was treated with IV Lasix. She was diuresed to a dry weight of approximately 233 lbs. She was last seen in follow-up at [facility] on 11/25/12, by Corine Shelter, PA-C. At that visit she was compensated and stable. She was instructed to continue with her dose of home Lasix, 40 mg BID, and Zaroxolyn PRN.  She presented to the [facility] ER today with complaints of progressively worsening SOB and bilateral LEE. She first noticed dyspnea with mild exertion x 7 days, but noted resting dyspnea 12/29/12. She has associated orthopnea and sleeps with 3 pillows. No PND or chest pain. She endorses a 20 lb weight gain over the past week. Other associated symptoms include a persistent, dry cough x 7 days. She reports compliance with her Lasix and Zaroxolyn. She states that she did eat sausage for breakfast a few days ago, but otherwise reports being compliant with a low sodium diet.  The patient also stated that she was admitted to [facility] from 5/23- 5/25 due to a seizure. CT and MRI were both negative. This was felt to be secondary to a prior right cortical stroke in January of this year. She denies further occurrence.   On admit she was borderline hypotensive, Cr. Was further elevated than baseline and she was on bactrim for LE cellulitis.  BNP > 20K Troponin elvated due to CHF. She was admitted to step down unit. By the next morning patient was found to be critically ill with multiorgan system failure. She was placed on CV VHD, catheter was placed.  Her INR was reversed with vitamin K. Clonidine was stopped. Patient had been on Phenylephrine for blood pressure support.. she also had diffuse anasarca.  With CV VH she slowly improved with her severe biventricular failure.  Wound care nurse consult as for her lower extremity wounds silver dressing was applied  of both areas and covered with foam dressing and wrapped with Kerlix.  The hope is to followup with wound care center at discharge the dressings were changed weekly.  Patient slowly but steadily improved with volume removal. 01/06/2013 she was no longer in shock was felt she could tolerate conventional hemodialysis.  After holding coumadin pt underwent hemodialysis catheter and AV fistula.  1-2 days postop she developed a hematoma at the area Coumadin was held and site  stabilized. Ultrasound of the arm only revealed hematoma.  Due to hypotension she did go back on Neo-Synephrine blood pressure stabilized as well.  She was transfused, blood pressure improved and she was weaned off the neosyn.  With steady improvement she was ready for discharge to [facility] for physical therapy and occupational therapy. She will begin dialysis on Tuesday Thursdays and Saturdays at [facility]. Her last dialysis was 01/23/2013 [facility].  Platelets were low prior to discharge she had HIT panel- results not yet back smear was done, see below. she will need a CBC on Tuesday with dialysis.  Consults: pulmonary/intensive care, nephrology and vascular surgery  Significant Diagnostic Studies:  BMET    Component Value Date/Time   NA 137 01/23/2013 0533   K 3.4* 01/23/2013 0533   CL 101 01/23/2013 0533   CO2 28 01/23/2013 0533   GLUCOSE 116* 01/23/2013 0533   BUN 15 01/23/2013 0533   CREATININE 1.56* 01/23/2013 0533   CREATININE 1.87* 11/26/2012 1200   CALCIUM 8.0* 01/23/2013 0533   GFRNONAA 31* 01/23/2013 0533   GFRAA 36* 01/23/2013 0533    CBC    Component Value Date/Time   WBC 11.3* 01/23/2013 1205   RBC 3.50* 01/23/2013 1205   HGB 11.1* 01/23/2013 1205   HCT 35.1* 01/23/2013 1205   PLT 88* 01/23/2013 1205   MCV 100.3* 01/23/2013 1205   MCH 31.7 01/23/2013 1205   MCHC 31.6 01/23/2013 1205   RDW 23.1* 01/23/2013 1205   LYMPHSABS 1.3 12/30/2012 1652   MONOABS 0.7 12/30/2012 1652   EOSABS 0.2 12/30/2012 1652   BASOSABS 0.0 12/30/2012 1652   Collected at 01/23/13 1205 Smear    LARGE PLATELETS PRESENT    Result Comment:  POLYCHROMASIA PRESENT ATYPICAL LYMPHOCYTES TEARDROP CELLS TOXIC GRANULATION     Resulting Lab:  [facility]        INR at discharge 2.68--she'll take 0.5 mg alternating with 1 mg every other day.   Blood cultures were negative  Iron 67, U. RBC 176 TIBC 243  Cardiac markers troponin 0.3 to followup serum troponins were all<0.30  2D Echo  12/31/12 ------------------------------------------------------------ Study Conclusions  - Left ventricle: The cavity size is small. There was severe concentric hypertrophy, with slightlygreater basal septal thickness up to 1.7cm. Hyperdynamic ventricle with mid-systolic and LVOT dynamic gradients. The estimated ejection fraction was 80%. There was dynamic obstruction in the outflow tract, with mid-cavity obliteration, a peak velocity of 408cm/sec, and a peak gradient of55mm Hg. Doppler parameters are consistent with restrictive left ventricular relaxation (grade3 diastolic dysfunction). The E/e' ratio is >20, suggesting high LV filling pressure. - Aortic valve: Mildly calcified leaflets. Transvalvular velocity was elevated - the aortic outflow jet was biphid, indicating complete dynamic LVOT obstruction from SAM. No regurgitation. - Mitral valve: Mildly thickened leaflets . Systolic anterior motion of the anterior leaflet. Trivial regurgitation. - Left atrium: The atrium was normal in size. - Right ventricle: The cavity size was mildly dilated. - Right atrium: Massively dilated (28.1 cm2). - Atrial septum: No defect or  patent foramen ovale was identified. - Tricuspid valve: Moderate regurgitation. - Pulmonary arteries: PA peak pressure: 55mm Hg (S). - Inferior vena cava: The vessel was dilated; the respirophasic diameter changes were blunted (< 50%); findings are consistent with elevated central venous pressure.    PORTABLE CHEST - 1 VIEW  Comparison: Chest x-ray 01/14/2013.  Findings: There is a left-sided internal jugular central venous catheter with tip terminating in the mid to distal superior vena cava. Right internal jugular PermCath remains in position with the tips terminating in the distal superior vena cava and superior aspect of the right atrium. Lung volumes are normal. No acute consolidative airspace disease. No pneumothorax. No pleural effusions. Mild  pulmonary venous congestion, without frank pulmonary edema. Mild cardiomegaly. The patient is rotated to the right on today's exam, resulting in distortion of the mediastinal contours and reduced diagnostic sensitivity and specificity for mediastinal pathology. Atherosclerosis in the thoracic aorta.  IMPRESSION: 1. Support apparatus, as above. 2. No pneumothorax following placement of left-sided internal jugular catheter. 3. Mild cardiomegaly with pulmonary venous congestion, but no frank pulmonary edema.       Discharge Exam: Blood pressure 103/68, pulse 87, temperature 97.7 F (36.5 C), temperature source Oral, resp. rate 19, height 5\' 5"  (1.651 m), weight 208 lb 1.8 oz (94.4 kg), SpO2 100.00%.   AM of discharge:  PE: General:Pleasant affect, NAD  Skin:Warm and dry, brisk capillary refill  HEENT:normocephalic, sclera clear, mucus membranes moist  Neck:supple, no JVD, no bruits  Heart:S1S2 RRR with 2/6 systolic murmur, no gallup, rub or click  Lungs:clear without rales, rhonchi, or wheezes  JYN:WGNF, non tender, + BS, do not palpate liver spleen or masses  Ext:no lower ext edema, legs wrapped for ulcers.  Neuro:alert and oriented, MAE, follows commands, + facial symmetry     Disposition: 01-Home or Self Care       Future Appointments Provider Department Dept Phone   01/26/2013 12:00 PM Nada Boozer, NP [facility] [place] [phone]   01/28/2013 3:20 PM Abelino Derrick, PA-C [facility] [place] [phone]   02/17/2013 9:30 AM [facility] - [phone]   02/17/2013 10:45 AM Larina Earthly, MD [facility] -[place] [phone]       Medication List    STOP taking these medications       cloNIDine 0.2 MG tablet  Commonly known as:  CATAPRES     furosemide 40 MG tablet  Commonly known as:  LASIX     glimepiride 4 MG tablet  Commonly known as:   AMARYL     insulin lispro protamine-lispro (75-25) 100 UNIT/ML Susp  Commonly known as:  HUMALOG 75/25     isosorbide-hydrALAZINE 20-37.5 MG per tablet  Commonly known as:  BIDIL     metolazone 2.5 MG tablet  Commonly known as:  ZAROXOLYN     metoprolol succinate 25 MG 24 hr tablet  Commonly known as:  TOPROL-XL     potassium chloride SA 20 MEQ tablet  Commonly known as:  K-DUR,KLOR-CON     sulfamethoxazole-trimethoprim 800-160 MG per tablet  Commonly known as:  BACTRIM DS      TAKE these medications       acetaminophen 325 MG tablet  Commonly known as:  TYLENOL  Take 2 tablets (650 mg total) by mouth every 4 (four) hours as needed.     allopurinol 300 MG tablet  Commonly known as:  ZYLOPRIM  Take 150 mg  by mouth daily.     ALPRAZolam 0.25 MG tablet  Commonly known as:  XANAX  Take 1 tablet (0.25 mg total) by mouth 2 (two) times daily as needed for anxiety.     camphor-menthol lotion  Commonly known as:  SARNA  Apply topically as needed for itching.     darbepoetin 200 MCG/0.4ML Soln  Commonly known as:  ARANESP  Inject 0.4 mLs (200 mcg total) into the vein every Thursday with hemodialysis.     feeding supplement Liqd  Take 1 Container by mouth 2 (two) times daily between meals.     insulin aspart 100 UNIT/ML injection  Commonly known as:  novoLOG  Inject 0-9 Units into the skin 3 (three) times daily with meals.     insulin aspart protamine- aspart (70-30) 100 UNIT/ML injection  Commonly known as:  NOVOLOG MIX 70/30  Inject 0.05 mLs (5 Units total) into the skin daily with supper.     insulin aspart protamine- aspart (70-30) 100 UNIT/ML injection  Commonly known as:  NOVOLOG MIX 70/30  Inject 0.1 mLs (10 Units total) into the skin daily with breakfast.     loperamide 2 MG capsule  Commonly known as:  IMODIUM  Take 1 capsule (2 mg total) by mouth as needed for diarrhea or loose stools.     multivitamin Tabs tablet  Take 1 tablet by mouth at bedtime.      omeprazole 20 MG capsule  Commonly known as:  PRILOSEC  Take 20 mg by mouth every morning.     oxyCODONE 5 MG immediate release tablet  Commonly known as:  Oxy IR/ROXICODONE  Take 1-2 tablets (5-10 mg total) by mouth every 2 (two) hours as needed.     phenytoin 300 MG ER capsule  Commonly known as:  DILANTIN  Take 1 capsule (300 mg total) by mouth at bedtime.     senna-docusate 8.6-50 MG per tablet  Commonly known as:  Senokot-S  Take 1 tablet by mouth at bedtime as needed for constipation.     simvastatin 10 MG tablet  Commonly known as:  ZOCOR  Take 10 mg by mouth at bedtime.     warfarin 1 MG tablet  Commonly known as:  COUMADIN  Take 0.5-1 tablets (0.5-1 mg total) by mouth as directed.     zolpidem 5 MG tablet  Commonly known as:  AMBIEN  Take 1 tablet (5 mg total) by mouth at bedtime as needed for sleep.       Follow-up Information   Follow up with EARLY, TODD, MD In 4 weeks. (office will arrange-sent)    Contact information:   [address] [place] [address] [phone]       Follow up with Abelino Derrick, PA-C On 01/28/2013. (at 3:20 pm)    Contact information:   [address] [place] [address] [phone]      Discharge instructions: Diabetic and Renal diet  Physical therapy and occupational therapy  Dialysis on Tuesday the 15th at [facility] at 10:45 AM  Monitor Lt arm with hematoma  Will need CBC with dialysis on Tuesday.  Protime Saturday if possible, if not then on Monday.  INR Goal 2-2.5  The appointment for Monday is not an appointment it is for [place] to call the pt.    Signed: Leone Brand Nurse Practitioner-Certified [facility] 01/23/2013, 5:10 PM  Time spent on discharge :60 minutes.

## 2013-01-23 NOTE — Progress Notes (Signed)
Clinical Social Work Department CLINICAL SOCIAL WORK PLACEMENT NOTE 01/23/2013  Patient:  [name], [name]  Account Number:  [id number] Admit date:  12/30/2012  Clinical Social Worker:  Leron Croak, CLINICAL SOCIAL WORKER  Date/time:  01/09/2013 02:28 PM  Clinical Social Work is seeking post-discharge placement for this patient at the following level of care:   SKILLED NURSING   (*CSW will update this form in Epic as items are completed)   01/09/2013  Patient/family provided with [facility] Department of Clinical Social Work's list of facilities offering this level of care within the geographic area requested by the patient (or if unable, by the patient's family).  01/09/2013  Patient/family informed of their freedom to choose among providers that offer the needed level of care, that participate in Medicare, Medicaid or managed care program needed by the patient, have an available bed and are willing to accept the patient.  01/09/2013  Patient/family informed of [facility]' ownership interest in [facility], as well as of the fact that they are under no obligation to receive care at this facility.  PASARR submitted to EDS on 01/09/2013 PASARR number received from EDS on 01/09/2013  FL2 transmitted to all facilities in geographic area requested by pt/family on  01/09/2013 FL2 transmitted to all facilities within larger geographic area on 01/09/2013  Patient informed that his/her managed care company has contracts with or will negotiate with  certain facilities, including the following:     Patient/family informed of bed offers received:  01/22/2013 Patient chooses bed at [facility] Physician recommends and patient chooses bed at    Patient to be transferred to [facility] on  01/23/2013 Patient to be transferred to facility by EMS  The following physician request were entered in Epic:   Additional Comments: Reece Levy, MSW [phone]

## 2013-01-23 NOTE — Progress Notes (Signed)
01/23/2013 3:04 PM Hemodialysis Outpatient Note; this patient will be expected at the [facility] center on Tuesday July 15 at 1045 AM. Thank you. Tilman Neat

## 2013-01-23 NOTE — Progress Notes (Signed)
OT Cancellation Note  Patient Details Name: [name] MRN: [id number] DOB: May 03, 1934   Cancelled Treatment:    Reason Eval/Treat Not Completed: Patient at procedure or test/ unavailable  [facility] Jameisha Stofko, OTR/L  [phone] 01/23/2013 01/23/2013, 12:19 PM

## 2013-01-23 NOTE — Progress Notes (Signed)
PT Cancellation Note  Patient Details  Name: [name]  MRN: [id number]  DOB: January 16, 1934  Cancelled Treatment: Reason Eval/Treat Not Completed: Patient at procedure or test/ unavailable  Charlotte Crumb, PT DPT  [phone]

## 2013-01-23 NOTE — Progress Notes (Signed)
Subjective: No complaints  Objective: Vital signs in last 24 hours: Temp:  [97.7 F (36.5 C)-99.1 F (37.3 C)] 99.1 F (37.3 C) (07/11 0428) Pulse Rate:  [82-101] 91 (07/11 0428) Resp:  [15-36] 20 (07/11 0428) BP: (85-214)/(40-77) 99/65 mmHg (07/11 0428) SpO2:  [94 %-100 %] 97 % (07/11 0428) Weight:  [211 lb 10.3 oz (96 kg)-216 lb 7.9 oz (98.2 kg)] 211 lb 13.8 oz (96.1 kg) (07/11 0428) Weight change: -3.5 oz (-0.1 kg) Last BM Date: 01/21/13 Intake/Output from previous day: -1517 (-10.105 from last visit)  Wt 211.13 dowm from, 256 on admit.  07/10 0701 - 07/11 0700 In: 600 [P.O.:600] Out: 2117  Intake/Output this shift:    PE: General:Pleasant affect, NAD Skin:Warm and dry, brisk capillary refill HEENT:normocephalic, sclera clear, mucus membranes moist Neck:supple, no JVD, no bruits  Heart:S1S2 RRR with 2/6 systolic murmur, no gallup, rub or click Lungs:clear without rales, rhonchi, or wheezes ZOX:WRUE, non tender, + BS, do not palpate liver spleen or masses Ext:no lower ext edema, legs wrapped for ulcers. Neuro:alert and oriented, MAE, follows commands, + facial symmetry  TELE:SR with rare PVCs   Lab Results:  Recent Labs  01/22/13 1118 01/23/13 0533  WBC 12.6* 11.0*  HGB 10.9* 10.9*  HCT 34.0* 34.5*  PLT 129* 77*   BMET  Recent Labs  01/22/13 1118 01/23/13 0533  NA 137 137  K 4.0 3.4*  CL 100 101  CO2 28 28  GLUCOSE 70 116*  BUN 30* 15  CREATININE 2.15* 1.56*  CALCIUM 8.5 8.0*   No results found for this basename: TROPONINI, CK, MB,  in the last 72 hours   Hepatic Function Panel  Recent Labs  01/22/13 1118  ALBUMIN 2.2*     Studies/Results: No results found.  Medications: I have reviewed the patient's current medications. Scheduled Meds: . allopurinol  150 mg Oral Daily  . antiseptic oral rinse  15 mL Mouth Rinse BID  . darbepoetin (ARANESP) injection - DIALYSIS  200 mcg Intravenous Q Thu-HD  . feeding supplement  1 Container  Oral BID BM  . insulin aspart  0-9 Units Subcutaneous TID WC  . insulin aspart protamine- aspart  10 Units Subcutaneous Q breakfast  . insulin aspart protamine- aspart  5 Units Subcutaneous Q supper  . multivitamin  1 tablet Oral QHS  . pantoprazole  40 mg Oral Daily  . phenytoin  300 mg Oral QHS  . simvastatin  10 mg Oral QHS  . sodium chloride  10 mL Intravenous Q12H  . Warfarin - Pharmacist Dosing Inpatient   Does not apply q1800   Continuous Infusions: . sodium chloride Stopped (01/17/13 0800)   PRN Meds:.acetaminophen, ALPRAZolam, camphor-menthol, ondansetron (ZOFRAN) IV, oxyCODONE, senna-docusate, sodium chloride, zolpidem  Assessment/Plan: Principal Problem:   Acute on chronic diastolic CHF (congestive heart failure), NYHA class 3, 09/25/12 Active Problems:   Sleep apnea on c-pap    Aortic insufficiency: mild   Chronic venous insufficiency with LE ulcers, will be followed at wound center   Diabetes mellitus type 2 in obese   Hypertrophic obstructive cardiomyopathy(425.11)   History of Rt brain CVA   PAF (paroxysmal atrial fibrillation),    Long term (current) use of anticoagulants   Chronic kidney disease (CKD), stage IV (severe), now with HD   Cardiorenal syndrome with renal failure   Cardiogenic shock   Hypotension   Acute blood loss anemia  PLAN: Dialysis yesterday.  Possible transfer to [facility] today.  Dialysis TTS schedule.  At [facility]. Anemia with Iron and ESA. BP borderline at times.  HGB stable. Back on coumadin INR 2.68. Replace K+  LOS: 24 days    [facility]  Nurse Practitioner Certified Pager [phone] 01/23/2013, 8:03 AM

## 2013-01-23 NOTE — Progress Notes (Addendum)
Pt. Seen and examined. Agree with the NP/PA-C note as written.  Hopeful transfer to SNF soon.  Don't know what to make of thrombocytopenia - her platelet counts have been up and down.  I suspect platelet clumping with dialysis. Will send a peripheral smear. Dialysis has been arranged as an outpatient. We would like to see her for close heart failure follow-up in 5-7 days (TCM 7) as she is a complex patient.  Chrystie Nose, MD, [facility] Attending Cardiologist [facility]

## 2013-01-23 NOTE — Progress Notes (Signed)
ANTICOAGULATION CONSULT NOTE - Follow Up Consult  Pharmacy Consult for Coumadin Indication: Afib/DVT  Allergies  Allergen Reactions  . Heparin     HIT panel pending    Patient Measurements: Height: 5\' 5"  (165.1 cm) Weight: 211 lb 13.8 oz (96.1 kg) IBW/kg (Calculated) : 57 Heparin Dosing Weight:    Vital Signs: Temp: 99.1 F (37.3 C) (07/11 0428) Temp src: Oral (07/11 0428) BP: 99/65 mmHg (07/11 0428) Pulse Rate: 91 (07/11 0428)  Labs:  Recent Labs  01/21/13 0500 01/22/13 0530 01/22/13 1118 01/23/13 0533  HGB 10.9* 10.9* 10.9* 10.9*  HCT 34.1* 34.3* 34.0* 34.5*  PLT 94* 121* 129* 77*  LABPROT 26.9* 29.1*  --  27.6*  INR 2.59* 2.87*  --  2.68*  CREATININE 1.89* 2.18* 2.15* 1.56*    Estimated Creatinine Clearance: 34.6 ml/min (by C-G formula based on Cr of 1.56).  Assessment: Anasarca and ARF in the setting of decompensated HF  Anticoag: Warf resumed from PTA for hx Afib/DVT. PTA dose was 2.5 mg daily EXCEPT for 1.25 mg on M/F.  6/29: Vit K 2.5 mg given on 6/29. 6/30: Coumadin resumed post- AVF placement. Pt with LUE hematoma (5x2cm), + smaller hematoma on forearm s/p AVF placed --noted improved. Warf held x 2 days per VVS, 7/5: Coumadin resumed.  --INR 2.68 in goal today (note lower INR goal 2-2.5). Hgb stable at 10.9. Plts down to 77. HIT panel pending.  Goal of Therapy:  INR 2-2.5 Monitor platelets by anticoagulation protocol: Yes   Plan:  - heparin allergy added until HIT panel results - Repeat Coumadin 0.5mg  po x 1 today.    Key Cen S. Merilynn Finland, PharmD, BCPS Clinical Staff Pharmacist Pager [phone]  Misty Stanley Stillinger 01/23/2013,9:33 AM

## 2013-01-23 NOTE — Progress Notes (Signed)
Patient ID: [name], female   DOB: 09/02/1933, 79 y.o.   MRN: [id number]   [facility] Progress Note    Subjective:   Overnight events noted with continued refusal of CPAP-she denies any current chest pain or shortness of breath. Anticipated discharge to skilled nursing facility today.    Objective:   BP 99/65  Pulse 91  Temp(Src) 99.1 F (37.3 C) (Oral)  Resp 20  Ht 5\' 5"  (1.651 m)  Wt 96.1 kg (211 lb 13.8 oz)  BMI 35.26 kg/m2  SpO2 97%  Physical Exam: Gen: Comfortably resting in bed, intermittently somnolent CVS: Pulse regular in rate and rhythm, heart sounds S1 and S2 normal Resp: Poor inspiratory effort, decreased breath sounds bibasally-no rales Abd: Soft, obese, nontender and bowel sounds are normal Ext: Lower extremities in Ace wrap-proximal edema appreciated around knees  Labs: BMET  Recent Labs Lab 01/17/13 1256  01/19/13 0451 01/20/13 0500 01/20/13 0746 01/21/13 0500 01/22/13 0530 01/22/13 1118 01/23/13 0533  NA 132*  < > 135 136 135 137 138 137 137  K 3.9  < > 4.2 4.0 4.0 3.8 3.8 4.0 3.4*  CL 96  < > 100 99 99 101 99 100 101  CO2 24  < > 27 25 25 30 27 28 28   GLUCOSE 104*  < > 121* 111* 114* 134* 118* 70 116*  BUN 39*  < > 34* 42* 42* 23 29* 30* 15  CREATININE 2.67*  < > 2.43* 2.69* 2.69* 1.89* 2.18* 2.15* 1.56*  CALCIUM 8.4  < > 8.4 8.6 8.4 8.1* 8.4 8.5 8.0*  PHOS 4.1  --   --   --  3.7  --   --  2.8  --   < > = values in this interval not displayed. CBC  Recent Labs Lab 01/21/13 0500 01/22/13 0530 01/22/13 1118 01/23/13 0533  WBC 12.3* 11.9* 12.6* 11.0*  HGB 10.9* 10.9* 10.9* 10.9*  HCT 34.1* 34.3* 34.0* 34.5*  MCV 98.8 98.6 98.6 100.3*  PLT 94* 121* 129* 77*    @IMGRELPRIORS @ Medications:    . allopurinol  150 mg Oral Daily  . antiseptic oral rinse  15 mL Mouth Rinse BID  . darbepoetin (ARANESP) injection - DIALYSIS  200 mcg Intravenous Q Thu-HD  . feeding supplement  1 Container Oral BID BM  . insulin aspart   0-9 Units Subcutaneous TID WC  . insulin aspart protamine- aspart  10 Units Subcutaneous Q breakfast  . insulin aspart protamine- aspart  5 Units Subcutaneous Q supper  . multivitamin  1 tablet Oral QHS  . pantoprazole  40 mg Oral Daily  . phenytoin  300 mg Oral QHS  . simvastatin  10 mg Oral QHS  . sodium chloride  10 mL Intravenous Q12H  . warfarin  0.5 mg Oral ONCE-1800  . Warfarin - Pharmacist Dosing Inpatient   Does not apply q1800     Assessment/ Plan:   1. Hypotension: Remains with fair blood pressures of antihypertensive therapy. No further evidence of ongoing bleeding. Ongoing physical therapy with the plan of placement to a skilled nursing facility for ongoing rehabilitation (possibly discharge to [facility] today).  2. ESRD: on TTS dialysis schedule and set up for continued HD upon DC at [facility]. -Scheduled brief hemodialysis treatment today (2 and half hours) given her inability to start dialysis at outpatient Center Saturday- plans for likely discharge thereafter if she remains clinically stable. This will allow her to go to dialysis on Tuesday without  anticipated problems. 3. Anemia:Hgb stable and improved s/p PRBCs- now on IV Fe and ESA.  4. CKD-MBD: PTH levels at goal for ESRD, phosphorus levels at goal off binders.  5. Nutrition: continue supplementation for now. Remains hypoalbuminemic (likely malnutrition of uremia/CKD).    Zetta Bills, MD 01/23/2013, 11:58 AM

## 2013-01-23 NOTE — Procedures (Signed)
Patient seen on Hemodialysis. QB 350, UF goal 2.5L Treatment adjusted as needed.  Zetta Bills MD [facility]. Office # [phone] Pager # [phone] 12:04 PM

## 2013-01-26 ENCOUNTER — Non-Acute Institutional Stay (SKILLED_NURSING_FACILITY): Payer: Medicare Other | Admitting: Internal Medicine

## 2013-01-26 ENCOUNTER — Other Ambulatory Visit: Payer: Self-pay | Admitting: Geriatric Medicine

## 2013-01-26 ENCOUNTER — Ambulatory Visit: Payer: Medicare Other | Admitting: Cardiology

## 2013-01-26 ENCOUNTER — Telehealth: Payer: Self-pay | Admitting: Cardiology

## 2013-01-26 DIAGNOSIS — R5383 Other fatigue: Secondary | ICD-10-CM

## 2013-01-26 DIAGNOSIS — I872 Venous insufficiency (chronic) (peripheral): Secondary | ICD-10-CM

## 2013-01-26 DIAGNOSIS — I480 Paroxysmal atrial fibrillation: Secondary | ICD-10-CM

## 2013-01-26 DIAGNOSIS — E119 Type 2 diabetes mellitus without complications: Secondary | ICD-10-CM

## 2013-01-26 DIAGNOSIS — G4730 Sleep apnea, unspecified: Secondary | ICD-10-CM

## 2013-01-26 DIAGNOSIS — I4891 Unspecified atrial fibrillation: Secondary | ICD-10-CM

## 2013-01-26 DIAGNOSIS — R569 Unspecified convulsions: Secondary | ICD-10-CM

## 2013-01-26 DIAGNOSIS — N184 Chronic kidney disease, stage 4 (severe): Secondary | ICD-10-CM

## 2013-01-26 DIAGNOSIS — E669 Obesity, unspecified: Secondary | ICD-10-CM

## 2013-01-26 DIAGNOSIS — R531 Weakness: Secondary | ICD-10-CM | POA: Insufficient documentation

## 2013-01-26 MED ORDER — OXYCODONE HCL 5 MG PO TABS: 5.0000 mg | ORAL_TABLET | ORAL | Status: DC | PRN

## 2013-01-26 NOTE — Telephone Encounter (Signed)
Talked to Sheralyn Boatman, Ms. Barley's nurse at [facility].  She did well over the weekend.  No SOB, No pain.  No complaints.  She is scheduled for dialysis on Tuesday, Thurs, and Sat.  They are aware she is to see Corine Shelter, [facility] for follow up on the 16 th this month.

## 2013-01-26 NOTE — Progress Notes (Signed)
Patient ID: [name], female   DOB: Jan 08, 1934, 79 y.o.   MRN: [id number]    PCP: Alva Garnet., MD  Code Status: full code  Allergies  Allergen Reactions  . Heparin     HIT panel pending    Chief Complaint: new admit post hospital admission 12/30/12- 01/23/13  HPI:   79 y/o female patient with medical history of chf, OSA, morbid obesity, chronic venous insufficiency with LE ulcers, DM, CVA, Afib among others was admitted to the hospital on above date with CHF exacerbation. She was also hypotensive and had worsening of her renal function. She was admitted to the step down unit. She had multiorgan failure and required CVVHD and was put on pressors. Her volume overload improved and by 01/06/13 it was felt that she would tolerate conventional dialysis and dialysis was started 3 days a week with last one on 01/23/13. She has been sent to SNF for STR. She was seen in her room today with her husband present. She denies any complaints.she feels that after a long time, she has a good appetite today. She feels weak.    Review of Systems  Constitutional: Negative for fever, chills and diaphoresis.  HENT: Negative for congestion.   Eyes: Negative for blurred vision.  Respiratory: Negative for cough and shortness of breath.   Cardiovascular: Negative for chest pain and palpitations.  Gastrointestinal: Negative for heartburn, nausea, vomiting, abdominal pain and constipation.  Genitourinary: Negative for dysuria.  Musculoskeletal: Negative for joint pain.  Skin: Negative for rash.  Neurological: Positive for weakness. Negative for dizziness and headaches.  Psychiatric/Behavioral: Negative for depression and memory loss.     Past Medical History  Diagnosis Date  . Hypertension   . Venous stasis of lower extremity WEARS TEDS    BLE--  CURRENTLY NO ULCERS/ WOUNDS  . Aortic insufficiency     mild/H&P 04/02/2012  . Type II diabetes mellitus   . OSA on CPAP   . Pulmonary hypertension  per last echo 03-28-2012  mod.-severe     per dr wert note this related to L ht pressures from diastolic chf  . Hypertrophic obstructive cardiomyopathy(425.11)   . Arthritis   . CKD (chronic kidney disease) stage 3, GFR 30-59 ml/min   . Heart murmur   . History of DVT of lower extremity lower right leg nov 2012  . Hyperlipidemia   . History of gout 06-09-2012  per pt stable  . Chronic diastolic CHF (congestive heart failure), NYHA class 3 CARDIOLOGIST- DR HILTY--  LOV OCT 2013--  REQUESTED NOTE, EGK AND STRESS TEST    PER PT ON 06-09-2012  S&S OF CHF AND WT IS DOWN  . H/O hiatal hernia   . GERD (gastroesophageal reflux disease)   . Lower extremity edema   . PAF (paroxysmal atrial fibrillation), in SR on admit 09/25/2012  . Stroke Jan 2014    Rt brain, Nl CA doppler  . TIA (transient ischemic attack) 08/07/2012    2D Echo - EF 60-65, severe concentric hypertrophy in the left ventricle  . Dyspnea on exertion 11/30/2010    Gated Stress Dipyridamole Myocardial Perfusion - post stress EF-63, normal study, no ischemia demonstrated   . Hematoma, lt upper arm 01/23/2013   Past Surgical History  Procedure Laterality Date  . Total knee arthroplasty  1988; 1998 X2    right; bilateral  . Tumor excision  1970's    "off my stomach"  . Cataract extraction w/ intraocular lens implant  ~ 2010  right; "put lens in; didn't work; had mass on my eye"  . Transthoracic echocardiogram  04-03-2012   DR KENNETH HILTY    LVSF  EF 80-85%/ HYPERTRONIC CARDIOMYOPATHY WITH MOSTLY MID-CAVITARY OBSTRUCTION AND A SMALLER COMPONENT OF LV OUTFLOW TRACT OBSTRUCTION/   EVIDENCE OF SEVERE DECOMPRESATION OF DIASTOLIC HEART FAILURE AND MODERATE TO SEVERE PULMONARY ARTERIAL HYPERTENSION  . Joint replacement  '88 and '98    TOTAL RIGHT KNEE X2  1988  &  1998 (DUE TO JOINT MALFUNCTION)   TOTAL LEFT KNEE 1998  . Carpometacarpel suspension plasty  06/16/2012    Procedure: CARPOMETACARPEL [facility]) SUSPENSION PLASTY;  Surgeon: Jodi Marble, MD;  Location: [facility];  Service: Orthopedics;  Laterality: Right;  RIGHT THUMB SUSPENSION ARTHROPLASTY.  Marland Kitchen Eye surgery Right ~ 2011    "not a cataract; just a growth" (09/26/2012)  . Cardiac catheterization Bilateral 01/19/2004    NL cors. neg Nuc 2012  . Av fistula placement N/A 01/12/2013    Procedure: ARTERIOVENOUS (AV) FISTULA CREATION;  Surgeon: Larina Earthly, MD;  Location: [facility] OR;  Service: Vascular;  Laterality: N/A;  . Insertion of dialysis catheter N/A 01/12/2013    Procedure: INSERTION OF DIALYSIS CATHETER;  Surgeon: Larina Earthly, MD;  Location[facility] OR;  Service: Vascular;  Laterality: N/A;   Social History:   reports that she has never smoked. She has never used smokeless tobacco. She reports that  drinks alcohol. She reports that she does not use illicit drugs.  Family History  Problem Relation Age of Onset  . Other Mother   . Coronary artery disease Father     sudden death in his 61's  . Diabetes Father   . Cancer Father   . Diabetes Sister     Medications: Patient's Medications  New Prescriptions   No medications on file  Previous Medications   ACETAMINOPHEN (TYLENOL) 325 MG TABLET    Take 2 tablets (650 mg total) by mouth every 4 (four) hours as needed.   ALLOPURINOL (ZYLOPRIM) 300 MG TABLET    Take 150 mg by mouth daily.   ALPRAZOLAM (XANAX) 0.25 MG TABLET    Take 1 tablet (0.25 mg total) by mouth 2 (two) times daily as needed for anxiety.   CAMPHOR-MENTHOL (SARNA) LOTION    Apply topically as needed for itching.   DARBEPOETIN (ARANESP) 200 MCG/0.4ML SOLN    Inject 0.4 mLs (200 mcg total) into the vein every Thursday with hemodialysis.   FEEDING SUPPLEMENT (RESOURCE BREEZE) LIQD    Take 1 Container by mouth 2 (two) times daily between meals.   INSULIN ASPART (NOVOLOG) 100 UNIT/ML INJECTION    Inject 0-9 Units into the skin 3 (three) times daily with meals.   INSULIN ASPART PROTAMINE- ASPART (NOVOLOG MIX 70/30) (70-30) 100 UNIT/ML INJECTION     Inject 0.05 mLs (5 Units total) into the skin daily with supper.   INSULIN ASPART PROTAMINE- ASPART (NOVOLOG MIX 70/30) (70-30) 100 UNIT/ML INJECTION    Inject 0.1 mLs (10 Units total) into the skin daily with breakfast.   LOPERAMIDE (IMODIUM) 2 MG CAPSULE    Take 1 capsule (2 mg total) by mouth as needed for diarrhea or loose stools.   MULTIVITAMIN (RENA-VIT) TABS TABLET    Take 1 tablet by mouth at bedtime.   OMEPRAZOLE (PRILOSEC) 20 MG CAPSULE    Take 20 mg by mouth every morning.    OXYCODONE (OXY IR/ROXICODONE) 5 MG IMMEDIATE RELEASE TABLET    Take 1-2 tablets (5-10  mg total) by mouth every 2 (two) hours as needed.   PHENYTOIN (DILANTIN) 300 MG ER CAPSULE    Take 1 capsule (300 mg total) by mouth at bedtime.   SENNA-DOCUSATE (SENOKOT-S) 8.6-50 MG PER TABLET    Take 1 tablet by mouth at bedtime as needed for constipation.   SIMVASTATIN (ZOCOR) 10 MG TABLET    Take 10 mg by mouth at bedtime.   WARFARIN (COUMADIN) 1 MG TABLET    Take 0.5-1 tablets (0.5-1 mg total) by mouth as directed.   ZOLPIDEM (AMBIEN) 5 MG TABLET    Take 1 tablet (5 mg total) by mouth at bedtime as needed for sleep.  Modified Medications   No medications on file  Discontinued Medications   No medications on file     Physical Exam:  Filed Vitals:   01/26/13 1821  BP: 120/66  Pulse: 84  Temp: 98.9 F (37.2 C)  Resp: 20  SpO2: 95%   General: morbidly obese, in NAD, pleasant Skin:Warm and dry, brisk capillary refill, left upper arm fistula area dry and clean, dialysis port on right side of the chest clean  HEENT:normocephalic, sclera clear, mucus membranes moist   Neck:supple, no JVD, no bruits   Heart:S1S2 RRR with 2/6 systolic murmur, no gallup, rub or click   Lungs:clear without rales, rhonchi, or wheezes   GNF:AOZH, non tender, BS+ Ext:no LE edema, legs wrapped for ulcers, left UE edema present Neuro:alert and oriented x 3, no focal deficit    Labs reviewed: Basic Metabolic Panel:  Recent Labs   01/04/13 0400  01/05/13 0500  01/06/13 0420  01/17/13 1256  01/20/13 0746  01/22/13 0530 01/22/13 1118 01/23/13 0533  NA 134*  < > 136  < > 137  < > 132*  < > 135  < > 138 137 137  K 4.8  < > 4.7  < > 4.5  < > 3.9  < > 4.0  < > 3.8 4.0 3.4*  CL 101  < > 103  < > 102  < > 96  < > 99  < > 99 100 101  CO2 28  < > 27  < > 27  < > 24  < > 25  < > 27 28 28   GLUCOSE 86  < > 101*  < > 106*  < > 104*  < > 114*  < > 118* 70 116*  BUN 21  < > 19  < > 18  < > 39*  < > 42*  < > 29* 30* 15  CREATININE 1.45*  < > 1.27*  < > 1.14*  < > 2.67*  < > 2.69*  < > 2.18* 2.15* 1.56*  CALCIUM 8.5  < > 8.1*  < > 8.6  < > 8.4  < > 8.4  < > 8.4 8.5 8.0*  MG 2.3  --  2.3  --  2.2  --   --   --   --   --   --   --   --   PHOS 2.1*  < > 2.5  < > 2.0*  < > 4.1  --  3.7  --   --  2.8  --   < > = values in this interval not displayed. Liver Function Tests:  Recent Labs  12/31/12 0440  01/01/13 0730  01/17/13 1256 01/20/13 0746 01/22/13 1118  AST 36  --  39*  --  51*  --   --   ALT  26  --  22  --  29  --   --   ALKPHOS 101  --  91  --  108  --   --   BILITOT 0.4  --  0.5  --  1.2  --   --   PROT 6.3  --  5.9*  --  5.2*  --   --   ALBUMIN 2.8*  < > 2.5*  < > 2.3* 2.4* 2.2*  < > = values in this interval not displayed. CBC:  Recent Labs  08/07/12 1200  12/05/12 1855  12/30/12 1652  01/22/13 1118 01/23/13 0533 01/23/13 1205  WBC 11.7*  < > 9.2  < > 8.6  < > 12.6* 11.0* 11.3*  NEUTROABS 8.6*  --  7.4  --  6.4  --   --   --   --   HGB 13.4  < > 12.0  < > 12.9  < > 10.9* 10.9* 11.1*  HCT 40.1  < > 37.0  < > 38.8  < > 34.0* 34.5* 35.1*  MCV 91.1  < > 94.9  < > 93.3  < > 98.6 100.3* 100.3*  PLT 214  < > 236  < > 205  < > 129* 77* 88*  < > = values in this interval not displayed. Cardiac Enzymes:  Recent Labs  12/30/12 2115 12/31/12 0440 12/31/12 0930  TROPONINI <0.30 <0.30 <0.30   BNP: No components found with this basename: POCBNP,  CBG:  Recent Labs  01/23/13 0626 01/23/13 1108  01/23/13 1614  GLUCAP 120* 150* 114*    Assessment/Plan  Generalized weakness- s/p sepsis and multi organ failure and debility besides chronic medical co-morbidities. She will need to work with therapy for muscle strengthening exercise and gait training. Continue diabetic and renal diet.   ESRD- s/p dialysis 3/week. Will keep left arm elevated and monitor hematoma in left arm  Anemia of chronic disease- continue aransep, check cbc at dialysis  DM- monitor cbg, continue current regimen of insulin, statin  afib- rate under control. Continue coumadin for anticoagulation . Goal inr 2-3.   Seizure- remains seizure free, continue dilantin and monitor clinically  Chronic venous insufficiency- s/p wraps in both LE ulcers, continue wound care  Sleep apnea- not using CPAP, on o2 by nasal canula  Family/ staff Communication: reviewed care plan with patient, her husband and nursing supervisor   Labs/tests ordered- cbc, cmp, inr

## 2013-01-27 LAB — HEPARIN INDUCED THROMBOCYTOPENIA PNL: Heparin Induced Plt Ab: POSITIVE

## 2013-01-28 ENCOUNTER — Encounter: Payer: Self-pay | Admitting: Cardiology

## 2013-01-28 ENCOUNTER — Other Ambulatory Visit: Payer: Self-pay | Admitting: *Deleted

## 2013-01-28 ENCOUNTER — Ambulatory Visit (INDEPENDENT_AMBULATORY_CARE_PROVIDER_SITE_OTHER): Payer: Medicare Other | Admitting: Cardiology

## 2013-01-28 VITALS — BP 136/78 | HR 85 | Ht 66.0 in | Wt 202.0 lb

## 2013-01-28 DIAGNOSIS — I872 Venous insufficiency (chronic) (peripheral): Secondary | ICD-10-CM

## 2013-01-28 DIAGNOSIS — I4891 Unspecified atrial fibrillation: Secondary | ICD-10-CM

## 2013-01-28 DIAGNOSIS — E669 Obesity, unspecified: Secondary | ICD-10-CM

## 2013-01-28 DIAGNOSIS — N184 Chronic kidney disease, stage 4 (severe): Secondary | ICD-10-CM

## 2013-01-28 DIAGNOSIS — E1169 Type 2 diabetes mellitus with other specified complication: Secondary | ICD-10-CM

## 2013-01-28 DIAGNOSIS — E119 Type 2 diabetes mellitus without complications: Secondary | ICD-10-CM

## 2013-01-28 DIAGNOSIS — I480 Paroxysmal atrial fibrillation: Secondary | ICD-10-CM

## 2013-01-28 DIAGNOSIS — I5033 Acute on chronic diastolic (congestive) heart failure: Secondary | ICD-10-CM

## 2013-01-28 DIAGNOSIS — I509 Heart failure, unspecified: Secondary | ICD-10-CM

## 2013-01-28 DIAGNOSIS — I10 Essential (primary) hypertension: Secondary | ICD-10-CM

## 2013-01-28 DIAGNOSIS — N186 End stage renal disease: Secondary | ICD-10-CM

## 2013-01-28 DIAGNOSIS — Z4931 Encounter for adequacy testing for hemodialysis: Secondary | ICD-10-CM

## 2013-01-28 NOTE — Assessment & Plan Note (Signed)
Dr Powell follows 

## 2013-01-28 NOTE — Assessment & Plan Note (Signed)
controlled 

## 2013-01-28 NOTE — Progress Notes (Signed)
01/28/2013 [name]   Nov 25, 1933  [id number]  Primary Physicia Alva Garnet., MD Primary Cardiologist: Dr Rennis Golden  HPI:  79 y/o female with a long history of chronic diastolic CHF as well as multiple other medical problems, including, obesity, OSA, normal coronaries in 2005,CRI, DM, and HTN. She was recently admitted with septic shock. She was discharged 01/23/13 and is seen today as a TCM pt. She is now on permanent dialysis Tuesday, Thursday, and Saturday. Dr Lowell Guitar is her nephrologist. She was discharged to [facility]. She is scheduled to go to the wound clinic for her chronic LE ulcers. She just had an AVF placed last week in her Lt arm for HD.   Current Outpatient Prescriptions  Medication Sig Dispense Refill  . acetaminophen (TYLENOL) 325 MG tablet Take 2 tablets (650 mg total) by mouth every 4 (four) hours as needed.      Marland Kitchen allopurinol (ZYLOPRIM) 300 MG tablet Take 150 mg by mouth daily.      Marland Kitchen ALPRAZolam (XANAX) 0.25 MG tablet Take 1 tablet (0.25 mg total) by mouth 2 (two) times daily as needed for anxiety.  30 tablet  0  . darbepoetin (ARANESP) 200 MCG/0.4ML SOLN Inject 0.4 mLs (200 mcg total) into the vein every Thursday with hemodialysis.  1.68 mL    . feeding supplement (RESOURCE BREEZE) LIQD Take 1 Container by mouth 2 (two) times daily between meals.    0  . insulin aspart protamine- aspart (NOVOLOG MIX 70/30) (70-30) 100 UNIT/ML injection Inject 0.05 mLs (5 Units total) into the skin daily with supper.  10 mL  12  . insulin aspart protamine- aspart (NOVOLOG MIX 70/30) (70-30) 100 UNIT/ML injection Inject 0.1 mLs (10 Units total) into the skin daily with breakfast.  10 mL  12  . insulin lispro (HUMALOG) 100 UNIT/ML injection Inject 2-5 Units into the skin 3 (three) times daily before meals. Sliding scale      . loperamide (IMODIUM) 2 MG capsule Take 1 capsule (2 mg total) by mouth as needed for diarrhea or loose stools.  30 capsule  0  . multivitamin (RENA-VIT) TABS tablet  Take 1 tablet by mouth at bedtime.    0  . omeprazole (PRILOSEC) 20 MG capsule Take 20 mg by mouth every morning.       Marland Kitchen oxyCODONE (OXY IR/ROXICODONE) 5 MG immediate release tablet Take 1-2 tablets (5-10 mg total) by mouth every 2 (two) hours as needed.  360 tablet  0  . phenytoin (DILANTIN) 300 MG ER capsule Take 1 capsule (300 mg total) by mouth at bedtime.  30 capsule  2  . senna-docusate (SENOKOT-S) 8.6-50 MG per tablet Take 1 tablet by mouth at bedtime as needed for constipation.      . simvastatin (ZOCOR) 10 MG tablet Take 10 mg by mouth at bedtime.      Marland Kitchen warfarin (COUMADIN) 1 MG tablet Take 0.5-1 tablets (0.5-1 mg total) by mouth as directed.      . zolpidem (AMBIEN) 5 MG tablet Take 1 tablet (5 mg total) by mouth at bedtime as needed for sleep.  30 tablet  0   No current facility-administered medications for this visit.    No Known Allergies  History   Social History  . Marital Status: Married    Spouse Name: N/A    Number of Children: 2  . Years of Education: N/A   Occupational History  . Retired    Social History Main Topics  . Smoking status: Never Smoker   .  Smokeless tobacco: Never Used  . Alcohol Use: No  . Drug Use: No  . Sexually Active: No   Other Topics Concern  . Not on file   Social History Narrative  . No narrative on file     Review of Systems: General: negative for chills, fever, night sweats or weight changes.  Cardiovascular: negative for chest pain, dyspnea on exertion, edema, orthopnea, palpitations, paroxysmal nocturnal dyspnea or shortness of breath Dermatological: negative for rash Respiratory: negative for cough or wheezing Urologic: negative for hematuria Abdominal: negative for nausea, vomiting, diarrhea, bright red blood per rectum, melena, or hematemesis Neurologic: negative for visual changes, syncope, or dizziness All other systems reviewed and are otherwise negative except as noted above.    Blood pressure 136/78, pulse 85,  height 5\' 6"  (1.676 m), weight 202 lb (91.627 kg).  General appearance: alert, cooperative, no distress and morbidly obese Lungs: decreased breath sounds at the bases Heart: regular rate and rhythm Extremities: Lt arm AVF covered with dressing. Both lower extremities covered by compression wraps  EKG  EKG: normal EKG, normal sinus rhythm, unchanged from previous tracings.  ASSESSMENT AND PLAN:   Acute on chronic diastolic CHF (congestive heart failure), NYHA class 3, 09/25/12 Discharge 01/23/13- now on dialysis T/Th/Sat  Chronic venous insufficiency with LE ulcers, will be followed at wound center, currently with wraps .  Diabetes mellitus type 2 in obese .  PAF (paroxysmal atrial fibrillation),  NSR today  HTN (hypertension) controlled  Chronic kidney disease (CKD), stage IV (severe), now with HD Dr Lowell Guitar follows   PLAN  Same cardiac Rx. Her Coumadin can be followed at [facility]. Her chronic diastolic heart failure will be easier to manage now that she is on HD.   [facility] 01/28/2013 4:23 PM

## 2013-01-28 NOTE — Patient Instructions (Signed)
See Dr Rennis Golden in 3mos

## 2013-01-28 NOTE — Assessment & Plan Note (Signed)
NSR today 

## 2013-01-28 NOTE — Assessment & Plan Note (Signed)
Discharge 01/23/13- now on dialysis T/Th/Sat

## 2013-02-02 ENCOUNTER — Non-Acute Institutional Stay (SKILLED_NURSING_FACILITY): Payer: Medicare Other | Admitting: Internal Medicine

## 2013-02-02 DIAGNOSIS — B0233 Zoster keratitis: Secondary | ICD-10-CM

## 2013-02-02 DIAGNOSIS — B029 Zoster without complications: Secondary | ICD-10-CM

## 2013-02-02 NOTE — Progress Notes (Signed)
Patient ID: [name], female   DOB: 09-18-1933, 79 y.o.   MRN: [id number]  [facility]  CC- lesions on face x 3 days  HPI- 79 y/o female patient with medical history of chf, OSA, morbid obesity, chronic venous insufficiency with LE ulcers, DM, CVA, Afib among others here for rehab post hospitalization for CHF exacerbation was seen today with staff noticing lesions on her face. She has recently been started on dialysis for ESRD 3 times a wek. Staff noticed few lesions on her face on Saturday and called the on call provider who recommended starting gancyclovir. It was unavailable in the facility and she was started on valcyclovir 500 gm q12h yesterday. She has been having pain on her face and lips. She is not a great historian but is clearly uncomfortable. She is unable to open her right eye which is completely matted and complaints of discomfort in her right eye.   Review of Systems  Constitutional: Negative for fever, chills and diaphoresis.  HENT: Negative for congestion.   Respiratory: Negative for cough and shortness of breath.   Cardiovascular: Negative for chest pain and palpitations.  Gastrointestinal: Negative for heartburn, nausea, vomiting, abdominal pain and constipation.  Genitourinary: Negative for dysuria.  Musculoskeletal: Negative for joint pain.   Neurological: Positive for weakness. Negative for dizziness and headaches.  Psychiatric/Behavioral: Negative for depression and memory loss.   Vital signs reviewed. Remains afebrile. 98.2, 70/min, 18/min, 136/68, 975 on room air  General: morbidly obese, in NAD, pleasant Skin:left upper arm fistula area dry and clean, dialysis port on right side of the chest clean , has vesicles in clusters on right side of the face - forehead, labial fold, cheek and chin, no vesicles on the eye or around the eyelid. Brownish material and clear material drainage from few open vesicles HEENT:normocephalic, mucus membranes moist , right  eye matted, opened it with two fingers, PERRLA, red injected conjunctiva Neck:supple, no JVD, no bruits   Heart:S1S2 RRR with 2/6 systolic murmur, no gallup, rub or click   Lungs:clear without rales, rhonchi, or wheezes   WUJ:WJXB, non tender, BS+ Ext:no LE edema, legs wrapped for ulcers, left UE edema present Neuro:alert and oriented x 3, no focal deficit  A/p-  Shingles- patient has clear picture of herpes zoster. Will change her valtrex to 1000 mg bid for 2 weeks for now given the severity of the lesion and that she is on dialysis with creatinine clearance > 30 ml/min for now. Will start her on prednsione 50 mg daily for a week to help with inflammation. Will also have her on tramadol 50 mg every 8 hours for now and oxyIR 5 mg q2h prn for pain.. Will have her skin cleaned with soap and water. Will also provide ophthalmology appointment for today if not earlier tomorrow to assess for herpes ophthalmicus. After completion of this treatment, pt will need zoster vaccine in 6 months for further prevention. Monitor for signs of further bacterial infections/ cellulitis  Zoster ophthalmicus- have started her on prednsione and valtrex for now. Will have her referred to ophthalmology for further assessment and treatment

## 2013-02-04 ENCOUNTER — Non-Acute Institutional Stay (SKILLED_NURSING_FACILITY): Payer: Medicare Other | Admitting: Adult Health

## 2013-02-04 DIAGNOSIS — N186 End stage renal disease: Secondary | ICD-10-CM

## 2013-02-04 DIAGNOSIS — N184 Chronic kidney disease, stage 4 (severe): Secondary | ICD-10-CM

## 2013-02-04 DIAGNOSIS — E1122 Type 2 diabetes mellitus with diabetic chronic kidney disease: Secondary | ICD-10-CM

## 2013-02-04 DIAGNOSIS — I4891 Unspecified atrial fibrillation: Secondary | ICD-10-CM

## 2013-02-04 DIAGNOSIS — I480 Paroxysmal atrial fibrillation: Secondary | ICD-10-CM

## 2013-02-04 DIAGNOSIS — K219 Gastro-esophageal reflux disease without esophagitis: Secondary | ICD-10-CM

## 2013-02-04 DIAGNOSIS — E785 Hyperlipidemia, unspecified: Secondary | ICD-10-CM

## 2013-02-04 DIAGNOSIS — E1129 Type 2 diabetes mellitus with other diabetic kidney complication: Secondary | ICD-10-CM

## 2013-02-04 DIAGNOSIS — Z7901 Long term (current) use of anticoagulants: Secondary | ICD-10-CM

## 2013-02-04 DIAGNOSIS — M109 Gout, unspecified: Secondary | ICD-10-CM

## 2013-02-04 DIAGNOSIS — B029 Zoster without complications: Secondary | ICD-10-CM

## 2013-02-04 DIAGNOSIS — R569 Unspecified convulsions: Secondary | ICD-10-CM

## 2013-02-08 ENCOUNTER — Encounter: Payer: Self-pay | Admitting: Adult Health

## 2013-02-08 DIAGNOSIS — M109 Gout, unspecified: Secondary | ICD-10-CM | POA: Insufficient documentation

## 2013-02-08 DIAGNOSIS — N186 End stage renal disease: Secondary | ICD-10-CM | POA: Insufficient documentation

## 2013-02-08 DIAGNOSIS — K219 Gastro-esophageal reflux disease without esophagitis: Secondary | ICD-10-CM | POA: Insufficient documentation

## 2013-02-08 DIAGNOSIS — E1122 Type 2 diabetes mellitus with diabetic chronic kidney disease: Secondary | ICD-10-CM | POA: Insufficient documentation

## 2013-02-08 NOTE — Assessment & Plan Note (Signed)
Will continue zocor 10 mg daily  

## 2013-02-08 NOTE — Assessment & Plan Note (Addendum)
Her shingles rash is resolving. She is on valtrex 1 gm daily for total of 14 days. She is on ultram 50 mg every 8 hours for pain. Will not make changes will monitor her status

## 2013-02-08 NOTE — Assessment & Plan Note (Signed)
Will continue her hemodialysis three times weekly will continue to be followed by nephrology. Will continue rena vite daily; aranesp 200 mg weekly at dialysis and will continue to monitor her status

## 2013-02-08 NOTE — Assessment & Plan Note (Signed)
No reports of seizure activity present; will continue dilantin 300 mg nightly and will monitor

## 2013-02-08 NOTE — Assessment & Plan Note (Signed)
Is stable will continue long term coumadin therapy

## 2013-02-08 NOTE — Assessment & Plan Note (Signed)
Will continue allopurinol 150 mg daily and will monitor

## 2013-02-08 NOTE — Progress Notes (Signed)
Patient ID: [name], female   DOB: [birth year], 79 y.o.   MRN: [id number]  [facility]  No Known Allergies   Chief Complaint  Patient presents with  . Medical Managment of Chronic Issues    HPI: She is being seen for the management of her chronic illnesses. She is presently being treated for shingles on the right side of her face. The rash is resolving. She is not complaining of pain present. She states her appetite is poor as she states the shingles is taking a lot out of her.   Past Medical History  Diagnosis Date  . Hypertension   . Venous stasis of lower extremity WEARS TEDS    BLE--  CURRENTLY NO ULCERS/ WOUNDS  . Aortic insufficiency     mild/H&P 04/02/2012  . Type II diabetes mellitus   . OSA on CPAP   . Pulmonary hypertension per last echo 03-28-2012  mod.-severe     per dr wert note this related to L ht pressures from diastolic chf  . Hypertrophic obstructive cardiomyopathy(425.11)   . Arthritis   . CKD (chronic kidney disease) stage 3, GFR 30-59 ml/min   . Heart murmur   . History of DVT of lower extremity lower right leg nov 2012  . Hyperlipidemia   . History of gout 06-09-2012  per pt stable  . Chronic diastolic CHF (congestive heart failure), NYHA class 3 CARDIOLOGIST- DR HILTY--  LOV OCT 2013--  REQUESTED NOTE, EGK AND STRESS TEST    PER PT ON 06-09-2012  S&S OF CHF AND WT IS DOWN  . H/O hiatal hernia   . GERD (gastroesophageal reflux disease)   . Lower extremity edema   . PAF (paroxysmal atrial fibrillation), in SR on admit 09/25/2012  . Stroke Jan 2014    Rt brain, Nl CA doppler  . TIA (transient ischemic attack) 08/07/2012    2D Echo - EF 60-65, severe concentric hypertrophy in the left ventricle  . Dyspnea on exertion 11/30/2010    Gated Stress Dipyridamole Myocardial Perfusion - post stress EF-63, normal study, no ischemia demonstrated   . Hematoma, lt upper arm 01/23/2013    Past Surgical History  Procedure Laterality Date  . Total knee arthroplasty   1988; 1998 X2    right; bilateral  . Tumor excision  1970's    "off my stomach"  . Cataract extraction w/ intraocular lens implant  ~ 2010    right; "put lens in; didn't work; had mass on my eye"  . Transthoracic echocardiogram  04-03-2012   DR KENNETH HILTY    LVSF  EF 80-85%/ HYPERTRONIC CARDIOMYOPATHY WITH MOSTLY MID-CAVITARY OBSTRUCTION AND A SMALLER COMPONENT OF LV OUTFLOW TRACT OBSTRUCTION/   EVIDENCE OF SEVERE DECOMPRESATION OF DIASTOLIC HEART FAILURE AND MODERATE TO SEVERE PULMONARY ARTERIAL HYPERTENSION  . Joint replacement  '88 and '98    TOTAL RIGHT KNEE X2  1988  &  1998 (DUE TO JOINT MALFUNCTION)   TOTAL LEFT KNEE 1998  . Carpometacarpel suspension plasty  06/16/2012    Procedure: CARPOMETACARPEL [facility]) SUSPENSION PLASTY;  Surgeon: Jodi Marble, MD;  Location: [facility];  Service: Orthopedics;  Laterality: Right;  RIGHT THUMB SUSPENSION ARTHROPLASTY.  Marland Kitchen Eye surgery Right ~ 2011    "not a cataract; just a growth" (09/26/2012)  . Cardiac catheterization Bilateral 01/19/2004    NL cors. neg Nuc 2012  . Av fistula placement N/A 01/12/2013    Procedure: ARTERIOVENOUS (AV) FISTULA CREATION;  Surgeon: Larina Earthly, MD;  Location: [facility] OR;  Service: Vascular;  Laterality: N/A;  . Insertion of dialysis catheter N/A 01/12/2013    Procedure: INSERTION OF DIALYSIS CATHETER;  Surgeon: Larina Earthly, MD;  Location: [facility] OR;  Service: Vascular;  Laterality: N/A;    VITAL SIGNS BP 118/82  Pulse 80  Ht 5\' 3"  (1.6 m)  Wt 211 lb (95.709 kg)  BMI 37.39 kg/m2   Patient's Medications  New Prescriptions   No medications on file  Previous Medications   ACETAMINOPHEN (TYLENOL) 325 MG TABLET    Take 2 tablets (650 mg total) by mouth every 4 (four) hours as needed.   ALLOPURINOL (ZYLOPRIM) 300 MG TABLET    Take 150 mg by mouth daily.   ALPRAZOLAM (XANAX) 0.25 MG TABLET    Take 1 tablet (0.25 mg total) by mouth 2 (two) times daily as needed for anxiety.   DARBEPOETIN (ARANESP) 200  MCG/0.4ML SOLN    Inject 0.4 mLs (200 mcg total) into the vein every Thursday with hemodialysis.   FEEDING SUPPLEMENT (RESOURCE BREEZE) LIQD    Take 1 Container by mouth 2 (two) times daily between meals.   INSULIN ASPART PROTAMINE- ASPART (NOVOLOG MIX 70/30) (70-30) 100 UNIT/ML INJECTION    Inject 0.05 mLs (5 Units total) into the skin daily with supper.   INSULIN ASPART PROTAMINE- ASPART (NOVOLOG MIX 70/30) (70-30) 100 UNIT/ML INJECTION    Inject 0.1 mLs (10 Units total) into the skin daily with breakfast.   INSULIN LISPRO (HUMALOG) 100 UNIT/ML INJECTION    Inject 2-5 Units into the skin 3 (three) times daily before meals. Sliding scale   LOPERAMIDE (IMODIUM) 2 MG CAPSULE    Take 1 capsule (2 mg total) by mouth as needed for diarrhea or loose stools.   MULTIVITAMIN (RENA-VIT) TABS TABLET    Take 1 tablet by mouth at bedtime.   OMEPRAZOLE (PRILOSEC) 20 MG CAPSULE    Take 20 mg by mouth every morning.    OXYCODONE (OXY IR/ROXICODONE) 5 MG IMMEDIATE RELEASE TABLET    Take 1-2 tablets (5-10 mg total) by mouth every 2 (two) hours as needed.   PHENYTOIN (DILANTIN) 300 MG ER CAPSULE    Take 1 capsule (300 mg total) by mouth at bedtime.   SENNA-DOCUSATE (SENOKOT-S) 8.6-50 MG PER TABLET    Take 1 tablet by mouth at bedtime as needed for constipation.   SIMVASTATIN (ZOCOR) 10 MG TABLET    Take 10 mg by mouth at bedtime.   TRAMADOL (ULTRAM) 50 MG TABLET    Take 50 mg by mouth every 8 (eight) hours.   VALACYCLOVIR (VALTREX) 1000 MG TABLET    Take 1,000 mg by mouth daily.   ZOLPIDEM (AMBIEN) 5 MG TABLET    Take 1 tablet (5 mg total) by mouth at bedtime as needed for sleep.  Modified Medications   Modified Medication Previous Medication   WARFARIN (COUMADIN) 1 MG TABLET warfarin (COUMADIN) 1 MG tablet      Take 1.5 mg by mouth as directed.    Take 0.5-1 tablets (0.5-1 mg total) by mouth as directed.  Discontinued Medications   No medications on file    SIGNIFICANT DIAGNOSTIC EXAMS   LABS REVIEWED:    09-25-12: tsh 0.992 01-01-13: dilantin 3.8; free dilantin 0.9 01-12-13: pth 120.1; iron 67; tibc 243 01-23-13: wbc 11.3; hgb 11.1; hct 33.1 mcv 100.3; plt 88; glucose 116; bun 15; creat 1.56; k+3.4;na++133   Review of Systems  Constitutional: Positive for malaise/fatigue. Negative for fever.  Eyes: Positive for blurred  vision.  Respiratory: Negative for cough and shortness of breath.   Cardiovascular: Negative for chest pain and palpitations.  Gastrointestinal: Negative for heartburn, abdominal pain and constipation.  Musculoskeletal: Negative for myalgias and joint pain.  Skin: Positive for rash.       Her shingles rash on the right side of her face is resolving   Neurological: Negative for headaches.  Psychiatric/Behavioral: Negative for depression.   Physical Exam  Vitals reviewed. Constitutional: She is oriented to person, place, and time. She appears well-nourished.  obese  Eyes:  Right eye is slightly swollen and slightly red  Neck: Normal range of motion. Neck supple. No JVD present. No thyromegaly present.  Cardiovascular: Normal rate, regular rhythm and intact distal pulses.   Respiratory: Effort normal and breath sounds normal. No respiratory distress.  GI: Soft. Bowel sounds are normal. She exhibits no distension. There is no tenderness.  Musculoskeletal: She exhibits no edema.  Able to move extremities  Neurological: She is alert and oriented to person, place, and time.  Skin: Skin is warm and dry. She is not diaphoretic.  The right facial shingles rash is nearly resolved there are no signs of inflammation present  Psychiatric: She has a normal mood and affect.       ASSESSMENT/ PLAN:  Herpes zoster Her shingles rash is resolving. She is on valtrex 1 gm daily for total of 14 days. She is on ultram 50 mg every 8 hours for pain. Will not make changes will monitor her status   Hyperlipidemia Will continue zocor 10 mg daily   Chronic kidney disease (CKD), stage  IV (severe), now with HD Will continue her hemodialysis three times weekly will continue to be followed by nephrology. Will continue rena vite daily; aranesp 200 mg weekly at dialysis and will continue to monitor her status   PAF (paroxysmal atrial fibrillation),  Is stable will continue long term coumadin therapy  Seizure disorder on dilatin No reports of seizure activity present; will continue dilantin 300 mg nightly and will monitor  Gout Will continue allopurinol 150 mg daily and will monitor   Long term (current) use of anticoagulants For her inr of 2.3 will continue her coumadin of 1.5 mg daily and will check inr in one week  GERD (gastroesophageal reflux disease) Will continue prilosec 20 mg daily   Type II diabetes mellitus with end-stage renal disease She is stable will continue novolog mix 70/30 10 units in the am and 5 units in the pm; will continue novolog ssi with meals and will monitor     Time spent with patient 50 minutes.

## 2013-02-08 NOTE — Assessment & Plan Note (Signed)
She is stable will continue novolog mix 70/30 10 units in the am and 5 units in the pm; will continue novolog ssi with meals and will monitor

## 2013-02-08 NOTE — Assessment & Plan Note (Signed)
Will continue prilosec 20 mg daily  

## 2013-02-08 NOTE — Assessment & Plan Note (Signed)
For her inr of 2.3 will continue her coumadin of 1.5 mg daily and will check inr in one week

## 2013-02-09 ENCOUNTER — Ambulatory Visit: Payer: Medicare Other | Admitting: Internal Medicine

## 2013-02-10 ENCOUNTER — Encounter (HOSPITAL_COMMUNITY): Payer: Self-pay

## 2013-02-10 ENCOUNTER — Inpatient Hospital Stay (HOSPITAL_COMMUNITY)
Admission: EM | Admit: 2013-02-10 | Discharge: 2013-02-12 | DRG: 124 | Disposition: A | Discharge status: Skilled Nursing Facility | Payer: Medicare Other | Attending: Internal Medicine | Admitting: Internal Medicine

## 2013-02-10 ENCOUNTER — Emergency Department (HOSPITAL_COMMUNITY): Payer: Medicare Other

## 2013-02-10 DIAGNOSIS — I509 Heart failure, unspecified: Secondary | ICD-10-CM | POA: Diagnosis present

## 2013-02-10 DIAGNOSIS — M109 Gout, unspecified: Secondary | ICD-10-CM | POA: Diagnosis present

## 2013-02-10 DIAGNOSIS — G4733 Obstructive sleep apnea (adult) (pediatric): Secondary | ICD-10-CM | POA: Diagnosis present

## 2013-02-10 DIAGNOSIS — D649 Anemia, unspecified: Secondary | ICD-10-CM | POA: Diagnosis present

## 2013-02-10 DIAGNOSIS — I359 Nonrheumatic aortic valve disorder, unspecified: Secondary | ICD-10-CM | POA: Diagnosis present

## 2013-02-10 DIAGNOSIS — R569 Unspecified convulsions: Secondary | ICD-10-CM | POA: Diagnosis present

## 2013-02-10 DIAGNOSIS — I421 Obstructive hypertrophic cardiomyopathy: Secondary | ICD-10-CM | POA: Diagnosis present

## 2013-02-10 DIAGNOSIS — I872 Venous insufficiency (chronic) (peripheral): Secondary | ICD-10-CM | POA: Diagnosis present

## 2013-02-10 DIAGNOSIS — I2789 Other specified pulmonary heart diseases: Secondary | ICD-10-CM | POA: Diagnosis present

## 2013-02-10 DIAGNOSIS — Z7901 Long term (current) use of anticoagulants: Secondary | ICD-10-CM

## 2013-02-10 DIAGNOSIS — N186 End stage renal disease: Secondary | ICD-10-CM | POA: Diagnosis present

## 2013-02-10 DIAGNOSIS — N2581 Secondary hyperparathyroidism of renal origin: Secondary | ICD-10-CM | POA: Diagnosis present

## 2013-02-10 DIAGNOSIS — M129 Arthropathy, unspecified: Secondary | ICD-10-CM | POA: Diagnosis present

## 2013-02-10 DIAGNOSIS — E1129 Type 2 diabetes mellitus with other diabetic kidney complication: Secondary | ICD-10-CM | POA: Diagnosis present

## 2013-02-10 DIAGNOSIS — E785 Hyperlipidemia, unspecified: Secondary | ICD-10-CM | POA: Diagnosis present

## 2013-02-10 DIAGNOSIS — B0239 Other herpes zoster eye disease: Secondary | ICD-10-CM | POA: Diagnosis present

## 2013-02-10 DIAGNOSIS — I480 Paroxysmal atrial fibrillation: Secondary | ICD-10-CM | POA: Diagnosis present

## 2013-02-10 DIAGNOSIS — Z79899 Other long term (current) drug therapy: Secondary | ICD-10-CM

## 2013-02-10 DIAGNOSIS — G40909 Epilepsy, unspecified, not intractable, without status epilepticus: Secondary | ICD-10-CM | POA: Diagnosis present

## 2013-02-10 DIAGNOSIS — I5189 Other ill-defined heart diseases: Secondary | ICD-10-CM | POA: Diagnosis present

## 2013-02-10 DIAGNOSIS — Z86718 Personal history of other venous thrombosis and embolism: Secondary | ICD-10-CM

## 2013-02-10 DIAGNOSIS — N184 Chronic kidney disease, stage 4 (severe): Secondary | ICD-10-CM | POA: Diagnosis present

## 2013-02-10 DIAGNOSIS — Z418 Encounter for other procedures for purposes other than remedying health state: Secondary | ICD-10-CM

## 2013-02-10 DIAGNOSIS — L97909 Non-pressure chronic ulcer of unspecified part of unspecified lower leg with unspecified severity: Secondary | ICD-10-CM | POA: Diagnosis present

## 2013-02-10 DIAGNOSIS — Z96659 Presence of unspecified artificial knee joint: Secondary | ICD-10-CM

## 2013-02-10 DIAGNOSIS — I639 Cerebral infarction, unspecified: Secondary | ICD-10-CM

## 2013-02-10 DIAGNOSIS — L0889 Other specified local infections of the skin and subcutaneous tissue: Secondary | ICD-10-CM | POA: Diagnosis present

## 2013-02-10 DIAGNOSIS — B9689 Other specified bacterial agents as the cause of diseases classified elsewhere: Secondary | ICD-10-CM | POA: Diagnosis present

## 2013-02-10 DIAGNOSIS — B0233 Zoster keratitis: Principal | ICD-10-CM | POA: Diagnosis present

## 2013-02-10 DIAGNOSIS — A499 Bacterial infection, unspecified: Secondary | ICD-10-CM | POA: Diagnosis present

## 2013-02-10 DIAGNOSIS — G4730 Sleep apnea, unspecified: Secondary | ICD-10-CM | POA: Diagnosis present

## 2013-02-10 DIAGNOSIS — I120 Hypertensive chronic kidney disease with stage 5 chronic kidney disease or end stage renal disease: Secondary | ICD-10-CM | POA: Diagnosis present

## 2013-02-10 DIAGNOSIS — Z992 Dependence on renal dialysis: Secondary | ICD-10-CM

## 2013-02-10 DIAGNOSIS — B029 Zoster without complications: Secondary | ICD-10-CM | POA: Diagnosis present

## 2013-02-10 DIAGNOSIS — Z113 Encounter for screening for infections with a predominantly sexual mode of transmission: Secondary | ICD-10-CM

## 2013-02-10 DIAGNOSIS — Z8673 Personal history of transient ischemic attack (TIA), and cerebral infarction without residual deficits: Secondary | ICD-10-CM

## 2013-02-10 DIAGNOSIS — L0390 Cellulitis, unspecified: Secondary | ICD-10-CM

## 2013-02-10 DIAGNOSIS — I10 Essential (primary) hypertension: Secondary | ICD-10-CM | POA: Diagnosis present

## 2013-02-10 DIAGNOSIS — I6350 Cerebral infarction due to unspecified occlusion or stenosis of unspecified cerebral artery: Secondary | ICD-10-CM

## 2013-02-10 DIAGNOSIS — E1122 Type 2 diabetes mellitus with diabetic chronic kidney disease: Secondary | ICD-10-CM | POA: Diagnosis present

## 2013-02-10 DIAGNOSIS — Z2989 Encounter for other specified prophylactic measures: Secondary | ICD-10-CM

## 2013-02-10 DIAGNOSIS — I5032 Chronic diastolic (congestive) heart failure: Secondary | ICD-10-CM | POA: Diagnosis present

## 2013-02-10 DIAGNOSIS — I4891 Unspecified atrial fibrillation: Secondary | ICD-10-CM | POA: Diagnosis present

## 2013-02-10 DIAGNOSIS — Z794 Long term (current) use of insulin: Secondary | ICD-10-CM

## 2013-02-10 DIAGNOSIS — K219 Gastro-esophageal reflux disease without esophagitis: Secondary | ICD-10-CM | POA: Diagnosis present

## 2013-02-10 LAB — CBC
HCT: 39.4 % (ref 36.0–46.0)
Hemoglobin: 12.7 g/dL (ref 12.0–15.0)
MCHC: 32.2 g/dL (ref 30.0–36.0)
RBC: 3.78 MIL/uL — ABNORMAL LOW (ref 3.87–5.11)
WBC: 10.5 10*3/uL (ref 4.0–10.5)

## 2013-02-10 LAB — POCT I-STAT, CHEM 8
BUN: 42 mg/dL — ABNORMAL HIGH (ref 6–23)
Chloride: 99 mEq/L (ref 96–112)
Sodium: 134 mEq/L — ABNORMAL LOW (ref 135–145)
TCO2: 27 mmol/L (ref 0–100)

## 2013-02-10 LAB — GLUCOSE, CAPILLARY: Glucose-Capillary: 79 mg/dL (ref 70–99)

## 2013-02-10 LAB — PROTIME-INR: INR: 3.12 — ABNORMAL HIGH (ref 0.00–1.49)

## 2013-02-10 MED ORDER — VALACYCLOVIR HCL 500 MG PO TABS
1000.0000 mg | ORAL_TABLET | Freq: Two times a day (BID) | ORAL | Status: DC
  Administered 2013-02-11: 1000 mg via ORAL
  Filled 2013-02-10 (×3): qty 2

## 2013-02-10 MED ORDER — INSULIN ASPART 100 UNIT/ML ~~LOC~~ SOLN: 0.0000 [IU] | Freq: Every day | SUBCUTANEOUS | Status: DC

## 2013-02-10 MED ORDER — SODIUM CHLORIDE 0.9 % IJ SOLN: 3.0000 mL | Freq: Two times a day (BID) | INTRAMUSCULAR | Status: DC

## 2013-02-10 MED ORDER — WARFARIN SODIUM 1 MG PO TABS
1.5000 mg | ORAL_TABLET | Freq: Every day | ORAL | Status: DC
  Filled 2013-02-10: qty 1

## 2013-02-10 MED ORDER — INSULIN ASPART PROT & ASPART (70-30 MIX) 100 UNIT/ML ~~LOC~~ SUSP
5.0000 [IU] | Freq: Two times a day (BID) | SUBCUTANEOUS | Status: DC
  Filled 2013-02-10 (×2): qty 10

## 2013-02-10 MED ORDER — TRAMADOL HCL 50 MG PO TABS
50.0000 mg | ORAL_TABLET | Freq: Three times a day (TID) | ORAL | Status: DC
  Administered 2013-02-11 (×2): 50 mg via ORAL
  Filled 2013-02-10 (×2): qty 1

## 2013-02-10 MED ORDER — VANCOMYCIN HCL IN DEXTROSE 750-5 MG/150ML-% IV SOLN
750.0000 mg | INTRAVENOUS | Status: DC
  Administered 2013-02-12: 750 mg via INTRAVENOUS
  Filled 2013-02-10 (×2): qty 150

## 2013-02-10 MED ORDER — ALPRAZOLAM 0.25 MG PO TABS: 0.2500 mg | ORAL_TABLET | Freq: Two times a day (BID) | ORAL | Status: DC | PRN

## 2013-02-10 MED ORDER — OXYCODONE HCL 5 MG PO TABS: 5.0000 mg | ORAL_TABLET | ORAL | Status: DC | PRN

## 2013-02-10 MED ORDER — FLUORESCEIN SODIUM 1 MG OP STRP
ORAL_STRIP | OPHTHALMIC | Status: AC
  Filled 2013-02-10: qty 1

## 2013-02-10 MED ORDER — ZOLPIDEM TARTRATE 5 MG PO TABS
5.0000 mg | ORAL_TABLET | Freq: Every evening | ORAL | Status: DC | PRN
  Administered 2013-02-11: 5 mg via ORAL
  Filled 2013-02-10: qty 1

## 2013-02-10 MED ORDER — SIMVASTATIN 10 MG PO TABS
10.0000 mg | ORAL_TABLET | Freq: Every day | ORAL | Status: DC
  Administered 2013-02-11: 10 mg via ORAL
  Filled 2013-02-10 (×3): qty 1

## 2013-02-10 MED ORDER — WARFARIN - PHYSICIAN DOSING INPATIENT: Freq: Every day | Status: DC

## 2013-02-10 MED ORDER — SENNOSIDES-DOCUSATE SODIUM 8.6-50 MG PO TABS
1.0000 | ORAL_TABLET | Freq: Every evening | ORAL | Status: DC | PRN
  Filled 2013-02-10: qty 1

## 2013-02-10 MED ORDER — ACETAMINOPHEN 325 MG PO TABS: 650.0000 mg | ORAL_TABLET | ORAL | Status: DC | PRN

## 2013-02-10 MED ORDER — ALLOPURINOL 150 MG HALF TABLET
150.0000 mg | ORAL_TABLET | Freq: Every day | ORAL | Status: DC
  Administered 2013-02-11 – 2013-02-12 (×2): 150 mg via ORAL
  Filled 2013-02-10 (×2): qty 1

## 2013-02-10 MED ORDER — VANCOMYCIN HCL 10 G IV SOLR
1500.0000 mg | Freq: Once | INTRAVENOUS | Status: AC
  Administered 2013-02-11: 1500 mg via INTRAVENOUS
  Filled 2013-02-10: qty 1500

## 2013-02-10 MED ORDER — PHENYTOIN SODIUM EXTENDED 100 MG PO CAPS
300.0000 mg | ORAL_CAPSULE | Freq: Every day | ORAL | Status: DC
  Administered 2013-02-11 (×2): 300 mg via ORAL
  Filled 2013-02-10 (×3): qty 3

## 2013-02-10 MED ORDER — INSULIN ASPART 100 UNIT/ML ~~LOC~~ SOLN: 0.0000 [IU] | Freq: Three times a day (TID) | SUBCUTANEOUS | Status: DC

## 2013-02-10 MED ORDER — SODIUM CHLORIDE (HYPERTONIC) 2 % OP SOLN
1.0000 [drp] | Freq: Three times a day (TID) | OPHTHALMIC | Status: DC
  Filled 2013-02-10: qty 15

## 2013-02-10 NOTE — ED Provider Notes (Addendum)
Patient signed out to me followup of blood work and arrange for admission patient. Patient was seen or significant zoster of the right side of her face with bacterial superinfection. There is significant delay in arranging for the patient's blood work as IV access was difficult. I did perform ultrasound-guided IV which he was able to draw blood for testing, but I could not get it to flow and it was therefore removed.  Case discussed with the hospitalist, Doctor Allena Katz. She will be admitted to telemetry for dialysis. It had already been arranged that the patient would get antibiotics with dialysis.  Angiocath insertion Performed by: Gilda Crease  Consent: Verbal consent obtained. Risks and benefits: risks, benefits and alternatives were discussed Time out: Immediately prior to procedure a "time out" was called to verify the correct patient, procedure, equipment, support staff and site/side marked as required.  Preparation: Patient was prepped and draped in the usual sterile fashion.  Vein Location: right antecubital  Ultrasound Guided  Gauge: 20  Normal blood return but would not flush - removed. Patient tolerance: Patient tolerated the procedure well with no immediate complications.      Gilda Crease, MD 02/10/13 2016  Gilda Crease, MD 02/10/13 2017

## 2013-02-10 NOTE — ED Notes (Addendum)
Bed Control called because pt is respiratory isolation (no bed available per charge nurse on 6E).  Bed control will reassign.

## 2013-02-10 NOTE — Consult Note (Signed)
[facility] Renal Consultation Note  Indication for Consultation:  Management of ESRD/hemodialysis; anemia, hypertension/volume and secondary hyperparathyroidism  HPI: [name] is a 79 y.o. female with ESRD who has received dialysis on TTS at the [facility] since starting during hospitalization 6/17-7/11 and her subsequent discharge to [facility], where she recently began treatment for Herpes Zoster of the right side of the face.   The lesions were first noticed on 7/19, and she was started on Valcyclovir and prednisone on 7/20.  However, when she presented to the dialysis center today for her scheduled treatment, she was found to have pustulant, oozing wounds, prohibiting her from having outpatient dialysis at the facility, according to policy.  She was transferred to the hospital where she will be admitted for dialysis while her wounds heal.  She denies any pain and currently has no complaints.  Past Medical History  Diagnosis Date  . Hypertension   . Venous stasis of lower extremity WEARS TEDS    BLE--  CURRENTLY NO ULCERS/ WOUNDS  . Aortic insufficiency     mild/H&P 04/02/2012  . Type II diabetes mellitus   . OSA on CPAP   . Pulmonary hypertension per last echo 03-28-2012  mod.-severe     per dr wert note this related to L ht pressures from diastolic chf  . Hypertrophic obstructive cardiomyopathy(425.11)   . Arthritis   . CKD (chronic kidney disease) stage 3, GFR 30-59 ml/min   . Heart murmur   . History of DVT of lower extremity lower right leg nov 2012  . Hyperlipidemia   . History of gout 06-09-2012  per pt stable  . Chronic diastolic CHF (congestive heart failure), NYHA class 3 CARDIOLOGIST- DR HILTY--  LOV OCT 2013--  REQUESTED NOTE, EGK AND STRESS TEST    PER PT ON 06-09-2012  S&S OF CHF AND WT IS DOWN  . H/O hiatal hernia   . GERD (gastroesophageal reflux disease)   . Lower extremity edema   . PAF (paroxysmal atrial fibrillation), in SR  on admit 09/25/2012  . Stroke Jan 2014    Rt brain, Nl CA doppler  . TIA (transient ischemic attack) 08/07/2012    2D Echo - EF 60-65, severe concentric hypertrophy in the left ventricle  . Dyspnea on exertion 11/30/2010    Gated Stress Dipyridamole Myocardial Perfusion - post stress EF-63, normal study, no ischemia demonstrated   . Hematoma, lt upper arm 01/23/2013   Past Surgical History  Procedure Laterality Date  . Total knee arthroplasty  1988; 1998 X2    right; bilateral  . Tumor excision  1970's    "off my stomach"  . Cataract extraction w/ intraocular lens implant  ~ 2010    right; "put lens in; didn't work; had mass on my eye"  . Transthoracic echocardiogram  04-03-2012   DR KENNETH HILTY    LVSF  EF 80-85%/ HYPERTRONIC CARDIOMYOPATHY WITH MOSTLY MID-CAVITARY OBSTRUCTION AND A SMALLER COMPONENT OF LV OUTFLOW TRACT OBSTRUCTION/   EVIDENCE OF SEVERE DECOMPRESATION OF DIASTOLIC HEART FAILURE AND MODERATE TO SEVERE PULMONARY ARTERIAL HYPERTENSION  . Joint replacement  '88 and '98    TOTAL RIGHT KNEE X2  1988  &  1998 (DUE TO JOINT MALFUNCTION)   TOTAL LEFT KNEE 1998  . Carpometacarpel suspension plasty  06/16/2012    Procedure: CARPOMETACARPEL [facility]) SUSPENSION PLASTY;  Surgeon: Jodi Marble, MD;  Location: [facility];  Service: Orthopedics;  Laterality: Right;  RIGHT THUMB SUSPENSION ARTHROPLASTY.  Marland Kitchen  Eye surgery Right ~ 2011    "not a cataract; just a growth" (09/26/2012)  . Cardiac catheterization Bilateral 01/19/2004    NL cors. neg Nuc 2012  . Av fistula placement N/A 01/12/2013    Procedure: ARTERIOVENOUS (AV) FISTULA CREATION;  Surgeon: Larina Earthly, MD;  Location: [facility] OR;  Service: Vascular;  Laterality: N/A;  . Insertion of dialysis catheter N/A 01/12/2013    Procedure: INSERTION OF DIALYSIS CATHETER;  Surgeon: Larina Earthly, MD;  Location: [facility] OR;  Service: Vascular;  Laterality: N/A;   Family History  Problem Relation Age of Onset  . Other Mother   . Coronary  artery disease Father     sudden death in his 48's  . Diabetes Father   . Cancer Father   . Diabetes Sister    Social History She denies any history of tobacco, alcohol, or illicit drug use.  She previously worked for [employer] and lived with her family before her recent hospitalization.  No Known Allergies Prior to Admission medications   Medication Sig Start Date End Date Taking? Authorizing Provider  allopurinol (ZYLOPRIM) 300 MG tablet Take 150 mg by mouth daily.   Yes Historical Provider, MD  ALPRAZolam (XANAX) 0.25 MG tablet Take 1 tablet (0.25 mg total) by mouth 2 (two) times daily as needed for anxiety. 01/23/13  Yes Nada Boozer, NP  feeding supplement (RESOURCE BREEZE) LIQD Take 1 Container by mouth 2 (two) times daily between meals. 01/23/13  Yes Nada Boozer, NP  insulin aspart protamine- aspart (NOVOLOG MIX 70/30) (70-30) 100 UNIT/ML injection Inject 5-10 Units into the skin 2 (two) times daily with a meal. Inject 10 units with breakfast and 5 units with supper   Yes Historical Provider, MD  oxyCODONE (OXY IR/ROXICODONE) 5 MG immediate release tablet Take 5-10 mg by mouth every 2 (two) hours as needed for pain.   Yes Historical Provider, MD  phenytoin (DILANTIN) 100 MG ER capsule Take 300 mg by mouth at bedtime.   Yes Historical Provider, MD  darbepoetin (ARANESP) 200 MCG/0.4ML SOLN Inject 0.4 mLs (200 mcg total) into the vein every Thursday with hemodialysis. 01/23/13   Nada Boozer, NP  insulin lispro (HUMALOG) 100 UNIT/ML injection Inject 2-5 Units into the skin 3 (three) times daily before meals. Sliding scale    Historical Provider, MD  loperamide (IMODIUM) 2 MG capsule Take 1 capsule (2 mg total) by mouth as needed for diarrhea or loose stools. 01/23/13   Nada Boozer, NP  multivitamin (RENA-VIT) TABS tablet Take 1 tablet by mouth at bedtime. 01/23/13   Nada Boozer, NP  omeprazole (PRILOSEC) 20 MG capsule Take 20 mg by mouth every morning.     Historical Provider, MD   phenytoin (DILANTIN) 300 MG ER capsule Take 1 capsule (300 mg total) by mouth at bedtime. 12/07/12   Henderson Cloud, MD  senna-docusate (SENOKOT-S) 8.6-50 MG per tablet Take 1 tablet by mouth at bedtime as needed for constipation.    Historical Provider, MD  simvastatin (ZOCOR) 10 MG tablet Take 10 mg by mouth at bedtime.    Historical Provider, MD  traMADol (ULTRAM) 50 MG tablet Take 50 mg by mouth every 8 (eight) hours.    Historical Provider, MD  valACYclovir (VALTREX) 1000 MG tablet Take 1,000 mg by mouth daily. 02/03/13 02/22/13  Historical Provider, MD  warfarin (COUMADIN) 1 MG tablet Take 1.5 mg by mouth as directed. 01/23/13   Nada Boozer, NP  zolpidem (AMBIEN) 5 MG tablet Take 1 tablet (5  mg total) by mouth at bedtime as needed for sleep. 01/23/13   Nada Boozer, NP   Labs: No results found for this or any previous visit (from the past 48 hour(s)).  Constitutional: negative for chills, fatigue, fevers and sweats Ears, nose, mouth, throat, and face:  negative for earaches, hoarseness, nasal congestion and sore throat Respiratory: negative for cough, dyspnea on exertion, hemoptysis and sputum Cardiovascular: negative for chest pain, chest pressure/discomfort, dyspnea, orthopnea and palpitations Gastrointestinal: negative for abdominal pain, change in bowel habits, nausea and vomiting Genitourinary:negative, oliguric Musculoskeletal:negative for arthralgias, back pain, myalgias and neck pain Neurological: negative for dizziness, headaches, paresthesia and speech problems  Physical Exam: Filed Vitals:   02/10/13 1304  BP: 103/56  Temp: 97.6 F (36.4 C)  Resp: 14     General appearance: alert, cooperative and no distress Head: pustulant rash with draining wounds over right side of face Neck: no adenopathy, no carotid bruit, no JVD and supple, symmetrical, trachea midline Resp: clear to auscultation bilaterally Cardio: RRR with Gr II/VI systolic murmur loudest at base GI:  soft, non-tender; bowel sounds normal; liver down 4 cm, no masses Extremities: Lower legs wrapped in dressings and tape, no edema, R knee vertical scar, no gangrene or ulcers Neurologic: Grossly normal Dialysis Access: R IJ catheter, maturing AVF @ LUA with + bruit   Dialysis Orders:   TTS @ [place] 4 hrs   92 kg   3K/2.5Ca    400/800   Heparin 2700   R IJ catheter   AVF @ LUA (placed 6/30 by Dr. Arbie Cookey) Hectorol 0     Epogen 0    Venofer 50 mg qwk   Assessment/Plan: 1. Herpes Zoster - open, oozing wounds over right side of face. Needs inpatient HD til ID clears pt to go back to outpatient; may need abx for superinfection, defer to primary 2. ESRD - HD on TTS @ [place], on 3K bath.  K pending, HD tomorrow then resume TTS  3. Hypertension/volume  - BP controlled (122/45), no meds; chest x-ray clear, small gains between HD, maintaining EDW . 4. Anemia  - Last Hgb 12.9, on Epogen 20,000 U until 7/24; last T-sat 33% s/p  Venofer series, now qwk. 5. Metabolic bone disease - Last Ca 8.9, P 3.4, iPTH 81 on 7/15; no Hectorol or binders. 6. Nutrition - Last Alb 2.9 on 7/15, renal diet, vitamin. 7. DM - on insulin. 8. Seizure disorder - on Dilantin. 9. PAF - on Coumadin. 10. Chronic venous insufficiency - with LE ulcers, followed by wound care.  LYLES,CHARLES 02/10/2013, 3:59 PM   Attending Nephrologist: Delano Metz, MD  Patient seen and examined.  I agree with plan as above with additions as indicated. Vinson Moselle  MD Pager [phone]    Cell  [phone] 02/10/2013, 5:24 PM

## 2013-02-10 NOTE — Progress Notes (Signed)
ANTIBIOTIC CONSULT NOTE - INITIAL  Pharmacy Consult for vancomycin Indication: cellulitis 2ndary to zoster infection  No Known Allergies  Patient Measurements: Height: 5\' 6"  (167.6 cm) Weight: 196 lb 10.4 oz (89.2 kg) IBW/kg (Calculated) : 59.3 Adjusted Body Weight:   Vital Signs: Temp: 97.4 F (36.3 C) (07/29 2311) Temp src: Oral (07/29 2311) BP: 126/61 mmHg (07/29 2311) Pulse Rate: 88 (07/29 2311) Intake/Output from previous day:   Intake/Output from this shift:    Labs:  Recent Labs  02/10/13 1818 02/10/13 1840  WBC  --  10.5  HGB 15.6* 12.7  PLT  --  326  CREATININE 2.50*  --    Estimated Creatinine Clearance: 21.2 ml/min (by C-G formula based on Cr of 2.5). No results found for this basename: VANCOTROUGH, VANCOPEAK, VANCORANDOM, GENTTROUGH, GENTPEAK, GENTRANDOM, TOBRATROUGH, TOBRAPEAK, TOBRARND, AMIKACINPEAK, AMIKACINTROU, AMIKACIN,  in the last 72 hours   Microbiology: Recent Results (from the past 720 hour(s))  CULTURE, BLOOD (ROUTINE X 2)     Status: None   Collection Time    01/14/13  9:25 AM      Result Value Range Status   Specimen Description BLOOD RIGHT ARM   Final   Special Requests BOTTLES DRAWN AEROBIC ONLY 10CC   Final   Culture  Setup Time 01/14/2013 15:06   Final   Culture NO GROWTH 5 DAYS   Final   Report Status 01/20/2013 FINAL   Final  CULTURE, BLOOD (ROUTINE X 2)     Status: None   Collection Time    01/14/13  9:30 AM      Result Value Range Status   Specimen Description BLOOD RIGHT ARM   Final   Special Requests BOTTLES DRAWN AEROBIC ONLY 10CC   Final   Culture  Setup Time 01/14/2013 15:06   Final   Culture NO GROWTH 5 DAYS   Final   Report Status 01/20/2013 FINAL   Final  URINE CULTURE     Status: None   Collection Time    01/14/13  9:33 PM      Result Value Range Status   Specimen Description URINE, CLEAN CATCH   Final   Special Requests NONE   Final   Culture  Setup Time 01/14/2013 23:28   Final   Colony Count 40,000  COLONIES/ML   Final   Culture YEAST   Final   Report Status 01/16/2013 FINAL   Final  CULTURE, BLOOD (ROUTINE X 2)     Status: None   Collection Time    01/15/13 12:30 PM      Result Value Range Status   Specimen Description BLOOD HEMODIALYSIS CATHETER   Final   Special Requests BOTTLES DRAWN AEROBIC AND ANAEROBIC 10CC   Final   Culture  Setup Time 01/15/2013 16:56   Final   Culture NO GROWTH 5 DAYS   Final   Report Status 01/21/2013 FINAL   Final  CULTURE, BLOOD (ROUTINE X 2)     Status: None   Collection Time    01/15/13  1:00 PM      Result Value Range Status   Specimen Description BLOOD HEMODIALYSIS CATHETER   Final   Special Requests BOTTLES DRAWN AEROBIC AND ANAEROBIC 10CC   Final   Culture  Setup Time 01/15/2013 16:56   Final   Culture NO GROWTH 5 DAYS   Final   Report Status 01/21/2013 FINAL   Final    Medical History: Past Medical History  Diagnosis Date  . Hypertension   . Venous  stasis of lower extremity WEARS TEDS    BLE--  CURRENTLY NO ULCERS/ WOUNDS  . Aortic insufficiency     mild/H&P 04/02/2012  . Type II diabetes mellitus   . Pulmonary hypertension per last echo 03-28-2012  mod.-severe     per dr wert note this related to L ht pressures from diastolic chf  . Hypertrophic obstructive cardiomyopathy(425.11)   . Arthritis   . CKD (chronic kidney disease) stage 3, GFR 30-59 ml/min   . Heart murmur   . History of DVT of lower extremity lower right leg nov 2012  . Hyperlipidemia   . History of gout 06-09-2012  per pt stable  . Chronic diastolic CHF (congestive heart failure), NYHA class 3 CARDIOLOGIST- DR HILTY--  LOV OCT 2013--  REQUESTED NOTE, EGK AND STRESS TEST    PER PT ON 06-09-2012  S&S OF CHF AND WT IS DOWN  . H/O hiatal hernia   . GERD (gastroesophageal reflux disease)   . Lower extremity edema   . PAF (paroxysmal atrial fibrillation), in SR on admit 09/25/2012  . TIA (transient ischemic attack) 08/07/2012    2D Echo - EF 60-65, severe concentric  hypertrophy in the left ventricle  . Dyspnea on exertion 11/30/2010    Gated Stress Dipyridamole Myocardial Perfusion - post stress EF-63, normal study, no ischemia demonstrated   . Hematoma, lt upper arm 01/23/2013  . OSA on CPAP   . Stroke Jan 2014    Rt brain, Nl CA doppler    Medications:  Prescriptions prior to admission  Medication Sig Dispense Refill  . acetaminophen (TYLENOL) 325 MG tablet Take 650 mg by mouth every 4 (four) hours as needed for pain.      Marland Kitchen allopurinol (ZYLOPRIM) 300 MG tablet Take 150 mg by mouth daily.      Marland Kitchen ALPRAZolam (XANAX) 0.25 MG tablet Take 1 tablet (0.25 mg total) by mouth 2 (two) times daily as needed for anxiety.  30 tablet  0  . Emollient (HYDROPHOR) OINT Apply 1 application topically See admin instructions. Apply thin layer after washing face until shingles resolved      . feeding supplement (PRO-STAT SUGAR FREE 64) LIQD Take 30 mLs by mouth 2 (two) times daily.      . feeding supplement (RESOURCE BREEZE) LIQD Take 1 Container by mouth 2 (two) times daily between meals.    0  . insulin aspart protamine- aspart (NOVOLOG MIX 70/30) (70-30) 100 UNIT/ML injection Inject 5-10 Units into the skin 2 (two) times daily with a meal. Inject 10 units with breakfast and 5 units with supper      . Multiple Vitamins-Minerals (CERTA PLUS) TABS Take 1 tablet by mouth at bedtime.      Marland Kitchen nystatin cream (MYCOSTATIN) Apply 1 application topically 2 (two) times daily.      Marland Kitchen oxyCODONE (OXY IR/ROXICODONE) 5 MG immediate release tablet Take 5-10 mg by mouth every 2 (two) hours as needed for pain.      . phenytoin (DILANTIN) 100 MG ER capsule Take 300 mg by mouth at bedtime.      . sodium chloride (MURO 128) 2 % ophthalmic solution Place 1 drop into both eyes 3 (three) times daily.      Marland Kitchen warfarin (JANTOVEN) 3 MG tablet Take 1.5 mg by mouth daily.      . darbepoetin (ARANESP) 200 MCG/0.4ML SOLN Inject 0.4 mLs (200 mcg total) into the vein every Thursday with hemodialysis.  1.68  mL    . insulin lispro (  HUMALOG) 100 UNIT/ML injection Inject 2-5 Units into the skin 3 (three) times daily before meals. Sliding scale      . loperamide (IMODIUM) 2 MG capsule Take 1 capsule (2 mg total) by mouth as needed for diarrhea or loose stools.  30 capsule  0  . senna-docusate (SENOKOT-S) 8.6-50 MG per tablet Take 1 tablet by mouth at bedtime as needed for constipation.      . simvastatin (ZOCOR) 10 MG tablet Take 10 mg by mouth at bedtime.      . traMADol (ULTRAM) 50 MG tablet Take 50 mg by mouth every 8 (eight) hours.      . valACYclovir (VALTREX) 1000 MG tablet Take 1,000 mg by mouth 2 (two) times daily.       Marland Kitchen zolpidem (AMBIEN) 5 MG tablet Take 1 tablet (5 mg total) by mouth at bedtime as needed for sleep.  30 tablet  0   Assessment: 78 yo complex HD patient with secondary cellulitis due to zoster infection. TTS HD schedule.   Goal of Therapy:  Pre HD vanc trough~ 71mcg/ml   Plan:  Vancomycin 1500mg  iv x1 then 750mg  iv after each full HD session of 4 hours.   Janice Coffin 02/10/2013,11:22 PM

## 2013-02-10 NOTE — ED Provider Notes (Addendum)
CSN: [id number]     Arrival date & time 02/10/13  1246 History     First MD Initiated Contact with Patient 02/10/13 1428     Chief Complaint  Patient presents with  . Vascular Access Problem  . Herpes Zoster   (Consider location/radiation/quality/duration/timing/severity/associated sxs/prior Treatment) HPI Patient sent here from dialysis. Could not be dialyzed today as on face was oozing frank pus. no treatment prior to coming here. Patient Patient denies pain denies visual changes. Admits to mild shortness of breath. Past Medical History  Diagnosis Date  . Hypertension   . Venous stasis of lower extremity WEARS TEDS    BLE--  CURRENTLY NO ULCERS/ WOUNDS  . Aortic insufficiency     mild/H&P 04/02/2012  . Type II diabetes mellitus   . OSA on CPAP   . Pulmonary hypertension per last echo 03-28-2012  mod.-severe     per dr wert note this related to L ht pressures from diastolic chf  . Hypertrophic obstructive cardiomyopathy(425.11)   . Arthritis   . CKD (chronic kidney disease) stage 3, GFR 30-59 ml/min   . Heart murmur   . History of DVT of lower extremity lower right leg nov 2012  . Hyperlipidemia   . History of gout 06-09-2012  per pt stable  . Chronic diastolic CHF (congestive heart failure), NYHA class 3 CARDIOLOGIST- DR HILTY--  LOV OCT 2013--  REQUESTED NOTE, EGK AND STRESS TEST    PER PT ON 06-09-2012  S&S OF CHF AND WT IS DOWN  . H/O hiatal hernia   . GERD (gastroesophageal reflux disease)   . Lower extremity edema   . PAF (paroxysmal atrial fibrillation), in SR on admit 09/25/2012  . Stroke Jan 2014    Rt brain, Nl CA doppler  . TIA (transient ischemic attack) 08/07/2012    2D Echo - EF 60-65, severe concentric hypertrophy in the left ventricle  . Dyspnea on exertion 11/30/2010    Gated Stress Dipyridamole Myocardial Perfusion - post stress EF-63, normal study, no ischemia demonstrated   . Hematoma, lt upper arm 01/23/2013   Past Surgical History  Procedure Laterality  Date  . Total knee arthroplasty  1988; 1998 X2    right; bilateral  . Tumor excision  1970's    "off my stomach"  . Cataract extraction w/ intraocular lens implant  ~ 2010    right; "put lens in; didn't work; had mass on my eye"  . Transthoracic echocardiogram  04-03-2012   DR KENNETH HILTY    LVSF  EF 80-85%/ HYPERTRONIC CARDIOMYOPATHY WITH MOSTLY MID-CAVITARY OBSTRUCTION AND A SMALLER COMPONENT OF LV OUTFLOW TRACT OBSTRUCTION/   EVIDENCE OF SEVERE DECOMPRESATION OF DIASTOLIC HEART FAILURE AND MODERATE TO SEVERE PULMONARY ARTERIAL HYPERTENSION  . Joint replacement  '88 and '98    TOTAL RIGHT KNEE X2  1988  &  1998 (DUE TO JOINT MALFUNCTION)   TOTAL LEFT KNEE 1998  . Carpometacarpel suspension plasty  06/16/2012    Procedure: CARPOMETACARPEL [facility]) SUSPENSION PLASTY;  Surgeon: Jodi Marble, MD;  Location: [facility];  Service: Orthopedics;  Laterality: Right;  RIGHT THUMB SUSPENSION ARTHROPLASTY.  Marland Kitchen Eye surgery Right ~ 2011    "not a cataract; just a growth" (09/26/2012)  . Cardiac catheterization Bilateral 01/19/2004    NL cors. neg Nuc 2012  . Av fistula placement N/A 01/12/2013    Procedure: ARTERIOVENOUS (AV) FISTULA CREATION;  Surgeon: Larina Earthly, MD;  Location: [facility] OR;  Service: Vascular;  Laterality: N/A;  .  Insertion of dialysis catheter N/A 01/12/2013    Procedure: INSERTION OF DIALYSIS CATHETER;  Surgeon: Larina Earthly, MD;  Location: [facility] OR;  Service: Vascular;  Laterality: N/A;   Family History  Problem Relation Age of Onset  . Other Mother   . Coronary artery disease Father     sudden death in his 47's  . Diabetes Father   . Cancer Father   . Diabetes Sister    History  Substance Use Topics  . Smoking status: Never Smoker   . Smokeless tobacco: Never Used  . Alcohol Use: No   OB History   Grav Para Term Preterm Abortions TAB SAB Ect Mult Living                 Review of Systems  Constitutional: Negative.   HENT: Negative.   Respiratory: Positive  for shortness of breath.   Cardiovascular: Negative.   Gastrointestinal: Negative.   Musculoskeletal: Negative.   Skin: Positive for rash.  Neurological: Negative.   Psychiatric/Behavioral: Negative.   All other systems reviewed and are negative.    Allergies  Review of patient's allergies indicates no known allergies.  Home Medications   Current Outpatient Rx  Name  Route  Sig  Dispense  Refill  . allopurinol (ZYLOPRIM) 300 MG tablet   Oral   Take 150 mg by mouth daily.         Marland Kitchen ALPRAZolam (XANAX) 0.25 MG tablet   Oral   Take 1 tablet (0.25 mg total) by mouth 2 (two) times daily as needed for anxiety.   30 tablet   0   . feeding supplement (RESOURCE BREEZE) LIQD   Oral   Take 1 Container by mouth 2 (two) times daily between meals.      0   . insulin aspart protamine- aspart (NOVOLOG MIX 70/30) (70-30) 100 UNIT/ML injection   Subcutaneous   Inject 5-10 Units into the skin 2 (two) times daily with a meal. Inject 10 units with breakfast and 5 units with supper         . darbepoetin (ARANESP) 200 MCG/0.4ML SOLN   Intravenous   Inject 0.4 mLs (200 mcg total) into the vein every Thursday with hemodialysis.   1.68 mL      . insulin lispro (HUMALOG) 100 UNIT/ML injection   Subcutaneous   Inject 2-5 Units into the skin 3 (three) times daily before meals. Sliding scale         . loperamide (IMODIUM) 2 MG capsule   Oral   Take 1 capsule (2 mg total) by mouth as needed for diarrhea or loose stools.   30 capsule   0   . multivitamin (RENA-VIT) TABS tablet   Oral   Take 1 tablet by mouth at bedtime.      0   . omeprazole (PRILOSEC) 20 MG capsule   Oral   Take 20 mg by mouth every morning.          Marland Kitchen oxyCODONE (OXY IR/ROXICODONE) 5 MG immediate release tablet   Oral   Take 1-2 tablets (5-10 mg total) by mouth every 2 (two) hours as needed.   360 tablet   0   . phenytoin (DILANTIN) 300 MG ER capsule   Oral   Take 1 capsule (300 mg total) by mouth at  bedtime.   30 capsule   2   . senna-docusate (SENOKOT-S) 8.6-50 MG per tablet   Oral   Take 1 tablet by mouth at bedtime as needed  for constipation.         . simvastatin (ZOCOR) 10 MG tablet   Oral   Take 10 mg by mouth at bedtime.         . traMADol (ULTRAM) 50 MG tablet   Oral   Take 50 mg by mouth every 8 (eight) hours.         . valACYclovir (VALTREX) 1000 MG tablet   Oral   Take 1,000 mg by mouth daily.         Marland Kitchen warfarin (COUMADIN) 1 MG tablet   Oral   Take 1.5 mg by mouth as directed.         . zolpidem (AMBIEN) 5 MG tablet   Oral   Take 1 tablet (5 mg total) by mouth at bedtime as needed for sleep.   30 tablet   0    BP 103/56  Temp(Src) 97.6 F (36.4 C) (Oral)  Resp 14  SpO2 96% Physical Exam  Nursing note and vitals reviewed. Constitutional: She appears well-developed and well-nourished.  HENT:  Head: Normocephalic and atraumatic.  Eyes: Conjunctivae are normal. Pupils are equal, round, and reactive to light.  Right eye fluorescein negative  Neck: Neck supple. No tracheal deviation present. No thyromegaly present.  Cardiovascular: Normal rate and regular rhythm.   No murmur heard. Pulmonary/Chest: Effort normal and breath sounds normal.  Abdominal: Soft. Bowel sounds are normal. She exhibits no distension. There is no tenderness.  Musculoskeletal: Normal range of motion. She exhibits no edema and no tenderness.  Neurological: She is alert. Coordination normal.  Skin: Skin is warm and dry. Rash noted.  There is a herpetic rash at the right side of the face, not crossing midline. Well-demarcated. Lesions are oozing frank copious thick yellow pus  Psychiatric: She has a normal mood and affect.    ED Course   Procedures (including critical care time)  Labs Reviewed - No data to display No results found. No diagnosis found. Chest xray viewed by me MDM  Patient with herpes zoster of the face. Has become superinfected. Spoke with  Dr.Schertz who will arrange for dialysis and for patient to get intravenous antibiotics during dialysis. He requests admission to medical service. Pt signed out to Dr. Blinda Leatherwood at430 pm who will check lab work and arrange for admission  Doug Sou, MD 02/10/13 1636  Doug Sou, MD 02/10/13 [phone]

## 2013-02-10 NOTE — ED Notes (Signed)
IV team paged again after attempting to gain IV access.

## 2013-02-10 NOTE — ED Notes (Signed)
Pt with hx of shingles.  Pt reported to dialysis clinic today and they refused to treat pt due to shingles oozing.  EMS reports that dialysis clinic staff stated that pt was scabbed over last week, but appears to be oozing this week.  They sent pt here for dialysis.

## 2013-02-11 ENCOUNTER — Encounter (HOSPITAL_COMMUNITY): Payer: Self-pay | Admitting: Internal Medicine

## 2013-02-11 DIAGNOSIS — Z7901 Long term (current) use of anticoagulants: Secondary | ICD-10-CM

## 2013-02-11 DIAGNOSIS — B029 Zoster without complications: Secondary | ICD-10-CM

## 2013-02-11 DIAGNOSIS — L0889 Other specified local infections of the skin and subcutaneous tissue: Secondary | ICD-10-CM

## 2013-02-11 DIAGNOSIS — B9689 Other specified bacterial agents as the cause of diseases classified elsewhere: Secondary | ICD-10-CM

## 2013-02-11 LAB — CBC WITH DIFFERENTIAL/PLATELET
Eosinophils Relative: 1 % (ref 0–5)
HCT: 36.9 % (ref 36.0–46.0)
Lymphs Abs: 1.5 10*3/uL (ref 0.7–4.0)
MCV: 103.9 fL — ABNORMAL HIGH (ref 78.0–100.0)
Monocytes Relative: 6 % (ref 3–12)
Neutro Abs: 7.8 10*3/uL — ABNORMAL HIGH (ref 1.7–7.7)
RDW: 22.1 % — ABNORMAL HIGH (ref 11.5–15.5)
WBC: 10 10*3/uL (ref 4.0–10.5)

## 2013-02-11 LAB — GLUCOSE, CAPILLARY
Glucose-Capillary: 57 mg/dL — ABNORMAL LOW (ref 70–99)
Glucose-Capillary: 92 mg/dL (ref 70–99)

## 2013-02-11 LAB — COMPREHENSIVE METABOLIC PANEL
BUN: 35 mg/dL — ABNORMAL HIGH (ref 6–23)
CO2: 26 mEq/L (ref 19–32)
Chloride: 98 mEq/L (ref 96–112)
Creatinine, Ser: 2.37 mg/dL — ABNORMAL HIGH (ref 0.50–1.10)
GFR calc non Af Amer: 19 mL/min — ABNORMAL LOW (ref >90)
Total Bilirubin: 1.1 mg/dL (ref 0.3–1.2)

## 2013-02-11 LAB — MAGNESIUM: Magnesium: 1.9 mg/dL (ref 1.5–2.5)

## 2013-02-11 LAB — PHOSPHORUS: Phosphorus: 4.1 mg/dL (ref 2.3–4.6)

## 2013-02-11 MED ORDER — SODIUM CHLORIDE 0.9 % IV SOLN: 100.0000 mL | INTRAVENOUS | Status: DC | PRN

## 2013-02-11 MED ORDER — PENTAFLUOROPROP-TETRAFLUOROETH EX AERO: 1.0000 "application " | INHALATION_SPRAY | CUTANEOUS | Status: DC | PRN

## 2013-02-11 MED ORDER — VANCOMYCIN HCL 500 MG IV SOLR
500.0000 mg | Freq: Once | INTRAVENOUS | Status: AC
  Administered 2013-02-11: 500 mg via INTRAVENOUS
  Filled 2013-02-11: qty 500

## 2013-02-11 MED ORDER — NEPRO/CARBSTEADY PO LIQD
237.0000 mL | ORAL | Status: DC | PRN
  Filled 2013-02-11: qty 237

## 2013-02-11 MED ORDER — HEPARIN SODIUM (PORCINE) 1000 UNIT/ML DIALYSIS: 1000.0000 [IU] | INTRAMUSCULAR | Status: DC | PRN

## 2013-02-11 MED ORDER — SODIUM CHLORIDE (HYPERTONIC) 5 % OP SOLN
1.0000 [drp] | Freq: Three times a day (TID) | OPHTHALMIC | Status: DC
  Administered 2013-02-11 – 2013-02-12 (×2): 1 [drp] via OPHTHALMIC
  Filled 2013-02-11: qty 15

## 2013-02-11 MED ORDER — VALACYCLOVIR HCL 500 MG PO TABS
500.0000 mg | ORAL_TABLET | Freq: Two times a day (BID) | ORAL | Status: DC
  Administered 2013-02-11: 500 mg via ORAL
  Filled 2013-02-11 (×2): qty 1

## 2013-02-11 MED ORDER — PIPERACILLIN-TAZOBACTAM IN DEX 2-0.25 GM/50ML IV SOLN
2.2500 g | Freq: Three times a day (TID) | INTRAVENOUS | Status: DC
  Administered 2013-02-11 (×2): 2.25 g via INTRAVENOUS
  Filled 2013-02-11 (×4): qty 50

## 2013-02-11 MED ORDER — SODIUM CHLORIDE (HYPERTONIC) 5 % OP OINT
TOPICAL_OINTMENT | Freq: Three times a day (TID) | OPHTHALMIC | Status: DC
  Administered 2013-02-11: 1 via OPHTHALMIC
  Filled 2013-02-11 (×2): qty 3.5

## 2013-02-11 MED ORDER — LIDOCAINE HCL (PF) 1 % IJ SOLN: 5.0000 mL | INTRAMUSCULAR | Status: DC | PRN

## 2013-02-11 MED ORDER — DEXTROSE 50 % IV SOLN
INTRAVENOUS | Status: AC
  Administered 2013-02-11: 25 mL
  Filled 2013-02-11: qty 50

## 2013-02-11 MED ORDER — LIDOCAINE-PRILOCAINE 2.5-2.5 % EX CREA: 1.0000 "application " | TOPICAL_CREAM | CUTANEOUS | Status: DC | PRN

## 2013-02-11 MED ORDER — ALTEPLASE 2 MG IJ SOLR
2.0000 mg | Freq: Once | INTRAMUSCULAR | Status: DC | PRN
  Filled 2013-02-11: qty 2

## 2013-02-11 MED ORDER — VANCOMYCIN HCL IN DEXTROSE 750-5 MG/150ML-% IV SOLN: 750.0000 mg | INTRAVENOUS | Status: AC

## 2013-02-11 MED ORDER — DEXTROSE 50 % IV SOLN: 25.0000 mL | Freq: Once | INTRAVENOUS | Status: AC | PRN

## 2013-02-11 MED ORDER — DOUBLE ANTIBIOTIC 500-10000 UNIT/GM EX OINT: 1.0000 "application " | TOPICAL_OINTMENT | Freq: Two times a day (BID) | CUTANEOUS | Status: AC

## 2013-02-11 MED ORDER — HEPARIN SODIUM (PORCINE) 1000 UNIT/ML DIALYSIS
2700.0000 [IU] | Freq: Once | INTRAMUSCULAR | Status: AC
  Administered 2013-02-11: 2700 [IU] via INTRAVENOUS_CENTRAL

## 2013-02-11 NOTE — Progress Notes (Deleted)
Pt had HR of 191 sustained for 2 minutes. Pt is resting with eyes closed, no distress noted. Merdis Delay NP made aware. Awaiting call back. Will continue to monitor.

## 2013-02-11 NOTE — Clinical Social Work Psychosocial (Signed)
Clinical Social Work Department BRIEF PSYCHOSOCIAL ASSESSMENT 02/11/2013  Patient:  [name], [name]     Account Number:  [id number]     Admit date:  02/10/2013  Clinical Social Worker:  Delmer Islam  Date/Time:  02/11/2013 03:04 AM  Referred by:  Physician  Date Referred:  02/11/2013 Referred for  SNF Placement   Other Referral:   Interview type:  Family Other interview type:    PSYCHOSOCIAL DATA Living Status:  FACILITY Admitted from facility:  [facility] Level of care:  Skilled Nursing Facility Primary support name:   Primary support relationship to patient:   Degree of support available:    CURRENT CONCERNS Current Concerns  Post-Acute Placement   Other Concerns:    SOCIAL WORK ASSESSMENT / PLAN CSW informed by MD that patient ready to d/c back to SNF. CSW contacted husband to inform him of discharge and advise him that patient will transport to facility via ambulance. Call made to facility to inform them of d/c and clinicals forwarded to facility.   Assessment/plan status:  No Further Intervention Required Other assessment/ plan:   Information/referral to community resources:    PATIENT'S/FAMILY'S RESPONSE TO PLAN OF CARE: Husband appreciative of CSW's contact with him regarding discharge.

## 2013-02-11 NOTE — Progress Notes (Signed)
ANTICOAGULATION CONSULT NOTE - Follow Up Consult  Pharmacy Consult for Warfarin Indication: DVT, afib  No Known Allergies  Patient Measurements: Height: 5\' 6"  (167.6 cm) Weight: 197 lb 15.6 oz (89.8 kg) IBW/kg (Calculated) : 59.3 Heparin Dosing Weight:   Vital Signs: Temp: 98.6 F (37 C) (07/30 1340) Temp src: Oral (07/30 1340) BP: 116/67 mmHg (07/30 1630) Pulse Rate: 84 (07/30 1630)  Labs:  Recent Labs  02/10/13 1818 02/10/13 1840 02/11/13 1410  HGB 15.6* 12.7 11.9*  HCT 46.0 39.4 36.9  PLT  --  326 306  LABPROT  --  31.0* 34.7*  INR  --  3.12* 3.62*  CREATININE 2.50*  --  2.37*    Estimated Creatinine Clearance: 22.4 ml/min (by C-G formula based on Cr of 2.37).   Medications:  Prescriptions prior to admission  Medication Sig Dispense Refill  . acetaminophen (TYLENOL) 325 MG tablet Take 650 mg by mouth every 4 (four) hours as needed for pain.      Marland Kitchen allopurinol (ZYLOPRIM) 300 MG tablet Take 150 mg by mouth daily.      Marland Kitchen ALPRAZolam (XANAX) 0.25 MG tablet Take 1 tablet (0.25 mg total) by mouth 2 (two) times daily as needed for anxiety.  30 tablet  0  . Emollient (HYDROPHOR) OINT Apply 1 application topically See admin instructions. Apply thin layer after washing face until shingles resolved      . feeding supplement (PRO-STAT SUGAR FREE 64) LIQD Take 30 mLs by mouth 2 (two) times daily.      . feeding supplement (RESOURCE BREEZE) LIQD Take 1 Container by mouth 2 (two) times daily between meals.    0  . insulin aspart protamine- aspart (NOVOLOG MIX 70/30) (70-30) 100 UNIT/ML injection Inject 5-10 Units into the skin 2 (two) times daily with a meal. Inject 10 units with breakfast and 5 units with supper      . Multiple Vitamins-Minerals (CERTA PLUS) TABS Take 1 tablet by mouth at bedtime.      Marland Kitchen nystatin cream (MYCOSTATIN) Apply 1 application topically 2 (two) times daily.      Marland Kitchen omeprazole (PRILOSEC) 20 MG capsule Take 20 mg by mouth daily.      Marland Kitchen oxyCODONE (OXY  IR/ROXICODONE) 5 MG immediate release tablet Take 5-10 mg by mouth every 2 (two) hours as needed for pain.      . phenytoin (DILANTIN) 100 MG ER capsule Take 300 mg by mouth at bedtime.      . sodium chloride (MURO 128) 2 % ophthalmic solution Place 2 drops into the right eye 3 (three) times daily.      Marland Kitchen warfarin (JANTOVEN) 3 MG tablet Take 1.5 mg by mouth daily.      . darbepoetin (ARANESP) 200 MCG/0.4ML SOLN Inject 0.4 mLs (200 mcg total) into the vein every Thursday with hemodialysis.  1.68 mL    . insulin lispro (HUMALOG) 100 UNIT/ML injection Inject 2-5 Units into the skin 3 (three) times daily before meals. Sliding scale      . loperamide (IMODIUM) 2 MG capsule Take 1 capsule (2 mg total) by mouth as needed for diarrhea or loose stools.  30 capsule  0  . senna-docusate (SENOKOT-S) 8.6-50 MG per tablet Take 1 tablet by mouth at bedtime as needed for constipation.      . simvastatin (ZOCOR) 10 MG tablet Take 10 mg by mouth at bedtime.      . traMADol (ULTRAM) 50 MG tablet Take 50 mg by mouth every 8 (eight) hours.      Marland Kitchen  zolpidem (AMBIEN) 5 MG tablet Take 1 tablet (5 mg total) by mouth at bedtime as needed for sleep.  30 tablet  0  . [DISCONTINUED] valACYclovir (VALTREX) 1000 MG tablet Take 1,000 mg by mouth 2 (two) times daily.         Assessment: 79 y/o F with h/o afib and DVT on Jantoven at SNF. CHAD2=4. INR at admission was 3.12 on warfarin 1.5mg  daily . INR today up to 3.62.  Goal of Therapy:  INR 2-3 Monitor platelets by anticoagulation protocol: Yes   Plan:  Hold warfarin tonight  [name] 02/11/2013,4:36 PM

## 2013-02-11 NOTE — Evaluation (Signed)
Physical Therapy Evaluation Patient Details Name: [name] MRN: [id number] DOB: 16-Dec-1933 Today's Date: 02/11/2013 Time: 4098-1191 PT Time Calculation (min): 13 min  PT Assessment / Plan / Recommendation History of Present Illness  79 y.o. female with Past medical history of diabetes mellitus, hypertension, ESRD now on dialysis with a recent fistula, chronic diastolic dysfunction with LV outflow tract obstruction, A. fib, history of DVT, obstructive sleep apnea on CPAP, coming from skilled nursing facility.    Clinical Impression  Pt admitted with shingles. Pt currently with functional limitations due to the deficits listed below (see PT Problem List).  Pt will benefit from skilled PT to increase their independence and safety with mobility to allow discharge back to SNF for rehab.     PT Assessment  Patient needs continued PT services    Follow Up Recommendations  Supervision/Assistance - 24 hour;SNF    Does the patient have the potential to tolerate intense rehabilitation      Barriers to Discharge        Equipment Recommendations  None recommended by PT (defer to SNF)    Recommendations for Other Services     Frequency Min 3X/week    Precautions / Restrictions Precautions Precautions: Fall Restrictions Weight Bearing Restrictions: No   Pertinent Vitals/Pain Pt reported dizziness sitting EOB, returned to supine, remained on O2 [place]      Mobility  Bed Mobility Bed Mobility: Rolling Left Rolling Left: 6: Modified independent (Device/Increase time);With rail Supine to Sit: 4: Min guard;HOB elevated Sitting - Scoot to Edge of Bed: 5: Supervision;With rail Sit to Supine: 4: Min assist;HOB flat Details for Bed Mobility Assistance: verbal cues for technique, assist for LEs onto bed Transfers Transfers: Not assessed (pt reports dizziness with sitting, feels unable to transfer)    Exercises     PT Diagnosis: Generalized weakness;Difficulty walking  PT Problem List:  Decreased strength;Decreased activity tolerance;Decreased mobility;Decreased knowledge of use of DME PT Treatment Interventions: DME instruction;Gait training;Functional mobility training;Therapeutic activities;Therapeutic exercise;Balance training;Patient/family education     PT Goals(Current goals can be found in the care plan section) Acute Rehab PT Goals PT Goal Formulation: With patient Time For Goal Achievement: 02/25/13 Potential to Achieve Goals: Fair  Visit Information  Last PT Received On: 02/11/13 Assistance Needed: +2 History of Present Illness: 79 y.o. female with Past medical history of diabetes mellitus, hypertension, ESRD now on dialysis with a recent fistula, chronic diastolic dysfunction with LV outflow tract obstruction, A. fib, history of DVT, obstructive sleep apnea on CPAP, coming from skilled nursing facility.         Prior Functioning  Home Living Family/patient expects to be discharged to:: Skilled nursing facility Prior Function Level of Independence: Needs assistance Gait / Transfers Assistance Needed: pt reports not walking at SNF and sometimes lift required to get to w/c    Cognition  Cognition Arousal/Alertness: Lethargic Overall Cognitive Status: Difficult to assess Difficult to assess due to: Level of arousal (lethargic possibly from Ambien last night per MD note)    Extremity/Trunk Assessment Lower Extremity Assessment Lower Extremity Assessment: Generalized weakness   Balance Balance Balance Assessed: Yes Static Sitting Balance Static Sitting - Balance Support: Feet supported;Left upper extremity supported Static Sitting - Level of Assistance: 5: Stand by assistance Static Sitting - Comment/# of Minutes: sat EOB for 5 min, dizziness did not resolve so pt returned to supine  End of Session PT - End of Session Equipment Utilized During Treatment: Oxygen Activity Tolerance: Patient limited by fatigue;Patient limited by lethargy  Patient left: in  bed;with call bell/phone within reach;with bed alarm set Nurse Communication:  (pt on bed pan)  GP     [name],[name] 02/11/2013, 11:19 AM [name], PT, DPT 02/11/2013 Pager: [phone]

## 2013-02-11 NOTE — Consult Note (Signed)
[facility]    Date of Admission:  02/10/2013           Day 1 vancomycin        Day 1 piperacillin tazobactam       Reason for Consult: Bacterial superinfection of recent right zoster lesions    Referring Physician: Dr. Lynden Oxford  Principal Problem:   Secondary infection of skin Active Problems:   Herpes zoster keratoconjunctivitis   Sleep apnea on c-pap    Morbid obesity   Chronic venous insufficiency with LE ulcers, will be followed at wound center, currently with wraps   Diastolic dysfunction stage 2 EF 60-65%, with outflow track obstruction Jan 2014   HTN (hypertension)   PAF (paroxysmal atrial fibrillation),    Long term (current) use of anticoagulants   Seizure disorder on dilatin   Chronic kidney disease (CKD), stage IV (severe), now with HD   Type II diabetes mellitus with end-stage renal disease   . allopurinol  150 mg Oral Daily  . insulin aspart  0-15 Units Subcutaneous TID WC  . insulin aspart  0-5 Units Subcutaneous QHS  . insulin aspart protamine- aspart  5 Units Subcutaneous BID WC  . phenytoin  300 mg Oral QHS  . piperacillin-tazobactam (ZOSYN)  IV  2.25 g Intravenous Q8H  . simvastatin  10 mg Oral QHS  . sodium chloride  1 drop Both Eyes TID  . sodium chloride  3 mL Intravenous Q12H  . traMADol  50 mg Oral Q8H  . valACYclovir  500 mg Oral BID  . vancomycin  500 mg Intravenous Once  . [START ON 02/12/2013] vancomycin  750 mg Intravenous Q T,Th,Sa-HD  . warfarin  1.5 mg Oral q1800  . Warfarin - Physician Dosing Inpatient   Does not apply q1800    Recommendations: 1. Continue IV vancomycin pending wound cultures  2. Discontinue piperacillin tazobactam and valacyclovir 3. Discontinue airborne precautions  Assessment: She developed zoster about 3 weeks ago and appears to have improved with valacyclovir. She now has superinfection and Gram stain of wound drainage revealed gram-positive cocci in pairs. I favor  continuing empiric therapy with vancomycin alone. It is very unlikely that her zoster lesions are active. I would stop valacyclovir and isolation at this point.    HPI: [name] is a 79 y.o. female who developed lesions compatible with zoster on her right scalp forehead and nose on July 11. She started on valacyclovir and referred to an ophthalmologist. She tells me she has been using eyedrops. She denies any loss or change in her vision. The rash was reported to be healing but then she developed increased inflammation and drainage from the lesions compatible with secondary bacterial infection and was admitted to the hospital.   Review of Systems: Review of systems not obtained due to patient factors.  Past Medical History  Diagnosis Date  . Hypertension   . Venous stasis of lower extremity WEARS TEDS    BLE--  CURRENTLY NO ULCERS/ WOUNDS  . Aortic insufficiency     mild/H&P 04/02/2012  . Type II diabetes mellitus   . Pulmonary hypertension per last echo 03-28-2012  mod.-severe     per dr wert note this related to L ht pressures from diastolic chf  . Hypertrophic obstructive cardiomyopathy(425.11)   . Arthritis   . CKD (chronic kidney disease) stage 3, GFR 30-59 ml/min   . Heart murmur   . History of DVT  of lower extremity lower right leg nov 2012  . Hyperlipidemia   . History of gout 06-09-2012  per pt stable  . Chronic diastolic CHF (congestive heart failure), NYHA class 3 CARDIOLOGIST- DR HILTY--  LOV OCT 2013--  REQUESTED NOTE, EGK AND STRESS TEST    PER PT ON 06-09-2012  S&S OF CHF AND WT IS DOWN  . H/O hiatal hernia   . GERD (gastroesophageal reflux disease)   . Lower extremity edema   . PAF (paroxysmal atrial fibrillation), in SR on admit 09/25/2012  . TIA (transient ischemic attack) 08/07/2012    2D Echo - EF 60-65, severe concentric hypertrophy in the left ventricle  . Dyspnea on exertion 11/30/2010    Gated Stress Dipyridamole Myocardial Perfusion - post stress EF-63,  normal study, no ischemia demonstrated   . Hematoma, lt upper arm 01/23/2013  . OSA on CPAP   . Stroke Jan 2014    Rt brain, Nl CA doppler    History  Substance Use Topics  . Smoking status: Never Smoker   . Smokeless tobacco: Never Used  . Alcohol Use: No    Family History  Problem Relation Age of Onset  . Other Mother   . Coronary artery disease Father     sudden death in his 67's  . Diabetes Father   . Cancer Father   . Diabetes Sister    No Known Allergies  OBJECTIVE: Blood pressure 125/69, pulse 88, temperature 97.7 F (36.5 C), temperature source Oral, resp. rate 20, height 5\' 6"  (1.676 m), weight 89.2 kg (196 lb 10.4 oz), SpO2 99.00%. General: She is very groggy and has trouble staying awake during exam. Has difficulty answering questions Skin: She has scars over her right scalp forehead nose and cheeks compatible with recent zoster. There is some wound drainage that is yellow-green and they're cold and yellow crusts on some of the lesions Eyes: No conjunctival injection. Pupils equal and round Lungs: Clear [place]: Very distant heart sounds Abdomen: Soft and nontender Joints and extremities: Healed incision over her right knee compatible with previous total knee arthroplasty. Venous stasis dermatitis both lower legs without any open lesions  Lab Results  Component Value Date   WBC 10.5 02/10/2013   HGB 12.7 02/10/2013   HCT 39.4 02/10/2013   MCV 104.2* 02/10/2013   PLT 326 02/10/2013   Lab Results  Component Value Date   CREATININE 2.50* 02/10/2013   BUN 42* 02/10/2013   NA 134* 02/10/2013   K 5.3* 02/10/2013   CL 99 02/10/2013   CO2 28 01/23/2013   Microbiology: Recent Results (from the past 240 hour(s))  WOUND CULTURE     Status: None   Collection Time    02/10/13  4:28 PM      Result Value Range Status   Specimen Description WOUND HEAD   Final   Special Requests NONE   Final   Gram Stain     Final   Value: ABUNDANT WBC PRESENT, PREDOMINANTLY PMN     NO  SQUAMOUS EPITHELIAL CELLS SEEN     MODERATE GRAM POSITIVE COCCI IN PAIRS   Culture Culture reincubated for better growth   Final   Report Status PENDING   Incomplete    Cliffton Asters, MD [facility] [phone] pager   [phone] cell 02/11/2013, 12:37 PM

## 2013-02-11 NOTE — H&P (Signed)
[facility] History and Physical  [name]  HYQ:[id number]  DOB: 18-May-1934  DOA: 02/10/2013  Referring physician: Gilda Crease PCP: Alva Garnet., MD  Specialists: Vinson Moselle MD, infectious disease  Chief Complaint: Shingles  HPI: [name] is a 79 y.o. female with Past medical history of diabetes mellitus, hypertension, ESRD now on dialysis with a recent fistula, chronic diastolic dysfunction with LV outflow tract obstruction, A. fib, history of DVT, obstructive sleep apnea on CPAP, coming from skilled nursing facility.  Patient was recently hospitalized for acute CHF and was discharged to the nursing home. During that hospitalization she underwent insertion of fistula/dialysis catheter.  Into nursing home she complained about lesion on her right side of the face on 7/11. She saw an outpatient physician and was started on Valcyclovir 1 g twice a day. She was also recommended to followup with the ophthalmologist. As far the last note from the nursing home the patient's shingles were getting better, and there was no sign of secondary infection. She was also treated her for a one-week course of prednisone 50 mg and tramadol for pain control.  Patient got her dialysis treatment last time on Thursday. And then she could not get her dialysis treatment in between due to feeling tired and weakness. When she went for the dialysis treatment today, she had white-yellow discharge coming from shingles lesion, and therefore the dialysis center transfer the patient here. Pt denies any headache, nausea, vomiting, abdominal pain, diarrhea, constipation, active bleeding, chest pain, palpitation, dizziness, focal neurological deficit, cough.   Review of Systems: as mentioned in the history of present illness.  A Comprehensive review of the other systems is negative.  Past Medical History  Diagnosis Date  . Hypertension   . Venous stasis of lower extremity WEARS TEDS    BLE--   CURRENTLY NO ULCERS/ WOUNDS  . Aortic insufficiency     mild/H&P 04/02/2012  . Type II diabetes mellitus   . Pulmonary hypertension per last echo 03-28-2012  mod.-severe     per dr wert note this related to L ht pressures from diastolic chf  . Hypertrophic obstructive cardiomyopathy(425.11)   . Arthritis   . CKD (chronic kidney disease) stage 3, GFR 30-59 ml/min   . Heart murmur   . History of DVT of lower extremity lower right leg nov 2012  . Hyperlipidemia   . History of gout 06-09-2012  per pt stable  . Chronic diastolic CHF (congestive heart failure), NYHA class 3 CARDIOLOGIST- DR HILTY--  LOV OCT 2013--  REQUESTED NOTE, EGK AND STRESS TEST    PER PT ON 06-09-2012  S&S OF CHF AND WT IS DOWN  . H/O hiatal hernia   . GERD (gastroesophageal reflux disease)   . Lower extremity edema   . PAF (paroxysmal atrial fibrillation), in SR on admit 09/25/2012  . TIA (transient ischemic attack) 08/07/2012    2D Echo - EF 60-65, severe concentric hypertrophy in the left ventricle  . Dyspnea on exertion 11/30/2010    Gated Stress Dipyridamole Myocardial Perfusion - post stress EF-63, normal study, no ischemia demonstrated   . Hematoma, lt upper arm 01/23/2013  . OSA on CPAP   . Stroke Jan 2014    Rt brain, Nl CA doppler   Past Surgical History  Procedure Laterality Date  . Total knee arthroplasty  1988; 1998 X2    right; bilateral  . Tumor excision  1970's    "off my stomach"  . Cataract extraction w/ intraocular lens  implant  ~ 2010    right; "put lens in; didn't work; had mass on my eye"  . Transthoracic echocardiogram  04-03-2012   DR KENNETH HILTY    LVSF  EF 80-85%/ HYPERTRONIC CARDIOMYOPATHY WITH MOSTLY MID-CAVITARY OBSTRUCTION AND A SMALLER COMPONENT OF LV OUTFLOW TRACT OBSTRUCTION/   EVIDENCE OF SEVERE DECOMPRESATION OF DIASTOLIC HEART FAILURE AND MODERATE TO SEVERE PULMONARY ARTERIAL HYPERTENSION  . Joint replacement  '88 and '98    TOTAL RIGHT KNEE X2  1988  &  1998 (DUE TO JOINT  MALFUNCTION)   TOTAL LEFT KNEE 1998  . Carpometacarpel suspension plasty  06/16/2012    Procedure: CARPOMETACARPEL [facility]) SUSPENSION PLASTY;  Surgeon: Jodi Marble, MD;  [facility]: [facility];  Service: Orthopedics;  Laterality: Right;  RIGHT THUMB SUSPENSION ARTHROPLASTY.  Marland Kitchen Eye surgery Right ~ 2011    "not a cataract; just a growth" (09/26/2012)  . Cardiac catheterization Bilateral 01/19/2004    NL cors. neg Nuc 2012  . Av fistula placement N/A 01/12/2013    Procedure: ARTERIOVENOUS (AV) FISTULA CREATION;  Surgeon: Larina Earthly, MD;  Location: [facility] OR;  Service: Vascular;  Laterality: N/A;  . Insertion of dialysis catheter N/A 01/12/2013    Procedure: INSERTION OF DIALYSIS CATHETER;  Surgeon: Larina Earthly, MD;  Location[facility] OR;  Service: Vascular;  Laterality: N/A;   Social History:  reports that she has never smoked. She has never used smokeless tobacco. She reports that she does not drink alcohol or use illicit drugs. Patient is coming from SNF. Patient can not participate in ADLs.  No Known Allergies  Family History  Problem Relation Age of Onset  . Other Mother   . Coronary artery disease Father     sudden death in his 53's  . Diabetes Father   . Cancer Father   . Diabetes Sister     Prior to Admission medications   Medication Sig Start Date End Date Taking? Authorizing Provider  acetaminophen (TYLENOL) 325 MG tablet Take 650 mg by mouth every 4 (four) hours as needed for pain.   Yes Historical Provider, MD  allopurinol (ZYLOPRIM) 300 MG tablet Take 150 mg by mouth daily.   Yes Historical Provider, MD  ALPRAZolam (XANAX) 0.25 MG tablet Take 1 tablet (0.25 mg total) by mouth 2 (two) times daily as needed for anxiety. 01/23/13  Yes Nada Boozer, NP  Emollient (HYDROPHOR) OINT Apply 1 application topically See admin instructions. Apply thin layer after washing face until shingles resolved   Yes Historical Provider, MD  feeding supplement (PRO-STAT SUGAR FREE 64) LIQD  Take 30 mLs by mouth 2 (two) times daily.   Yes Historical Provider, MD  feeding supplement (RESOURCE BREEZE) LIQD Take 1 Container by mouth 2 (two) times daily between meals. 01/23/13  Yes Nada Boozer, NP  insulin aspart protamine- aspart (NOVOLOG MIX 70/30) (70-30) 100 UNIT/ML injection Inject 5-10 Units into the skin 2 (two) times daily with a meal. Inject 10 units with breakfast and 5 units with supper   Yes Historical Provider, MD  Multiple Vitamins-Minerals (CERTA PLUS) TABS Take 1 tablet by mouth at bedtime.   Yes Historical Provider, MD  nystatin cream (MYCOSTATIN) Apply 1 application topically 2 (two) times daily.   Yes Historical Provider, MD  oxyCODONE (OXY IR/ROXICODONE) 5 MG immediate release tablet Take 5-10 mg by mouth every 2 (two) hours as needed for pain.   Yes Historical Provider, MD  phenytoin (DILANTIN) 100 MG ER capsule Take 300 mg by mouth  at bedtime.   Yes Historical Provider, MD  sodium chloride (MURO 128) 2 % ophthalmic solution Place 1 drop into both eyes 3 (three) times daily.   Yes Historical Provider, MD  warfarin (JANTOVEN) 3 MG tablet Take 1.5 mg by mouth daily.   Yes Historical Provider, MD  darbepoetin (ARANESP) 200 MCG/0.4ML SOLN Inject 0.4 mLs (200 mcg total) into the vein every Thursday with hemodialysis. 01/23/13   Nada Boozer, NP  insulin lispro (HUMALOG) 100 UNIT/ML injection Inject 2-5 Units into the skin 3 (three) times daily before meals. Sliding scale    Historical Provider, MD  loperamide (IMODIUM) 2 MG capsule Take 1 capsule (2 mg total) by mouth as needed for diarrhea or loose stools. 01/23/13   Nada Boozer, NP  senna-docusate (SENOKOT-S) 8.6-50 MG per tablet Take 1 tablet by mouth at bedtime as needed for constipation.    Historical Provider, MD  simvastatin (ZOCOR) 10 MG tablet Take 10 mg by mouth at bedtime.    Historical Provider, MD  traMADol (ULTRAM) 50 MG tablet Take 50 mg by mouth every 8 (eight) hours.    Historical Provider, MD  valACYclovir  (VALTREX) 1000 MG tablet Take 1,000 mg by mouth 2 (two) times daily.  02/03/13 02/22/13  Historical Provider, MD  zolpidem (AMBIEN) 5 MG tablet Take 1 tablet (5 mg total) by mouth at bedtime as needed for sleep. 01/23/13   Nada Boozer, NP    Physical Exam: Filed Vitals:   02/10/13 2045 02/10/13 2100 02/10/13 2130 02/10/13 2311  BP: 105/85 113/78 118/59 126/61  Pulse: 90 88 88 88  Temp:    97.4 F (36.3 C)  TempSrc:    Oral  Resp:    18  Height:    5\' 6"  (1.676 m)  Weight:    89.2 kg (196 lb 10.4 oz)  SpO2: 100% 100% 100% 100%    General: Alert, Awake and Oriented to Time, Place and Person. Appear in moderate distress Eyes: PERRL, swelling of the conjunctiva and right eyelid, vision equal both sides ENT: Oral Mucosa clear dry. Neck: No JVD, no Carotid Bruits, no Stiffness Cardiovascular: S1 and S2 Present, Murmur present in aortic area, Peripheral Pulses Present Respiratory: Clear to Auscultation, Bilateral Air entry equal and Decreased, Abdomen: Bowel Sound Present, Soft and Non tender, no Organomegaly Skin: Lower extremity wrapped, redness in the intertriginous area under the breast. Extremities: Pedal edema present, calf tenderness absent. Neurologic: Mental status, Motor strength, Sensation, reflexes, Proprioception Grossly Unremarkable.  Labs on Admission:  Basic Metabolic Panel:  Recent Labs Lab 02/10/13 1818  NA 134*  K 5.3*  CL 99  GLUCOSE 102*  BUN 42*  CREATININE 2.50*   Liver Function Tests: No results found for this basename: AST, ALT, ALKPHOS, BILITOT, PROT, ALBUMIN,  in the last 168 hours No results found for this basename: LIPASE, AMYLASE,  in the last 168 hours No results found for this basename: AMMONIA,  in the last 168 hours CBC:  Recent Labs Lab 02/10/13 1818 02/10/13 1840  WBC  --  10.5  HGB 15.6* 12.7  HCT 46.0 39.4  MCV  --  104.2*  PLT  --  326   Cardiac Enzymes: No results found for this basename: CKTOTAL, CKMB, CKMBINDEX, TROPONINI,   in the last 168 hours  BNP (last 3 results)  Recent Labs  12/30/12 1652 01/01/13 0345 01/08/13 0445  PROBNP 22049.0* 21019.0* 13502.0*   CBG:  Recent Labs Lab 02/10/13 2243  GLUCAP 79    Radiological Exams on  Admission: Dg Chest Port 1 View  02/10/2013   *RADIOLOGY REPORT*  Clinical Data: Shortness of breath.  Current history of diabetes, hypertension, and end-stage renal disease on hemodialysis.  PORTABLE CHEST - 1 VIEW  Comparison: Portable chest x-ray 01/15/2013 dating back to 12/30/2012.  Findings: Cardiac silhouette moderately enlarged but stable. Pulmonary venous hypertension with perhaps minimal interstitial pulmonary edema.  No confluent airspace consolidation.  No visible pleural effusions.  Right jugular dialysis catheter tip projects over the lower SVC at or near the cavoatrial junction. Degenerative changes noted in the right shoulder joint.  IMPRESSION: Stable cardiomegaly.  Pulmonary venous hypertension with perhaps minimal interstitial pulmonary edema.   Original Report Authenticated By: Hulan Saas, M.D.   Assessment/Plan Principal Problem:   Secondary infection of skin Active Problems:   Sleep apnea on c-pap    Morbid obesity   Chronic venous insufficiency with LE ulcers, will be followed at wound center, currently with wraps   Diastolic dysfunction stage 2 EF 60-65%, with outflow track obstruction Jan 2014   HTN (hypertension)   PAF (paroxysmal atrial fibrillation),    Long term (current) use of anticoagulants   Seizure disorder on dilatin   Chronic kidney disease (CKD), stage IV (severe), now with HD   Herpes zoster keratoconjunctivitis   Type II diabetes mellitus with end-stage renal disease   1. Herpes zoster ophthalmicus with secondary bacterial infection The patient has the outpatient nodes has been getting better on Valtrex. And now has secondary infection of the skin. Does not look like orbital cellulitis. We will consult infectious disease and  ophthalmology for further workup. Patient will be treated with broad-spectrum antibiotic including IV vancomycin and IV Zosyn. Oral Valtrex will be continued Wound care team will be consulted.  2. diabetes mellitus Continue home dose of insulin with a sliding scale  3. history of seizure Continue Dilantin at home dose  4. A. fib on Coumadin INR therapeutic continue Coumadin  5. ESRD on dialysis Nephrology consulted and appreciated input Patient will be getting dialysis tomorrow  DVT Prophylaxis: On therapeutic Coumadin Nutrition: Renal diet  Code Status: Full    Author: Lynden Oxford, MD [facility] Pager: [phone] 02/11/2013 12:07 AM    If 7PM-7AM, please contact night-coverage [email or web address] Password [email or web address]

## 2013-02-11 NOTE — Consult Note (Signed)
WOC consult Note Reason for Consult: Consult requested for BLE stasis ulcers.  Pt familiar to [facility] team from previous admission in June.  She has also been followed by [facility] team prior to admission.  Removed bilat compression wraps of kerlex and coban.  No open wounds or drainage at this time.  Legs without significant edema.  Dry peeling skin in patchy areas but previous wounds have healed.  Do not think compression wraps or topical treatment is needed at this time.  Legs may remain open to air. Please re-consult if further assistance is needed.  Thank-you,  Cammie Mcgee MSN, RN, CWOCN, [place], CNS [phone]

## 2013-02-11 NOTE — Progress Notes (Addendum)
Subjective:  Somnolent, difficult to arouse, possibly secondary to Ambien (given at 12:30 am)  Objective: Vital signs in last 24 hours: Temp:  [97.4 F (36.3 C)-97.7 F (36.5 C)] 97.7 F (36.5 C) (07/30 0634) Pulse Rate:  [80-91] 88 (07/30 0634) Resp:  [14-27] 20 (07/30 0634) BP: (89-127)/(45-85) 125/69 mmHg (07/30 0634) SpO2:  [96 %-100 %] 99 % (07/30 0634) Weight:  [89.2 kg (196 lb 10.4 oz)] 89.2 kg (196 lb 10.4 oz) (07/29 2311) Weight change:      Lab Results:  Recent Labs  02/10/13 1818 02/10/13 1840  WBC  --  10.5  HGB 15.6* 12.7  HCT 46.0 39.4  PLT  --  326   BMET:  Recent Labs  02/10/13 1818  NA 134*  K 5.3*  CL 99  GLUCOSE 102*  BUN 42*  CREATININE 2.50*   No results found for this basename: PTH,  in the last 72 hours Iron Studies: No results found for this basename: IRON, TIBC, TRANSFERRIN, FERRITIN,  in the last 72 hours EXAM: Gen:  Somnolent, dressing over forehead, purulent lesions much better Resp:  CTA without rales, rhonchi, or wheezes Cardio:  RRR with Gr II/VI systolic murmur, no rub GI:  + BS, soft and nontender Extremities:  Lower legs wrapped, pedal edema L>R Access:  R IJ catheter, maturing AVF @ [facility] with + bruit    Dialysis Orders: [facility] @ [facility]  4 hrs 92 kg 3K/2.5Ca 400/800 Heparin 2700 R IJ catheter    AVF @ [facility] (maturing, 6/30 by Dr. Arbie Cookey)  Hectorol 0   Epogen 0   Venofer 50 mg/wk   Assessment/Plan: 1. Herpes Zoster - open, oozing wounds over right side of face, improving on Valcyclovir 1000 mg bid and IV Zosyn; inpatient HD until clearance by ID to return to center.  Reduce Valcyclovir to 500 mg bid for ESRD pt (per pharmacy). Per ID this is superinfection, isolation d/c'd. Will d/w primary 2. ESRD - HD on TTS @ [place], K 5.3. K pending, HD today, then resume TTS. 3. HTN/Volume - BP controlled (125/69), no meds; chest x-ray showed minimal interstitial edema , small gains between HD, maintaining EDW.  4. Anemia - Hgb 12.7, on Epogen  20,000 U until 7/24; last T-sat 33% s/p Venofer series, now qwk.  5. Sec HPT - Ca 8, last P 2.8, iPTH 81 on 7/15; no Hectorol or binders.  6. Nutrition - Last Alb 2.9 on 7/15, renal diet, vitamin.  7. DM - on insulin.  8. Seizure disorder - on Dilantin.  9. PAF - on Coumadin.  10. Chronic venous insufficiency - with LE ulcers, followed by wound care.   LOS: 1 day   LYLES,CHARLES 02/11/2013,8:09 AM  Patient seen and examined.  I agree with assessment and plan as above with additions as indicated. Vinson Moselle  MD Pager [phone]    Cell  [phone] 02/11/2013, 1:27 PM

## 2013-02-11 NOTE — Procedures (Signed)
I was present at this dialysis session. I have reviewed the session itself and made appropriate changes.   Theresa Moselle  MD Pager [phone]    Cell  [phone] 02/11/2013, 4:49 PM

## 2013-02-11 NOTE — Progress Notes (Signed)
Hypoglycemic Event  CBG: 57  Treatment: D50 IV 25 mL  Symptoms: Lethargic  Follow-up CBG: Time: 18:31 CBG Result: 92  Possible Reasons for Event: Inadequate meal intake  Comments/MD notified: MD notified    Dayna Alia L  Remember to initiate Hypoglycemia Order Set & complete

## 2013-02-11 NOTE — Progress Notes (Signed)
Pt orientation to unit, room and routine. Information packet given to patient.  Admission INP armband ID verified with patient and in place. SR up x 2, fall risk assessment complete with Patient verbalizing understanding of risks associated with falls. Pt verbalizes an understanding of how to use the call bell and to call for help before getting out of bed.    Will cont to monitor and assist as needed.  Gilman Schmidt, RN

## 2013-02-11 NOTE — Discharge Summary (Addendum)
Physician Discharge Summary  CLELA HAGADORN MVH:[id number] DOB: [birth year] DOA: 02/10/2013  PCP: Alva Garnet., MD  Admit date: 02/10/2013 Discharge date: 02/12/2013  Time spent: 40 minutes  Recommendations for Outpatient Follow-up:  Discharge to [facility] place. Patient doesnot need further isolation for shingles. Please follow wound cx sensitivity and decide on abx accordingly. She is currently being discharged on IV vancomycin with HD   Discharge Diagnoses:  Principal Problem:   Bacterial superinfection of shingles lesion  Active Problems:   Herpes zoster keratoconjunctivitis   Sleep apnea on c-pap    Morbid obesity   Chronic venous insufficiency with LE ulcers, will be followed at wound center, currently with wraps   Diastolic dysfunction stage 2 EF 60-65%, with outflow track obstruction Jan 2014   HTN (hypertension)   PAF (paroxysmal atrial fibrillation),    Long term (current) use of anticoagulants   Seizure disorder on dilatin   ESRD on  HD   Type II diabetes mellitus with end-stage renal disease   Discharge Condition:fair  Diet recommendation: renal   Filed Weights   02/10/13 2311 02/11/13 1340  Weight: 89.2 kg (196 lb 10.4 oz) 89.8 kg (197 lb 15.6 oz)    History of present illness:  Please refer  to admission H&P for details, but in brief, 79 y.o. female with Past medical history of diabetes mellitus, hypertension, ESRD now on dialysis with a recent fistula, chronic diastolic dysfunction with LV outflow tract obstruction, A. fib, history of DVT, obstructive sleep apnea on CPAP, admitted  from skilled nursing facility.  Patient was recently hospitalized for acute CHF and was discharged to the nursing home. During that hospitalization she underwent insertion of fistula/dialysis catheter.  In the SNF she complained about lesion on her right side of the face on 7/11. She was started on Valcyclovir 1 g twice a day. She was also recommended to followup with the  ophthalmologist. As far the last note from the nursing home the patient's shingles were getting better, and there was no sign of secondary infection. She was also treated her for a one-week course of prednisone 50 mg and tramadol for pain control.  Patient got her dialysis treatment last time on Thursday. And then she could not get her dialysis treatment in between due to feeling tired and weakness. When she went for the dialysis treatment on 7/29, she had white-yellow discharge coming from shingles lesion, and therefore the dialysis center transfer the patient here.  Pt denies any headache, nausea, vomiting, abdominal pain, diarrhea, constipation, active bleeding, chest pain, palpitation, dizziness. Patient continued on valcyclovir and started on IV vanco and zosyn for superinfection.    Hospital Course:  Bacterial superinfection of zoster lesion  Patient placed on airborne/ droplet  isolation and started empirically on IV vanco and zosyn in addition to valacyclovir. Gm stain from wound from forehead growing GPC in pairs. Seen by ID consult who recommended to d/c valacycovir since she has been on it for a while and lesions seems to have healed while the inflammation and drainage from lesions are secondary to bacterial superinfection she doesnot have any eye pain or visual symptoms. . Recommends to continue IV vancomycin wit HD while cultures are back. based on the sensitivity the choice of abx and duration can be decided as outpt. Zosyn has been discontinued. I have ordered for some topical bacitracin ointment.  patient does not have any active medical issues at present.s he is getting her HD today and can be discharged back to [facility]  after that. Remaining medications are continued. please hold her coumadin today as INR is 3.12. Check INR tomorrow and resume dose appropriately.   Lethargy and sleepiness post HD  patient on multiple meds including oxycodone , tramadol and Ambien at bedtime. also on  low dose xanax bid prn. i will hold her tramadol, oxycodone and Ambien. Please give pain meds judiciously. Dilantin level ordered.    Procedures:  HD   Consultations:  ID ( Dr Orvan Falconer)  Discharge Exam: Filed Vitals:   02/11/13 0634 02/11/13 1340 02/11/13 1353 02/11/13 1400  BP: 125/69 116/62 111/61 127/58  Pulse: 88  85 85  Temp: 97.7 F (36.5 C) 98.6 F (37 C)    TempSrc: Oral Oral    Resp: 20 18 17    Height:      Weight:  89.8 kg (197 lb 15.6 oz)    SpO2: 99%       General: elderly female lying in bed appears fatigued HEENT: healing lesions over  over forehead, no discharge today ,healed zoster lesions over  face Cardiovascular: ns1&s2 No murmurs Respiratory: clear b/l, no added sounds. Rt sided HD catheter Abd: soft, NT, ND, BS+ Ext: warm, no edema  CNS: AAOX3  Discharge Instructions   Future Appointments Provider Department Dept Phone   02/17/2013 9:30 AM [facility] [phone]   02/17/2013 10:45 AM Larina Earthly, MD [facility] [phone]   03/18/2013 11:00 AM Chrystie Nose, MD [facility] [place] [phone]       Medication List    STOP taking these medications       valACYclovir 1000 MG tablet  Commonly known as:  VALTREX    zolpidem 5 MG tablet  Commonly known as:  AMBIEN  Take 1 tablet (5 mg total) by mouth at bedtime as needed for sleep. oxyCODONE 5 MG immediate release tablet  Commonly known as:  Oxy IR/ROXICODONE  Take 5-10 mg by mouth every 2 (two) hours as needed for pain.  traMADol 50 MG tablet  Commonly known as:  ULTRAM  Take 50 mg by mouth every 8 (eight) hours.        TAKE these medications       acetaminophen 325 MG tablet  Commonly known as:  TYLENOL  Take 650 mg by mouth every 4 (four) hours as needed for pain.     allopurinol 300 MG tablet  Commonly known as:  ZYLOPRIM  Take 150 mg by mouth daily.     ALPRAZolam 0.25  MG tablet  Commonly known as:  XANAX  Take 1 tablet (0.25 mg total) by mouth 2 (two) times daily as needed for anxiety.     CERTA PLUS Tabs  Take 1 tablet by mouth at bedtime.     darbepoetin 200 MCG/0.4ML Soln  Commonly known as:  ARANESP  Inject 0.4 mLs (200 mcg total) into the vein every Thursday with hemodialysis.     feeding supplement Liqd  Take 1 Container by mouth 2 (two) times daily between meals.     feeding supplement Liqd  Take 30 mLs by mouth 2 (two) times daily.     Hydrophor Oint  Apply 1 application topically See admin instructions. Apply thin layer after washing face until shingles resolved     insulin aspart protamine- aspart (70-30) 100 UNIT/ML injection  Commonly known as:  NOVOLOG MIX 70/30  Inject 5-10 Units into the skin 2 (two) times daily with a meal. Inject  10 units with breakfast and 5 units with supper     insulin lispro 100 UNIT/ML injection  Commonly known as:  HUMALOG  Inject 2-5 Units into the skin 3 (three) times daily before meals. Sliding scale     JANTOVEN 3 MG tablet  Generic drug:  warfarin  Take 1.5 mg by mouth daily.     loperamide 2 MG capsule  Commonly known as:  IMODIUM  Take 1 capsule (2 mg total) by mouth as needed for diarrhea or loose stools.     nystatin cream  Commonly known as:  MYCOSTATIN  Apply 1 application topically 2 (two) times daily.     omeprazole 20 MG capsule  Commonly known as:  PRILOSEC  Take 20 mg by mouth daily.              phenytoin 100 MG ER capsule  Commonly known as:  DILANTIN  Take 300 mg by mouth at bedtime.     polymixin-bacitracin 500-10000 UNIT/GM Oint ointment  Apply 1 application topically 2 (two) times daily. Over forehead     senna-docusate 8.6-50 MG per tablet  Commonly known as:  Senokot-S  Take 1 tablet by mouth at bedtime as needed for constipation.     simvastatin 10 MG tablet  Commonly known as:  ZOCOR  Take 10 mg by mouth at bedtime.     sodium chloride 2 % ophthalmic  solution  Commonly known as:  MURO 128  Place 2 drops into the right eye 3 (three) times daily.              Vancomycin 750 MG/150ML Soln  Commonly known as:  VANCOCIN  Inject 150 mLs (750 mg total) into the vein Every Tuesday,Thursday,and Saturday with dialysis.  Start taking on:  02/12/2013                No Known Allergies     Follow-up Information   Please follow up. (follow up at [facility])        The results of significant diagnostics from this hospitalization (including imaging, microbiology, ancillary and laboratory) are listed below for reference.    Significant Diagnostic Studies: Dg Chest Port 1 View  02/10/2013   *RADIOLOGY REPORT*  Clinical Data: Shortness of breath.  Current history of diabetes, hypertension, and end-stage renal disease on hemodialysis.  PORTABLE CHEST - 1 VIEW  Comparison: Portable chest x-ray 01/15/2013 dating back to 12/30/2012.  Findings: Cardiac silhouette moderately enlarged but stable. Pulmonary venous hypertension with perhaps minimal interstitial pulmonary edema.  No confluent airspace consolidation.  No visible pleural effusions.  Right jugular dialysis catheter tip projects over the lower SVC at or near the cavoatrial junction. Degenerative changes noted in the right shoulder joint.  IMPRESSION: Stable cardiomegaly.  Pulmonary venous hypertension with perhaps minimal interstitial pulmonary edema.   Original Report Authenticated By: Hulan Saas, M.D.   Dg Chest Port 1 View  01/15/2013   *RADIOLOGY REPORT*  Clinical Data: Central venous catheter placement.  PORTABLE CHEST - 1 VIEW  Comparison: Chest x-ray 01/14/2013.  Findings: There is a left-sided internal jugular central venous catheter with tip terminating in the mid to distal superior vena cava. Right internal jugular PermCath remains in position with the tips terminating in the distal superior vena cava and superior aspect of the right atrium.  Lung volumes are normal.  No acute  consolidative airspace disease.  No pneumothorax.  No pleural effusions.  Mild pulmonary venous congestion, without frank pulmonary edema.  Mild cardiomegaly.  The patient is rotated to the right on today's exam, resulting in distortion of the mediastinal contours and reduced diagnostic sensitivity and specificity for mediastinal pathology.  Atherosclerosis in the thoracic aorta.  IMPRESSION: 1.  Support apparatus, as above. 2.  No pneumothorax following placement of left-sided internal jugular catheter. 3.  Mild cardiomegaly with pulmonary venous congestion, but no frank pulmonary edema.   Original Report Authenticated By: Trudie Reed, M.D.   Dg Chest Port 1 View  01/14/2013   *RADIOLOGY REPORT*  Clinical Data: Shortness of breath.  PORTABLE CHEST - 1 VIEW  Comparison: 01/12/2013.  Findings: Trachea is midline.  Right IJ dialysis catheter tips project over the SVC and SVC/RA junction, respectively.  Heart is enlarged, stable.  Mild bibasilar air space disease, minimally improved from 01/12/2013.  Probable tiny bilateral pleural effusions.  IMPRESSION: Favor minimal improvement in congestive heart failure.   Original Report Authenticated By: Leanna Battles, M.D.    Microbiology: Recent Results (from the past 240 hour(s))  WOUND CULTURE     Status: None   Collection Time    02/10/13  4:28 PM      Result Value Range Status   Specimen Description WOUND HEAD   Final   Special Requests NONE   Final   Gram Stain     Final   Value: ABUNDANT WBC PRESENT, PREDOMINANTLY PMN     NO SQUAMOUS EPITHELIAL CELLS SEEN     MODERATE GRAM POSITIVE COCCI IN PAIRS   Culture Culture reincubated for better growth   Final   Report Status PENDING   Incomplete     Labs: Basic Metabolic Panel:  Recent Labs Lab 02/10/13 1818  NA 134*  K 5.3*  CL 99  GLUCOSE 102*  BUN 42*  CREATININE 2.50*   Liver Function Tests: No results found for this basename: AST, ALT, ALKPHOS, BILITOT, PROT, ALBUMIN,  in the last 168  hours No results found for this basename: LIPASE, AMYLASE,  in the last 168 hours No results found for this basename: AMMONIA,  in the last 168 hours CBC:  Recent Labs Lab 02/10/13 1818 02/10/13 1840  WBC  --  10.5  HGB 15.6* 12.7  HCT 46.0 39.4  MCV  --  104.2*  PLT  --  326   Cardiac Enzymes: No results found for this basename: CKTOTAL, CKMB, CKMBINDEX, TROPONINI,  in the last 168 hours BNP: BNP (last 3 results)  Recent Labs  12/30/12 1652 01/01/13 0345 01/08/13 0445  PROBNP 22049.0* 21019.0* 13502.0*   CBG:  Recent Labs Lab 02/10/13 2243 02/11/13 0809 02/11/13 1139  GLUCAP 79 95 92       Signed:  Alyshia Kernan  [facility] 02/11/2013, 2:10 PM

## 2013-02-11 NOTE — Progress Notes (Signed)
Centralized telemetry notified me that pt had a 12 beat run of PVC's. Pt is currently in Dialysis. Dialysis RN said that pt has been asymptomatic. MD notified.

## 2013-02-11 NOTE — Progress Notes (Signed)
Per Dr. Gonzella Lex, plan to keep pt tonight and postpone discharge to tomorrow. Pt is lethargic post dialysis, had poor PO intake today, and had a low CBG post HD. Husband has been notified of change in discharge plan. Husband already picked up her wheelchair, and will take it to [employer]. Will continue to monitor and encourage PO intake if pt becomes more alert.

## 2013-02-11 NOTE — Progress Notes (Signed)
Utilization review completed.  

## 2013-02-12 DIAGNOSIS — L0291 Cutaneous abscess, unspecified: Secondary | ICD-10-CM

## 2013-02-12 DIAGNOSIS — Z1159 Encounter for screening for other viral diseases: Secondary | ICD-10-CM

## 2013-02-12 LAB — GLUCOSE, CAPILLARY: Glucose-Capillary: 88 mg/dL (ref 70–99)

## 2013-02-12 LAB — RENAL FUNCTION PANEL
Albumin: 2.2 g/dL — ABNORMAL LOW (ref 3.5–5.2)
BUN: 13 mg/dL (ref 6–23)
Phosphorus: 2.8 mg/dL (ref 2.3–4.6)
Potassium: 3.5 mEq/L (ref 3.5–5.1)
Sodium: 139 mEq/L (ref 135–145)

## 2013-02-12 LAB — PROTIME-INR: Prothrombin Time: 31.5 seconds — ABNORMAL HIGH (ref 11.6–15.2)

## 2013-02-12 LAB — CBC
HCT: 37.1 % (ref 36.0–46.0)
MCHC: 32.9 g/dL (ref 30.0–36.0)
RDW: 21.7 % — ABNORMAL HIGH (ref 11.5–15.5)

## 2013-02-12 MED ORDER — LIDOCAINE HCL (PF) 1 % IJ SOLN: 5.0000 mL | INTRAMUSCULAR | Status: DC | PRN

## 2013-02-12 MED ORDER — NEPRO/CARBSTEADY PO LIQD: 237.0000 mL | ORAL | Status: DC | PRN

## 2013-02-12 MED ORDER — ALTEPLASE 2 MG IJ SOLR
2.0000 mg | Freq: Once | INTRAMUSCULAR | Status: DC | PRN
Start: 2013-02-12 — End: 2013-02-12
  Filled 2013-02-12: qty 2

## 2013-02-12 MED ORDER — HEPARIN SODIUM (PORCINE) 1000 UNIT/ML DIALYSIS
2700.0000 [IU] | Freq: Once | INTRAMUSCULAR | Status: AC
  Administered 2013-02-12: 2700 [IU] via INTRAVENOUS_CENTRAL

## 2013-02-12 MED ORDER — SODIUM CHLORIDE 0.9 % IV SOLN: 100.0000 mL | INTRAVENOUS | Status: DC | PRN

## 2013-02-12 MED ORDER — PENTAFLUOROPROP-TETRAFLUOROETH EX AERO: 1.0000 "application " | INHALATION_SPRAY | CUTANEOUS | Status: DC | PRN

## 2013-02-12 MED ORDER — HEPARIN SODIUM (PORCINE) 1000 UNIT/ML DIALYSIS: 1000.0000 [IU] | INTRAMUSCULAR | Status: DC | PRN

## 2013-02-12 MED ORDER — LIDOCAINE-PRILOCAINE 2.5-2.5 % EX CREA: 1.0000 "application " | TOPICAL_CREAM | CUTANEOUS | Status: DC | PRN

## 2013-02-12 MED ORDER — WARFARIN 0.5 MG HALF TABLET
0.5000 mg | ORAL_TABLET | Freq: Once | ORAL | Status: DC
  Filled 2013-02-12: qty 1

## 2013-02-12 MED ORDER — WARFARIN - PHARMACIST DOSING INPATIENT: Freq: Every day | Status: DC

## 2013-02-12 NOTE — Progress Notes (Addendum)
Subjective:  No current complaints, likely discharge after dialysis  Objective: Vital signs in last 24 hours: Temp:  [97.9 F (36.6 C)-98.6 F (37 C)] 98 F (36.7 C) (07/31 0525) Pulse Rate:  [78-94] 90 (07/31 0525) Resp:  [10-23] 18 (07/31 0525) BP: (74-127)/(46-75) 97/55 mmHg (07/31 0525) SpO2:  [92 %-98 %] 92 % (07/31 0525) Weight:  [89 kg (196 lb 3.4 oz)-90.9 kg (200 lb 6.4 oz)] 90.9 kg (200 lb 6.4 oz) (07/31 0525) Weight change: 0.6 kg (1 lb 5.2 oz)  Intake/Output from previous day: 07/30 0701 - 07/31 0700 In: 100 [IV Piggyback:100] Out: 900    Lab Results:  Recent Labs  02/10/13 1840 02/11/13 1410  WBC 10.5 10.0  HGB 12.7 11.9*  HCT 39.4 36.9  PLT 326 306   BMET:  Recent Labs  02/10/13 1818 02/11/13 1410  NA 134* 135  K 5.3* 4.1  CL 99 98  CO2  --  26  GLUCOSE 102* 92  BUN 42* 35*  CREATININE 2.50* 2.37*  CALCIUM  --  8.3*  ALBUMIN  --  2.2*   No results found for this basename: PTH,  in the last 72 hours Iron Studies: No results found for this basename: IRON, TIBC, TRANSFERRIN, FERRITIN,  in the last 72 hours  EXAM: General appearance: Alert, in no apparent distress Resp:  CTA without rales, rhonchi, or wheezes Cardio:  RRR with Gr II/VI systolic murmur, no rub GI: + BS, soft and nontender Extremities:  Trace pedal edema, lower legs with healing wounds (R > L) Access:  R IJ catheter, maturing AVF @ LUA with + bruit   Dialysis Orders: [facility]  4 hrs     92 kg     3K/2.5Ca     Heparin 2700     R IJ catheter  AVF @ LUA (maturing, 6/30 by Dr. Arbie Cookey)  Hectorol 0 Epogen 0 Venofer 50 mg/wk   Assessment/Plan: 1. Recent facial zoster infection / superinfection w GPC- on vanc per ID, isolation d/c'd 2. AMS- pt still confused, stopped prn benzo's and tramadol, dilantin levels looks ok 3. ESRD, resuming TTS HD 4. HTN/Volume- no vol excess, no bp meds, below dry wt a bit   5. Anemia - Hb up, hold epo 6. Sec HPT- ca/phos at goal, pth low, no vit d or  binders 7. Nutrition - Alb 2.2, renal diet, vitamin.  8. DM - on insulin.  9. Seizure disorder - on Dilantin.  10. PAF - on Coumadin.  11. Chronic venous insufficiency - with LE ulcers, healing well; dressings dc'd.     LOS: 2 days   LYLES,CHARLES 02/12/2013,7:23 AM  Patient seen and examined.  I agree with assessment and plan as above with additions as indicated. Vinson Moselle  MD Pager [phone]    Cell  [phone] 02/12/2013, 10:58 AM

## 2013-02-12 NOTE — Progress Notes (Signed)
Hypoglycemic Event  CBG:68  Treatment: 15 GM carbohydrate snack  Symptoms: None  Follow-up CBG: Time:2232 CBG Result:88  Possible Reasons for Event: Inadequate meal intake  Comments/MD notified    Amira Podolak, Sharmon Leyden  Remember to initiate Hypoglycemia Order Set & complete

## 2013-02-12 NOTE — Progress Notes (Signed)
ANTICOAGULATION CONSULT NOTE - Follow Up Consult  Pharmacy Consult for Warfarin Indication: DVT, afib  No Known Allergies  Patient Measurements: Height: 5\' 6"  (167.6 cm) Weight: 197 lb 12 oz (89.7 kg) IBW/kg (Calculated) : 59.3  Vital Signs: Temp: 98.3 F (36.8 C) (07/31 1200) Temp src: Oral (07/31 1200) BP: 110/81 mmHg (07/31 1200) Pulse Rate: 90 (07/31 1200)  Labs:  Recent Labs  02/10/13 1818 02/10/13 1840 02/11/13 1410 02/12/13 0757 02/12/13 1124  HGB 15.6* 12.7 11.9* 12.2  --   HCT 46.0 39.4 36.9 37.1  --   PLT  --  326 306 307  --   LABPROT  --  31.0* 34.7*  --  31.5*  INR  --  3.12* 3.62*  --  3.19*  CREATININE 2.50*  --  2.37* 1.61*  --     Estimated Creatinine Clearance: 33 ml/min (by C-G formula based on Cr of 1.61).   Medications:  Prescriptions prior to admission  Medication Sig Dispense Refill  . acetaminophen (TYLENOL) 325 MG tablet Take 650 mg by mouth every 4 (four) hours as needed for pain.      Marland Kitchen allopurinol (ZYLOPRIM) 300 MG tablet Take 150 mg by mouth daily.      Marland Kitchen ALPRAZolam (XANAX) 0.25 MG tablet Take 1 tablet (0.25 mg total) by mouth 2 (two) times daily as needed for anxiety.  30 tablet  0  . Emollient (HYDROPHOR) OINT Apply 1 application topically See admin instructions. Apply thin layer after washing face until shingles resolved      . feeding supplement (PRO-STAT SUGAR FREE 64) LIQD Take 30 mLs by mouth 2 (two) times daily.      . feeding supplement (RESOURCE BREEZE) LIQD Take 1 Container by mouth 2 (two) times daily between meals.    0  . insulin aspart protamine- aspart (NOVOLOG MIX 70/30) (70-30) 100 UNIT/ML injection Inject 5-10 Units into the skin 2 (two) times daily with a meal. Inject 10 units with breakfast and 5 units with supper      . Multiple Vitamins-Minerals (CERTA PLUS) TABS Take 1 tablet by mouth at bedtime.      Marland Kitchen nystatin cream (MYCOSTATIN) Apply 1 application topically 2 (two) times daily.      Marland Kitchen omeprazole (PRILOSEC) 20  MG capsule Take 20 mg by mouth daily.      . phenytoin (DILANTIN) 100 MG ER capsule Take 300 mg by mouth at bedtime.      . sodium chloride (MURO 128) 2 % ophthalmic solution Place 2 drops into the right eye 3 (three) times daily.      Marland Kitchen warfarin (JANTOVEN) 3 MG tablet Take 1.5 mg by mouth daily.      . [DISCONTINUED] oxyCODONE (OXY IR/ROXICODONE) 5 MG immediate release tablet Take 5-10 mg by mouth every 2 (two) hours as needed for pain.      . darbepoetin (ARANESP) 200 MCG/0.4ML SOLN Inject 0.4 mLs (200 mcg total) into the vein every Thursday with hemodialysis.  1.68 mL    . insulin lispro (HUMALOG) 100 UNIT/ML injection Inject 2-5 Units into the skin 3 (three) times daily before meals. Sliding scale      . loperamide (IMODIUM) 2 MG capsule Take 1 capsule (2 mg total) by mouth as needed for diarrhea or loose stools.  30 capsule  0  . senna-docusate (SENOKOT-S) 8.6-50 MG per tablet Take 1 tablet by mouth at bedtime as needed for constipation.      . simvastatin (ZOCOR) 10 MG tablet Take 10  mg by mouth at bedtime.      . [DISCONTINUED] traMADol (ULTRAM) 50 MG tablet Take 50 mg by mouth every 8 (eight) hours.      . [DISCONTINUED] valACYclovir (VALTREX) 1000 MG tablet Take 1,000 mg by mouth 2 (two) times daily.       . [DISCONTINUED] zolpidem (AMBIEN) 5 MG tablet Take 1 tablet (5 mg total) by mouth at bedtime as needed for sleep.  30 tablet  0    Assessment: 79 y/o F with h/o afib and DVT on warfarin at SNF. CHAD2=4. INR at admission was 3.12 on warfarin 1.5mg  daily . INR today is 3.19<3.62. CBC low and stable. Renal function improving with Scr 1.61<2.37.   Goal of Therapy:  INR 2-3 Monitor platelets by anticoagulation protocol: Yes   Plan:  -Warfarin 0.5 mg PO x 1 tonight -Daily PT/INR -Monitor for bleeding  Thank you for allowing me to take part in this patient's care,  Abran Duke, PharmD Clinical Pharmacist Phone: [phone] Pager: [phone] 02/12/2013 1:02 PM

## 2013-02-12 NOTE — Procedures (Signed)
I was present at this dialysis session. I have reviewed the session itself and made appropriate changes.   Rob Melquan Ernsberger  MD Pager [phone]    Cell  [phone] 02/12/2013, 10:44 AM    

## 2013-02-12 NOTE — Progress Notes (Signed)
[facility] PROGRESS NOTE  [name] WUJ:[id number] DOB: 04-01-1934 DOA: 02/10/2013 PCP: Alva Garnet., MD  Assessment/Plan: Bacterial superinfection of zoster lesion  Patient placed on airborne/ droplet isolation and started empirically on IV vanco and zosyn in addition to valacyclovir. Gm stain from wound from forehead growing GPC in pairs. Seen by ID consult who recommended to d/c valacycovir since she has been on it for a while and lesions seems to have healed while the inflammation and drainage from lesions are secondary to bacterial superinfection she doesnot have any eye pain or visual symptoms. . Recommends to continue IV vancomycin with HD while cultures are back. based on the sensitivity the choice of abx and duration can be decided as outpt. Continue  topical bacitracin ointment.  patient does not have any active medical issues at present. Received further HD today.   Lesions over forehead are healing. No drainage today.  INR pending. Dose couamdin appropriately. Held yesterday.  Ready for d/c to SNF   Code Status: full Family Communication: NONE Disposition Plan:d/c to [facility] place   Consultants:  ID  Procedures:  none  Antibiotics:  IV vanco (7/30)  HPI/Subjective: Discharge held off yesterday as pt was   Objective: Filed Vitals:   02/12/13 0900 02/12/13 0930 02/12/13 1000 02/12/13 1030  BP: 97/71 124/68 122/73 113/71  Pulse: 70 101 102 102  Temp:      TempSrc:      Resp: 18 16 16 16   Height:      Weight:      SpO2:        Intake/Output Summary (Last 24 hours) at 02/12/13 1041 Last data filed at 02/11/13 1809  Gross per 24 hour  Intake    100 ml  Output    900 ml  Net   -800 ml   Filed Weights   02/11/13 2118 02/12/13 0525 02/12/13 0733  Weight: 89.3 kg (196 lb 13.9 oz) 90.9 kg (200 lb 6.4 oz) 89.7 kg (197 lb 12 oz)    Exam:  General: elderly female lying in bed appears fatigued  HEENT: crusted healed  lesions over  forehead,no discharge, healed zoster lesions over face  Cardiovascular: ns1&s2 No murmurs  Respiratory:clear  b/l, no added sounds. Rt sided HD catheter  Abd: soft, NT, ND, BS+  Ext: warm, no edema  CNS: AAOX3   Data Reviewed: Basic Metabolic Panel:  Recent Labs Lab 02/10/13 1818 02/11/13 1410 02/12/13 0757  NA 134* 135 139  K 5.3* 4.1 3.5  CL 99 98 101  CO2  --  26 27  GLUCOSE 102* 92 105*  BUN 42* 35* 13  CREATININE 2.50* 2.37* 1.61*  CALCIUM  --  8.3* 8.1*  MG  --  1.9  --   PHOS  --  4.1 2.8   Liver Function Tests:  Recent Labs Lab 02/11/13 1410 02/12/13 0757  AST 57*  --   ALT 58*  --   ALKPHOS 107  --   BILITOT 1.1  --   PROT 5.0*  --   ALBUMIN 2.2* 2.2*   No results found for this basename: LIPASE, AMYLASE,  in the last 168 hours No results found for this basename: AMMONIA,  in the last 168 hours CBC:  Recent Labs Lab 02/10/13 1818 02/10/13 1840 02/11/13 1410 02/12/13 0757  WBC  --  10.5 10.0 10.7*  NEUTROABS  --   --  7.8*  --   HGB 15.6* 12.7 11.9* 12.2  HCT 46.0 39.4 36.9 37.1  MCV  --  104.2* 103.9* 103.9*  PLT  --  326 306 307   Cardiac Enzymes: No results found for this basename: CKTOTAL, CKMB, CKMBINDEX, TROPONINI,  in the last 168 hours BNP (last 3 results)  Recent Labs  12/30/12 1652 01/01/13 0345 01/08/13 0445  PROBNP 22049.0* 21019.0* 13502.0*   CBG:  Recent Labs Lab 02/11/13 1139 02/11/13 1821 02/11/13 1831 02/11/13 2122 02/11/13 2232  GLUCAP 92 57* 92 68* 88    Recent Results (from the past 240 hour(s))  WOUND CULTURE     Status: None   Collection Time    02/10/13  4:28 PM      Result Value Range Status   Specimen Description WOUND HEAD   Final   Special Requests NONE   Final   Gram Stain     Final   Value: ABUNDANT WBC PRESENT, PREDOMINANTLY PMN     NO SQUAMOUS EPITHELIAL CELLS SEEN     MODERATE GRAM POSITIVE COCCI IN PAIRS   Culture Culture reincubated for better growth   Final   Report Status PENDING    Incomplete     Studies: Dg Chest Port 1 View  02/10/2013   *RADIOLOGY REPORT*  Clinical Data: Shortness of breath.  Current history of diabetes, hypertension, and end-stage renal disease on hemodialysis.  PORTABLE CHEST - 1 VIEW  Comparison: Portable chest x-ray 01/15/2013 dating back to 12/30/2012.  Findings: Cardiac silhouette moderately enlarged but stable. Pulmonary venous hypertension with perhaps minimal interstitial pulmonary edema.  No confluent airspace consolidation.  No visible pleural effusions.  Right jugular dialysis catheter tip projects over the lower SVC at or near the cavoatrial junction. Degenerative changes noted in the right shoulder joint.  IMPRESSION: Stable cardiomegaly.  Pulmonary venous hypertension with perhaps minimal interstitial pulmonary edema.   Original Report Authenticated By: Hulan Saas, M.D.    Scheduled Meds: . allopurinol  150 mg Oral Daily  . insulin aspart  0-15 Units Subcutaneous TID WC  . insulin aspart  0-5 Units Subcutaneous QHS  . insulin aspart protamine- aspart  5 Units Subcutaneous BID WC  . phenytoin  300 mg Oral QHS  . simvastatin  10 mg Oral QHS  . sodium chloride  1 drop Both Eyes TID  . sodium chloride  3 mL Intravenous Q12H  . traMADol  50 mg Oral Q8H  . vancomycin  750 mg Intravenous Q T,Th,Sa-HD  . Warfarin - Physician Dosing Inpatient   Does not apply q1800   Continuous Infusions:     Time spent: 25 minutes    Amiee Wiley  [facility] Pager [phone] If 7PM-7AM, please contact night-coverage at [email or web address], password [facility] 02/12/2013, 10:41 AM  LOS: 2 days

## 2013-02-12 NOTE — Discharge Summary (Signed)
Pt d/c'd to [facility] per ambulance. D/c summary with EMS. Report called to Mckinley Jewel at [facility]. Pt belongings with EMS. No tele box.  Marland Kitchen

## 2013-02-12 NOTE — Progress Notes (Signed)
[facility]  Day # 3 vancomycin  Subjective: No new complaints   Antibiotics:  Anti-infectives   Start     Dose/Rate Route Frequency Ordered Stop   02/12/13 1200  vancomycin (VANCOCIN) IVPB 750 mg/150 ml premix     750 mg 150 mL/hr over 60 Minutes Intravenous Every T-Th-Sa (Hemodialysis) 02/10/13 2325     02/12/13 0000  Vancomycin (VANCOCIN) 750 MG/150ML SOLN    Comments:  Until 8/10   750 mg 150 mL/hr over 60 Minutes Intravenous Every T-Th-Sa (Hemodialysis) 02/11/13 1409     02/11/13 1000  valACYclovir (VALTREX) tablet 500 mg  Status:  Discontinued     500 mg Oral 2 times daily 02/11/13 0804 02/11/13 1244   02/11/13 0945  vancomycin (VANCOCIN) 500 mg in sodium chloride 0.9 % 100 mL IVPB     500 mg 100 mL/hr over 60 Minutes Intravenous  Once 02/11/13 0931 02/11/13 1909   02/11/13 0100  piperacillin-tazobactam (ZOSYN) IVPB 2.25 g  Status:  Discontinued     2.25 g 100 mL/hr over 30 Minutes Intravenous 3 times per day 02/11/13 0059 02/11/13 1244   02/10/13 2359  vancomycin (VANCOCIN) 1,500 mg in sodium chloride 0.9 % 500 mL IVPB     1,500 mg 250 mL/hr over 120 Minutes Intravenous  Once 02/10/13 2325 02/11/13 0539   02/10/13 2345  valACYclovir (VALTREX) tablet 1,000 mg  Status:  Discontinued     1,000 mg Oral 2 times daily 02/10/13 2337 02/11/13 0804      Medications: Scheduled Meds: . allopurinol  150 mg Oral Daily  . insulin aspart  0-15 Units Subcutaneous TID WC  . insulin aspart  0-5 Units Subcutaneous QHS  . insulin aspart protamine- aspart  5 Units Subcutaneous BID WC  . phenytoin  300 mg Oral QHS  . simvastatin  10 mg Oral QHS  . sodium chloride  1 drop Both Eyes TID  . sodium chloride  3 mL Intravenous Q12H  . vancomycin  750 mg Intravenous Q T,Th,Sa-HD  . warfarin  0.5 mg Oral ONCE-1800  . Warfarin - Pharmacist Dosing Inpatient   Does not apply q1800   Continuous Infusions:  PRN Meds:.acetaminophen, oxyCODONE,  senna-docusate   Objective: Weight change: 1 lb 5.2 oz (0.6 kg)  Intake/Output Summary (Last 24 hours) at 02/12/13 1344 Last data filed at 02/12/13 1200  Gross per 24 hour  Intake    100 ml  Output    898 ml  Net   -798 ml   Blood pressure 110/81, pulse 90, temperature 98.3 F (36.8 C), temperature source Oral, resp. rate 22, height 5\' 6"  (1.676 m), weight 197 lb 12 oz (89.7 kg), SpO2 98.00%. Temp:  [97.9 F (36.6 C)-98.3 F (36.8 C)] 98.3 F (36.8 C) (07/31 1200) Pulse Rate:  [70-106] 90 (07/31 1200) Resp:  [10-23] 22 (07/31 1200) BP: (74-131)/(46-81) 110/81 mmHg (07/31 1200) SpO2:  [92 %-98 %] 98 % (07/31 1200) Weight:  [196 lb 3.4 oz (89 kg)-200 lb 6.4 oz (90.9 kg)] 197 lb 12 oz (89.7 kg) (07/31 1200)  Physical Exam: General: Alert and awake, oriented x3, not in any acute distress. HEENT: She has scars over her right scalp forehead nose and cheeks compatible with recent zoster  CVS regular rate, normal r,  no murmur rubs or gallops Agdomen: nontender, soft Neuro: nonfocal  Lab Results:  Recent Labs  02/11/13 1410 02/12/13 0757  WBC 10.0 10.7*  HGB 11.9* 12.2  HCT 36.9 37.1  PLT 306  307    BMET  Recent Labs  02/11/13 1410 02/12/13 0757  NA 135 139  K 4.1 3.5  CL 98 101  CO2 26 27  GLUCOSE 92 105*  BUN 35* 13  CREATININE 2.37* 1.61*  CALCIUM 8.3* 8.1*    Micro Results: Recent Results (from the past 240 hour(s))  WOUND CULTURE     Status: None   Collection Time    02/10/13  4:28 PM      Result Value Range Status   Specimen Description WOUND HEAD   Final   Special Requests NONE   Final   Gram Stain     Final   Value: ABUNDANT WBC PRESENT, PREDOMINANTLY PMN     NO SQUAMOUS EPITHELIAL CELLS SEEN     MODERATE GRAM POSITIVE COCCI IN PAIRS   Culture Culture reincubated for better growth   Final   Report Status PENDING   Incomplete    Studies/Results: Dg Chest Port 1 View  02/10/2013   *RADIOLOGY REPORT*  Clinical Data: Shortness of breath.   Current history of diabetes, hypertension, and end-stage renal disease on hemodialysis.  PORTABLE CHEST - 1 VIEW  Comparison: Portable chest x-ray 01/15/2013 dating back to 12/30/2012.  Findings: Cardiac silhouette moderately enlarged but stable. Pulmonary venous hypertension with perhaps minimal interstitial pulmonary edema.  No confluent airspace consolidation.  No visible pleural effusions.  Right jugular dialysis catheter tip projects over the lower SVC at or near the cavoatrial junction. Degenerative changes noted in the right shoulder joint.  IMPRESSION: Stable cardiomegaly.  Pulmonary venous hypertension with perhaps minimal interstitial pulmonary edema.   Original Report Authenticated By: Hulan Saas, M.D.      Assessment/Plan: [name] is a 79 y.o. female with  Superinfection of recent zoster outbreak  # 1 Superinfection of zoster area: appears to be stable to improving on IV vancomycin.  --continue vancomycin and I will followup cultures  #2 Screening: check HIV    LOS: 2 days   Acey Lav 02/12/2013, 1:44 PM

## 2013-02-12 NOTE — Care Management Note (Signed)
   CARE MANAGEMENT NOTE 02/12/2013  Patient:  [name], [name]   Account Number:  [id number]  Date Initiated:  02/11/2013  Documentation initiated by:  Darlyne Russian  Subjective/Objective Assessment:   Patient admitted with purulent drainage, history of shingles right side of face.  Medical hx: ESRD/HD, bilateral LE unna boots, DM.  Admitted from SNF     Action/Plan:   Progression of care and discharge planning   Anticipated DC Date:  02/12/2013   Anticipated DC Plan:  SKILLED NURSING FACILITY         Choice offered to / List presented to:             Status of service:  Completed, signed off Medicare Important Message given?   (If response is "NO", the following Medicare IM given date fields will be blank) Date Medicare IM given:   Date Additional Medicare IM given:    Discharge Disposition:  SKILLED NURSING FACILITY  Per UR Regulation:  Reviewed for med. necessity/level of care/duration of stay  If discussed at Long Length of Stay Meetings, dates discussed:    Comments:

## 2013-02-13 ENCOUNTER — Other Ambulatory Visit: Payer: Self-pay | Admitting: Geriatric Medicine

## 2013-02-13 MED ORDER — OXYCODONE HCL 5 MG PO TABS: ORAL_TABLET | ORAL | Status: AC

## 2013-02-15 LAB — WOUND CULTURE

## 2013-02-16 ENCOUNTER — Encounter: Payer: Self-pay | Admitting: Vascular Surgery

## 2013-02-16 ENCOUNTER — Non-Acute Institutional Stay (SKILLED_NURSING_FACILITY): Payer: Medicare Other | Admitting: Internal Medicine

## 2013-02-16 DIAGNOSIS — E1122 Type 2 diabetes mellitus with diabetic chronic kidney disease: Secondary | ICD-10-CM

## 2013-02-16 DIAGNOSIS — R531 Weakness: Secondary | ICD-10-CM

## 2013-02-16 DIAGNOSIS — I10 Essential (primary) hypertension: Secondary | ICD-10-CM

## 2013-02-16 DIAGNOSIS — R5381 Other malaise: Secondary | ICD-10-CM

## 2013-02-16 DIAGNOSIS — L0889 Other specified local infections of the skin and subcutaneous tissue: Secondary | ICD-10-CM

## 2013-02-16 DIAGNOSIS — N184 Chronic kidney disease, stage 4 (severe): Secondary | ICD-10-CM

## 2013-02-16 DIAGNOSIS — E1129 Type 2 diabetes mellitus with other diabetic kidney complication: Secondary | ICD-10-CM

## 2013-02-16 DIAGNOSIS — N186 End stage renal disease: Secondary | ICD-10-CM

## 2013-02-16 NOTE — Progress Notes (Signed)
Patient ID: [name], female   DOB: Apr 10, 1934, 79 y.o.   MRN: [id number]  [facility]  PCP: Alva Garnet., MD  Code Status: full code  No Known Allergies  Chief Complaint  Patient presents with  . Hospitalization Follow-up    HPI:  79 y.o. female with history of diabetes mellitus, hypertension, ESRD now on dialysis with a recent fistula, chronic diastolic dysfunction with LV outflow tract obstruction, A. fib, history of DVT, obstructive sleep apnea on CPAP was here for STR. She developed shingles lesion on right side of her face and was started on valcyclovir, prednisone and tramadol and her lesions were improving. She had gone for dialysis where it was noted for her to have yellow discharge from few of the crusted lesions and she was sent to ED to get her dialysis done under contact/droplet isolation. She was empirically started on vanc and zosyn and her valcyclovir was continued. ID was consulted. Her valcyclovir was discontinued with course completion. Her zosyn was discontinued and her vancomycin is to be continue with dialysis. She was then sent back to the facility and was seen in her room. She is in no distress and denies any complaints Treatment nurse has noted her to have weeping lesions in both her legs. Pt mentions having this before and being seen in wound centre. Denies any leg pain or muscle cramps  Review of Systems:  Constitutional: Negative for fever, chills and diaphoresis.   HENT: Negative for congestion.    Respiratory: Negative for cough and shortness of breath.    Cardiovascular: Negative for chest pain and palpitations.   Gastrointestinal: Negative for heartburn, nausea, vomiting, abdominal pain and constipation.   Genitourinary: Negative for dysuria.   Musculoskeletal: Negative for joint pain.    Neurological: Positive for weakness. Negative for dizziness and headaches.  Psychiatric/Behavioral: Negative for depression and memory loss.    Past  Medical History  Diagnosis Date  . Hypertension   . Venous stasis of lower extremity WEARS TEDS    BLE--  CURRENTLY NO ULCERS/ WOUNDS  . Aortic insufficiency     mild/H&P 04/02/2012  . Type II diabetes mellitus   . Pulmonary hypertension per last echo 03-28-2012  mod.-severe     per dr wert note this related to L ht pressures from diastolic chf  . Hypertrophic obstructive cardiomyopathy(425.11)   . Arthritis   . CKD (chronic kidney disease) stage 3, GFR 30-59 ml/min   . Heart murmur   . History of DVT of lower extremity lower right leg nov 2012  . Hyperlipidemia   . History of gout 06-09-2012  per pt stable  . Chronic diastolic CHF (congestive heart failure), NYHA class 3 CARDIOLOGIST- DR HILTY--  LOV OCT 2013--  REQUESTED NOTE, EGK AND STRESS TEST    PER PT ON 06-09-2012  S&S OF CHF AND WT IS DOWN  . H/O hiatal hernia   . GERD (gastroesophageal reflux disease)   . Lower extremity edema   . PAF (paroxysmal atrial fibrillation), in SR on admit 09/25/2012  . TIA (transient ischemic attack) 08/07/2012    2D Echo - EF 60-65, severe concentric hypertrophy in the left ventricle  . Dyspnea on exertion 11/30/2010    Gated Stress Dipyridamole Myocardial Perfusion - post stress EF-63, normal study, no ischemia demonstrated   . Hematoma, lt upper arm 01/23/2013  . OSA on CPAP   . Stroke Jan 2014    Rt brain, Nl CA doppler   Past Surgical History  Procedure Laterality Date  . Total knee arthroplasty  1988; 1998 X2    right; bilateral  . Tumor excision  1970's    "off my stomach"  . Cataract extraction w/ intraocular lens implant  ~ 2010    right; "put lens in; didn't work; had mass on my eye"  . Transthoracic echocardiogram  04-03-2012   DR KENNETH HILTY    LVSF  EF 80-85%/ HYPERTRONIC CARDIOMYOPATHY WITH MOSTLY MID-CAVITARY OBSTRUCTION AND A SMALLER COMPONENT OF LV OUTFLOW TRACT OBSTRUCTION/   EVIDENCE OF SEVERE DECOMPRESATION OF DIASTOLIC HEART FAILURE AND MODERATE TO SEVERE PULMONARY  ARTERIAL HYPERTENSION  . Joint replacement  '88 and '98    TOTAL RIGHT KNEE X2  1988  &  1998 (DUE TO JOINT MALFUNCTION)   TOTAL LEFT KNEE 1998  . Carpometacarpel suspension plasty  06/16/2012    Procedure: CARPOMETACARPEL [facility]) SUSPENSION PLASTY;  Surgeon: Jodi Marble, MD;  Location: [facility];  Service: Orthopedics;  Laterality: Right;  RIGHT THUMB SUSPENSION ARTHROPLASTY.  Marland Kitchen Eye surgery Right ~ 2011    "not a cataract; just a growth" (09/26/2012)  . Cardiac catheterization Bilateral 01/19/2004    NL cors. neg Nuc 2012  . Av fistula placement N/A 01/12/2013    Procedure: ARTERIOVENOUS (AV) FISTULA CREATION;  Surgeon: Larina Earthly, MD;  Location: [facility] OR;  Service: Vascular;  Laterality: N/A;  . Insertion of dialysis catheter N/A 01/12/2013    Procedure: INSERTION OF DIALYSIS CATHETER;  Surgeon: Larina Earthly, MD;  Location: [facility] OR;  Service: Vascular;  Laterality: N/A;   Social History:   reports that she has never smoked. She has never used smokeless tobacco. She reports that she does not drink alcohol or use illicit drugs.  Family History  Problem Relation Age of Onset  . Other Mother   . Coronary artery disease Father     sudden death in his 35's  . Diabetes Father   . Cancer Father   . Diabetes Sister     Medications: Patient's Medications  New Prescriptions   No medications on file  Previous Medications   ACETAMINOPHEN (TYLENOL) 325 MG TABLET    Take 650 mg by mouth every 4 (four) hours as needed for pain.   ALLOPURINOL (ZYLOPRIM) 300 MG TABLET    Take 150 mg by mouth daily.   ALPRAZOLAM (XANAX) 0.25 MG TABLET    Take 1 tablet (0.25 mg total) by mouth 2 (two) times daily as needed for anxiety.   DARBEPOETIN (ARANESP) 200 MCG/0.4ML SOLN    Inject 0.4 mLs (200 mcg total) into the vein every Thursday with hemodialysis.   EMOLLIENT (HYDROPHOR) OINT    Apply 1 application topically See admin instructions. Apply thin layer after washing face until shingles resolved    FEEDING SUPPLEMENT (PRO-STAT SUGAR FREE 64) LIQD    Take 30 mLs by mouth 2 (two) times daily.   FEEDING SUPPLEMENT (RESOURCE BREEZE) LIQD    Take 1 Container by mouth 2 (two) times daily between meals.   INSULIN ASPART PROTAMINE- ASPART (NOVOLOG MIX 70/30) (70-30) 100 UNIT/ML INJECTION    Inject 5-10 Units into the skin 2 (two) times daily with a meal. Inject 10 units with breakfast and 5 units with supper   INSULIN LISPRO (HUMALOG) 100 UNIT/ML INJECTION    Inject 2-5 Units into the skin 3 (three) times daily before meals. Sliding scale   LOPERAMIDE (IMODIUM) 2 MG CAPSULE    Take 1 capsule (2 mg total) by mouth as needed for  diarrhea or loose stools.   MULTIPLE VITAMINS-MINERALS (CERTA PLUS) TABS    Take 1 tablet by mouth at bedtime.   NYSTATIN CREAM (MYCOSTATIN)    Apply 1 application topically 2 (two) times daily.   OMEPRAZOLE (PRILOSEC) 20 MG CAPSULE    Take 20 mg by mouth daily.   OXYCODONE (OXY IR/ROXICODONE) 5 MG IMMEDIATE RELEASE TABLET    Take one tablet by mouth every 2 hours as needed for mild pain; take 2 tablets by mouth every 2 hours as needed for moderate pain.   PHENYTOIN (DILANTIN) 100 MG ER CAPSULE    Take 300 mg by mouth at bedtime.   POLYMIXIN-BACITRACIN (POLYSPORIN) 500-10000 UNIT/GM OINT OINTMENT    Apply 1 application topically 2 (two) times daily. Over forehead   SENNA-DOCUSATE (SENOKOT-S) 8.6-50 MG PER TABLET    Take 1 tablet by mouth at bedtime as needed for constipation.   SIMVASTATIN (ZOCOR) 10 MG TABLET    Take 10 mg by mouth at bedtime.   SODIUM CHLORIDE (MURO 128) 2 % OPHTHALMIC SOLUTION    Place 2 drops into the right eye 3 (three) times daily.   VANCOMYCIN (VANCOCIN) 750 MG/150ML SOLN    Inject 150 mLs (750 mg total) into the vein Every Tuesday,Thursday,and Saturday with dialysis.   WARFARIN (JANTOVEN) 3 MG TABLET    Take 1.5 mg by mouth daily.  Modified Medications   No medications on file  Discontinued Medications   No medications on file     Physical  Exam: Filed Vitals:   02/16/13 1558  BP: 103/63  Pulse: 79  Temp: 98 F (36.7 C)  Resp: 18  SpO2: 97%   General: morbidly obese, in NAD, pleasant Skin:left upper arm fistula area dry and clean, dialysis port on right side of the chest clean , has dried skin area with scarring and some crusted lesion on right side of her face HEENT:normocephalic, mucus membranes moist , PERRLA Neck:supple, no JVD, no bruits   Heart:S1S2 RRR with 2/6 systolic murmur, no gallup, rub or click   Lungs:clear without rales, rhonchi, or wheezes   ZOX:WRUE, non tender, BS+ Ext:no LE edema, legs wrapped for weeping lesions, ulcers have healed Neuro:alert and oriented x 3, no focal deficit   Labs reviewed: Basic Metabolic Panel:  Recent Labs  45/40/98 0500  01/06/13 0420  01/22/13 1118 01/23/13 0533 02/10/13 1818 02/11/13 1410 02/12/13 0757  NA 136  < > 137  < > 137 137 134* 135 139  K 4.7  < > 4.5  < > 4.0 3.4* 5.3* 4.1 3.5  CL 103  < > 102  < > 100 101 99 98 101  CO2 27  < > 27  < > 28 28  --  26 27  GLUCOSE 101*  < > 106*  < > 70 116* 102* 92 105*  BUN 19  < > 18  < > 30* 15 42* 35* 13  CREATININE 1.27*  < > 1.14*  < > 2.15* 1.56* 2.50* 2.37* 1.61*  CALCIUM 8.1*  < > 8.6  < > 8.5 8.0*  --  8.3* 8.1*  MG 2.3  --  2.2  --   --   --   --  1.9  --   PHOS 2.5  < > 2.0*  < > 2.8  --   --  4.1 2.8  < > = values in this interval not displayed. Liver Function Tests: CBC:  Recent Labs  12/05/12 1855  12/30/12 1652  02/10/13  1840 02/11/13 1410 02/12/13 0757  WBC 9.2  < > 8.6  < > 10.5 10.0 10.7*  NEUTROABS 7.4  --  6.4  --   --  7.8*  --   HGB 12.0  < > 12.9  < > 12.7 11.9* 12.2  HCT 37.0  < > 38.8  < > 39.4 36.9 37.1  MCV 94.9  < > 93.3  < > 104.2* 103.9* 103.9*  PLT 236  < > 205  < > 326 306 307  < > = values in this interval not displayed. Cardiac Enzymes:  Recent Labs  12/30/12 2115 12/31/12 0440 12/31/12 0930  TROPONINI <0.30 <0.30 <0.30   CBG:  Recent Labs  02/11/13 2232  02/12/13 1248 02/12/13 1701  GLUCAP 88 77 77    Assessment/Plan  Assessment/Plan  Generalized weakness- s/p chronic medical co-morbidities. She will need to work with therapy for muscle strengthening exercise and gait training. Continue diabetic and renal diet, feeding supplement. Continue wound care  Bacterial superinfection of shingle lesions- continue vancomycin for now at dialysis. Reviewed c/s report with sensitivity to vancomycin. Also reviewed ID consult note and will have facility follow with his clinic on duration for vancomycin  Anemia of chronic disease- continue aransep, check cbc at dialysis  DM- monitor cbg, continue current regimen of insulin, statin  afib- rate under control. Continue coumadin for anticoagulation . Goal inr 2-3.   ESRD- s/p dialysis 3/week.   Anxiety- continue xanax for now  Seizure- remains seizure free, continue dilantin and monitor clinically  Chronic venous insufficiency- s/p wraps in both LE and has weeping areas, ulcers have healed,  continue wound care  Sleep apnea- not using CPAP, on o2 by nasal canula   Family/ staff Communication: reviewed care plan with patient and nursing supervisor

## 2013-02-17 ENCOUNTER — Ambulatory Visit: Payer: Medicare Other | Admitting: Vascular Surgery

## 2013-03-11 ENCOUNTER — Non-Acute Institutional Stay (SKILLED_NURSING_FACILITY): Payer: Medicare Other | Admitting: Nurse Practitioner

## 2013-03-11 DIAGNOSIS — Z7901 Long term (current) use of anticoagulants: Secondary | ICD-10-CM

## 2013-03-18 ENCOUNTER — Ambulatory Visit: Payer: Medicare Other | Admitting: Cardiology

## 2013-03-18 ENCOUNTER — Ambulatory Visit: Payer: Medicare Other | Admitting: Internal Medicine

## 2013-03-18 ENCOUNTER — Telehealth: Payer: Self-pay | Admitting: Internal Medicine

## 2013-03-18 NOTE — Telephone Encounter (Signed)
Pt saw Dr Rennis Golden this morning-just checking to see if there was any new orders?

## 2013-03-18 NOTE — Telephone Encounter (Signed)
Returned call from [place].  Informed pt arrived and left w/o being seen.  Verbalized understanding and stated she will f/u with pt's husband.

## 2013-03-26 ENCOUNTER — Encounter: Payer: Self-pay | Admitting: Vascular Surgery

## 2013-03-27 ENCOUNTER — Non-Acute Institutional Stay (SKILLED_NURSING_FACILITY): Payer: Medicare Other | Admitting: Nurse Practitioner

## 2013-03-27 ENCOUNTER — Ambulatory Visit: Payer: Medicare Other | Admitting: Vascular Surgery

## 2013-03-27 DIAGNOSIS — E1129 Type 2 diabetes mellitus with other diabetic kidney complication: Secondary | ICD-10-CM

## 2013-03-27 DIAGNOSIS — E1122 Type 2 diabetes mellitus with diabetic chronic kidney disease: Secondary | ICD-10-CM

## 2013-03-27 DIAGNOSIS — I872 Venous insufficiency (chronic) (peripheral): Secondary | ICD-10-CM

## 2013-03-27 DIAGNOSIS — Z7901 Long term (current) use of anticoagulants: Secondary | ICD-10-CM

## 2013-03-27 DIAGNOSIS — R5381 Other malaise: Secondary | ICD-10-CM

## 2013-03-27 DIAGNOSIS — N186 End stage renal disease: Secondary | ICD-10-CM

## 2013-03-27 DIAGNOSIS — N184 Chronic kidney disease, stage 4 (severe): Secondary | ICD-10-CM

## 2013-03-27 DIAGNOSIS — R531 Weakness: Secondary | ICD-10-CM

## 2013-03-30 ENCOUNTER — Telehealth: Payer: Self-pay | Admitting: Pharmacist Clinician (PhC)/ Clinical Pharmacy Specialist

## 2013-03-30 ENCOUNTER — Ambulatory Visit: Payer: Self-pay | Admitting: Pharmacist Clinician (PhC)/ Clinical Pharmacy Specialist

## 2013-03-30 ENCOUNTER — Ambulatory Visit (INDEPENDENT_AMBULATORY_CARE_PROVIDER_SITE_OTHER): Payer: Medicare Other | Admitting: Pharmacist Clinician (PhC)/ Clinical Pharmacy Specialist

## 2013-03-30 DIAGNOSIS — I4891 Unspecified atrial fibrillation: Secondary | ICD-10-CM

## 2013-03-30 DIAGNOSIS — I639 Cerebral infarction, unspecified: Secondary | ICD-10-CM

## 2013-03-30 DIAGNOSIS — I480 Paroxysmal atrial fibrillation: Secondary | ICD-10-CM

## 2013-03-30 DIAGNOSIS — I6350 Cerebral infarction due to unspecified occlusion or stenosis of unspecified cerebral artery: Secondary | ICD-10-CM

## 2013-03-30 DIAGNOSIS — Z7901 Long term (current) use of anticoagulants: Secondary | ICD-10-CM

## 2013-03-30 LAB — POCT INR: INR: 6.1

## 2013-03-30 NOTE — Telephone Encounter (Signed)
Pt was released from rehab without Coumadin instructions.

## 2013-03-31 ENCOUNTER — Emergency Department (HOSPITAL_COMMUNITY)
Admission: EM | Admit: 2013-03-31 | Discharge: 2013-04-15 | Disposition: E | Payer: Medicare Other | Attending: Emergency Medicine | Admitting: Emergency Medicine

## 2013-03-31 DIAGNOSIS — R142 Eructation: Secondary | ICD-10-CM | POA: Insufficient documentation

## 2013-03-31 DIAGNOSIS — Z9981 Dependence on supplemental oxygen: Secondary | ICD-10-CM | POA: Insufficient documentation

## 2013-03-31 DIAGNOSIS — G4733 Obstructive sleep apnea (adult) (pediatric): Secondary | ICD-10-CM | POA: Insufficient documentation

## 2013-03-31 DIAGNOSIS — E785 Hyperlipidemia, unspecified: Secondary | ICD-10-CM | POA: Insufficient documentation

## 2013-03-31 DIAGNOSIS — I4891 Unspecified atrial fibrillation: Secondary | ICD-10-CM | POA: Insufficient documentation

## 2013-03-31 DIAGNOSIS — Z87828 Personal history of other (healed) physical injury and trauma: Secondary | ICD-10-CM | POA: Insufficient documentation

## 2013-03-31 DIAGNOSIS — Z9861 Coronary angioplasty status: Secondary | ICD-10-CM | POA: Insufficient documentation

## 2013-03-31 DIAGNOSIS — R404 Transient alteration of awareness: Secondary | ICD-10-CM | POA: Insufficient documentation

## 2013-03-31 DIAGNOSIS — Z992 Dependence on renal dialysis: Secondary | ICD-10-CM | POA: Insufficient documentation

## 2013-03-31 DIAGNOSIS — K219 Gastro-esophageal reflux disease without esophagitis: Secondary | ICD-10-CM | POA: Insufficient documentation

## 2013-03-31 DIAGNOSIS — Z86718 Personal history of other venous thrombosis and embolism: Secondary | ICD-10-CM | POA: Insufficient documentation

## 2013-03-31 DIAGNOSIS — I120 Hypertensive chronic kidney disease with stage 5 chronic kidney disease or end stage renal disease: Secondary | ICD-10-CM | POA: Insufficient documentation

## 2013-03-31 DIAGNOSIS — I5032 Chronic diastolic (congestive) heart failure: Secondary | ICD-10-CM | POA: Insufficient documentation

## 2013-03-31 DIAGNOSIS — Z8673 Personal history of transient ischemic attack (TIA), and cerebral infarction without residual deficits: Secondary | ICD-10-CM | POA: Insufficient documentation

## 2013-03-31 DIAGNOSIS — R011 Cardiac murmur, unspecified: Secondary | ICD-10-CM | POA: Insufficient documentation

## 2013-03-31 DIAGNOSIS — Z79899 Other long term (current) drug therapy: Secondary | ICD-10-CM | POA: Insufficient documentation

## 2013-03-31 DIAGNOSIS — I10 Essential (primary) hypertension: Secondary | ICD-10-CM | POA: Insufficient documentation

## 2013-03-31 DIAGNOSIS — M129 Arthropathy, unspecified: Secondary | ICD-10-CM | POA: Insufficient documentation

## 2013-03-31 DIAGNOSIS — Z7901 Long term (current) use of anticoagulants: Secondary | ICD-10-CM | POA: Insufficient documentation

## 2013-03-31 DIAGNOSIS — I469 Cardiac arrest, cause unspecified: Secondary | ICD-10-CM | POA: Insufficient documentation

## 2013-03-31 DIAGNOSIS — E119 Type 2 diabetes mellitus without complications: Secondary | ICD-10-CM | POA: Insufficient documentation

## 2013-03-31 DIAGNOSIS — M109 Gout, unspecified: Secondary | ICD-10-CM | POA: Insufficient documentation

## 2013-03-31 DIAGNOSIS — R141 Gas pain: Secondary | ICD-10-CM | POA: Insufficient documentation

## 2013-03-31 DIAGNOSIS — N186 End stage renal disease: Secondary | ICD-10-CM | POA: Insufficient documentation

## 2013-04-14 ENCOUNTER — Ambulatory Visit: Payer: Medicare Other | Admitting: Vascular Surgery

## 2013-04-15 ENCOUNTER — Ambulatory Visit: Payer: Medicare Other | Admitting: Internal Medicine

## 2013-04-15 NOTE — ED Provider Notes (Addendum)
I saw and evaluated the patient, reviewed the resident's note and I agree with the findings and plan.  Pt with cardiac arrest at Dialysis center this morning, had not yet had dialysis. ACLS protocol initiated by EMS with no ROSC. Spoke with Dr. Kathrene Bongo prior to patient arrival who was at the facility and had spoken to family who were aware and did not want any heroic measures. Asystole with no cardiac motion on arrival. CPR stopped. Time of death 36  Costantino Kohlbeck B. Bernette Mayers, MD 30-Apr-2013 1255  Addendum: I was present during the cardiac ultrasound. See findings listed above, no cardiac motion.   Sameka Bagent B. Bernette Mayers, MD 04/08/13 [phone]

## 2013-04-15 NOTE — ED Notes (Signed)
Pt came from hemodialysis. Reported to staff there that she did not feel well, went to bathroom where she was found unresponsive on floor. Per EMS, CPR x 40 min prior to ED arrival. Given by EMS - epi x6, sodium bicarb x1, calcium x1, D50 x1, IO & king airway placed

## 2013-04-15 NOTE — Code Documentation (Signed)
No cardiac activity seen by ultrasound.

## 2013-04-15 NOTE — Code Documentation (Signed)
ED MD with bedside ultrasound

## 2013-04-15 NOTE — Code Documentation (Signed)
Family updated as to patient's status.

## 2013-04-15 NOTE — Code Documentation (Signed)
Family & chaplain at beside. Family given emotional support.

## 2013-04-15 NOTE — ED Provider Notes (Signed)
CSN: [id number]     Arrival date & time 03-Apr-2013  1154 History   First MD Initiated Contact with Patient 2013/04/03 1159     Chief Complaint  Patient presents with  . Cardiac Arrest   (Consider location/radiation/quality/duration/timing/severity/associated sxs/prior Treatment) Patient is a 79 y.o. female presenting with general illness. The history is provided by the EMS personnel. No language interpreter was used.  Illness Location:  Dialysis Quality:  Unwitnessed arrest at Dialysis Severity:  Severe Onset quality:  Sudden Duration: prior to arrival. Timing:  Constant Progression:  Unchanged Chronicity:  New Context:  Was not feeling well, unwitnessed arrest at Dialysis Relieved by:  Nothing Worsened by:  Nothing Ineffective treatments:  6 rounds of EPI, 1g Calcium gluconate, 1 amp bicarb Associated symptoms: loss of consciousness   Associated symptoms: no fever and no shortness of breath   Risk factors:  ESRD, TIA, AI, DM, HTN   Past Medical History  Diagnosis Date  . Hypertension   . Venous stasis of lower extremity WEARS TEDS    BLE--  CURRENTLY NO ULCERS/ WOUNDS  . Aortic insufficiency     mild/H&P 04/02/2012  . Type II diabetes mellitus   . Pulmonary hypertension per last echo 03-28-2012  mod.-severe     per dr wert note this related to L ht pressures from diastolic chf  . Hypertrophic obstructive cardiomyopathy(425.11)   . Arthritis   . CKD (chronic kidney disease) stage 3, GFR 30-59 ml/min   . Heart murmur   . History of DVT of lower extremity lower right leg nov 2012  . Hyperlipidemia   . History of gout 06-09-2012  per pt stable  . Chronic diastolic CHF (congestive heart failure), NYHA class 3 CARDIOLOGIST- DR HILTY--  LOV OCT 2013--  REQUESTED NOTE, EGK AND STRESS TEST    PER PT ON 06-09-2012  S&S OF CHF AND WT IS DOWN  . H/O hiatal hernia   . GERD (gastroesophageal reflux disease)   . Lower extremity edema   . PAF (paroxysmal atrial fibrillation), in SR on  admit 09/25/2012  . TIA (transient ischemic attack) 08/07/2012    2D Echo - EF 60-65, severe concentric hypertrophy in the left ventricle  . Dyspnea on exertion 11/30/2010    Gated Stress Dipyridamole Myocardial Perfusion - post stress EF-63, normal study, no ischemia demonstrated   . Hematoma, lt upper arm 01/23/2013  . OSA on CPAP   . Stroke Jan 2014    Rt brain, Nl CA doppler   Past Surgical History  Procedure Laterality Date  . Total knee arthroplasty  1988; 1998 X2    right; bilateral  . Tumor excision  1970's    "off my stomach"  . Cataract extraction w/ intraocular lens implant  ~ 2010    right; "put lens in; didn't work; had mass on my eye"  . Transthoracic echocardiogram  04-03-2012   DR KENNETH HILTY    LVSF  EF 80-85%/ HYPERTRONIC CARDIOMYOPATHY WITH MOSTLY MID-CAVITARY OBSTRUCTION AND A SMALLER COMPONENT OF LV OUTFLOW TRACT OBSTRUCTION/   EVIDENCE OF SEVERE DECOMPRESATION OF DIASTOLIC HEART FAILURE AND MODERATE TO SEVERE PULMONARY ARTERIAL HYPERTENSION  . Joint replacement  '88 and '98    TOTAL RIGHT KNEE X2  1988  &  1998 (DUE TO JOINT MALFUNCTION)   TOTAL LEFT KNEE 1998  . Carpometacarpel suspension plasty  06/16/2012    Procedure: CARPOMETACARPEL [facility]) SUSPENSION PLASTY;  Surgeon: Jodi Marble, MD;  Location: [facility];  Service: Orthopedics;  Laterality: Right;  RIGHT THUMB SUSPENSION ARTHROPLASTY.  Marland Kitchen Eye surgery Right ~ 2011    "not a cataract; just a growth" (09/26/2012)  . Cardiac catheterization Bilateral 01/19/2004    NL cors. neg Nuc 2012  . Av fistula placement N/A 01/12/2013    Procedure: ARTERIOVENOUS (AV) FISTULA CREATION;  Surgeon: Larina Earthly, MD;  Location: [facility] OR;  Service: Vascular;  Laterality: N/A;  . Insertion of dialysis catheter N/A 01/12/2013    Procedure: INSERTION OF DIALYSIS CATHETER;  Surgeon: Larina Earthly, MD;  Location: [facility] OR;  Service: Vascular;  Laterality: N/A;   Family History  Problem Relation Age of Onset  . Other Mother    . Coronary artery disease Father     sudden death in his 56's  . Diabetes Father   . Cancer Father   . Diabetes Sister    History  Substance Use Topics  . Smoking status: Never Smoker   . Smokeless tobacco: Never Used  . Alcohol Use: No   OB History   Grav Para Term Preterm Abortions TAB SAB Ect Mult Living                 Review of Systems  Unable to perform ROS: Acuity of condition  Constitutional: Negative for fever.  Respiratory: Negative for shortness of breath.   Neurological: Positive for loss of consciousness.    Allergies  Review of patient's allergies indicates no known allergies.  Home Medications   Current Outpatient Rx  Name  Route  Sig  Dispense  Refill  . acetaminophen (TYLENOL) 325 MG tablet   Oral   Take 650 mg by mouth every 4 (four) hours as needed for pain.         Marland Kitchen allopurinol (ZYLOPRIM) 300 MG tablet   Oral   Take 150 mg by mouth daily.         Marland Kitchen ALPRAZolam (XANAX) 0.25 MG tablet   Oral   Take 1 tablet (0.25 mg total) by mouth 2 (two) times daily as needed for anxiety.   30 tablet   0   . darbepoetin (ARANESP) 200 MCG/0.4ML SOLN   Intravenous   Inject 0.4 mLs (200 mcg total) into the vein every Thursday with hemodialysis.   1.68 mL      . Emollient (HYDROPHOR) OINT   Apply externally   Apply 1 application topically See admin instructions. Apply thin layer after washing face until shingles resolved         . feeding supplement (PRO-STAT SUGAR FREE 64) LIQD   Oral   Take 30 mLs by mouth 2 (two) times daily.         . feeding supplement (RESOURCE BREEZE) LIQD   Oral   Take 1 Container by mouth 2 (two) times daily between meals.      0   . insulin aspart protamine- aspart (NOVOLOG MIX 70/30) (70-30) 100 UNIT/ML injection   Subcutaneous   Inject 5-10 Units into the skin 2 (two) times daily with a meal. Inject 10 units with breakfast and 5 units with supper         . insulin lispro (HUMALOG) 100 UNIT/ML injection    Subcutaneous   Inject 2-5 Units into the skin 3 (three) times daily before meals. Sliding scale         . loperamide (IMODIUM) 2 MG capsule   Oral   Take 1 capsule (2 mg total) by mouth as needed for diarrhea or loose stools.   30 capsule   0   .  Multiple Vitamins-Minerals (CERTA PLUS) TABS   Oral   Take 1 tablet by mouth at bedtime.         Marland Kitchen nystatin cream (MYCOSTATIN)   Topical   Apply 1 application topically 2 (two) times daily.         Marland Kitchen omeprazole (PRILOSEC) 20 MG capsule   Oral   Take 20 mg by mouth daily.         Marland Kitchen oxyCODONE (OXY IR/ROXICODONE) 5 MG immediate release tablet      Take one tablet by mouth every 2 hours as needed for mild pain; take 2 tablets by mouth every 2 hours as needed for moderate pain.   360 tablet   0   . phenytoin (DILANTIN) 100 MG ER capsule   Oral   Take 300 mg by mouth at bedtime.         . polymixin-bacitracin (POLYSPORIN) 500-10000 UNIT/GM OINT ointment   Topical   Apply 1 application topically 2 (two) times daily. Over forehead   30 g   0   . senna-docusate (SENOKOT-S) 8.6-50 MG per tablet   Oral   Take 1 tablet by mouth at bedtime as needed for constipation.         . simvastatin (ZOCOR) 10 MG tablet   Oral   Take 10 mg by mouth at bedtime.         . sodium chloride (MURO 128) 2 % ophthalmic solution   Right Eye   Place 2 drops into the right eye 3 (three) times daily.         . Vancomycin (VANCOCIN) 750 MG/150ML SOLN   Intravenous   Inject 150 mLs (750 mg total) into the vein Every Tuesday,Thursday,and Saturday with dialysis.   10 mL   0     Until 8/10   . warfarin (JANTOVEN) 3 MG tablet   Oral   Take 1.5 mg by mouth daily.          Wt 200 lb (90.719 kg)  BMI 32.3 kg/m2 Physical Exam  Nursing note and vitals reviewed. HENT:  Unresponsive, [place] Airway in place  Eyes:  Fixed and dilated  Cardiovascular:  No pulses, no movement on bedside US  Pulmonary/Chest:  Bilateral breath sounds with  King airway, course breath sounds  Abdominal: She exhibits distension.  Musculoskeletal:  Not moving extremiteis  Neurological:  unresponsive  Skin: Skin is warm.  Psychiatric:  unresponsive    ED Course  Procedures (including critical care time) Labs Review Labs Reviewed - No data to display Imaging Review No results found.  MDM   1. Cardiac arrest    12:08 PM Pt is a 79 y.o. female with pertinent PMHX of ESRD T/Th/Sat, HOCM, DM, HTN, Aortic insufficiency, GERD, TIA who presents to the ED with PEA arrest at Dialysis. Per EMS pt coded with 6 rounds of Ep1, 1G Calcium and 1 amp bicarb. Pt found unresponsive in the bathroom at Dialysis. Upon arrival [facility] Airway in place with bilateral breath sounds and good color change.   Upon pulse check no pulse was obtained. Pt was still in PEA, no JVD, clear breath sounds. No cardiac movement noted on bedside echo, no pericardial effusion. Calcium and bicarb given. BS within normal limits. The decision was made amongst the treatment team to cease CPR. Time of death 11:59AM.  Collateral per family, no recent illness, fevers chest pain. Generalized weakness for the past day.  Family was informed and request to see pt. ME was not contacted.  Pt was discussed with my attending, Dr. Cedric Fishman, MD 04-14-2013 [phone]

## 2013-04-15 NOTE — Progress Notes (Signed)
Patient reported to Ed  as a CPR and died shortly after arrival.  Provided emotional and spiritual support to husband and sister- in - Social worker.  Escorted family to bedside .  Prayed  And remained with them until the departure.  15-Apr-2013 1200  Clinical Encounter Type  Visited With Patient;Family;Health care provider  Visit Type Spiritual support;Death;ED  Referral From Nurse  Spiritual Encounters  Spiritual Needs Prayer;Emotional;Grief support  Stress Factors  Family Stress Factors Loss  [name], [phone]

## 2013-04-15 NOTE — Code Documentation (Signed)
Patient time of death occurred at 40

## 2013-04-15 NOTE — ED Notes (Signed)
CPR in progress. 

## 2013-04-15 DEATH — deceased

## 2013-07-16 NOTE — Progress Notes (Signed)
Date of Visit 03/11/2013 [facility] Rm 1008  Patient ID: Leafy HalfMary T Gabriel, female   DOB: 12/31/1933, 80 y.o.   MRN: [id number]  No Known Allergies  Chief Complaint  Patient presents with  . Acute Visit   HPI:  Pt with long history of Atrial fibrillation, DVT, and s/p CVA on long-term anticoagulant therapy.  Current Coumadin dose is 1 mg Monday, Wednesday, and Friday, and 1.5 mg Tuesday, Thursday, Saturday, and Sunday.    Past Medical History  Diagnosis Date  . Hypertension   . Venous stasis of lower extremity WEARS TEDS    BLE--  CURRENTLY NO ULCERS/ WOUNDS  . Aortic insufficiency     mild/H&P 04/02/2012  . Type II diabetes mellitus   . Pulmonary hypertension per last echo 03-28-2012  mod.-severe     per dr wert note this related to L ht pressures from diastolic chf  . Hypertrophic obstructive cardiomyopathy(425.11)   . Arthritis   . CKD (chronic kidney disease) stage 3, GFR 30-59 ml/min   . Heart murmur   . History of DVT of lower extremity lower right leg nov 2012  . Hyperlipidemia   . History of gout 06-09-2012  per pt stable  . Chronic diastolic CHF (congestive heart failure), NYHA class 3 CARDIOLOGIST- DR HILTY--  LOV OCT 2013--  REQUESTED NOTE, EGK AND STRESS TEST    PER PT ON 06-09-2012  S&S OF CHF AND WT IS DOWN  . H/O hiatal hernia   . GERD (gastroesophageal reflux disease)   . Lower extremity edema   . PAF (paroxysmal atrial fibrillation), in SR on admit 09/25/2012  . TIA (transient ischemic attack) 08/07/2012    2D Echo - EF 60-65, severe concentric hypertrophy in the left ventricle  . Dyspnea on exertion 11/30/2010    Gated Stress Dipyridamole Myocardial Perfusion - post stress EF-63, normal study, no ischemia demonstrated   . Hematoma, lt upper arm 01/23/2013  . OSA on CPAP   . Stroke Jan 2014    Rt brain, Nl CA doppler    Current Outpatient Prescriptions on File Prior to Visit  Medication Sig Dispense Refill  . acetaminophen (TYLENOL) 325 MG tablet Take 650  mg by mouth every 4 (four) hours as needed for pain.      Marland Kitchen. allopurinol (ZYLOPRIM) 300 MG tablet Take 150 mg by mouth daily.      Marland Kitchen. ALPRAZolam (XANAX) 0.25 MG tablet Take 1 tablet (0.25 mg total) by mouth 2 (two) times daily as needed for anxiety.  30 tablet  0  . darbepoetin (ARANESP) 200 MCG/0.4ML SOLN Inject 0.4 mLs (200 mcg total) into the vein every Thursday with hemodialysis.  1.68 mL    . Emollient (HYDROPHOR) OINT Apply 1 application topically See admin instructions. Apply thin layer after washing face until shingles resolved      . feeding supplement (PRO-STAT SUGAR FREE 64) LIQD Take 30 mLs by mouth 2 (two) times daily.      . feeding supplement (RESOURCE BREEZE) LIQD Take 1 Container by mouth 2 (two) times daily between meals.    0  . insulin aspart protamine- aspart (NOVOLOG MIX 70/30) (70-30) 100 UNIT/ML injection Inject 5-10 Units into the skin 2 (two) times daily with a meal. Inject 10 units with breakfast and 5 units with supper      . insulin lispro (HUMALOG) 100 UNIT/ML injection Inject 2-5 Units into the skin 3 (three) times daily before meals. Sliding scale      . loperamide (  IMODIUM) 2 MG capsule Take 1 capsule (2 mg total) by mouth as needed for diarrhea or loose stools.  30 capsule  0  . Multiple Vitamins-Minerals (CERTA PLUS) TABS Take 1 tablet by mouth at bedtime.      Marland Kitchen nystatin cream (MYCOSTATIN) Apply 1 application topically 2 (two) times daily.      Marland Kitchen omeprazole (PRILOSEC) 20 MG capsule Take 20 mg by mouth daily.      Marland Kitchen oxyCODONE (OXY IR/ROXICODONE) 5 MG immediate release tablet Take one tablet by mouth every 2 hours as needed for mild pain; take 2 tablets by mouth every 2 hours as needed for moderate pain.  360 tablet  0  . phenytoin (DILANTIN) 100 MG ER capsule Take 300 mg by mouth at bedtime.      . polymixin-bacitracin (POLYSPORIN) 500-10000 UNIT/GM OINT ointment Apply 1 application topically 2 (two) times daily. Over forehead  30 g  0  . senna-docusate (SENOKOT-S)  8.6-50 MG per tablet Take 1 tablet by mouth at bedtime as needed for constipation.      . simvastatin (ZOCOR) 10 MG tablet Take 10 mg by mouth at bedtime.      . sodium chloride (MURO 128) 2 % ophthalmic solution Place 2 drops into the right eye 3 (three) times daily.      . Vancomycin (VANCOCIN) 750 MG/150ML SOLN Inject 150 mLs (750 mg total) into the vein Every Tuesday,Thursday,and Saturday with dialysis.  10 mL  0  . warfarin (JANTOVEN) 3 MG tablet Take 1.5 mg by mouth daily.       No current facility-administered medications on file prior to visit.    Recent Labs   01/05/13 0500    01/06/13 0420    01/22/13 1118  01/23/13 0533  02/10/13 1818  02/11/13 1410  02/12/13 0757   NA  136   < >  137   < >  137  137  134*  135  139   K  4.7   < >  4.5   < >  4.0  3.4*  5.3*  4.1  3.5   CL  103   < >  102   < >  100  101  99  98  101   CO2  27   < >  27   < >  28  28   --   26  27   GLUCOSE  101*   < >  106*   < >  70  116*  102*  92  105*   BUN  19   < >  18   < >  30*  15  42*  35*  13   CREATININE  1.27*   < >  1.14*   < >  2.15*  1.56*  2.50*  2.37*  1.61*   CALCIUM  8.1*   < >  8.6   < >  8.5  8.0*   --   8.3*  8.1*   MG  2.3   --   2.2   --    --    --    --   1.9   --    PHOS  2.5   < >  2.0*   < >  2.8   --    --   4.1  2.8   < > = values in this interval not displayed. Liver Function Tests: CBC:   Recent Labs   12/05/12 1855  12/30/12 1652    02/10/13 1840  02/11/13 1410  02/12/13 0757   WBC  9.2   < >  8.6   < >  10.5  10.0  10.7*   NEUTROABS  7.4   --   6.4   --    --   7.8*   --    HGB  12.0   < >  12.9   < >  12.7  11.9*  12.2   HCT  37.0   < >  38.8   < >  39.4  36.9  37.1   MCV  94.9   < >  93.3   < >  104.2*  103.9*  103.9*   PLT  236   < >  205   < >  326  306  307   < > = values in this interval not displayed. Cardiac Enzymes:   Recent Labs   12/30/12 2115  12/31/12 0440  12/31/12 0930   TROPONINI  <0.30  <0.30  <0.30    03/04/2013 INR 3.3 dose was  held x 1 03/11/2013 INR 2.9   Pt with no new c/o, no report of any bleeding   Assessment/Plan:  Will not change Coumadin dose at this time, 1 mg M, W, F and 1.5 mg Tu, Thur, Sat, and Sun.  Will reassess on 03/20/2013.

## 2013-07-28 NOTE — Progress Notes (Addendum)
Date of Visit 03/27/2013 [facility]  Patient ID: [name], female   DOB: 03/22/1934, 80 y.o.   MRN: [id number]  No Known Allergies  Chief Complaint  Patient presents with  . Discharge Note   HPI:  [facility] which originally began shingles. She was on IV vancomycin which was dosed at her dialysis. She does have end-stage renal disease and receives dialysis 3 times a week.  Pt with long history of Atrial fibrillation, DVT, and s/p CVA on long-term anticoagulant therapy.  Current Coumadin dose is 1 mg Monday, Wednesday, and Friday, and 1.5 mg Tuesday, Thursday, Saturday, and Sunday.    Patient is scheduled to be discharged on 03/28/2013.  Past Medical History  Diagnosis Date  . Hypertension   . Venous stasis of lower extremity WEARS TEDS    BLE--  CURRENTLY NO ULCERS/ WOUNDS  . Aortic insufficiency     mild/H&P 04/02/2012  . Type II diabetes mellitus   . Pulmonary hypertension per last echo 03-28-2012  mod.-severe     per dr wert note this related to L ht pressures from diastolic chf  . Hypertrophic obstructive cardiomyopathy(425.11)   . Arthritis   . CKD (chronic kidney disease) stage 3, GFR 30-59 ml/min   . Heart murmur   . History of DVT of lower extremity lower right leg nov 2012  . Hyperlipidemia   . History of gout 06-09-2012  per pt stable  . Chronic diastolic CHF (congestive heart failure), NYHA class 3 CARDIOLOGIST- DR HILTY--  LOV OCT 2013--  REQUESTED NOTE, EGK AND STRESS TEST    PER PT ON 06-09-2012  S&S OF CHF AND WT IS DOWN  . H/O hiatal hernia   . GERD (gastroesophageal reflux disease)   . Lower extremity edema   . PAF (paroxysmal atrial fibrillation), in SR on admit 09/25/2012  . TIA (transient ischemic attack) 08/07/2012    2D Echo - EF 60-65, severe concentric hypertrophy in the left ventricle  . Dyspnea on exertion 11/30/2010    Gated Stress Dipyridamole  Myocardial Perfusion - post stress EF-63, normal study, no ischemia demonstrated   . Hematoma, lt upper arm 01/23/2013  . OSA on CPAP   . Stroke Jan 2014    Rt brain, Nl CA doppler    Current Outpatient Prescriptions on File Prior to Visit  Medication Sig Dispense Refill  . acetaminophen (TYLENOL) 325 MG tablet Take 650 mg by mouth every 4 (four) hours as needed for pain.      Marland Kitchen. allopurinol (ZYLOPRIM) 300 MG tablet Take 150 mg by mouth daily.      Marland Kitchen. ALPRAZolam (XANAX) 0.25 MG tablet Take 1 tablet (0.25 mg total) by mouth 2 (two) times daily as needed for anxiety.  30 tablet  0  . darbepoetin (ARANESP) 200 MCG/0.4ML SOLN Inject 0.4 mLs (200 mcg total) into the vein every Thursday with hemodialysis.  1.68 mL    . Emollient (HYDROPHOR) OINT Apply 1 application topically See admin instructions. Apply thin layer after washing face until shingles resolved      . feeding supplement (PRO-STAT SUGAR FREE 64) LIQD Take 30 mLs by mouth 2 (two) times daily.      . feeding supplement (RESOURCE BREEZE) LIQD Take 1 Container by mouth 2 (two) times daily between meals.    0  . insulin aspart protamine- aspart (NOVOLOG MIX 70/30) (70-30) 100 UNIT/ML injection Inject 5-10 Units  into the skin 2 (two) times daily with a meal. Inject 10 units with breakfast and 5 units with supper      . insulin lispro (HUMALOG) 100 UNIT/ML injection Inject 2-5 Units into the skin 3 (three) times daily before meals. Sliding scale      . loperamide (IMODIUM) 2 MG capsule Take 1 capsule (2 mg total) by mouth as needed for diarrhea or loose stools.  30 capsule  0  . Multiple Vitamins-Minerals (CERTA PLUS) TABS Take 1 tablet by mouth at bedtime.      Marland Kitchen nystatin cream (MYCOSTATIN) Apply 1 application topically 2 (two) times daily.      Marland Kitchen omeprazole (PRILOSEC) 20 MG capsule Take 20 mg by mouth daily.      Marland Kitchen oxyCODONE (OXY IR/ROXICODONE) 5 MG immediate release tablet Take one tablet by mouth every 2 hours as needed for mild pain; take 2  tablets by mouth every 2 hours as needed for moderate pain.  360 tablet  0  . phenytoin (DILANTIN) 100 MG ER capsule Take 300 mg by mouth at bedtime.      . polymixin-bacitracin (POLYSPORIN) 500-10000 UNIT/GM OINT ointment Apply 1 application topically 2 (two) times daily. Over forehead  30 g  0  . senna-docusate (SENOKOT-S) 8.6-50 MG per tablet Take 1 tablet by mouth at bedtime as needed for constipation.      . simvastatin (ZOCOR) 10 MG tablet Take 10 mg by mouth at bedtime.      . sodium chloride (MURO 128) 2 % ophthalmic solution Place 2 drops into the right eye 3 (three) times daily.      . Vancomycin (VANCOCIN) 750 MG/150ML SOLN Inject 150 mLs (750 mg total) into the vein Every Tuesday,Thursday,and Saturday with dialysis.  10 mL  0  . warfarin (JANTOVEN) 3 MG tablet Take 1.5 mg by mouth daily.       No current facility-administered medications on file prior to visit.    Recent Labs   01/05/13 0500    01/06/13 0420    01/22/13 1118  01/23/13 0533  02/10/13 1818  02/11/13 1410  02/12/13 0757   NA  136   < >  137   < >  137  137  134*  135  139   K  4.7   < >  4.5   < >  4.0  3.4*  5.3*  4.1  3.5   CL  103   < >  102   < >  100  101  99  98  101   CO2  27   < >  27   < >  28  28   --   26  27   GLUCOSE  101*   < >  106*   < >  70  116*  102*  92  105*   BUN  19   < >  18   < >  30*  15  42*  35*  13   CREATININE  1.27*   < >  1.14*   < >  2.15*  1.56*  2.50*  2.37*  1.61*   CALCIUM  8.1*   < >  8.6   < >  8.5  8.0*   --   8.3*  8.1*   MG  2.3   --   2.2   --    --    --    --   1.9   --  PHOS  2.5   < >  2.0*   < >  2.8   --    --   4.1  2.8   < > = values in this interval not displayed. Liver Function Tests: CBC:   Recent Labs   12/05/12 1855    12/30/12 1652    02/10/13 1840  02/11/13 1410  02/12/13 0757   WBC  9.2   < >  8.6   < >  10.5  10.0  10.7*   NEUTROABS  7.4   --   6.4   --    --   7.8*   --    HGB  12.0   < >  12.9   < >  12.7  11.9*  12.2   HCT  37.0   <  >  38.8   < >  39.4  36.9  37.1   MCV  94.9   < >  93.3   < >  104.2*  103.9*  103.9*   PLT  236   < >  205   < >  326  306  307   < > = values in this interval not displayed. Cardiac Enzymes:   Recent Labs   12/30/12 2115  12/31/12 0440  12/31/12 0930   TROPONINI  <0.30  <0.30  <0.30    03/04/2013 INR 3.3 dose was held x 1 03/11/2013 INR 2.9   Pt with no new c/o, no report of any bleeding   Vital Signs  Blood pressure 108/60 Pulse oximetry 96% Respiratory rate 18 Pulse is 76  Physical examination  Patient is alert, verbally appropriate, oriented x3 appears fatigued but otherwise unremarkable.  Head is normal cephalic she does have some dry skin over her face otherwise unremarkable.  Extraocular movements are intact pupils are equally round and reactive to light  Oropharynx no apparent oral lesions are evident.  No palpable cervical adenopathy or thyromegaly  Apical pulses with a regular rate and rhythm. She does have a port noted on her upper chest, clean and dry. Fistula on her left upper arm is positive for bruit. Approximate 2/6 murmur is best appreciated at the second intercostal space left sternal border  Bilateral breath sounds are clear  Compression wrappings remain place over her lower extremities.  Assessment/Plan:  Patient to be discharged home. She will need a physical/occupational therapist for safety, mobility, strengthening, and ADL adaptation. She will need an RN to assist with management of her diabetes, INRs for Coumadin dosage. She will also need home health aide. She is being discharged with a wheelchair and hospital bed.  Long term anti-coagulant therapy, will not change Coumadin dose at this time, 1 mg M, W, F and 1.5 mg Tu, Thur, Sat, and Sun.  End-stage renal disease, and she will continue with her dialysis 3 times a week  Diabetes, will continue with her NovoLog mix, 70/30, to be injected as per protocol. She will also have Humalog available  for sliding scale.  Anemia associated chronic renal disease, will receive Aranesp 200 mcg 200 mcg/0.4 mL, intravenously each Thursday with dialysis.  Will receive feeding supplement, low carbohydrate as per dialysis.  Anxiety, Xanax 0.25 mg twice a day as needed for anxiety.  Gout, continue allopurinol 300 mg, 150 mg each day.  Acetaminophen for pain, 325 mg, 2 tablets by mouth every 4 hours as needed for pain.  Also has oxycodone immediate release 5 mg every 2 hours as needed for pain,  may take 2 tablets  Supplementation a multivitamin with mineral one tablet at bedtime.  GERD, Prilosec 20 mg each day.  Lipid metabolism disorder will continue Zocor 10 mg at bedtime.  Constipation, continue Senokot S. one tablet at bedtime as needed.   Correct date of visit 03/27/2013  Date of discharge 03/28/2013

## 2013-11-29 IMAGING — CR DG CHEST 2V
2 series · 2 of 2 positions shown · non-contrast
Comparison: PA and lateral chest 10/18/2011.

CLINICAL DATA: Shortness of breath.  Lower extremity swelling.

CHEST - 2 VIEW

[w chest pa]
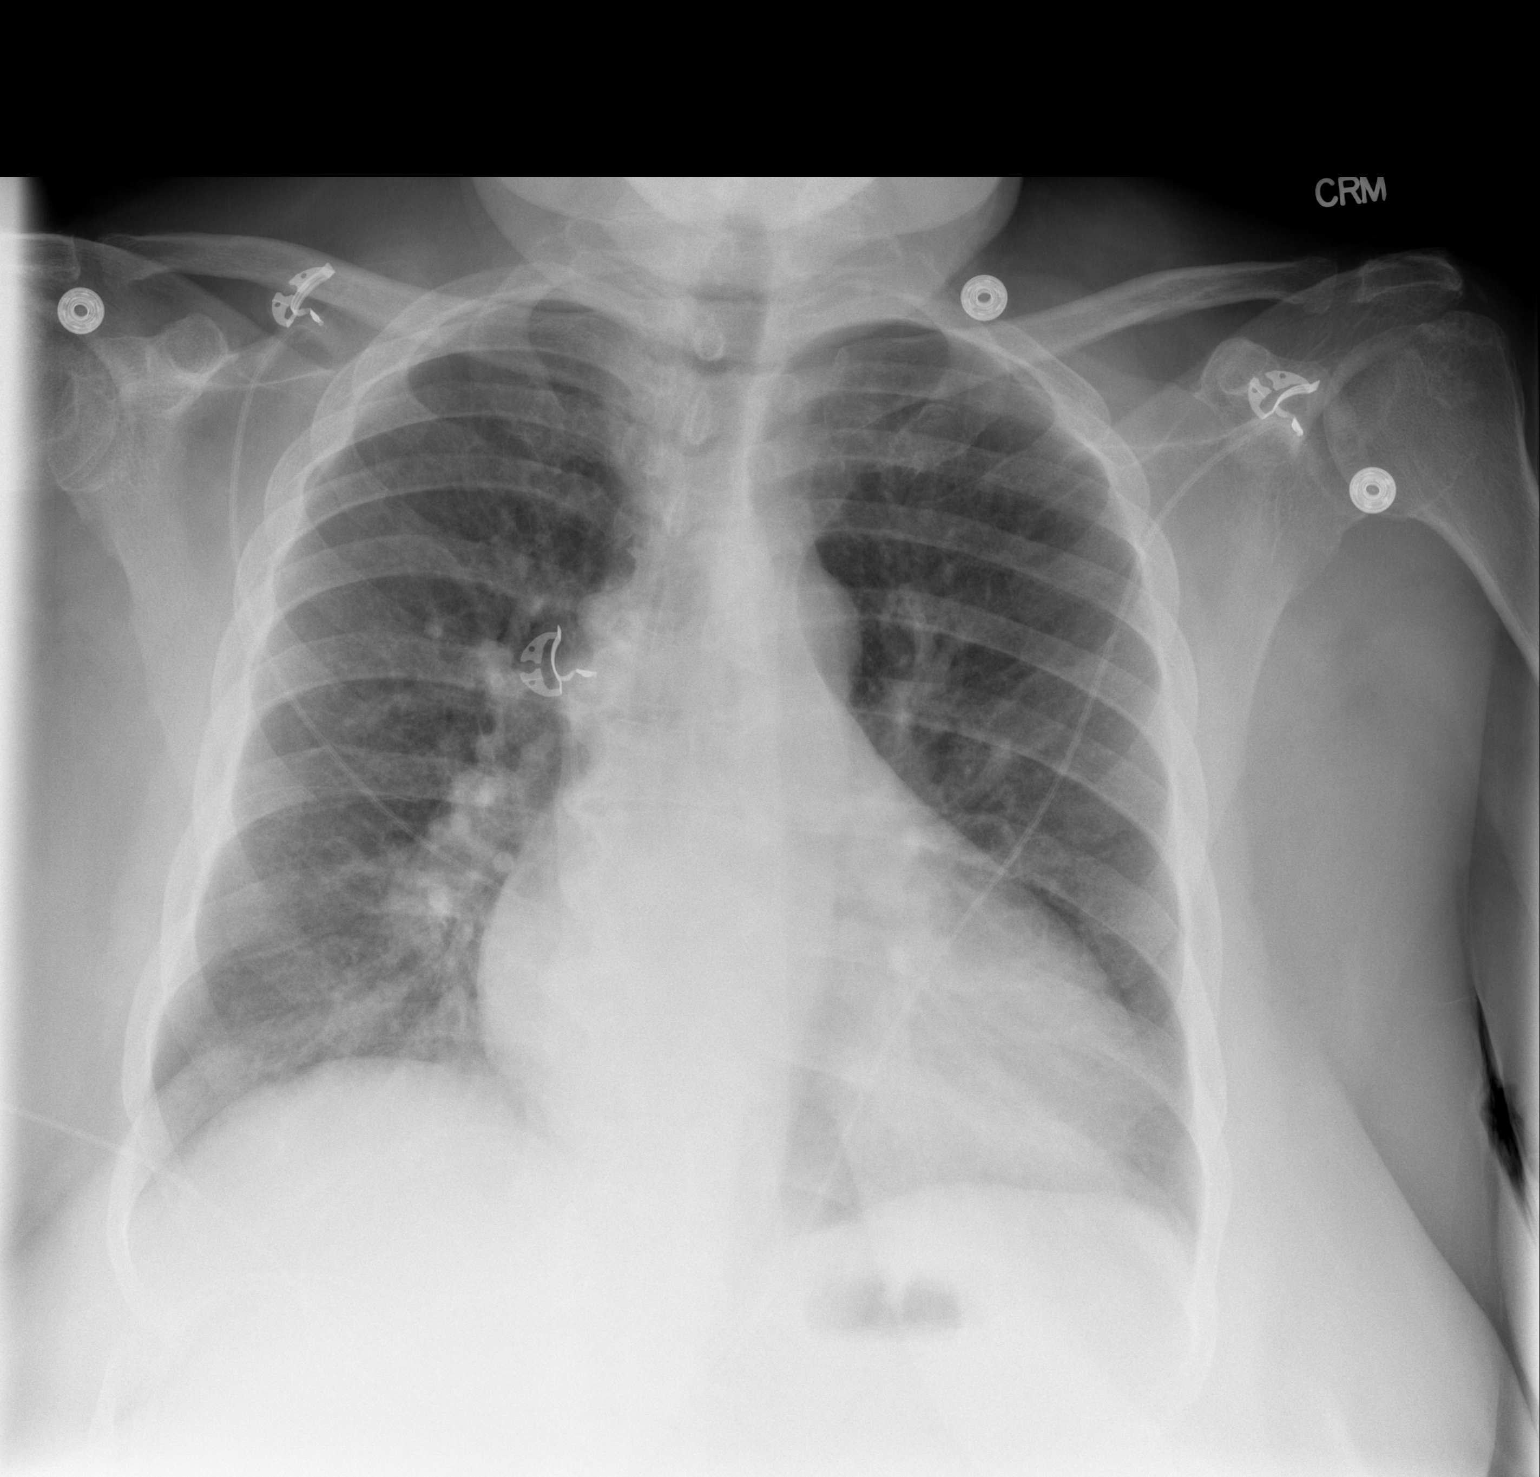

[w chest lat]
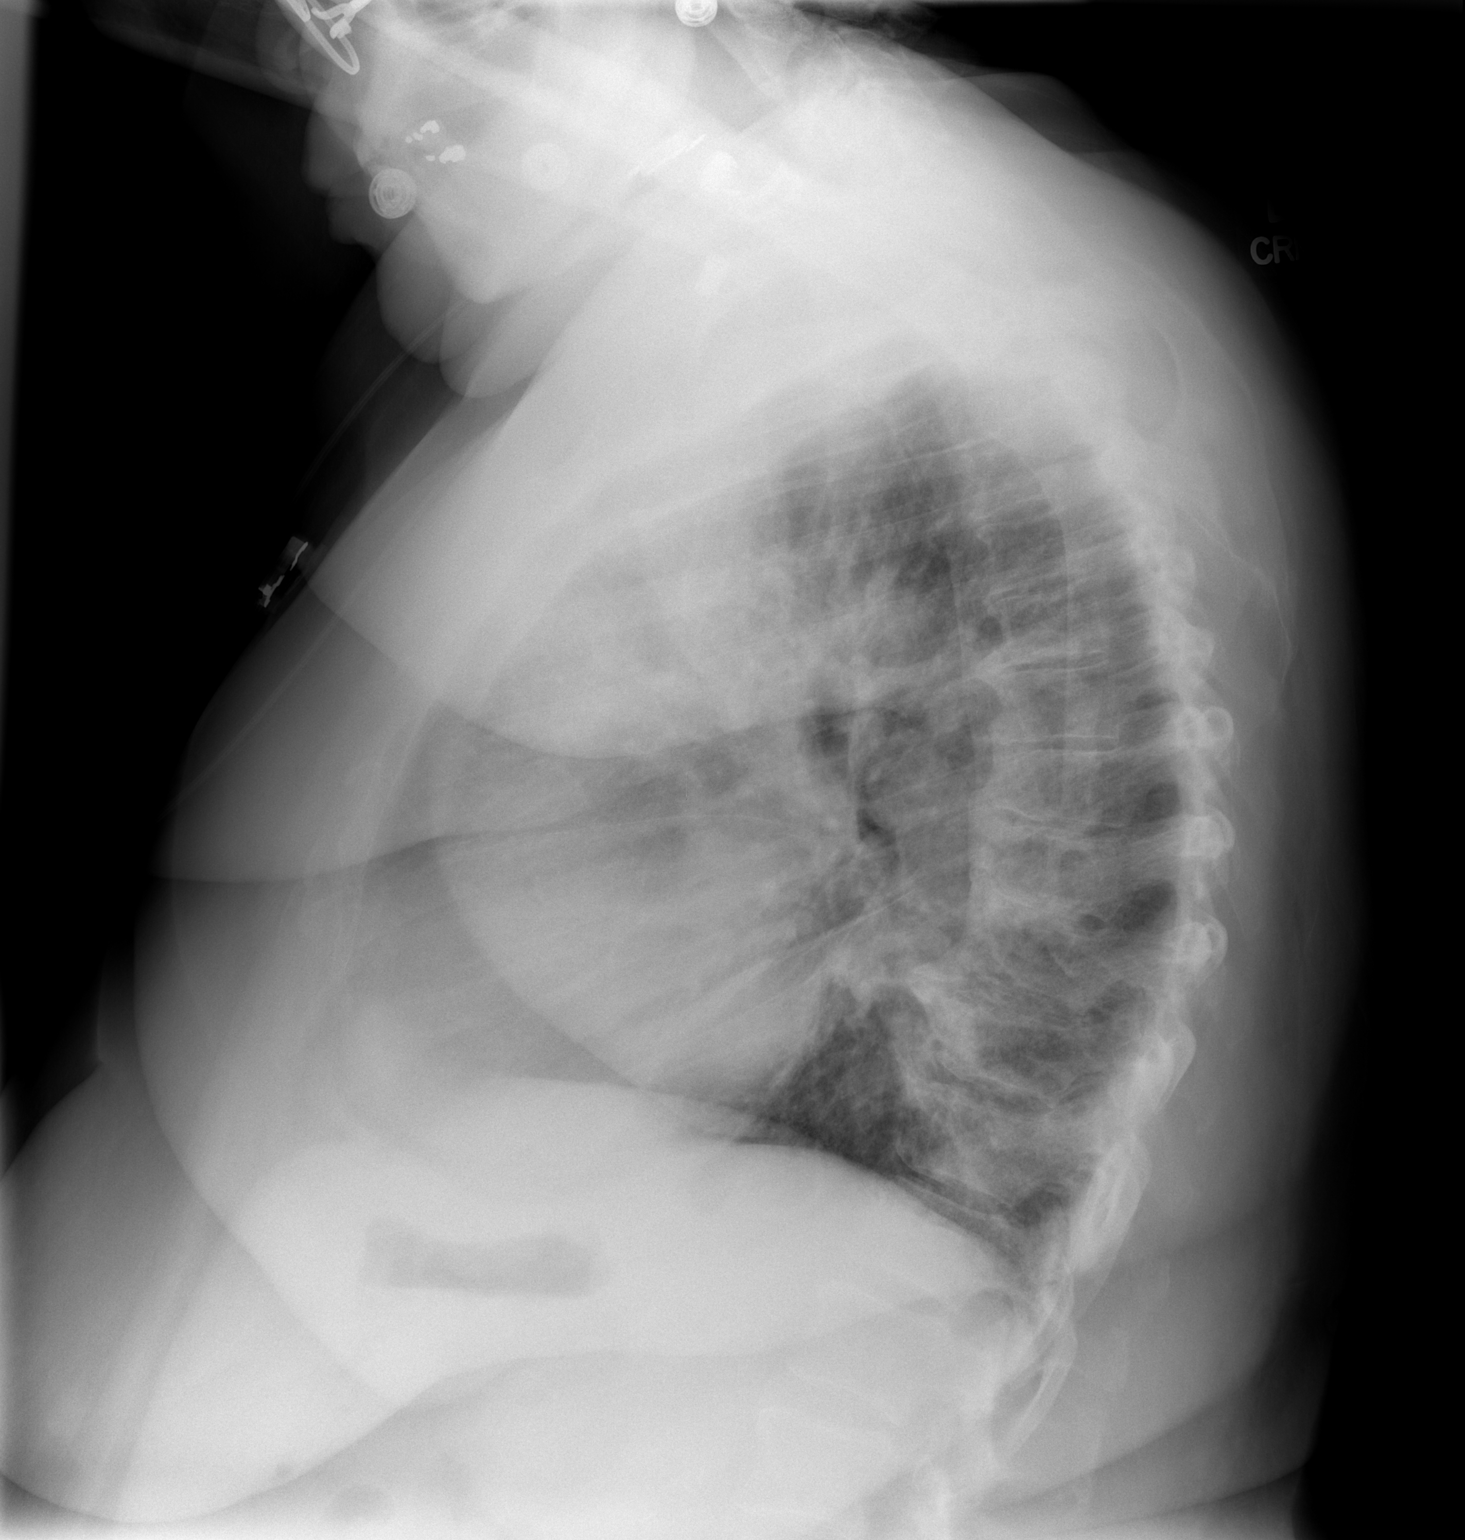

[2 of 2 positions shown; findings below may reference images not displayed]

FINDINGS: Cardiomegaly and vascular congestion appear unchanged.
There is no frank pulmonary edema.  No focal airspace disease or
effusion.  No pneumothorax.
IMPRESSION: Cardiomegaly and pulmonary vascular congestion.  No acute finding.

## 2014-05-24 IMAGING — CR DG CHEST 1V PORT
1 series · 1 of 1 positions shown · non-contrast
Comparison: 08/07/2012

CLINICAL DATA: CHF evaluation.

PORTABLE CHEST - 1 VIEW

[AP]
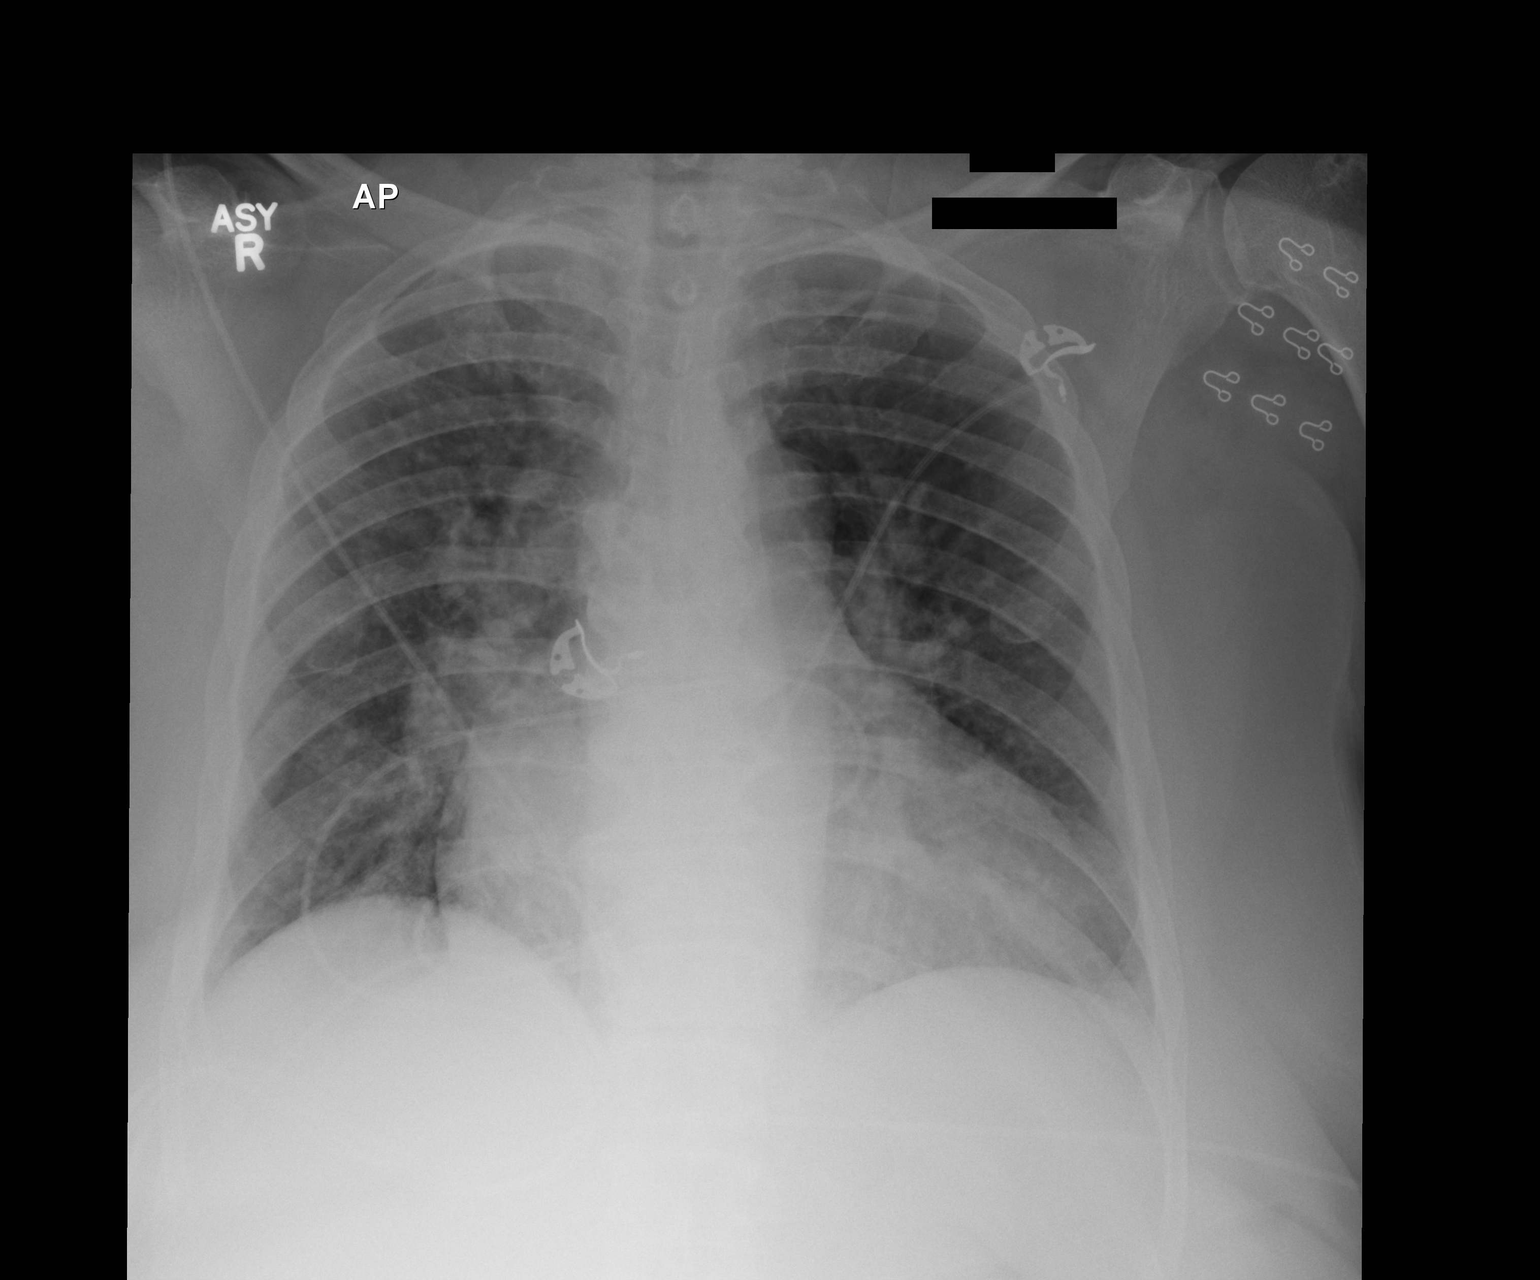

[1 of 1 positions shown; findings below may reference images not displayed]

FINDINGS: Enlargement of the central vascular structures.  Heart
size is upper limits of normal but unchanged.  Trachea is midline.
No focal airspace disease.  No evidence for a pneumothorax.
IMPRESSION: Enlarged central vascular structures suggest vascular congestion or
mild edema.

## 2014-08-28 IMAGING — US IR FLUORO GUIDE CV LINE*R*
1 series · 2 of 2 positions shown · non-contrast
Comparison: none

CLINICAL HISTORY: Acute renal failure and needs hemodialysis.

[Series 1: ir fluoro guide cv line*right* · 2 of 2 slices shown]
[im 1/2]
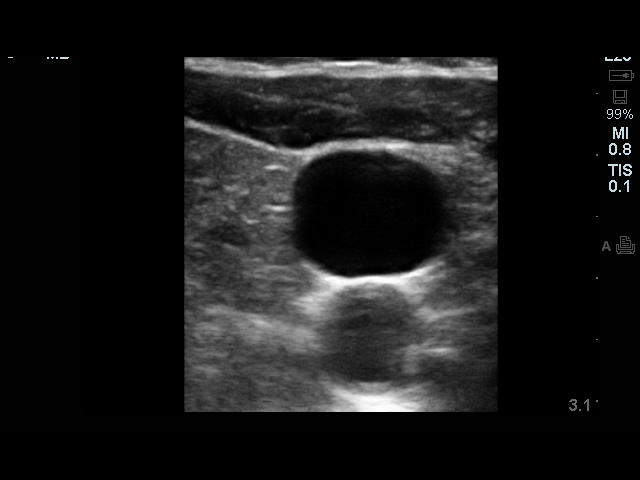
[im 2/2]
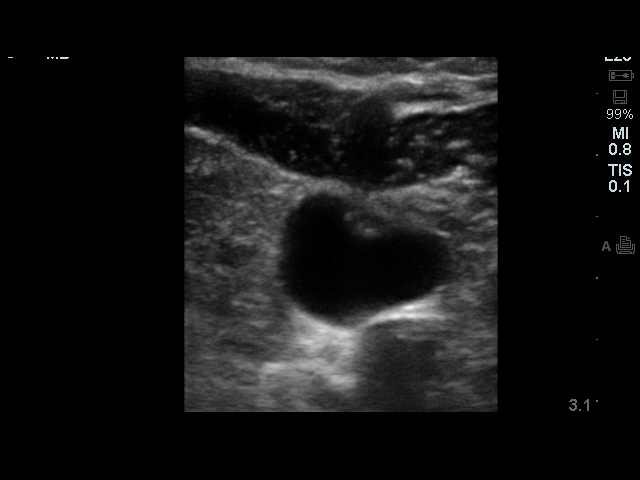

[2 of 2 positions shown; findings below may reference images not displayed]

PROCEDURE(S): PLACEMENT OF A NON TUNNELED DIALYSIS CATHETER WITH
ULTRASOUND AND FLUOROSCOPIC GUIDANCE

Medications:None

Moderate sedation time:None

Fluoroscopy time: 30 seconds

Procedure:The procedure was explained to the patient.  The risks
and benefits of the procedure were discussed and the patient's
questions were addressed.  Informed consent was obtained from the
patient.  Ultrasound demonstrated a patent right internal jugular
vein.  The right side of the neck was prepped and draped in a
sterile fashion.  Maximal barrier sterile technique was utilized
including caps, mask, sterile gowns, sterile gloves, sterile drape,
hand hygiene and skin antiseptic.  The skin was anesthetized with
lidocaine.  21 gauge needle was directed into the right internal
jugular vein with ultrasound guidance.  Micropuncture dilator set
was placed.  The tract was dilated over an Amplatz wire.  A 20 cm
Trialysis catheter was advanced over the Amplatz wire and
positioned at the cavoatrial junction.  The three lumens aspirated
and flushed well.  The dialysis lumens were flushed with the proper
amount of heparin.  Catheter was sutured to the skin. Fluoroscopic
and ultrasound images were taken and saved for documentation.
FINDINGS: Catheter tip at the cavoatrial junction.

Complications: None
IMPRESSION: Placement of a right jugular Trialysis catheter with
fluoroscopic and ultrasound guidance.

## 2014-09-09 IMAGING — CR DG CHEST 1V PORT
1 series · 1 of 1 positions shown · non-contrast
Comparison: 12/30/2012 and earlier.

CLINICAL DATA: 79-year-old female dialysis catheter placement.

PORTABLE CHEST - 1 VIEW

[AP]
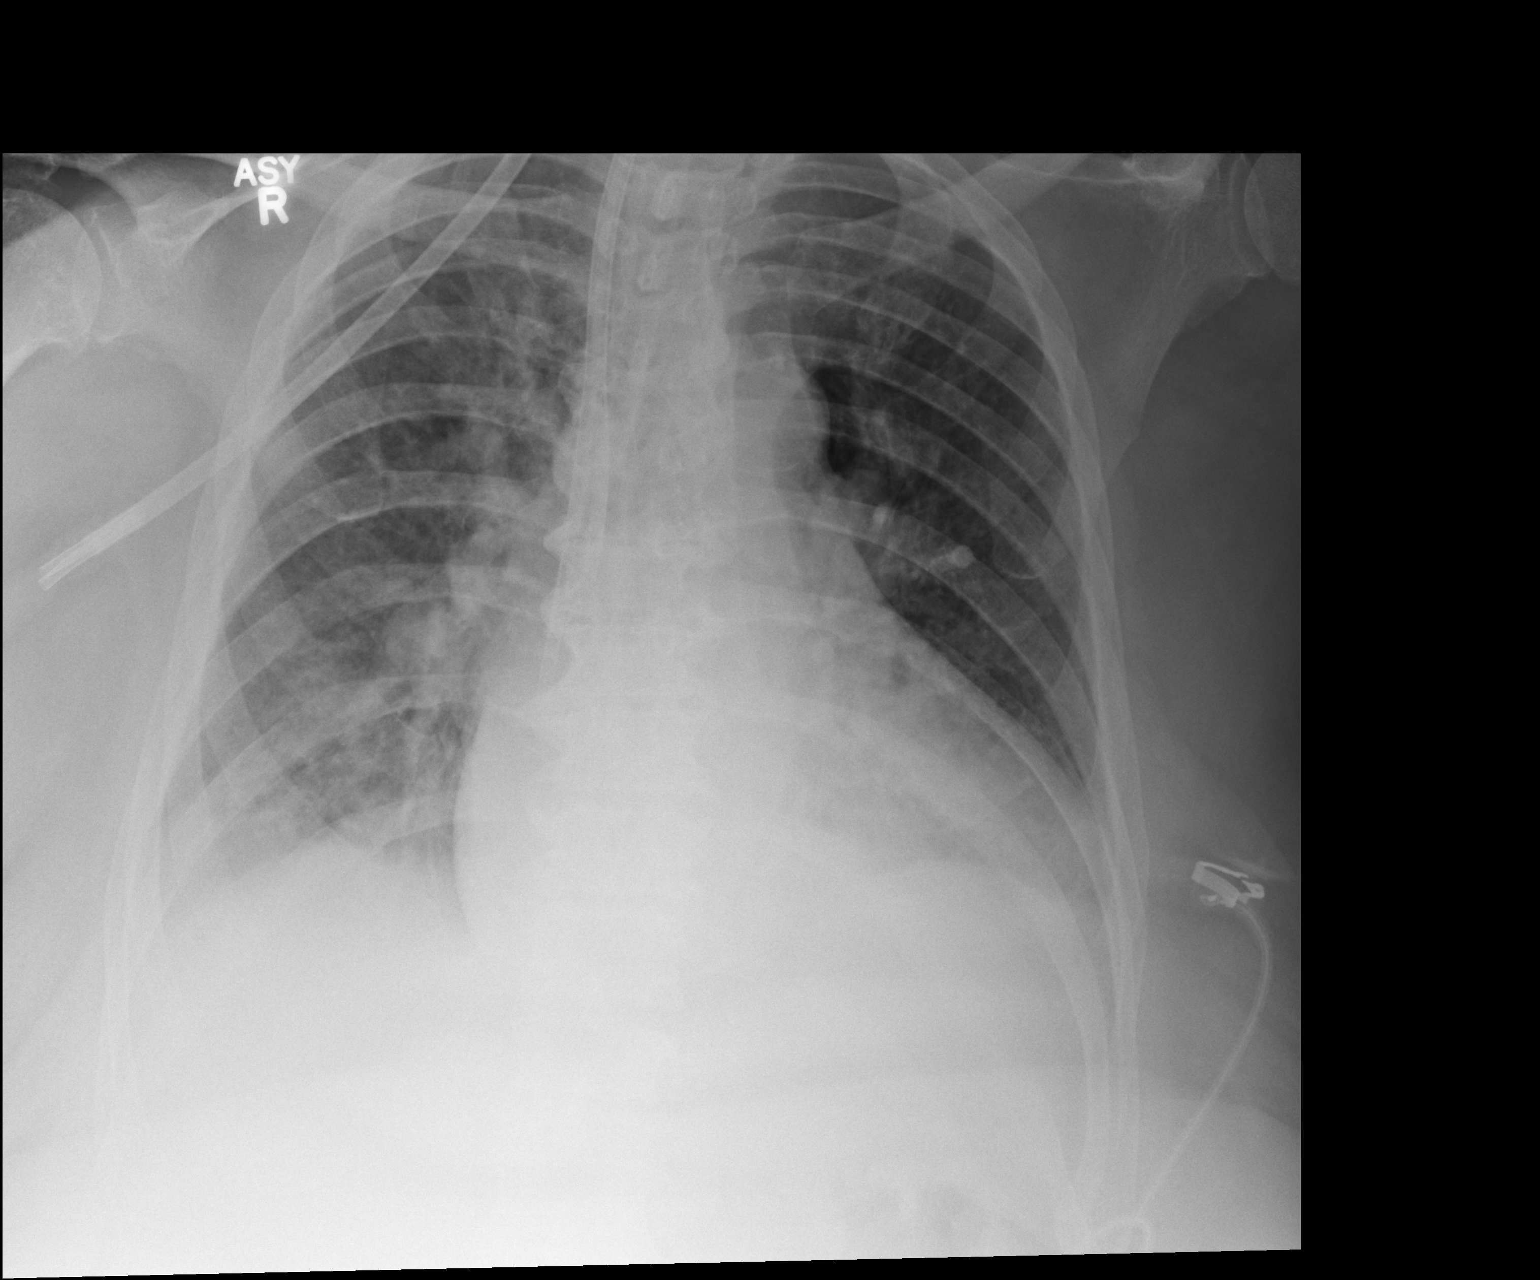

[1 of 1 positions shown; findings below may reference images not displayed]

FINDINGS: Portable AP view at 6698 hours.  Right IJ approach dual
lumen dialysis type catheter in place.  Tips project at the lower
SVC and superior right atrium level.  Lower lung volumes.  The
patient is more rotated to the left.  Increased pulmonary vascular
congestion.  No pneumothorax identified.  Possible small pleural
effusions.
IMPRESSION: 1.  Right IJ dialysis catheter placed with no adverse features.
2.  Increased pulmonary vascular congestion and probable small
pleural effusions.

## 2014-09-16 ENCOUNTER — Encounter: Payer: Self-pay | Admitting: *Deleted

## 2014-09-16 NOTE — Telephone Encounter (Signed)
Opened in error

## 2014-12-22 ENCOUNTER — Encounter: Payer: Self-pay | Admitting: *Deleted

## 2014-12-22 NOTE — Telephone Encounter (Signed)
Encounter opened in error
